# Patient Record
Sex: Female | Born: 1935
Health system: Southern US, Community
[De-identification: ages and names within clinical notes are randomized; demographics above are authoritative.]

## PROBLEM LIST (undated history)

## (undated) DIAGNOSIS — M79606 Pain in leg, unspecified: Secondary | ICD-10-CM

## (undated) DIAGNOSIS — E785 Hyperlipidemia, unspecified: Secondary | ICD-10-CM

## (undated) DIAGNOSIS — M1990 Unspecified osteoarthritis, unspecified site: Secondary | ICD-10-CM

## (undated) DIAGNOSIS — I4891 Unspecified atrial fibrillation: Secondary | ICD-10-CM

## (undated) DIAGNOSIS — I10 Essential (primary) hypertension: Secondary | ICD-10-CM

## (undated) DIAGNOSIS — I82409 Acute embolism and thrombosis of unspecified deep veins of unspecified lower extremity: Secondary | ICD-10-CM

## (undated) DIAGNOSIS — I739 Peripheral vascular disease, unspecified: Secondary | ICD-10-CM

## (undated) DIAGNOSIS — I639 Cerebral infarction, unspecified: Secondary | ICD-10-CM

## (undated) HISTORY — DX: Unspecified atrial fibrillation: I48.91

## (undated) HISTORY — DX: Acute embolism and thrombosis of unspecified deep veins of unspecified lower extremity: I82.409

## (undated) HISTORY — PX: EYE SURGERY: SHX253

## (undated) HISTORY — PX: SPINE SURGERY: SHX786

## (undated) HISTORY — DX: Pain in leg, unspecified: M79.606

## (undated) HISTORY — DX: Cerebral infarction, unspecified: I63.9

## (undated) HISTORY — DX: Hyperlipidemia, unspecified: E78.5

## (undated) HISTORY — DX: Essential (primary) hypertension: I10

## (undated) HISTORY — PX: LUMBAR DISC SURGERY: SHX700

## (undated) HISTORY — DX: Peripheral vascular disease, unspecified: I73.9

---

## 1994-04-15 HISTORY — PX: BACK SURGERY: SHX140

## 1997-04-15 DIAGNOSIS — I639 Cerebral infarction, unspecified: Secondary | ICD-10-CM

## 1997-04-15 HISTORY — DX: Cerebral infarction, unspecified: I63.9

## 1997-11-03 ENCOUNTER — Emergency Department (HOSPITAL_COMMUNITY): Admission: EM | Admit: 1997-11-03 | Discharge: 1997-11-03 | Payer: Self-pay | Admitting: Emergency Medicine

## 1998-04-15 HISTORY — PX: CATARACT EXTRACTION: SUR2

## 1998-08-29 ENCOUNTER — Other Ambulatory Visit: Admission: RE | Admit: 1998-08-29 | Discharge: 1998-08-29 | Payer: Self-pay | Admitting: Family Medicine

## 1999-08-20 ENCOUNTER — Other Ambulatory Visit: Admission: RE | Admit: 1999-08-20 | Discharge: 1999-08-20 | Payer: Self-pay | Admitting: Family Medicine

## 2000-09-17 ENCOUNTER — Other Ambulatory Visit: Admission: RE | Admit: 2000-09-17 | Discharge: 2000-09-17 | Payer: Self-pay | Admitting: Family Medicine

## 2001-10-22 ENCOUNTER — Other Ambulatory Visit: Admission: RE | Admit: 2001-10-22 | Discharge: 2001-10-22 | Payer: Self-pay | Admitting: Family Medicine

## 2001-10-22 ENCOUNTER — Other Ambulatory Visit: Admission: RE | Admit: 2001-10-22 | Discharge: 2001-10-22 | Payer: Self-pay | Admitting: Anesthesiology

## 2001-10-25 ENCOUNTER — Emergency Department (HOSPITAL_COMMUNITY): Admission: EM | Admit: 2001-10-25 | Discharge: 2001-10-25 | Payer: Self-pay

## 2001-10-25 ENCOUNTER — Encounter: Payer: Self-pay | Admitting: Emergency Medicine

## 2003-02-14 ENCOUNTER — Inpatient Hospital Stay (HOSPITAL_COMMUNITY): Admission: EM | Admit: 2003-02-14 | Discharge: 2003-02-16 | Payer: Self-pay | Admitting: Emergency Medicine

## 2003-02-15 ENCOUNTER — Encounter (INDEPENDENT_AMBULATORY_CARE_PROVIDER_SITE_OTHER): Payer: Self-pay | Admitting: Cardiology

## 2003-11-01 ENCOUNTER — Encounter: Payer: Self-pay | Admitting: Cardiovascular Disease

## 2003-11-01 ENCOUNTER — Inpatient Hospital Stay (HOSPITAL_COMMUNITY): Admission: EM | Admit: 2003-11-01 | Discharge: 2003-11-07 | Payer: Self-pay | Admitting: Emergency Medicine

## 2004-02-22 ENCOUNTER — Ambulatory Visit: Payer: Self-pay | Admitting: *Deleted

## 2004-04-19 ENCOUNTER — Ambulatory Visit (HOSPITAL_COMMUNITY): Admission: RE | Admit: 2004-04-19 | Discharge: 2004-04-19 | Payer: Self-pay | Admitting: Gastroenterology

## 2004-04-19 ENCOUNTER — Encounter (INDEPENDENT_AMBULATORY_CARE_PROVIDER_SITE_OTHER): Payer: Self-pay | Admitting: *Deleted

## 2004-04-25 ENCOUNTER — Ambulatory Visit: Payer: Self-pay | Admitting: *Deleted

## 2004-04-26 ENCOUNTER — Ambulatory Visit: Payer: Self-pay | Admitting: Internal Medicine

## 2004-04-26 ENCOUNTER — Ambulatory Visit: Payer: Self-pay | Admitting: *Deleted

## 2004-05-16 ENCOUNTER — Ambulatory Visit: Payer: Self-pay | Admitting: Cardiology

## 2004-06-13 ENCOUNTER — Ambulatory Visit: Payer: Self-pay | Admitting: Cardiology

## 2004-07-11 ENCOUNTER — Ambulatory Visit: Payer: Self-pay | Admitting: Cardiology

## 2004-08-13 ENCOUNTER — Ambulatory Visit: Payer: Self-pay | Admitting: Internal Medicine

## 2004-08-20 ENCOUNTER — Ambulatory Visit: Payer: Self-pay | Admitting: Cardiology

## 2004-09-03 ENCOUNTER — Ambulatory Visit: Payer: Self-pay | Admitting: Cardiovascular Disease

## 2004-09-13 ENCOUNTER — Ambulatory Visit: Payer: Self-pay | Admitting: Cardiology

## 2004-09-27 ENCOUNTER — Ambulatory Visit: Payer: Self-pay | Admitting: Internal Medicine

## 2004-10-25 ENCOUNTER — Ambulatory Visit: Payer: Self-pay | Admitting: Internal Medicine

## 2004-11-26 ENCOUNTER — Ambulatory Visit: Payer: Self-pay | Admitting: Internal Medicine

## 2004-12-10 ENCOUNTER — Ambulatory Visit: Payer: Self-pay | Admitting: Cardiology

## 2004-12-31 ENCOUNTER — Ambulatory Visit: Payer: Self-pay | Admitting: Cardiology

## 2005-01-21 ENCOUNTER — Ambulatory Visit: Payer: Self-pay | Admitting: Internal Medicine

## 2005-02-04 ENCOUNTER — Ambulatory Visit: Payer: Self-pay | Admitting: Internal Medicine

## 2005-03-04 ENCOUNTER — Ambulatory Visit: Payer: Self-pay | Admitting: Cardiology

## 2005-03-18 ENCOUNTER — Ambulatory Visit: Payer: Self-pay | Admitting: Cardiology

## 2005-04-16 ENCOUNTER — Ambulatory Visit: Payer: Self-pay | Admitting: Cardiology

## 2005-05-13 ENCOUNTER — Ambulatory Visit: Payer: Self-pay | Admitting: Cardiology

## 2005-06-03 ENCOUNTER — Ambulatory Visit: Payer: Self-pay | Admitting: Cardiology

## 2005-07-05 ENCOUNTER — Ambulatory Visit: Payer: Self-pay | Admitting: Cardiology

## 2005-08-02 ENCOUNTER — Ambulatory Visit: Payer: Self-pay | Admitting: Internal Medicine

## 2005-08-14 ENCOUNTER — Ambulatory Visit: Payer: Self-pay | Admitting: Cardiology

## 2005-08-19 ENCOUNTER — Ambulatory Visit: Payer: Self-pay | Admitting: *Deleted

## 2005-08-27 ENCOUNTER — Ambulatory Visit: Payer: Self-pay | Admitting: *Deleted

## 2005-08-27 ENCOUNTER — Ambulatory Visit: Payer: Self-pay

## 2005-09-17 ENCOUNTER — Ambulatory Visit: Payer: Self-pay | Admitting: Cardiology

## 2005-10-10 ENCOUNTER — Ambulatory Visit: Payer: Self-pay | Admitting: Internal Medicine

## 2005-11-07 ENCOUNTER — Ambulatory Visit: Payer: Self-pay | Admitting: Internal Medicine

## 2005-12-09 ENCOUNTER — Ambulatory Visit: Payer: Self-pay | Admitting: Cardiology

## 2005-12-30 ENCOUNTER — Ambulatory Visit: Payer: Self-pay | Admitting: Cardiovascular Disease

## 2006-01-10 ENCOUNTER — Ambulatory Visit: Payer: Self-pay | Admitting: Cardiology

## 2006-02-07 ENCOUNTER — Ambulatory Visit: Payer: Self-pay | Admitting: Internal Medicine

## 2006-03-07 ENCOUNTER — Ambulatory Visit: Payer: Self-pay | Admitting: Cardiology

## 2006-03-12 ENCOUNTER — Ambulatory Visit: Payer: Self-pay | Admitting: *Deleted

## 2006-04-04 ENCOUNTER — Ambulatory Visit: Payer: Self-pay | Admitting: Cardiovascular Disease

## 2006-05-02 ENCOUNTER — Ambulatory Visit: Payer: Self-pay | Admitting: Internal Medicine

## 2006-05-30 ENCOUNTER — Ambulatory Visit: Payer: Self-pay | Admitting: Internal Medicine

## 2006-06-13 ENCOUNTER — Ambulatory Visit: Payer: Self-pay | Admitting: *Deleted

## 2006-06-13 LAB — CONVERTED CEMR LAB
ALT: 38 units/L (ref 0–40)
AST: 27 units/L (ref 0–37)
Albumin: 4.2 g/dL (ref 3.5–5.2)
Alkaline Phosphatase: 93 units/L (ref 39–117)
Bilirubin, Direct: 0.1 mg/dL (ref 0.0–0.3)
Cholesterol: 140 mg/dL (ref 0–200)
HDL: 44.8 mg/dL (ref >39.0)
LDL Cholesterol: 82 mg/dL (ref 0–99)
Total Bilirubin: 0.8 mg/dL (ref 0.3–1.2)
Total CHOL/HDL Ratio: 3.1
Total Protein: 7.7 g/dL (ref 6.0–8.3)
Triglycerides: 65 mg/dL (ref 0–149)
VLDL: 13 mg/dL (ref 0–40)

## 2006-06-20 ENCOUNTER — Ambulatory Visit: Payer: Self-pay | Admitting: Cardiology

## 2006-07-18 ENCOUNTER — Ambulatory Visit: Payer: Self-pay | Admitting: Cardiology

## 2006-08-15 ENCOUNTER — Ambulatory Visit: Payer: Self-pay | Admitting: Internal Medicine

## 2006-09-12 ENCOUNTER — Ambulatory Visit: Payer: Self-pay | Admitting: Internal Medicine

## 2006-09-26 ENCOUNTER — Ambulatory Visit: Payer: Self-pay | Admitting: Cardiology

## 2006-10-20 ENCOUNTER — Ambulatory Visit: Payer: Self-pay | Admitting: Cardiology

## 2006-10-30 ENCOUNTER — Ambulatory Visit: Payer: Self-pay | Admitting: Cardiology

## 2006-11-13 ENCOUNTER — Ambulatory Visit: Payer: Self-pay | Admitting: Cardiology

## 2006-11-27 ENCOUNTER — Ambulatory Visit: Payer: Self-pay | Admitting: Internal Medicine

## 2006-12-25 ENCOUNTER — Ambulatory Visit: Payer: Self-pay | Admitting: Internal Medicine

## 2007-01-22 ENCOUNTER — Ambulatory Visit: Payer: Self-pay | Admitting: Cardiology

## 2007-02-09 ENCOUNTER — Ambulatory Visit: Payer: Self-pay | Admitting: Cardiology

## 2007-03-09 ENCOUNTER — Ambulatory Visit: Payer: Self-pay | Admitting: Cardiology

## 2007-04-03 ENCOUNTER — Ambulatory Visit: Payer: Self-pay | Admitting: Internal Medicine

## 2007-04-03 ENCOUNTER — Ambulatory Visit: Payer: Self-pay

## 2007-04-03 LAB — CONVERTED CEMR LAB
ALT: 43 units/L — ABNORMAL HIGH (ref 0–35)
AST: 31 units/L (ref 0–37)
Albumin: 4 g/dL (ref 3.5–5.2)
Alkaline Phosphatase: 81 units/L (ref 39–117)
BUN: 12 mg/dL (ref 6–23)
Basophils Absolute: 0 10*3/uL (ref 0.0–0.1)
Basophils Relative: 0.1 % (ref 0.0–1.0)
Bilirubin, Direct: 0.1 mg/dL (ref 0.0–0.3)
CO2: 30 meq/L (ref 19–32)
Calcium: 9.2 mg/dL (ref 8.4–10.5)
Chloride: 103 meq/L (ref 96–112)
Cholesterol: 127 mg/dL (ref 0–200)
Creatinine, Ser: 0.7 mg/dL (ref 0.4–1.2)
Eosinophils Absolute: 0.1 10*3/uL (ref 0.0–0.6)
Eosinophils Relative: 1.2 % (ref 0.0–5.0)
GFR calc Af Amer: 106 mL/min
GFR calc non Af Amer: 87 mL/min
Glucose, Bld: 144 mg/dL — ABNORMAL HIGH (ref 70–99)
HCT: 38.2 % (ref 36.0–46.0)
HDL: 36.6 mg/dL — ABNORMAL LOW (ref >39.0)
Hemoglobin: 12.7 g/dL (ref 12.0–15.0)
LDL Cholesterol: 79 mg/dL (ref 0–99)
Lymphocytes Relative: 35 % (ref 12.0–46.0)
MCHC: 33.2 g/dL (ref 30.0–36.0)
MCV: 100.2 fL — ABNORMAL HIGH (ref 78.0–100.0)
Monocytes Absolute: 0.8 10*3/uL — ABNORMAL HIGH (ref 0.2–0.7)
Monocytes Relative: 12.4 % — ABNORMAL HIGH (ref 3.0–11.0)
Neutro Abs: 3.2 10*3/uL (ref 1.4–7.7)
Neutrophils Relative %: 51.3 % (ref 43.0–77.0)
Platelets: 190 10*3/uL (ref 150–400)
Potassium: 3.8 meq/L (ref 3.5–5.1)
RBC: 3.81 M/uL — ABNORMAL LOW (ref 3.87–5.11)
RDW: 13 % (ref 11.5–14.6)
Sodium: 140 meq/L (ref 135–145)
Total Bilirubin: 0.6 mg/dL (ref 0.3–1.2)
Total CHOL/HDL Ratio: 3.5
Total Protein: 7 g/dL (ref 6.0–8.3)
Triglycerides: 55 mg/dL (ref 0–149)
VLDL: 11 mg/dL (ref 0–40)
WBC: 6.3 10*3/uL (ref 4.5–10.5)

## 2007-05-01 ENCOUNTER — Ambulatory Visit: Payer: Self-pay | Admitting: Cardiology

## 2007-05-29 ENCOUNTER — Ambulatory Visit: Payer: Self-pay | Admitting: Cardiology

## 2007-06-16 ENCOUNTER — Ambulatory Visit: Payer: Self-pay | Admitting: Internal Medicine

## 2007-06-25 ENCOUNTER — Ambulatory Visit: Payer: Self-pay | Admitting: Cardiology

## 2007-06-25 LAB — CONVERTED CEMR LAB
INR: 4.3 — ABNORMAL HIGH (ref 0.8–1.0)
Prothrombin Time: 26.7 s — ABNORMAL HIGH (ref 10.9–13.3)

## 2007-06-26 ENCOUNTER — Ambulatory Visit (HOSPITAL_COMMUNITY): Admission: RE | Admit: 2007-06-26 | Discharge: 2007-06-26 | Payer: Self-pay | Admitting: Ophthalmology

## 2007-06-29 ENCOUNTER — Ambulatory Visit: Payer: Self-pay | Admitting: Internal Medicine

## 2007-07-03 ENCOUNTER — Ambulatory Visit: Payer: Self-pay | Admitting: Cardiology

## 2007-07-06 ENCOUNTER — Ambulatory Visit: Payer: Self-pay | Admitting: Cardiovascular Disease

## 2007-07-09 ENCOUNTER — Ambulatory Visit (HOSPITAL_COMMUNITY): Admission: RE | Admit: 2007-07-09 | Discharge: 2007-07-09 | Payer: Self-pay | Admitting: Ophthalmology

## 2007-07-20 ENCOUNTER — Ambulatory Visit: Payer: Self-pay | Admitting: Internal Medicine

## 2007-08-03 ENCOUNTER — Ambulatory Visit: Payer: Self-pay | Admitting: Internal Medicine

## 2007-08-17 ENCOUNTER — Ambulatory Visit: Payer: Self-pay | Admitting: Cardiovascular Disease

## 2007-08-27 ENCOUNTER — Ambulatory Visit: Payer: Self-pay | Admitting: Cardiology

## 2007-09-03 ENCOUNTER — Ambulatory Visit: Payer: Self-pay | Admitting: Internal Medicine

## 2007-09-10 ENCOUNTER — Ambulatory Visit (HOSPITAL_COMMUNITY): Admission: RE | Admit: 2007-09-10 | Discharge: 2007-09-10 | Payer: Self-pay | Admitting: Ophthalmology

## 2007-09-16 ENCOUNTER — Ambulatory Visit: Payer: Self-pay | Admitting: Cardiovascular Disease

## 2007-09-30 ENCOUNTER — Ambulatory Visit: Payer: Self-pay | Admitting: Cardiology

## 2007-10-14 ENCOUNTER — Ambulatory Visit: Payer: Self-pay | Admitting: Cardiology

## 2007-10-26 ENCOUNTER — Ambulatory Visit: Payer: Self-pay | Admitting: Internal Medicine

## 2007-10-28 ENCOUNTER — Inpatient Hospital Stay (HOSPITAL_COMMUNITY): Admission: EM | Admit: 2007-10-28 | Discharge: 2007-10-29 | Payer: Self-pay | Admitting: Emergency Medicine

## 2007-10-28 ENCOUNTER — Encounter (INDEPENDENT_AMBULATORY_CARE_PROVIDER_SITE_OTHER): Payer: Self-pay | Admitting: Internal Medicine

## 2007-10-28 ENCOUNTER — Ambulatory Visit: Payer: Self-pay | Admitting: Cardiovascular Disease

## 2007-11-16 ENCOUNTER — Ambulatory Visit: Payer: Self-pay | Admitting: Internal Medicine

## 2007-12-14 ENCOUNTER — Ambulatory Visit: Payer: Self-pay | Admitting: Internal Medicine

## 2008-01-11 ENCOUNTER — Ambulatory Visit: Payer: Self-pay | Admitting: Internal Medicine

## 2008-02-01 ENCOUNTER — Ambulatory Visit: Payer: Self-pay | Admitting: Cardiovascular Disease

## 2008-02-29 ENCOUNTER — Ambulatory Visit: Payer: Self-pay | Admitting: Cardiology

## 2008-03-23 ENCOUNTER — Ambulatory Visit: Payer: Self-pay | Admitting: Cardiology

## 2008-03-28 ENCOUNTER — Ambulatory Visit: Payer: Self-pay | Admitting: Cardiology

## 2008-03-28 ENCOUNTER — Ambulatory Visit: Payer: Self-pay | Admitting: Internal Medicine

## 2008-03-28 LAB — CONVERTED CEMR LAB
ALT: 35 units/L (ref 0–35)
AST: 28 units/L (ref 0–37)
Albumin: 3.8 g/dL (ref 3.5–5.2)
Alkaline Phosphatase: 58 units/L (ref 39–117)
BUN: 11 mg/dL (ref 6–23)
Basophils Absolute: 0 10*3/uL (ref 0.0–0.1)
Basophils Relative: 0.2 % (ref 0.0–3.0)
Bilirubin, Direct: 0.1 mg/dL (ref 0.0–0.3)
CO2: 28 meq/L (ref 19–32)
Calcium: 9.2 mg/dL (ref 8.4–10.5)
Chloride: 106 meq/L (ref 96–112)
Cholesterol: 111 mg/dL (ref 0–200)
Creatinine, Ser: 0.5 mg/dL (ref 0.4–1.2)
Eosinophils Absolute: 0 10*3/uL (ref 0.0–0.7)
Eosinophils Relative: 0.5 % (ref 0.0–5.0)
GFR calc Af Amer: 156 mL/min
GFR calc non Af Amer: 129 mL/min
Glucose, Bld: 180 mg/dL — ABNORMAL HIGH (ref 70–99)
HCT: 34.7 % — ABNORMAL LOW (ref 36.0–46.0)
HDL: 34.6 mg/dL — ABNORMAL LOW (ref >39.0)
Hemoglobin: 11.5 g/dL — ABNORMAL LOW (ref 12.0–15.0)
LDL Cholesterol: 64 mg/dL (ref 0–99)
Lymphocytes Relative: 31.2 % (ref 12.0–46.0)
MCHC: 33.1 g/dL (ref 30.0–36.0)
MCV: 101.4 fL — ABNORMAL HIGH (ref 78.0–100.0)
Monocytes Absolute: 0.7 10*3/uL (ref 0.1–1.0)
Monocytes Relative: 13.8 % — ABNORMAL HIGH (ref 3.0–12.0)
Neutro Abs: 3 10*3/uL (ref 1.4–7.7)
Neutrophils Relative %: 54.3 % (ref 43.0–77.0)
Platelets: 194 10*3/uL (ref 150–400)
Potassium: 3.9 meq/L (ref 3.5–5.1)
RBC: 3.42 M/uL — ABNORMAL LOW (ref 3.87–5.11)
RDW: 13.5 % (ref 11.5–14.6)
Sodium: 141 meq/L (ref 135–145)
Total Bilirubin: 0.6 mg/dL (ref 0.3–1.2)
Total CHOL/HDL Ratio: 3.2
Total Protein: 6.7 g/dL (ref 6.0–8.3)
Triglycerides: 63 mg/dL (ref 0–149)
VLDL: 13 mg/dL (ref 0–40)
WBC: 5.4 10*3/uL (ref 4.5–10.5)

## 2008-03-30 ENCOUNTER — Ambulatory Visit: Payer: Self-pay | Admitting: Cardiology

## 2008-03-30 LAB — CONVERTED CEMR LAB
Folate: >20 ng/mL
Vitamin B-12: 291 pg/mL (ref 211–911)

## 2008-04-25 ENCOUNTER — Ambulatory Visit: Payer: Self-pay | Admitting: Internal Medicine

## 2008-05-17 ENCOUNTER — Ambulatory Visit: Payer: Self-pay | Admitting: Cardiology

## 2008-06-14 ENCOUNTER — Ambulatory Visit: Payer: Self-pay | Admitting: Internal Medicine

## 2008-07-12 ENCOUNTER — Ambulatory Visit: Payer: Self-pay | Admitting: Cardiology

## 2008-08-09 ENCOUNTER — Ambulatory Visit: Payer: Self-pay | Admitting: Cardiology

## 2008-08-15 ENCOUNTER — Ambulatory Visit (HOSPITAL_COMMUNITY): Admission: RE | Admit: 2008-08-15 | Discharge: 2008-08-15 | Payer: Self-pay | Admitting: Ophthalmology

## 2008-08-30 DIAGNOSIS — I4891 Unspecified atrial fibrillation: Secondary | ICD-10-CM | POA: Insufficient documentation

## 2008-08-30 DIAGNOSIS — E119 Type 2 diabetes mellitus without complications: Secondary | ICD-10-CM

## 2008-08-30 DIAGNOSIS — R55 Syncope and collapse: Secondary | ICD-10-CM

## 2008-08-30 DIAGNOSIS — E785 Hyperlipidemia, unspecified: Secondary | ICD-10-CM

## 2008-08-30 DIAGNOSIS — E1169 Type 2 diabetes mellitus with other specified complication: Secondary | ICD-10-CM | POA: Insufficient documentation

## 2008-09-06 ENCOUNTER — Ambulatory Visit: Payer: Self-pay | Admitting: Cardiology

## 2008-09-13 ENCOUNTER — Encounter: Payer: Self-pay | Admitting: *Deleted

## 2008-09-27 ENCOUNTER — Ambulatory Visit: Payer: Self-pay | Admitting: Internal Medicine

## 2008-09-27 ENCOUNTER — Encounter (INDEPENDENT_AMBULATORY_CARE_PROVIDER_SITE_OTHER): Payer: Self-pay | Admitting: Cardiology

## 2008-09-27 LAB — CONVERTED CEMR LAB
POC INR: 1.9
Protime: 17

## 2008-10-19 ENCOUNTER — Encounter: Payer: Self-pay | Admitting: *Deleted

## 2008-10-25 ENCOUNTER — Encounter (INDEPENDENT_AMBULATORY_CARE_PROVIDER_SITE_OTHER): Payer: Self-pay | Admitting: Cardiology

## 2008-10-25 ENCOUNTER — Ambulatory Visit: Payer: Self-pay | Admitting: Cardiovascular Disease

## 2008-10-25 LAB — CONVERTED CEMR LAB
POC INR: 2.7
Prothrombin Time: 19.9 s

## 2008-10-26 ENCOUNTER — Telehealth: Payer: Self-pay | Admitting: Cardiology

## 2008-11-16 ENCOUNTER — Telehealth: Payer: Self-pay | Admitting: Cardiology

## 2008-11-22 ENCOUNTER — Ambulatory Visit: Payer: Self-pay | Admitting: Internal Medicine

## 2008-11-22 LAB — CONVERTED CEMR LAB
POC INR: 1.8
Prothrombin Time: 16.5 s

## 2008-12-13 ENCOUNTER — Ambulatory Visit: Payer: Self-pay | Admitting: Cardiovascular Disease

## 2008-12-13 LAB — CONVERTED CEMR LAB: POC INR: 4.8

## 2008-12-23 ENCOUNTER — Ambulatory Visit: Payer: Self-pay | Admitting: Internal Medicine

## 2008-12-23 LAB — CONVERTED CEMR LAB: POC INR: 3.8

## 2009-01-06 ENCOUNTER — Ambulatory Visit: Payer: Self-pay | Admitting: Internal Medicine

## 2009-01-06 LAB — CONVERTED CEMR LAB: POC INR: 1.7

## 2009-01-09 ENCOUNTER — Encounter (INDEPENDENT_AMBULATORY_CARE_PROVIDER_SITE_OTHER): Payer: Self-pay | Admitting: *Deleted

## 2009-01-20 ENCOUNTER — Ambulatory Visit: Payer: Self-pay | Admitting: Internal Medicine

## 2009-01-20 LAB — CONVERTED CEMR LAB: POC INR: 4

## 2009-01-31 ENCOUNTER — Ambulatory Visit: Payer: Self-pay | Admitting: Internal Medicine

## 2009-01-31 LAB — CONVERTED CEMR LAB: POC INR: 2

## 2009-02-21 ENCOUNTER — Ambulatory Visit: Payer: Self-pay | Admitting: Cardiology

## 2009-02-21 LAB — CONVERTED CEMR LAB: POC INR: 2.8

## 2009-03-21 ENCOUNTER — Ambulatory Visit: Payer: Self-pay | Admitting: Internal Medicine

## 2009-03-21 LAB — CONVERTED CEMR LAB: POC INR: 2.3

## 2009-03-28 ENCOUNTER — Ambulatory Visit: Payer: Self-pay | Admitting: Cardiology

## 2009-03-28 DIAGNOSIS — I10 Essential (primary) hypertension: Secondary | ICD-10-CM | POA: Insufficient documentation

## 2009-03-29 ENCOUNTER — Ambulatory Visit: Payer: Self-pay | Admitting: Cardiology

## 2009-03-29 DIAGNOSIS — I10 Essential (primary) hypertension: Secondary | ICD-10-CM | POA: Insufficient documentation

## 2009-03-29 DIAGNOSIS — E7800 Pure hypercholesterolemia, unspecified: Secondary | ICD-10-CM | POA: Insufficient documentation

## 2009-04-06 ENCOUNTER — Encounter (INDEPENDENT_AMBULATORY_CARE_PROVIDER_SITE_OTHER): Payer: Self-pay | Admitting: *Deleted

## 2009-04-12 LAB — CONVERTED CEMR LAB
ALT: 49 units/L — ABNORMAL HIGH (ref 0–35)
AST: 32 units/L (ref 0–37)
Albumin: 4 g/dL (ref 3.5–5.2)
Alkaline Phosphatase: 77 units/L (ref 39–117)
BUN: 11 mg/dL (ref 6–23)
Basophils Absolute: 0 10*3/uL (ref 0.0–0.1)
Basophils Relative: 0.3 % (ref 0.0–3.0)
Bilirubin, Direct: 0 mg/dL (ref 0.0–0.3)
CO2: 30 meq/L (ref 19–32)
Calcium: 9.3 mg/dL (ref 8.4–10.5)
Chloride: 106 meq/L (ref 96–112)
Cholesterol: 124 mg/dL (ref 0–200)
Creatinine, Ser: 0.6 mg/dL (ref 0.4–1.2)
Eosinophils Absolute: 0.1 10*3/uL (ref 0.0–0.7)
Eosinophils Relative: 1.2 % (ref 0.0–5.0)
GFR calc non Af Amer: 125.62 mL/min (ref >60)
Glucose, Bld: 163 mg/dL — ABNORMAL HIGH (ref 70–99)
HCT: 36.8 % (ref 36.0–46.0)
HDL: 37.4 mg/dL — ABNORMAL LOW (ref >39.00)
Hemoglobin: 12.5 g/dL (ref 12.0–15.0)
LDL Cholesterol: 76 mg/dL (ref 0–99)
Lymphocytes Relative: 41 % (ref 12.0–46.0)
Lymphs Abs: 2.5 10*3/uL (ref 0.7–4.0)
MCHC: 33.9 g/dL (ref 30.0–36.0)
MCV: 102.7 fL — ABNORMAL HIGH (ref 78.0–100.0)
Monocytes Absolute: 0.7 10*3/uL (ref 0.1–1.0)
Monocytes Relative: 12.4 % — ABNORMAL HIGH (ref 3.0–12.0)
Neutro Abs: 2.7 10*3/uL (ref 1.4–7.7)
Neutrophils Relative %: 45.1 % (ref 43.0–77.0)
Platelets: 165 10*3/uL (ref 150.0–400.0)
Potassium: 3.9 meq/L (ref 3.5–5.1)
RBC: 3.58 M/uL — ABNORMAL LOW (ref 3.87–5.11)
RDW: 13.9 % (ref 11.5–14.6)
Sodium: 144 meq/L (ref 135–145)
Total Bilirubin: 0.7 mg/dL (ref 0.3–1.2)
Total CHOL/HDL Ratio: 3
Total Protein: 7.3 g/dL (ref 6.0–8.3)
Triglycerides: 52 mg/dL (ref 0.0–149.0)
VLDL: 10.4 mg/dL (ref 0.0–40.0)
WBC: 6 10*3/uL (ref 4.5–10.5)

## 2009-04-18 ENCOUNTER — Ambulatory Visit: Payer: Self-pay | Admitting: Cardiovascular Disease

## 2009-04-18 LAB — CONVERTED CEMR LAB: POC INR: 2.6

## 2009-05-16 ENCOUNTER — Ambulatory Visit: Payer: Self-pay | Admitting: Internal Medicine

## 2009-06-13 ENCOUNTER — Ambulatory Visit: Payer: Self-pay | Admitting: Internal Medicine

## 2009-06-13 LAB — CONVERTED CEMR LAB: POC INR: 2.7

## 2009-07-05 ENCOUNTER — Ambulatory Visit: Payer: Self-pay | Admitting: Cardiology

## 2009-07-05 LAB — CONVERTED CEMR LAB
ALT: 39 units/L — ABNORMAL HIGH (ref 0–35)
AST: 29 units/L (ref 0–37)
Bilirubin, Direct: 0 mg/dL (ref 0.0–0.3)
Total Bilirubin: 0.4 mg/dL (ref 0.3–1.2)

## 2009-07-11 ENCOUNTER — Ambulatory Visit: Payer: Self-pay | Admitting: Cardiology

## 2009-07-11 LAB — CONVERTED CEMR LAB: POC INR: 3.5

## 2009-07-28 ENCOUNTER — Ambulatory Visit: Payer: Self-pay | Admitting: Internal Medicine

## 2009-07-28 LAB — CONVERTED CEMR LAB: POC INR: 2.3

## 2009-08-18 ENCOUNTER — Ambulatory Visit: Payer: Self-pay | Admitting: Cardiology

## 2009-09-15 ENCOUNTER — Ambulatory Visit: Payer: Self-pay | Admitting: Cardiovascular Disease

## 2009-09-15 LAB — CONVERTED CEMR LAB: POC INR: 3.4

## 2009-09-19 ENCOUNTER — Ambulatory Visit (HOSPITAL_COMMUNITY): Admission: RE | Admit: 2009-09-19 | Discharge: 2009-09-19 | Payer: Self-pay | Admitting: Ophthalmology

## 2009-10-09 ENCOUNTER — Ambulatory Visit: Payer: Self-pay | Admitting: Cardiology

## 2009-10-09 LAB — CONVERTED CEMR LAB: POC INR: 4.2

## 2009-10-23 ENCOUNTER — Ambulatory Visit: Payer: Self-pay | Admitting: Cardiology

## 2009-10-23 LAB — CONVERTED CEMR LAB: POC INR: 2

## 2009-11-13 ENCOUNTER — Ambulatory Visit: Payer: Self-pay | Admitting: Internal Medicine

## 2009-12-04 ENCOUNTER — Ambulatory Visit: Payer: Self-pay | Admitting: Internal Medicine

## 2009-12-26 ENCOUNTER — Encounter: Admission: RE | Admit: 2009-12-26 | Discharge: 2009-12-26 | Payer: Self-pay | Admitting: Neurology

## 2010-01-04 ENCOUNTER — Ambulatory Visit: Payer: Self-pay | Admitting: Cardiology

## 2010-01-24 ENCOUNTER — Ambulatory Visit: Payer: Self-pay | Admitting: Cardiovascular Disease

## 2010-03-01 ENCOUNTER — Ambulatory Visit: Payer: Self-pay

## 2010-03-29 ENCOUNTER — Ambulatory Visit: Payer: Self-pay | Admitting: Internal Medicine

## 2010-03-29 LAB — CONVERTED CEMR LAB: POC INR: 2

## 2010-04-03 ENCOUNTER — Ambulatory Visit: Payer: Self-pay | Admitting: Cardiology

## 2010-04-03 ENCOUNTER — Encounter: Payer: Self-pay | Admitting: Cardiology

## 2010-04-26 ENCOUNTER — Ambulatory Visit: Admission: RE | Admit: 2010-04-26 | Discharge: 2010-04-26 | Payer: Self-pay | Source: Home / Self Care

## 2010-04-26 LAB — CONVERTED CEMR LAB: POC INR: 1.9

## 2010-05-10 ENCOUNTER — Ambulatory Visit: Admission: RE | Admit: 2010-05-10 | Discharge: 2010-05-10 | Payer: Self-pay | Source: Home / Self Care

## 2010-05-10 LAB — CONVERTED CEMR LAB: POC INR: 1.9

## 2010-05-15 NOTE — Medication Information (Signed)
Summary: rov/ewj  Anticoagulant Therapy  Managed by: Weston Brass, PharmD Referring MD: Olga Millers MD PCP: Dr Parke Simmers Supervising MD: Gala Romney MD, Reuel Boom Indication 1: Atrial Fibrillation (ICD-427.31) Lab Used: [facility] Site: [facility] INR POC 1.9 INR RANGE 2 - 3  Dietary changes: no    Health status changes: no    Bleeding/hemorrhagic complications: no    Recent/future hospitalizations: no    Any changes in medication regimen? no    Recent/future dental: no  Any missed doses?: no       Is patient compliant with meds? yes       Allergies: No Known Drug Allergies  Anticoagulation Management History:      The patient is taking warfarin and comes in today for a routine follow up visit.  Positive risk factors for bleeding include an age of 75 years or older and presence of serious comorbidities.  The bleeding index is 'intermediate risk'.  Positive CHADS2 values include History of HTN and History of Diabetes.  Negative CHADS2 values include Age > 75 years old.  The start date was 11/14/2003.  Her last INR was 4.3 RATIO.  Anticoagulation responsible provider: Bensimhon MD, Reuel Boom.  INR POC: 1.9.  Cuvette Lot#: [id number].  Exp: 01/2011.    Anticoagulation Management Assessment/Plan:      The patient's current anticoagulation dose is Warfarin sodium 5 mg tabs: take as directed.  The target INR is 2 - 3.  The next INR is due 12/04/2009.  Anticoagulation instructions were given to patient.  Results were reviewed/authorized by Weston Brass, PharmD.  She was notified by Weston Brass PharmD.         Prior Anticoagulation Instructions: INR 2.0  Take 1.5 tablets today, then resume same dosage 1/2 tablet daily except 1 tablet on Mondays, Wednesdays,a nd Fridays.  Recheck in 3 weeks.    Current Anticoagulation Instructions: INR 1.9  Change dose to 1 tablet every day except 1/2 tablet on Sunday, Tuesday and Thursday.

## 2010-05-15 NOTE — Medication Information (Signed)
Summary: rov/tm  Anticoagulant Therapy  Managed by: Cloyde Reams, RN, BSN Referring MD: Olga Millers MD PCP: Dr Para March MD: Shirlee Latch MD, Madison Direnzo Indication 1: Atrial Fibrillation (ICD-427.31) Lab Used: [facility] Site: [facility] INR POC 2.0 INR RANGE 2 - 3  Dietary changes: no    Health status changes: no    Bleeding/hemorrhagic complications: no    Recent/future hospitalizations: no    Any changes in medication regimen? no    Recent/future dental: no  Any missed doses?: no       Is patient compliant with meds? yes       Allergies: No Known Drug Allergies  Anticoagulation Management History:      The patient is taking warfarin and comes in today for a routine follow up visit.  Positive risk factors for bleeding include an age of 75 years or older and presence of serious comorbidities.  The bleeding index is 'intermediate risk'.  Positive CHADS2 values include History of HTN and History of Diabetes.  Negative CHADS2 values include Age > 75 years old.  The start date was 11/14/2003.  Her last INR was 4.3 RATIO.  Anticoagulation responsible provider: Shirlee Latch MD, Lisaann Atha.  INR POC: 2.0.  Cuvette Lot#: [id number].  Exp: 12/2010.    Anticoagulation Management Assessment/Plan:      The patient's current anticoagulation dose is Warfarin sodium 5 mg tabs: take as directed.  The target INR is 2 - 3.  The next INR is due 11/13/2009.  Anticoagulation instructions were given to patient.  Results were reviewed/authorized by Cloyde Reams, RN, BSN.  She was notified by Cloyde Reams RN.         Prior Anticoagulation Instructions: INR 4.2 Skip today and Tuesdays dose  then change dose to 2.5mg  everyday except 5mg s on Mondays, Wednesdays and Fridays. Recheck in 2 weeks.   Current Anticoagulation Instructions: INR 2.0  Take 1.5 tablets today, then resume same dosage 1/2 tablet daily except 1 tablet on Mondays, Wednesdays,a nd Fridays.  Recheck in 3 weeks.

## 2010-05-15 NOTE — Medication Information (Signed)
Summary: rov/ewj  Anticoagulant Therapy  Managed by: Cloyde Reams, RN, BSN Referring MD: Olga Millers MD Supervising MD: Gala Romney MD, Reuel Boom Indication 1: Atrial Fibrillation (ICD-427.31) Lab Used: [facility] Site: [facility] INR POC 1.9 INR RANGE 2 - 3  Dietary changes: no    Health status changes: no    Bleeding/hemorrhagic complications: no    Recent/future hospitalizations: no    Any changes in medication regimen? no    Recent/future dental: no  Any missed doses?: no       Is patient compliant with meds? yes       Allergies (verified): No Known Drug Allergies  Anticoagulation Management History:      The patient is taking warfarin and comes in today for a routine follow up visit.  Positive risk factors for bleeding include an age of 22 years or older and presence of serious comorbidities.  The bleeding index is 'intermediate risk'.  Positive CHADS2 values include History of HTN and History of Diabetes.  Negative CHADS2 values include Age > 58 years old.  The start date was 11/14/2003.  Her last INR was 4.3 RATIO.  Anticoagulation responsible provider: Dalynn Jhaveri MD, Reuel Boom.  INR POC: 1.9.  Cuvette Lot#: [id number].  Exp: 07/2010.    Anticoagulation Management Assessment/Plan:      The patient's current anticoagulation dose is Warfarin sodium 5 mg tabs: take as directed.  The target INR is 2 - 3.  The next INR is due 06/13/2009.  Anticoagulation instructions were given to patient.  Results were reviewed/authorized by Cloyde Reams, RN, BSN.  She was notified by Cloyde Reams RN.         Prior Anticoagulation Instructions: INR 2.6  Continue on same dosage 1 tablet daily except 1/2 tablet on Mondays, Wednesdays and Fridays.  Recheck in 4 weeks.    Current Anticoagulation Instructions: INR 1.9  Take 1.5 tablets today then resume same dosage 1 tablet daily except 1/2 tablet on Mondays, Wednesdays, and Fridays.   Recheck in 4 weeks.

## 2010-05-15 NOTE — Medication Information (Signed)
Summary: rov/tm  Anticoagulant Therapy  Managed by: Bethena Midget, RN, BSN Referring MD: Olga Millers MD PCP: Dr Para March MD: Tenny Craw MD, Gunnar Fusi Indication 1: Atrial Fibrillation (ICD-427.31) Lab Used: [facility] Site: [facility] INR POC 2.3 INR RANGE 2 - 3           Allergies: No Known Drug Allergies  Anticoagulation Management History:      The patient is taking warfarin and comes in today for a routine follow up visit.  Positive risk factors for bleeding include an age of 75 years or older and presence of serious comorbidities.  The bleeding index is 'intermediate risk'.  Positive CHADS2 values include History of HTN and History of Diabetes.  Negative CHADS2 values include Age > 75 years old.  The start date was 11/14/2003.  Her last INR was 4.3 RATIO.  Anticoagulation responsible provider: Tenny Craw MD, Gunnar Fusi.  INR POC: 2.3.  Cuvette Lot#: [id number].  Exp: 08/2010.    Anticoagulation Management Assessment/Plan:      The patient's current anticoagulation dose is Warfarin sodium 5 mg tabs: take as directed.  The target INR is 2 - 3.  The next INR is due 08/18/2009.  Anticoagulation instructions were given to patient.  Results were reviewed/authorized by Bethena Midget, RN, BSN.  She was notified by Bethena Midget, RN, BSN.         Prior Anticoagulation Instructions: INR  3.5 Skip today's dose. Then resume 1 pill everyday except 1/2 pill on Mondays, Wednesdays and Fridays. Recheck in 2-3 weeks.   Current Anticoagulation Instructions: INR 2.3 Continue 5mg s everyday except 2.5mg s on Mondays, Wednesdays and Fridays. Recheck in 3 weeks.  Appended Document: [facility]    Anticoagulant Therapy  Managed by: Bethena Midget, RN, BSN Referring MD: Olga Millers MD PCP: Dr Para March MD: Tenny Craw MD, Gunnar Fusi Indication 1: Atrial Fibrillation (ICD-427.31) Lab Used: [facility] Site: [facility] INR RANGE 2 - 3  Dietary changes: no    Health status changes: no     Bleeding/hemorrhagic complications: no    Recent/future hospitalizations: no    Any changes in medication regimen? no    Recent/future dental: no  Any missed doses?: no       Is patient compliant with meds? yes       Allergies: No Known Drug Allergies  Anticoagulation Management History:      Positive risk factors for bleeding include an age of 75 years or older and presence of serious comorbidities.  The bleeding index is 'intermediate risk'.  Positive CHADS2 values include History of HTN and History of Diabetes.  Negative CHADS2 values include Age > 75 years old.  The start date was 11/14/2003.  Her last INR was 4.3 RATIO.  Anticoagulation responsible provider: Tenny Craw MD, Gunnar Fusi.  Exp: 08/2010.    Anticoagulation Management Assessment/Plan:      The patient's current anticoagulation dose is Warfarin sodium 5 mg tabs: take as directed.  The target INR is 2 - 3.  The next INR is due 08/18/2009.  Anticoagulation instructions were given to patient.  Results were reviewed/authorized by Bethena Midget, RN, BSN.         Prior Anticoagulation Instructions: INR 2.3 Continue 5mg s everyday except 2.5mg s on Mondays, Wednesdays and Fridays. Recheck in 3 weeks.

## 2010-05-15 NOTE — Medication Information (Signed)
Summary: rov/ewj  Anticoagulant Therapy  Managed by: Cloyde Reams, RN, BSN Referring MD: Olga Millers MD PCP: Dr Para March MD: Gala Romney MD, Reuel Boom Indication 1: Atrial Fibrillation (ICD-427.31) Lab Used: [facility] Site: [facility] INR POC 2.7 INR RANGE 2 - 3  Dietary changes: no    Health status changes: no    Bleeding/hemorrhagic complications: no    Recent/future hospitalizations: no    Any changes in medication regimen? yes       Details: Updated med list.  Recent/future dental: no  Any missed doses?: no       Is patient compliant with meds? yes       Current Medications (verified): 1)  Warfarin Sodium 5 Mg Tabs (Warfarin Sodium) .... Take As Directed 2)  Lipitor 40 Mg Tabs (Atorvastatin Calcium) .... Take 1 Tablet By Mouth Once A Day 3)  Metformin Hcl 1000 Mg Tabs (Metformin Hcl) .... Take 1 Tablet By Mouth Two Times A Day 4)  Amlodipine Besylate 10 Mg Tabs (Amlodipine Besylate) .Marland Kitchen.. 1 Tablet By Mouth Once A Day 5)  Glipizide 10 Mg Tabs (Glipizide) .... Two  Tablet By Mouth Twice A Day 6)  Lisinopril 10 Mg Tabs (Lisinopril) .Marland Kitchen.. 1 Tablet By Mouth Once A Day 7)  Fish Oil 1000 Mg Caps (Omega-3 Fatty Acids) .Marland Kitchen.. 1 Tablet By Mouth Once Day 8)  Multivitamins  Caps (Multiple Vitamin) .Marland Kitchen.. 1 Daily 9)  Aspir-Low 81 Mg Tbec (Aspirin) .Marland Kitchen.. 1 Daily 10)  Coreg 6.25 Mg Tabs (Carvedilol) .... Take 1 Tablet By Mouth Two Times A Day  Allergies (verified): No Known Drug Allergies  Anticoagulation Management History:      The patient is taking warfarin and comes in today for a routine follow up visit.  Positive risk factors for bleeding include an age of 75 years or older and presence of serious comorbidities.  The bleeding index is 'intermediate risk'.  Positive CHADS2 values include History of HTN and History of Diabetes.  Negative CHADS2 values include Age > 75 years old.  The start date was 11/14/2003.  Her last INR was 4.3 RATIO.  Anticoagulation responsible  provider: Bensimhon MD, Reuel Boom.  INR POC: 2.7.  Cuvette Lot#: [id number].  Exp: 08/2010.    Anticoagulation Management Assessment/Plan:      The patient's current anticoagulation dose is Warfarin sodium 5 mg tabs: take as directed.  The target INR is 2 - 3.  The next INR is due 07/11/2009.  Anticoagulation instructions were given to patient.  Results were reviewed/authorized by Cloyde Reams, RN, BSN.  She was notified by Cloyde Reams RN.         Prior Anticoagulation Instructions: INR 1.9  Take 1.5 tablets today then resume same dosage 1 tablet daily except 1/2 tablet on Mondays, Wednesdays, and Fridays.   Recheck in 4 weeks.    Current Anticoagulation Instructions: INR 2.7  Continue on same dosage 1 tablet daily except 1/2 tablet on Mondays, Wednesdays, and Fridays.  Recheck in 4 weeks.

## 2010-05-15 NOTE — Medication Information (Signed)
Summary: rov/sp  Anticoagulant Therapy  Managed by: Bethena Midget, RN, BSN Referring MD: Olga Millers MD PCP: Dr Para March MD: Shirlee Latch MD, Miasia Crabtree Indication 1: Atrial Fibrillation (ICD-427.31) Lab Used: [facility] Site: [facility] INR POC 4.2 INR RANGE 2 - 3  Dietary changes: no    Health status changes: yes       Details: Report numbness in Left hand fingers and numbness around left side of mouth, pt statees this comes and goes for past few mths.   Bleeding/hemorrhagic complications: no    Recent/future hospitalizations: no    Any changes in medication regimen? yes       Details: Yesterday actually took 2 doses.   Recent/future dental: no  Any missed doses?: no       Is patient compliant with meds? yes      Comments: Laser Sx on Rt eye 2 weeks ago.   Allergies: No Known Drug Allergies  Anticoagulation Management History:      The patient is taking warfarin and comes in today for a routine follow up visit.  Positive risk factors for bleeding include an age of 75 years or older and presence of serious comorbidities.  The bleeding index is 'intermediate risk'.  Positive CHADS2 values include History of HTN and History of Diabetes.  Negative CHADS2 values include Age > 75 years old.  The start date was 11/14/2003.  Her last INR was 4.3 RATIO.  Anticoagulation responsible provider: Shirlee Latch MD, Kaydie Petsch.  INR POC: 4.2.  Cuvette Lot#: [id number].  Exp: 11/2010.    Anticoagulation Management Assessment/Plan:      The patient's current anticoagulation dose is Warfarin sodium 5 mg tabs: take as directed.  The target INR is 2 - 3.  The next INR is due 10/23/2009.  Anticoagulation instructions were given to patient.  Results were reviewed/authorized by Bethena Midget, RN, BSN.  She was notified by Bethena Midget, RN, BSN.         Prior Anticoagulation Instructions: INR 3.4  Skip today then resume same dose of 1 tablet every day except 1/2 tablet on Monday, Wednesday and Friday    Current Anticoagulation Instructions: INR 4.2 Skip today and Tuesdays dose  then change dose to 2.5mg  everyday except 5mg s on Mondays, Wednesdays and Fridays. Recheck in 2 weeks.

## 2010-05-15 NOTE — Medication Information (Signed)
Summary: [facility]  Anticoagulant Therapy  Managed by: Bethena Midget, RN, BSN Referring MD: Olga Millers MD PCP: Dr Para March MD: Jens Som MD, Arlys John Indication 1: Atrial Fibrillation (ICD-427.31) Lab Used: [facility] Site: [facility] INR POC 1.9 INR RANGE 2 - 3  Dietary changes: no    Health status changes: no    Bleeding/hemorrhagic complications: no    Recent/future hospitalizations: no    Any changes in medication regimen? no    Recent/future dental: no  Any missed doses?: no       Is patient compliant with meds? yes       Allergies: No Known Drug Allergies  Anticoagulation Management History:      The patient is taking warfarin and comes in today for a routine follow up visit.  Positive risk factors for bleeding include an age of 75 years or older and presence of serious comorbidities.  The bleeding index is 'intermediate risk'.  Positive CHADS2 values include History of HTN and History of Diabetes.  Negative CHADS2 values include Age > 63 years old.  The start date was 11/14/2003.  Her last INR was 4.3 RATIO.  Anticoagulation responsible provider: Jens Som MD, Arlys John.  INR POC: 1.9.  Exp: 02/2011.    Anticoagulation Management Assessment/Plan:      The patient's current anticoagulation dose is Warfarin sodium 5 mg tabs: take as directed.  The target INR is 2 - 3.  The next INR is due 03/29/2010.  Anticoagulation instructions were given to patient.  Results were reviewed/authorized by Bethena Midget, RN, BSN.  She was notified by Bethena Midget, RN, BSN.         Prior Anticoagulation Instructions: INR 1.8  Take 1 and 1/2 tablets today, then resume normal schedule of Coumadin 1 tablet on Monday, Wednesday, Friday, and Saturday, and 1/2 tablet on Sunday, Tuesday, and Thursay.    Return to clinic in 4 weeks after your vacation.    Current Anticoagulation Instructions: INR 1.9 Today take 1 pill then resume 1 pill everyday except 1/2 pill on Tuesdays,   Thursdays and Sundays. Recheck in 4 weeks.

## 2010-05-15 NOTE — Medication Information (Signed)
Summary: rov/mw  Anticoagulant Therapy  Managed by: Weston Brass, PharmD Referring MD: Olga Millers MD PCP: Dr Para March MD: Eden Emms MD, Theron Arista Indication 1: Atrial Fibrillation (ICD-427.31) Lab Used: [facility] Site: [facility] INR POC 2.3 INR RANGE 2 - 3  Dietary changes: no    Health status changes: no    Bleeding/hemorrhagic complications: no    Recent/future hospitalizations: no    Any changes in medication regimen? yes       Details: Pt has started topiramate 15mg  sprinkle capsules BID     Comments: Would prefer to see patient in 3 weeks, but she will be on vacation so will have to make appointment for 4 weeks.    Allergies: No Known Drug Allergies  Anticoagulation Management History:      The patient is taking warfarin and comes in today for a routine follow up visit.  Positive risk factors for bleeding include an age of 75 years or older and presence of serious comorbidities.  The bleeding index is 'intermediate risk'.  Positive CHADS2 values include History of HTN and History of Diabetes.  Negative CHADS2 values include Age > 15 years old.  The start date was 11/14/2003.  Her last INR was 4.3 RATIO.  Anticoagulation responsible provider: Eden Emms MD, Theron Arista.  INR POC: 2.3.  Cuvette Lot#: [id number].  Exp: 02/2011.    Anticoagulation Management Assessment/Plan:      The patient's current anticoagulation dose is Warfarin sodium 5 mg tabs: take as directed.  The target INR is 2 - 3.  The next INR is due 02/23/2010.  Anticoagulation instructions were given to patient.  Results were reviewed/authorized by Weston Brass, PharmD.  She was notified by Haynes Hoehn, PharmD Candidate.         Prior Anticoagulation Instructions: INR 3 Increase the amount of green leafy vegetables, you're eating. Continue taking a half tablet on sunday, tuesday, and thursday. And 1 tablet all other days. Recheck in 5 weeks.  Current Anticoagulation Instructions: INR 1.8  Take 1 and 1/2  tablets today, then resume normal schedule of Coumadin 1 tablet on Monday, Wednesday, Friday, and Saturday, and 1/2 tablet on Sunday, Tuesday, and Thursay.    Return to clinic in 4 weeks after your vacation.

## 2010-05-15 NOTE — Medication Information (Signed)
Summary: rov/eac  Anticoagulant Therapy  Managed by: Weston Brass, PharmD Referring MD: Olga Millers MD PCP: Dr Para March MD: Eden Emms MD, Theron Arista Indication 1: Atrial Fibrillation (ICD-427.31) Lab Used: [facility] Site: [facility] INR POC 3.4 INR RANGE 2 - 3  Dietary changes: yes       Details: ate less greens; more salads than other greens this time  Health status changes: yes       Details: having cataract surgery on Tuesday  Bleeding/hemorrhagic complications: no    Recent/future hospitalizations: no    Any changes in medication regimen? no    Recent/future dental: no  Any missed doses?: no       Is patient compliant with meds? yes       Allergies: No Known Drug Allergies  Anticoagulation Management History:      The patient is taking warfarin and comes in today for a routine follow up visit.  Positive risk factors for bleeding include an age of 75 years or older and presence of serious comorbidities.  The bleeding index is 'intermediate risk'.  Positive CHADS2 values include History of HTN and History of Diabetes.  Negative CHADS2 values include Age > 78 years old.  The start date was 11/14/2003.  Her last INR was 4.3 RATIO.  Anticoagulation responsible provider: Eden Emms MD, Theron Arista.  INR POC: 3.4.  Cuvette Lot#: [id number].  Exp: 11/2010.    Anticoagulation Management Assessment/Plan:      The patient's current anticoagulation dose is Warfarin sodium 5 mg tabs: take as directed.  The target INR is 2 - 3.  The next INR is due 10/06/2009.  Anticoagulation instructions were given to patient.  Results were reviewed/authorized by Weston Brass, PharmD.  She was notified by Weston Brass PharmD.         Prior Anticoagulation Instructions: INR 2.7  Continue taking 1/2 tablet on Monday, Wednesday, and Friday and 1 tablet all other days.  Return to clinic in 4 weeks.   Current Anticoagulation Instructions: INR 3.4  Skip today then resume same dose of 1 tablet every day except  1/2 tablet on Monday, Wednesday and Friday

## 2010-05-15 NOTE — Medication Information (Signed)
Summary: rov/sp  Anticoagulant Therapy  Managed by: Weston Brass, PharmD Referring MD: Olga Millers MD PCP: Dr Parke Simmers Supervising MD: Gala Romney MD, Reuel Boom Indication 1: Atrial Fibrillation (ICD-427.31) Lab Used: [facility] Site: [facility] INR POC 2.8 INR RANGE 2 - 3  Dietary changes: no    Health status changes: no    Bleeding/hemorrhagic complications: no    Recent/future hospitalizations: no    Any changes in medication regimen? no    Recent/future dental: no  Any missed doses?: no       Is patient compliant with meds? yes       Allergies: No Known Drug Allergies  Anticoagulation Management History:      The patient is taking warfarin and comes in today for a routine follow up visit.  Positive risk factors for bleeding include an age of 75 years or older and presence of serious comorbidities.  The bleeding index is 'intermediate risk'.  Positive CHADS2 values include History of HTN and History of Diabetes.  Negative CHADS2 values include Age > 75 years old.  The start date was 11/14/2003.  Her last INR was 4.3 RATIO.  Anticoagulation responsible provider: Bensimhon MD, Reuel Boom.  INR POC: 2.8.  Cuvette Lot#: [id number].  Exp: 01/2011.    Anticoagulation Management Assessment/Plan:      The patient's current anticoagulation dose is Warfarin sodium 5 mg tabs: take as directed.  The target INR is 2 - 3.  The next INR is due 01/01/2010.  Anticoagulation instructions were given to patient.  Results were reviewed/authorized by Weston Brass, PharmD.  She was notified by Weston Brass PharmD.         Prior Anticoagulation Instructions: INR 1.9  Change dose to 1 tablet every day except 1/2 tablet on Sunday, Tuesday and Thursday.   Current Anticoagulation Instructions: INR 2.8  Continue same dose of 1 tablet every day except 1/2 tablet on Sunday, Tuesday and Thursday.   Recheck INR in 4 weeks.

## 2010-05-15 NOTE — Medication Information (Signed)
Summary: rov/tm  Anticoagulant Therapy  Managed by: Eda Keys, PharmD Referring MD: Olga Millers MD PCP: Dr Para March MD: Jens Som MD, Arlys John Indication 1: Atrial Fibrillation (ICD-427.31) Lab Used: [facility] Site: [facility] INR POC 2.7 INR RANGE 2 - 3  Dietary changes: no    Health status changes: no    Bleeding/hemorrhagic complications: no    Recent/future hospitalizations: no    Any changes in medication regimen? no    Recent/future dental: no  Any missed doses?: no       Is patient compliant with meds? yes      Comments: Pt to have cataract surgery, she will notify clinic when this is scheduled.  Allergies: No Known Drug Allergies  Anticoagulation Management History:      The patient is taking warfarin and comes in today for a routine follow up visit.  Positive risk factors for bleeding include an age of 75 years or older and presence of serious comorbidities.  The bleeding index is 'intermediate risk'.  Positive CHADS2 values include History of HTN and History of Diabetes.  Negative CHADS2 values include Age > 15 years old.  The start date was 11/14/2003.  Her last INR was 4.3 RATIO.  Anticoagulation responsible provider: Jens Som MD, Arlys John.  INR POC: 2.7.  Cuvette Lot#: [id number].  Exp: 09/2010.    Anticoagulation Management Assessment/Plan:      The patient's current anticoagulation dose is Warfarin sodium 5 mg tabs: take as directed.  The target INR is 2 - 3.  The next INR is due 09/15/2009.  Anticoagulation instructions were given to patient.  Results were reviewed/authorized by Eda Keys, PharmD.  She was notified by Eda Keys.         Prior Anticoagulation Instructions: INR 2.3 Continue 5mg s everyday except 2.5mg s on Mondays, Wednesdays and Fridays. Recheck in 3 weeks.  Current Anticoagulation Instructions: INR 2.7  Continue taking 1/2 tablet on Monday, Wednesday, and Friday and 1 tablet all other days.  Return to clinic in 4  weeks.

## 2010-05-15 NOTE — Medication Information (Signed)
Summary: [name]  Anticoagulant Therapy  Managed by: Cloyde Reams, RN, BSN Referring MD: Olga Millers MD Supervising MD: Eden Emms MD, Theron Arista Indication 1: Atrial Fibrillation (ICD-427.31) Lab Used: [facility]  Site: [facility] INR POC 2.6 INR RANGE 2 - 3  Dietary changes: no    Health status changes: no    Bleeding/hemorrhagic complications: no    Recent/future hospitalizations: no    Any changes in medication regimen? no    Recent/future dental: no  Any missed doses?: no       Is patient compliant with meds? yes       Current Medications (verified): 1)  Warfarin Sodium 5 Mg Tabs (Warfarin Sodium) .... Take As Directed 2)  Lipitor 40 Mg Tabs (Atorvastatin Calcium) .... Take 1 Tablet By Mouth Once A Day 3)  Metformin Hcl 500 Mg Tabs (Metformin Hcl) .... Two Tablet By Mouth Twice A Day 4)  Amlodipine Besylate 10 Mg Tabs (Amlodipine Besylate) .Marland Kitchen.. 1 Tablet By Mouth Once A Day 5)  Glipizide 10 Mg Tabs (Glipizide) .... Two  Tablet By Mouth Twice A Day 6)  Lisinopril 10 Mg Tabs (Lisinopril) .Marland Kitchen.. 1 Tablet By Mouth Once A Day 7)  Fish Oil 1000 Mg Caps (Omega-3 Fatty Acids) .Marland Kitchen.. 1 Tablet By Mouth Once Day 8)  Multivitamins  Caps (Multiple Vitamin) .Marland Kitchen.. 1 Daily 9)  Aspir-Low 81 Mg Tbec (Aspirin) .Marland Kitchen.. 1 Daily 10)  Metoprolol Succinate 50 Mg Xr24h-Tab (Metoprolol Succinate) .... Take 1 Tablet By Mouth Once A Day  Allergies (verified): No Known Drug Allergies  Anticoagulation Management History:      The patient is taking warfarin and comes in today for a routine follow up visit.  Positive risk factors for bleeding include an age of 75 years or older and presence of serious comorbidities.  The bleeding index is 'intermediate risk'.  Positive CHADS2 values include History of HTN and History of Diabetes.  Negative CHADS2 values include Age > 66 years old.  The start date was 11/14/2003.  Her last INR was 4.3 RATIO.  Anticoagulation responsible provider: Eden Emms MD, Theron Arista.  INR POC: 2.6.   Cuvette Lot#: [id number].  Exp: 05/2010.    Anticoagulation Management Assessment/Plan:      The patient's current anticoagulation dose is Warfarin sodium 5 mg tabs: take as directed.  The target INR is 2 - 3.  The next INR is due 05/16/2009.  Anticoagulation instructions were given to patient.  Results were reviewed/authorized by Cloyde Reams, RN, BSN.  She was notified by Cloyde Reams RN.         Prior Anticoagulation Instructions: INR 2.3  Continue on same dosage 1 tablet daily except 1/2 tablet on Mondays, Wednesdays, and Fridays.  Recheck in 4 weeks.    Current Anticoagulation Instructions: INR 2.6  Continue on same dosage 1 tablet daily except 1/2 tablet on Mondays, Wednesdays and Fridays.  Recheck in 4 weeks.

## 2010-05-15 NOTE — Medication Information (Signed)
Summary: rov/ewj  Anticoagulant Therapy  Managed by: Bethena Midget, RN, BSN Referring MD: Olga Millers MD PCP: Dr Para March MD: Shirlee Latch MD, Tucker Minter Indication 1: Atrial Fibrillation (ICD-427.31) Lab Used: [facility] Site: [facility] INR POC 3.5 INR RANGE 2 - 3  Dietary changes: no    Health status changes: no    Bleeding/hemorrhagic complications: no    Recent/future hospitalizations: no    Any changes in medication regimen? no    Recent/future dental: no  Any missed doses?: no       Is patient compliant with meds? yes       Current Medications (verified): 1)  Warfarin Sodium 5 Mg Tabs (Warfarin Sodium) .... Take As Directed 2)  Lipitor 40 Mg Tabs (Atorvastatin Calcium) .... Take 1 Tablet By Mouth Once A Day 3)  Metformin Hcl 1000 Mg Tabs (Metformin Hcl) .... Take 1 Tablet By Mouth Two Times A Day 4)  Amlodipine Besylate 10 Mg Tabs (Amlodipine Besylate) .Marland Kitchen.. 1 Tablet By Mouth Once A Day 5)  Glipizide 10 Mg Tabs (Glipizide) .... Two  Tablet By Mouth Twice A Day 6)  Lisinopril 10 Mg Tabs (Lisinopril) .Marland Kitchen.. 1 Tablet By Mouth Once A Day 7)  Fish Oil 1000 Mg Caps (Omega-3 Fatty Acids) .Marland Kitchen.. 1 Tablet By Mouth Once Day 8)  Multivitamins  Caps (Multiple Vitamin) .Marland Kitchen.. 1 Daily 9)  Aspir-Low 81 Mg Tbec (Aspirin) .Marland Kitchen.. 1 Daily 10)  Coreg 6.25 Mg Tabs (Carvedilol) .... Take 1 Tablet By Mouth Two Times A Day  Allergies (verified): No Known Drug Allergies  Anticoagulation Management History:      The patient is taking warfarin and comes in today for a routine follow up visit.  Positive risk factors for bleeding include an age of 75 years or older and presence of serious comorbidities.  The bleeding index is 'intermediate risk'.  Positive CHADS2 values include History of HTN and History of Diabetes.  Negative CHADS2 values include Age > 70 years old.  The start date was 11/14/2003.  Her last INR was 4.3 RATIO.  Anticoagulation responsible provider: Shirlee Latch MD, Davarious Tumbleson.  INR POC:  3.5.  Cuvette Lot#: [id number].  Exp: 08/2010.    Anticoagulation Management Assessment/Plan:      The patient's current anticoagulation dose is Warfarin sodium 5 mg tabs: take as directed.  The target INR is 2 - 3.  The next INR is due 07/28/2009.  Anticoagulation instructions were given to patient.  Results were reviewed/authorized by Bethena Midget, RN, BSN.  She was notified by Bethena Midget, RN, BSN.         Prior Anticoagulation Instructions: INR 2.7  Continue on same dosage 1 tablet daily except 1/2 tablet on Mondays, Wednesdays, and Fridays.  Recheck in 4 weeks.    Current Anticoagulation Instructions: INR  3.5 Skip today's dose. Then resume 1 pill everyday except 1/2 pill on Mondays, Wednesdays and Fridays. Recheck in 2-3 weeks.

## 2010-05-15 NOTE — Medication Information (Signed)
Summary: rov/sp  Anticoagulant Therapy  Managed by: Lyna Poser, PharmD Referring MD: Olga Millers MD PCP: Dr Para March MD: Shirlee Latch MD,Sheala Dosh Indication 1: Atrial Fibrillation (ICD-427.31) Lab Used: [facility] Site: [facility] INR POC 3 INR RANGE 2 - 3  Dietary changes: yes       Details: been at the beach and ate less greens  Health status changes: no    Bleeding/hemorrhagic complications: no    Recent/future hospitalizations: no    Any changes in medication regimen? yes       Details: started medicine for numbness-can't remember what  Recent/future dental: no  Any missed doses?: no       Is patient compliant with meds? yes      Comments: patient going out of country. Will not be back until october 26th. Puts at 5 weeks before a recheck. Counseled patients on signs of bleeding and to make sure she does not miss the next INR check.  Allergies: No Known Drug Allergies  Anticoagulation Management History:      The patient is taking warfarin and comes in today for a routine follow up visit.  Positive risk factors for bleeding include an age of 75 years or older and presence of serious comorbidities.  The bleeding index is 'intermediate risk'.  Positive CHADS2 values include History of HTN and History of Diabetes.  Negative CHADS2 values include Age > 75 years old.  The start date was 11/14/2003.  Her last INR was 4.3 RATIO.  Anticoagulation responsible provider: Shirlee Latch MD,Shanoah Asbill.  INR POC: 3.  Cuvette Lot#: [id number].  Exp: 01/2011.    Anticoagulation Management Assessment/Plan:      The patient's current anticoagulation dose is Warfarin sodium 5 mg tabs: take as directed.  The target INR is 2 - 3.  The next INR is due 02/07/2010.  Anticoagulation instructions were given to patient.  Results were reviewed/authorized by Lyna Poser, PharmD.         Prior Anticoagulation Instructions: INR 2.8  Continue same dose of 1 tablet every day except 1/2 tablet on Sunday,  Tuesday and Thursday.   Recheck INR in 4 weeks.   Current Anticoagulation Instructions: INR 3 Increase the amount of green leafy vegetables, you're eating. Continue taking a half tablet on sunday, tuesday, and thursday. And 1 tablet all other days. Recheck in 5 weeks.

## 2010-05-17 NOTE — Medication Information (Signed)
Summary: rov/sp  Anticoagulant Therapy  Managed by: Weston Brass, PharmD Referring MD: Olga Millers MD PCP: Dr Para March MD: Nahser Indication 1: Atrial Fibrillation (ICD-427.31) Lab Used: [facility] Site: [facility] INR POC 1.9 INR RANGE 2 - 3  Dietary changes: no    Health status changes: no    Bleeding/hemorrhagic complications: no    Recent/future hospitalizations: no    Any changes in medication regimen? no    Recent/future dental: no  Any missed doses?: no       Is patient compliant with meds? yes       Allergies: No Known Drug Allergies  Anticoagulation Management History:      The patient is taking warfarin and comes in today for a routine follow up visit.  Positive risk factors for bleeding include an age of 75 years or older and presence of serious comorbidities.  The bleeding index is 'intermediate risk'.  Positive CHADS2 values include History of HTN, Age > 25 years old, and History of Diabetes.  The start date was 11/14/2003.  Her last INR was 4.3 RATIO.  Anticoagulation responsible provider: Nahser.  INR POC: 1.9.  Cuvette Lot#: [id number].  Exp: 05/2011.    Anticoagulation Management Assessment/Plan:      The patient's current anticoagulation dose is Warfarin sodium 5 mg tabs: take as directed.  The target INR is 2 - 3.  The next INR is due 05/10/2010.  Anticoagulation instructions were given to patient.  Results were reviewed/authorized by Weston Brass, PharmD.  She was notified by Linward Headland, PharmD candidate.         Prior Anticoagulation Instructions: INR 2.0  Continue same dose of 1 tablet every day except 1/2 tablet on Sunday, Tuesday and Thursday.  Recheck INR in 4 weeks.   Current Anticoagulation Instructions: INR 1.9 (goal INR: 2-3)  Take 1 tablet everyday except 1/2 Sundays and Tuesdays.  Recheck in 2 weeks.

## 2010-05-17 NOTE — Medication Information (Signed)
Summary: rov/tm  Anticoagulant Therapy  Managed by: Weston Brass, PharmD Referring MD: Olga Millers MD PCP: Dr Para March MD: Nahser Indication 1: Atrial Fibrillation (ICD-427.31) Lab Used: [facility] Site: [facility] INR POC 2.0 INR RANGE 2 - 3  Dietary changes: no    Health status changes: no    Bleeding/hemorrhagic complications: no    Recent/future hospitalizations: no    Any changes in medication regimen? no    Recent/future dental: no  Any missed doses?: no       Is patient compliant with meds? yes       Allergies: No Known Drug Allergies  Anticoagulation Management History:      The patient is taking warfarin and comes in today for a routine follow up visit.  Positive risk factors for bleeding include an age of 33 years or older and presence of serious comorbidities.  The bleeding index is 'intermediate risk'.  Positive CHADS2 values include History of HTN, Age > 7 years old, and History of Diabetes.  The start date was 11/14/2003.  Her last INR was 4.3 RATIO.  Anticoagulation responsible provider: Nahser.  INR POC: 2.0.  Cuvette Lot#: [id number].  Exp: 05/2011.    Anticoagulation Management Assessment/Plan:      The patient's current anticoagulation dose is Warfarin sodium 5 mg tabs: take as directed.  The target INR is 2 - 3.  The next INR is due 04/26/2010.  Anticoagulation instructions were given to patient.  Results were reviewed/authorized by Weston Brass, PharmD.  She was notified by Weston Brass PharmD.         Prior Anticoagulation Instructions: INR 1.9 Today take 1 pill then resume 1 pill everyday except 1/2 pill on Tuesdays,  Thursdays and Sundays. Recheck in 4 weeks.   Current Anticoagulation Instructions: INR 2.0  Continue same dose of 1 tablet every day except 1/2 tablet on Sunday, Tuesday and Thursday.  Recheck INR in 4 weeks.

## 2010-05-17 NOTE — Assessment & Plan Note (Signed)
Summary: F1Y/DM   Visit Type:  Follow-up Primary Janai Brannigan:  Dr Parke Simmers   History of Present Illness: The patient is a pleasant  female who has a history of paroxysmal atrial fibrillation and prior CVA.  She had a Myoview performed on Aug 27, 2005, that showed an ejection fraction of 80% and normal perfusion.  Last echocardiogram in July of 2009 showed normal LV function and mild left atrial enlargement. I last saw her in December of 2010. Since then she denies any dyspnea on exertion, orthopnea, PND, pedal edema, palpitations, syncope or chest pain.   Current Medications (verified): 1)  Warfarin Sodium 5 Mg Tabs (Warfarin Sodium) .... Take As Directed 2)  Lipitor 40 Mg Tabs (Atorvastatin Calcium) .... Take 1 Tablet By Mouth Once A Day 3)  Metformin Hcl 1000 Mg Tabs (Metformin Hcl) .... Take 1 Tablet By Mouth Two Times A Day 4)  Amlodipine Besylate 10 Mg Tabs (Amlodipine Besylate) .Marland Kitchen.. 1 Tablet By Mouth Once A Day 5)  Glipizide 10 Mg Tabs (Glipizide) .... Two  Tablet By Mouth Twice A Day 6)  Lisinopril 10 Mg Tabs (Lisinopril) .Marland Kitchen.. 1 Tablet By Mouth Once A Day 7)  Fish Oil 1000 Mg Caps (Omega-3 Fatty Acids) .Marland Kitchen.. 1 Tablet By Mouth Once Day 8)  Multivitamins  Caps (Multiple Vitamin) .Marland Kitchen.. 1 Daily 9)  Aspir-Low 81 Mg Tbec (Aspirin) .Marland Kitchen.. 1 Daily 10)  Coreg 6.25 Mg Tabs (Carvedilol) .... Take 1 Tablet By Mouth Two Times A Day 11)  Topamax Sprinkle 15 Mg Cpsp (Topiramate) .... Take As Needed  Allergies (verified): No Known Drug Allergies  Past History:  Past Medical History: Reviewed history from 03/28/2009 and no changes required. COUMADIN THERAPY (ICD-V58.61) ATRIAL FIBRILLATION (ICD-427.31) HYPERLIPIDEMIA-MIXED (ICD-272.4) SYNCOPE AND COLLAPSE (ICD-780.2) DIABETES MELLITUS (ICD-250.00) Prior CVA.    Past Surgical History: Reviewed history from 08/30/2008 and no changes required. Back surgery. C section. Bilateral laser surgery in her eyes due to the diabetes.  Social  History: Reviewed history from 08/30/2008 and no changes required. Married  Tobacco Use - No.  Alcohol Use - no  Review of Systems       no fevers or chills, productive cough, hemoptysis, dysphasia, odynophagia, melena, hematochezia, dysuria, hematuria, rash, seizure activity, orthopnea, PND, pedal edema, claudication. Remaining systems are negative.   Vital Signs:  Patient profile:   77 year old female Height:      60 inches Weight:      96 pounds BMI:     18.82 Pulse rate:   100 / minute Pulse rhythm:   regular BP sitting:   128 / 70  (right arm)  Physical Exam  General:  Well-developed well-nourished in no acute distress.  Skin is warm and dry.  HEENT is normal.  Neck is supple. No thyromegaly.  Chest is clear to auscultation with normal expansion.  Cardiovascular exam is regular rate and rhythm.  Abdominal exam nontender or distended. No masses palpated. Extremities show no edema. neuro grossly intact    EKG  Procedure date:  04/03/2010  Findings:      Sinus rhythm. No ST changes.  Impression & Recommendations:  Problem # 1:  ESSENTIAL HYPERTENSION, BENIGN (ICD-401.1)  Blood controlled on present medications. Will continue. Potassium and renal function monitored by primary care. Her updated medication list for this problem includes:    Amlodipine Besylate 10 Mg Tabs (Amlodipine besylate) .Marland Kitchen... 1 tablet by mouth once a day    Lisinopril 10 Mg Tabs (Lisinopril) .Marland Kitchen... 1 tablet by mouth once a  day    Aspir-low 81 Mg Tbec (Aspirin) .Marland Kitchen... 1 daily    Coreg 6.25 Mg Tabs (Carvedilol) .Marland Kitchen... Take 1 tablet by mouth two times a day  Her updated medication list for this problem includes:    Amlodipine Besylate 10 Mg Tabs (Amlodipine besylate) .Marland Kitchen... 1 tablet by mouth once a day    Lisinopril 10 Mg Tabs (Lisinopril) .Marland Kitchen... 1 tablet by mouth once a day    Aspir-low 81 Mg Tbec (Aspirin) .Marland Kitchen... 1 daily    Coreg 6.25 Mg Tabs (Carvedilol) .Marland Kitchen... Take 1 tablet by mouth two times a  day  Problem # 2:  ATRIAL FIBRILLATION (ICD-427.31) Pt remains in sinus rhythm. Continue beta blocker and Coumadin. She will check with her insurance company to see if they will cover pradaxa; if so, will change. Her updated medication list for this problem includes:    Warfarin Sodium 5 Mg Tabs (Warfarin sodium) .Marland Kitchen... Take as directed    Aspir-low 81 Mg Tbec (Aspirin) .Marland Kitchen... 1 daily    Coreg 6.25 Mg Tabs (Carvedilol) .Marland Kitchen... Take 1 tablet by mouth two times a day  Orders: EKG w/ Interpretation (93000)  Her updated medication list for this problem includes:    Warfarin Sodium 5 Mg Tabs (Warfarin sodium) .Marland Kitchen... Take as directed    Aspir-low 81 Mg Tbec (Aspirin) .Marland Kitchen... 1 daily    Coreg 6.25 Mg Tabs (Carvedilol) .Marland Kitchen... Take 1 tablet by mouth two times a day  Problem # 3:  PURE HYPERCHOLESTEROLEMIA (ICD-272.0) Continue statin. Lipids and liver monitored by primary care. Her updated medication list for this problem includes:    Lipitor 40 Mg Tabs (Atorvastatin calcium) .Marland Kitchen... Take 1 tablet by mouth once a day  Problem # 4:  COUMADIN THERAPY (ICD-V58.61) Goal INR 2-3. Hemoglobin monitored by primary care.  Problem # 5:  DIABETES MELLITUS (ICD-250.00)  Her updated medication list for this problem includes:    Metformin Hcl 1000 Mg Tabs (Metformin hcl) .Marland Kitchen... Take 1 tablet by mouth two times a day    Glipizide 10 Mg Tabs (Glipizide) .Marland Kitchen..Marland Kitchen Two  tablet by mouth twice a day    Lisinopril 10 Mg Tabs (Lisinopril) .Marland Kitchen... 1 tablet by mouth once a day    Aspir-low 81 Mg Tbec (Aspirin) .Marland Kitchen... 1 daily  Patient Instructions: 1)  Your physician recommends that you schedule a follow-up appointment in: YEAR WITH DR CRENSHAW 2)  Your physician recommends that you continue on your current medications as directed. Please refer to the Current Medication list given to you today. 3)   PRADAXA A NEW MED TO REPLACE COUMADIN

## 2010-05-17 NOTE — Medication Information (Signed)
Summary: rov  Anticoagulant Therapy  Managed by: Weston Brass, PharmD Referring MD: Olga Millers MD PCP: Dr Parke Simmers Supervising MD: Myrtis Ser MD, Tinnie Gens Indication 1: Atrial Fibrillation (ICD-427.31) Lab Used: [facility] Site: [facility] INR POC 1.9 INR RANGE 2 - 3  Dietary changes: no    Health status changes: no    Bleeding/hemorrhagic complications: no    Recent/future hospitalizations: no    Any changes in medication regimen? no    Recent/future dental: no  Any missed doses?: no       Is patient compliant with meds? yes       Allergies: No Known Drug Allergies  Anticoagulation Management History:      The patient is taking warfarin and comes in today for a routine follow up visit.  Positive risk factors for bleeding include an age of 75 years or older and presence of serious comorbidities.  The bleeding index is 'intermediate risk'.  Positive CHADS2 values include History of HTN, Age > 28 years old, and History of Diabetes.  The start date was 11/14/2003.  Her last INR was 4.3 RATIO.  Anticoagulation responsible provider: Myrtis Ser MD, Tinnie Gens.  INR POC: 1.9.  Cuvette Lot#: [id number].  Exp: 05/2011.    Anticoagulation Management Assessment/Plan:      The patient's current anticoagulation dose is Warfarin sodium 5 mg tabs: take as directed.  The target INR is 2 - 3.  The next INR is due 06/06/2010.  Anticoagulation instructions were given to patient.  Results were reviewed/authorized by Weston Brass, PharmD.  She was notified by Linward Headland, PharmD candidate.         Prior Anticoagulation Instructions: INR 1.9 (goal INR: 2-3)  Take 1 tablet everyday except 1/2 Sundays and Tuesdays.  Recheck in 2 weeks.    Current Anticoagulation Instructions: INR 1.9 (INR goal: 2-3)  Take 1 and 1/2 tablets today.  Take 1 tablet everyday except 1/2 tablet on Tuesdays.  Recheck on 02/22.

## 2010-05-25 DIAGNOSIS — I4891 Unspecified atrial fibrillation: Secondary | ICD-10-CM

## 2010-06-06 ENCOUNTER — Encounter (INDEPENDENT_AMBULATORY_CARE_PROVIDER_SITE_OTHER): Payer: Medicare PPO

## 2010-06-06 ENCOUNTER — Encounter: Payer: Self-pay | Admitting: Cardiology

## 2010-06-06 DIAGNOSIS — Z7901 Long term (current) use of anticoagulants: Secondary | ICD-10-CM

## 2010-06-06 DIAGNOSIS — I4891 Unspecified atrial fibrillation: Secondary | ICD-10-CM

## 2010-06-12 NOTE — Medication Information (Signed)
Summary: rov/sp  Anticoagulant Therapy  Managed by: Weston Brass, PharmD Referring MD: Olga Millers MD PCP: Dr Para March MD: Shirlee Latch MD, Freida Busman Indication 1: Atrial Fibrillation (ICD-427.31) Lab Used: [facility] Site: [facility] INR POC 4.3 INR RANGE 2 - 3  Dietary changes: no    Health status changes: no    Bleeding/hemorrhagic complications: no    Recent/future hospitalizations: no    Any changes in medication regimen? no    Recent/future dental: no  Any missed doses?: no       Is patient compliant with meds? yes       Allergies: No Known Drug Allergies  Anticoagulation Management History:      The patient is taking warfarin and comes in today for a routine follow up visit.  Positive risk factors for bleeding include an age of 75 years or older and presence of serious comorbidities.  The bleeding index is 'intermediate risk'.  Positive CHADS2 values include History of HTN, Age > 37 years old, and History of Diabetes.  The start date was 11/14/2003.  Her last INR was 4.3 RATIO.  Anticoagulation responsible provider: Shirlee Latch MD, [place].  INR POC: 4.3.  Cuvette Lot#: [id number].  Exp: 04/2011.    Anticoagulation Management Assessment/Plan:      The patient's current anticoagulation dose is Warfarin sodium 5 mg tabs: take as directed.  The target INR is 2 - 3.  The next INR is due 06/20/2010.  Anticoagulation instructions were given to patient.  Results were reviewed/authorized by Weston Brass, PharmD.  She was notified by Margot Chimes PharmD Candidate.         Prior Anticoagulation Instructions: INR 1.9 (INR goal: 2-3)  Take 1 and 1/2 tablets today.  Take 1 tablet everyday except 1/2 tablet on Tuesdays.  Recheck on 02/22.   Current Anticoagulation Instructions: INR 4.3  Skip tomorrows dose of Coumadin and then start your new schedule of 1 tablet daily except for Wednesdays and Fridays when you only take 1/2 tablet. Recheck INR in 2 weeks.

## 2010-06-20 ENCOUNTER — Encounter (INDEPENDENT_AMBULATORY_CARE_PROVIDER_SITE_OTHER): Payer: Medicare PPO

## 2010-06-20 ENCOUNTER — Encounter: Payer: Self-pay | Admitting: Internal Medicine

## 2010-06-20 DIAGNOSIS — Z7901 Long term (current) use of anticoagulants: Secondary | ICD-10-CM

## 2010-06-20 DIAGNOSIS — I4891 Unspecified atrial fibrillation: Secondary | ICD-10-CM

## 2010-06-26 NOTE — Medication Information (Signed)
Summary: rov/pc  Anticoagulant Therapy  Managed by: Bayard Hugger, PharmD Referring MD: Olga Millers MD PCP: Dr Para March MD: Graciela Husbands MD, Viviann Spare Indication 1: Atrial Fibrillation (ICD-427.31) Lab Used: [facility] Site: [facility] INR RANGE 2 - 3  Dietary changes: no    Health status changes: no    Bleeding/hemorrhagic complications: no    Recent/future hospitalizations: no    Any changes in medication regimen? no    Recent/future dental: no  Any missed doses?: no       Is patient compliant with meds? yes       Allergies: No Known Drug Allergies  Anticoagulation Management History:      Positive risk factors for bleeding include an age of 75 years or older and presence of serious comorbidities.  The bleeding index is 'intermediate risk'.  Positive CHADS2 values include History of HTN, Age > 15 years old, and History of Diabetes.  The start date was 11/14/2003.  Her last INR was 4.3 RATIO.  Anticoagulation responsible provider: Graciela Husbands MD, Viviann Spare.  Cuvette Lot#: [id number].  Exp: 04/2011.    Anticoagulation Management Assessment/Plan:      The patient's current anticoagulation dose is Warfarin sodium 5 mg tabs: take as directed.  The target INR is 2 - 3.  The next INR is due 07/18/2010.  Anticoagulation instructions were given to patient.  Results were reviewed/authorized by Bayard Hugger, PharmD.         Prior Anticoagulation Instructions: INR 4.3  Skip tomorrows dose of Coumadin and then start your new schedule of 1 tablet daily except for Wednesdays and Fridays when you only take 1/2 tablet. Recheck INR in 2 weeks.    Current Anticoagulation Instructions: INR 3.0 The patient is to continue with the same dose of coumadin.  This dosage includes: 1 tablet (5mg ) every day except on Wed and Fri take 1/2 tablet (2.5mg ) recheck INR in 4 weeks

## 2010-07-05 ENCOUNTER — Other Ambulatory Visit: Payer: Self-pay | Admitting: Family Medicine

## 2010-07-05 DIAGNOSIS — M79604 Pain in right leg: Secondary | ICD-10-CM

## 2010-07-06 ENCOUNTER — Ambulatory Visit
Admission: RE | Admit: 2010-07-06 | Discharge: 2010-07-06 | Disposition: A | Discharge status: Home / Self Care | Payer: Medicare PPO | Source: Ambulatory Visit | Attending: Family Medicine | Admitting: Family Medicine

## 2010-07-06 DIAGNOSIS — M79605 Pain in left leg: Secondary | ICD-10-CM

## 2010-07-12 ENCOUNTER — Encounter (INDEPENDENT_AMBULATORY_CARE_PROVIDER_SITE_OTHER): Payer: Medicare PPO | Admitting: Vascular Surgery

## 2010-07-12 DIAGNOSIS — M79609 Pain in unspecified limb: Secondary | ICD-10-CM

## 2010-07-13 NOTE — Assessment & Plan Note (Signed)
OFFICE VISIT  Pam, Hale DOB:  [birth year]                                       07/12/2010 BJYNW#:[id number]  CHIEF COMPLAINT:  Left leg pain.  HISTORY OF PRESENT ILLNESS:  The patient is a 77 year old female who developed sudden onset of left leg pain which started in her buttocks and has progressed to worsening left lower extremity pain over the last 3 weeks.  She has no pain in her left foot.  She has no numbness in her feet.  She denies any back pain.  Chronic medical problems include diabetes and hypertension.  These are all currently controlled and followed by Dr. Parke Simmers.  The patient also has a history of paroxysmal atrial fibrillation and is on Coumadin for this.  She has her INR checked fairly regularly.  PAST MEDICAL HISTORY:  Remarkable for prior lumbar disk surgery and C- section.  She also had a stroke in 1999 and 2004 and 2009.  SOCIAL HISTORY:  She is widowed has 1 child.  She is retired.  She does not smoke or consume alcohol.  FAMILY HISTORY:  Remarkable for her mother who had hypertension.  Father had diabetes, hypertension and a stroke.  Brother had diabetes.  REVIEW OF SYSTEMS:  A full 12 point review of systems was performed with the patient today.  Other than symptoms related to her left lower extremity and her stroke symptoms, all other systems were negative.  I reviewed her bilateral pulse volume recordings from [facility] dated 07/06/2010.  This showed triphasic Doppler waveforms essentially throughout the right lower extremity.  The left lower extremity showed triphasic femoral waveforms and then monophasic below this.  Toe pressure on the left side was 32.  ABI was noncompressible on the left. ABI was 1.19 on the right with a digit pressure of 68 on the right.  PHYSICAL EXAMINATION:  Today blood pressure is 125/73 in the left arm, heart rate is 81 and regular, respirations 0.2.  HEENT:  Unremarkable. Neck:   Has 2+ carotid pulses without bruit.  Chest:  Clear to auscultation.  Cardiac:  Exam is regular rate rhythm without murmur. Abdomen:  Soft, nontender, nondistended.  No masses.  Extremities:  She has 2+ femoral 2+ popliteal and 2+ dorsalis pedis pulses in the right leg.  She has 2+ femoral and absent popliteal and pedal pulses in the left leg.  The left foot is pale compared to the right.  There are no open ulcerations.  Musculoskeletal:  Exam shows no obvious major joint deformities.  She does have a scar over the left lateral aspect of her knee from a laceration as a child.  She also walks with a limp on ambulation due to pain in her left lower extremity.  Neurologic:  Exam shows symmetric upper extremity and lower extremity motor strength which is 5/5.  She has no evidence of sensory loss.  Skin:  Other than the scar mentioned above, there are no open ulcers or rashes.  The patient has new onset of left leg pain which could be embolic due to her history of paroxysmal A-Fib although she is on Coumadin therapy. She also could have peripheral arterial disease causing some of her symptoms in  light of her abnormal pulse exam.  I believe the best option for her would be an aortogram bilateral lower extremity runoff and we scheduled  this for her on 07/20/2010.  Since she has had previous strokes which were most likely secondary to her A-Fib I  believe she needs bridging with heparin therapy as an inpatient.  We will stop her Coumadin on 07/17/2010.  She will be admitted to the hospital following day for IV heparin therapy for her arteriogram on 07/19/2009.  The risks, benefits, possible complications and procedure details were explained to the patient today.  She understands and agrees to proceed. She is on metformin and will need to stop this 48 hours after her arteriogram.    [name]. Fields, MD Electronically Signed  CEF/MEDQ  D:  07/12/2010  T:  07/13/2010  Job:  [id number]  cc:    [name], M.D.

## 2010-07-18 ENCOUNTER — Encounter: Payer: Medicare PPO | Admitting: *Deleted

## 2010-07-18 ENCOUNTER — Inpatient Hospital Stay (HOSPITAL_COMMUNITY)
Admission: RE | Admit: 2010-07-18 | Discharge: 2010-08-01 | DRG: 253 | Disposition: A | Discharge status: Skilled Nursing Facility | Payer: Medicare PPO | Source: Ambulatory Visit | Attending: Vascular Surgery | Admitting: Vascular Surgery

## 2010-07-18 DIAGNOSIS — Z8673 Personal history of transient ischemic attack (TIA), and cerebral infarction without residual deficits: Secondary | ICD-10-CM

## 2010-07-18 DIAGNOSIS — Z794 Long term (current) use of insulin: Secondary | ICD-10-CM

## 2010-07-18 DIAGNOSIS — F29 Unspecified psychosis not due to a substance or known physiological condition: Secondary | ICD-10-CM | POA: Diagnosis not present

## 2010-07-18 DIAGNOSIS — E119 Type 2 diabetes mellitus without complications: Secondary | ICD-10-CM | POA: Diagnosis present

## 2010-07-18 DIAGNOSIS — Z7982 Long term (current) use of aspirin: Secondary | ICD-10-CM

## 2010-07-18 DIAGNOSIS — I739 Peripheral vascular disease, unspecified: Principal | ICD-10-CM | POA: Diagnosis present

## 2010-07-18 DIAGNOSIS — I10 Essential (primary) hypertension: Secondary | ICD-10-CM | POA: Diagnosis present

## 2010-07-18 DIAGNOSIS — D62 Acute posthemorrhagic anemia: Secondary | ICD-10-CM | POA: Diagnosis not present

## 2010-07-18 DIAGNOSIS — I4891 Unspecified atrial fibrillation: Secondary | ICD-10-CM | POA: Diagnosis present

## 2010-07-18 DIAGNOSIS — Z7901 Long term (current) use of anticoagulants: Secondary | ICD-10-CM

## 2010-07-18 DIAGNOSIS — M79609 Pain in unspecified limb: Secondary | ICD-10-CM | POA: Diagnosis present

## 2010-07-18 LAB — CBC
HCT: 28.3 % — ABNORMAL LOW (ref 36.0–46.0)
Hemoglobin: 10 g/dL — ABNORMAL LOW (ref 12.0–15.0)
MCH: 32.9 pg (ref 26.0–34.0)
MCHC: 35.3 g/dL (ref 30.0–36.0)
RDW: 15.6 % — ABNORMAL HIGH (ref 11.5–15.5)

## 2010-07-18 LAB — BASIC METABOLIC PANEL
CO2: 19 mEq/L (ref 19–32)
Calcium: 9 mg/dL (ref 8.4–10.5)
Creatinine, Ser: 0.75 mg/dL (ref 0.4–1.2)
GFR calc non Af Amer: >60 mL/min (ref >60)
Glucose, Bld: 81 mg/dL (ref 70–99)

## 2010-07-19 LAB — GLUCOSE, CAPILLARY
Glucose-Capillary: 175 mg/dL — ABNORMAL HIGH (ref 70–99)
Glucose-Capillary: 133 mg/dL — ABNORMAL HIGH (ref 70–99)
Glucose-Capillary: 160 mg/dL — ABNORMAL HIGH (ref 70–99)
Glucose-Capillary: 75 mg/dL (ref 70–99)

## 2010-07-20 DIAGNOSIS — I70219 Atherosclerosis of native arteries of extremities with intermittent claudication, unspecified extremity: Secondary | ICD-10-CM

## 2010-07-20 LAB — CBC
MCHC: 33.5 g/dL (ref 30.0–36.0)
Platelets: 191 10*3/uL (ref 150–400)
RDW: 16.3 % — ABNORMAL HIGH (ref 11.5–15.5)

## 2010-07-20 LAB — BASIC METABOLIC PANEL
BUN: 16 mg/dL (ref 6–23)
Calcium: 9.1 mg/dL (ref 8.4–10.5)
Chloride: 111 mEq/L (ref 96–112)
Creatinine, Ser: 0.83 mg/dL (ref 0.4–1.2)
GFR calc non Af Amer: >60 mL/min (ref >60)

## 2010-07-20 LAB — PROTIME-INR
INR: 1.14 (ref 0.00–1.49)
Prothrombin Time: 14.8 seconds (ref 11.6–15.2)

## 2010-07-20 LAB — GLUCOSE, CAPILLARY: Glucose-Capillary: 180 mg/dL — ABNORMAL HIGH (ref 70–99)

## 2010-07-21 LAB — CBC
HCT: 27.8 % — ABNORMAL LOW (ref 36.0–46.0)
MCV: 94.9 fL (ref 78.0–100.0)
RDW: 15.9 % — ABNORMAL HIGH (ref 11.5–15.5)
WBC: 6.1 10*3/uL (ref 4.0–10.5)

## 2010-07-21 LAB — BASIC METABOLIC PANEL
CO2: 25 mEq/L (ref 19–32)
Calcium: 8.8 mg/dL (ref 8.4–10.5)
Glucose, Bld: 144 mg/dL — ABNORMAL HIGH (ref 70–99)
Sodium: 140 mEq/L (ref 135–145)

## 2010-07-21 LAB — GLUCOSE, CAPILLARY: Glucose-Capillary: 201 mg/dL — ABNORMAL HIGH (ref 70–99)

## 2010-07-22 LAB — GLUCOSE, CAPILLARY
Glucose-Capillary: 152 mg/dL — ABNORMAL HIGH (ref 70–99)
Glucose-Capillary: 332 mg/dL — ABNORMAL HIGH (ref 70–99)
Glucose-Capillary: 72 mg/dL (ref 70–99)
Glucose-Capillary: 330 mg/dL — ABNORMAL HIGH (ref 70–99)

## 2010-07-22 LAB — CBC
HCT: 28.1 % — ABNORMAL LOW (ref 36.0–46.0)
Hemoglobin: 9.6 g/dL — ABNORMAL LOW (ref 12.0–15.0)
MCH: 32.7 pg (ref 26.0–34.0)
MCV: 95.6 fL (ref 78.0–100.0)
RBC: 2.94 MIL/uL — ABNORMAL LOW (ref 3.87–5.11)

## 2010-07-22 LAB — BASIC METABOLIC PANEL
BUN: 8 mg/dL (ref 6–23)
CO2: 25 mEq/L (ref 19–32)
Chloride: 107 mEq/L (ref 96–112)
Glucose, Bld: 166 mg/dL — ABNORMAL HIGH (ref 70–99)
Potassium: 3.6 mEq/L (ref 3.5–5.1)

## 2010-07-22 LAB — HEPARIN LEVEL (UNFRACTIONATED)

## 2010-07-23 ENCOUNTER — Other Ambulatory Visit: Payer: Self-pay | Admitting: Cardiology

## 2010-07-23 ENCOUNTER — Inpatient Hospital Stay (HOSPITAL_COMMUNITY): Payer: Medicare PPO

## 2010-07-23 DIAGNOSIS — I70219 Atherosclerosis of native arteries of extremities with intermittent claudication, unspecified extremity: Secondary | ICD-10-CM

## 2010-07-23 HISTORY — PX: FEMORAL BYPASS: SHX50

## 2010-07-23 LAB — BASIC METABOLIC PANEL
BUN: 10 mg/dL (ref 6–23)
Calcium: 9.2 mg/dL (ref 8.4–10.5)
GFR calc non Af Amer: >60 mL/min (ref >60)
Glucose, Bld: 225 mg/dL — ABNORMAL HIGH (ref 70–99)
Sodium: 137 mEq/L (ref 135–145)

## 2010-07-23 LAB — CBC
HCT: 32.1 % — ABNORMAL LOW (ref 36.0–46.0)
Platelets: 204 10*3/uL (ref 150–400)
RDW: 15.8 % — ABNORMAL HIGH (ref 11.5–15.5)
WBC: 5.9 10*3/uL (ref 4.0–10.5)

## 2010-07-23 LAB — GLUCOSE, CAPILLARY

## 2010-07-23 LAB — MRSA PCR SCREENING: MRSA by PCR: NEGATIVE

## 2010-07-24 DIAGNOSIS — Z48812 Encounter for surgical aftercare following surgery on the circulatory system: Secondary | ICD-10-CM

## 2010-07-24 DIAGNOSIS — I739 Peripheral vascular disease, unspecified: Secondary | ICD-10-CM

## 2010-07-24 LAB — HEPARIN LEVEL (UNFRACTIONATED)
Heparin Unfractionated: <0.1 IU/mL — ABNORMAL LOW (ref 0.30–0.70)
Heparin Unfractionated: 0.32 IU/mL (ref 0.30–0.70)

## 2010-07-24 LAB — GLUCOSE, CAPILLARY: Glucose-Capillary: 387 mg/dL — ABNORMAL HIGH (ref 70–99)

## 2010-07-24 LAB — CBC
HCT: 18.4 % — ABNORMAL LOW (ref 36.0–46.0)
MCV: 95.8 fL (ref 78.0–100.0)
Platelets: 137 10*3/uL — ABNORMAL LOW (ref 150–400)
RBC: 1.92 MIL/uL — ABNORMAL LOW (ref 3.87–5.11)
WBC: 5.9 10*3/uL (ref 4.0–10.5)

## 2010-07-24 LAB — BASIC METABOLIC PANEL
Chloride: 107 mEq/L (ref 96–112)
GFR calc Af Amer: >60 mL/min (ref >60)
Potassium: 3.9 mEq/L (ref 3.5–5.1)
Sodium: 136 mEq/L (ref 135–145)

## 2010-07-24 LAB — POCT I-STAT 4, (NA,K, GLUC, HGB,HCT): Sodium: 143 mEq/L (ref 135–145)

## 2010-07-25 LAB — CROSSMATCH
ABO/RH(D): O POS
Antibody Screen: NEGATIVE
Unit division: 0

## 2010-07-25 LAB — CBC
HCT: 26 % — ABNORMAL LOW (ref 36.0–46.0)
MCHC: 33.8 g/dL (ref 30.0–36.0)
MCV: 87.2 fL (ref 78.0–100.0)
RDW: 20.7 % — ABNORMAL HIGH (ref 11.5–15.5)
WBC: 6.5 10*3/uL (ref 4.0–10.5)

## 2010-07-25 LAB — GLUCOSE, CAPILLARY: Glucose-Capillary: 247 mg/dL — ABNORMAL HIGH (ref 70–99)

## 2010-07-25 LAB — PROTIME-INR: INR: 1.74 — ABNORMAL HIGH (ref 0.00–1.49)

## 2010-07-26 LAB — CBC
Hemoglobin: 7.6 g/dL — ABNORMAL LOW (ref 12.0–15.0)
MCH: 30.2 pg (ref 26.0–34.0)
MCHC: 34.1 g/dL (ref 30.0–36.0)

## 2010-07-26 LAB — HEPARIN LEVEL (UNFRACTIONATED): Heparin Unfractionated: 0.26 IU/mL — ABNORMAL LOW (ref 0.30–0.70)

## 2010-07-26 LAB — GLUCOSE, CAPILLARY: Glucose-Capillary: 287 mg/dL — ABNORMAL HIGH (ref 70–99)

## 2010-07-27 LAB — CROSSMATCH
ABO/RH(D): O POS
Antibody Screen: NEGATIVE
Unit division: 0
Unit division: 0

## 2010-07-27 LAB — CBC
Hemoglobin: 10.9 g/dL — ABNORMAL LOW (ref 12.0–15.0)
MCH: 29 pg (ref 26.0–34.0)
MCV: 86.2 fL (ref 78.0–100.0)
RBC: 3.76 MIL/uL — ABNORMAL LOW (ref 3.87–5.11)

## 2010-07-27 LAB — BASIC METABOLIC PANEL
CO2: 25 mEq/L (ref 19–32)
Calcium: 8.6 mg/dL (ref 8.4–10.5)
Chloride: 106 mEq/L (ref 96–112)
GFR calc Af Amer: >60 mL/min (ref >60)
Sodium: 138 mEq/L (ref 135–145)

## 2010-07-27 LAB — GLUCOSE, CAPILLARY
Glucose-Capillary: 248 mg/dL — ABNORMAL HIGH (ref 70–99)
Glucose-Capillary: 236 mg/dL — ABNORMAL HIGH (ref 70–99)

## 2010-07-27 LAB — PROTIME-INR: INR: 1.97 — ABNORMAL HIGH (ref 0.00–1.49)

## 2010-07-28 DIAGNOSIS — M79609 Pain in unspecified limb: Secondary | ICD-10-CM

## 2010-07-28 LAB — GLUCOSE, CAPILLARY
Glucose-Capillary: 127 mg/dL — ABNORMAL HIGH (ref 70–99)
Glucose-Capillary: 163 mg/dL — ABNORMAL HIGH (ref 70–99)
Glucose-Capillary: 108 mg/dL — ABNORMAL HIGH (ref 70–99)
Glucose-Capillary: 134 mg/dL — ABNORMAL HIGH (ref 70–99)

## 2010-07-28 LAB — CBC
HCT: 26.7 % — ABNORMAL LOW (ref 36.0–46.0)
Hemoglobin: 9 g/dL — ABNORMAL LOW (ref 12.0–15.0)
MCH: 29 pg (ref 26.0–34.0)
MCHC: 33.7 g/dL (ref 30.0–36.0)
MCV: 86.1 fL (ref 78.0–100.0)
RBC: 3.1 MIL/uL — ABNORMAL LOW (ref 3.87–5.11)

## 2010-07-29 LAB — GLUCOSE, CAPILLARY
Glucose-Capillary: 204 mg/dL — ABNORMAL HIGH (ref 70–99)
Glucose-Capillary: 240 mg/dL — ABNORMAL HIGH (ref 70–99)
Glucose-Capillary: 141 mg/dL — ABNORMAL HIGH (ref 70–99)

## 2010-07-30 LAB — PROTIME-INR: INR: 3.93 — ABNORMAL HIGH (ref 0.00–1.49)

## 2010-07-30 LAB — GLUCOSE, CAPILLARY: Glucose-Capillary: 182 mg/dL — ABNORMAL HIGH (ref 70–99)

## 2010-07-30 NOTE — Op Note (Signed)
NAMEJOELEE, SNOKE NO.:  [id number]  MEDICAL RECORD NO.:  [id number]           PATIENT TYPE:  LOCATION:                                 FACILITY:  PHYSICIAN:  Fransisco Hertz, MD       DATE OF BIRTH:  [birth year]  DATE OF PROCEDURE: DATE OF DISCHARGE:                              OPERATIVE REPORT   PROCEDURES:  Right common femoral artery cannulation under ultrasound guidance, aortogram, second-order arterial selection, and left lower extremity angiogram.  PREOPERATIVE DIAGNOSIS:  Left leg claudication.  POSTOPERATIVE DIAGNOSIS:  Left leg claudication.  SURGEON:  Fransisco Hertz, MD  ESTIMATED BLOOD LOSS:  Minimal.  ANESTHESIA:  Conscious sedation.  CONTRAST:  55 mL.  SPECIMENS:  None.  FINDINGS: 1. A patent but small aorta. 2. Patent celiac and superior mesenteric artery. 3. Patent bilateral common iliac artery, internal iliac artery, and     external iliac artery. 4. Patent left common femoral artery, superficial femoral artery, and     profunda artery. 5. Multiple stenoses in the left SFA.  The SFA tapers distally down to     less than 50% of its maximal diameter. 6. Popliteal artery is patent except at the level of the knee where     the appearance suggestive of possible embolism, likely chronic. 7. There is evidence of collateralization around the embolism with     reconstitution of below-the-knee popliteal artery. 8. There is a patent trifurcation. 9. The left peroneal and anterior tibial arteries are occluded. 10.There is diseased takeoff of the posterior tibial artery. 11.The posterior tibial artery is the only runoff in the left leg.  INDICATIONS:  This is a 77 year old patient who presents with history of atrial fibrillation.  Her story was concerning for possible chronic embolism versus chronic peripheral arterial disease in the left leg.  It was felt she needed an angiogram to determine whether or not she had any peripheral arterial  disease or an embolism.  Subsequently, she was admitted and hydrated preoperatively for her angiogram.  She is aware of the risks of this procedure including bleeding, infection, access site complications, possible renal failure from the contrast dye, possible embolism, and possible need for additional procedures.  DESCRIPTION OF OPERATION:  After full informed written consent was obtained from the patient, she was brought back to the angio suite and placed supine upon the angio table.  She was connected to the monitoring equipment prior to being given conscious sedation, the amounts of which were listed in her chart.  She was then prepped and draped in a standard fashion for a bilateral leg runoff and aortogram.  I turned my attention to her right groin.  Under ultrasound guidance, I identified a common femoral artery that was patent.  This was cannulated with a micropuncture needle and then I passed a microwire up into the iliac artery.  The needle was exchanged for a micro-sheath which was advanced into the common femoral artery.  The needle and dilator were removed and a Bentson wire was passed up into the aorta.  The micro-sheath was then exchanged for a 5-French sheath.  At this point,  I loaded an Omni-flush catheter over the wire up to the level of L1.  The wire was removed. The catheter was connected to the power injector circuit after performing declotting deairing maneuver.  Power injector aortic findings are as listed above.  At this point, I pulled down the catheter to the bifurcation and using a Bentson wire and this catheter I selected out the left common iliac artery.  Using this combination, I made my way down into the distal external iliac artery.  The catheter was then reconnected to the power injector circuit after performing a declotting and deairing maneuver.  A left leg runoff was then completed, the findings of which were listed above.  Based on the findings, I felt  that there was a possible embolism at the level of the knee. However, this was likely chronic.  I had some concerns with use pharmacomechanical thrombectomy in this case.  I felt that there was a risk of embolizing into the only patent runoff vessel, the posterior tibial artery, I contacted Dr. Darrick Penna who was this patient's primary surgeon and we reviewed the images.  Based on these images, we agreed that she needed a surgical embolectomy and possibly a femoral to popliteal bypass.  I had some concerns with the poor quality of the posterior tibial artery takeoff.  Even with a femoral to popliteal bypass, it might be of limited benefit in this patient.  At this point, I reconstituted the crook of the catheter by placing the Bentson wire in place and then removed both from the patient's sheath.  The sheath was aspirated and then flushed with heparinized saline.  The plan is to pull the sheath in the holding area, 4 hours of bed rest, and then will resume anticoagulation in 12 hours and then the plan is to go ahead with the embolectomy and possible bypass on Monday.     Fransisco Hertz, MD     BLC/MEDQ  D:  07/20/2010  T:  07/21/2010  Job:  [id number]  Electronically Signed by Leonides Sake MD on 07/30/2010 09:11:29 AM

## 2010-07-31 LAB — CBC
HCT: 28.1 % — ABNORMAL LOW (ref 36.0–46.0)
Hemoglobin: 9.3 g/dL — ABNORMAL LOW (ref 12.0–15.0)
MCH: 29.2 pg (ref 26.0–34.0)
MCHC: 33.1 g/dL (ref 30.0–36.0)
MCV: 88.1 fL (ref 78.0–100.0)
Platelets: 279 K/uL (ref 150–400)
RBC: 3.19 MIL/uL — ABNORMAL LOW (ref 3.87–5.11)
RDW: 17.1 % — ABNORMAL HIGH (ref 11.5–15.5)
WBC: 7.4 K/uL (ref 4.0–10.5)

## 2010-07-31 LAB — GLUCOSE, CAPILLARY: Glucose-Capillary: 246 mg/dL — ABNORMAL HIGH (ref 70–99)

## 2010-07-31 LAB — PROTIME-INR
INR: 4.04 — ABNORMAL HIGH (ref 0.00–1.49)
Prothrombin Time: 39.2 s — ABNORMAL HIGH (ref 11.6–15.2)

## 2010-08-01 LAB — GLUCOSE, CAPILLARY

## 2010-08-06 NOTE — Discharge Summary (Signed)
[name], [name]              ACCOUNT NO.:  [id number]  MEDICAL RECORD NO.:  [id number]           PATIENT TYPE:  I  LOCATION:  2039                         FACILITY:  [facility]  PHYSICIAN:  [name]. Fields, MD  DATE OF BIRTH:  Jul 07, 1932  DATE OF ADMISSION:  07/18/2010 DATE OF DISCHARGE:                        DISCHARGE SUMMARY - REFERRING   ADMIT DIAGNOSIS:  Left lower extremity claudication.  PAST MEDICAL HISTORY AND DISCHARGE DIAGNOSES: 1. Left lower extremity claudication status post aortogram and status     post left above-the-knee to below-the-knee popliteal bypass with     greater saphenous vein. 2. Atrial fibrillation on chronic Coumadin. 3. Diabetes mellitus. 4. Hypertension. 5. History of lumbar surgery. 6. History of C-section. 7. History of stroke in 1999, 2004, 2009.  ALLERGIES:  No known drug allergies.  BRIEF HISTORY:  The patient is a 77 year old African American female who developed sudden onset of left lower extremity pain which started in the buttock and had progressed to worsening left lower extremity pain over the last 3 weeks.  She had no pain in her foot.  The patient also denied numbness.  Secondary to her history of chronic atrial fibrillation and Coumadin therapy, she was thought to possibly have  embolic versus chronic peripheral arterial disease causing some of the symptoms.  Secondary to these findings, Dr. Darrick Penna felt that the patient would benefit from an aortogram with bilateral lower extremity runoff which was scheduled for July 20, 2010.  The patient would require bridging with heparin therapy secondary to need to stop her Coumadin. She was then instructed to stop her Coumadin on April 3 and was admitted to the hospital for IV heparin therapy prior to arteriogram.  HOSPITAL COURSE:  The patient was admitted on July 19, 2010 for heparin bridge and IV hydration prior to arteriogram which was performed on July 20, 2010.  This revealed  multiple stenoses in the left SFA, popliteal artery with possible embolus at the knee level which was likely chronic, and evidence of collateralization around the embolism with reconstitution of the below-knee popliteal artery.  There was also a patent trifurcation.  Secondary to these findings, the patient was admitted for continued heparin therapy in anticipation of lower extremity revascularization on the following Monday.  Vein mapping was completed and the patient was monitored throughout the weekend.  The patient was taken to the OR on July 23, 2010 for a left above-knee to below-knee popliteal bypass with reverse greater saphenous vein.  The patient tolerated the procedure well and was hemodynamically stable immediately postoperatively.  She was transferred from the OR to Post Anesthesia Care Unit in stable condition.  The patient was extubated without complication and woke up from anesthesia neurologically intact.  The patient's postoperative course progressed as expected with the exception of acute blood loss anemia.  She has been transfused on 2 separate occasions for this and her hemoglobin is currently stable.  The patient has also had difficulty ambulating secondary to pain initially and then secondary to deconditioning.  She will require continuation of physical therapy and occupational therapy which were started early in the postoperative course.  She has  remained afebrile with stable vital signs throughout the hospital course.  Her Foley was successfully discontinued without any difficulty.  The patient's diabetes mellitus has been monitored closely throughout the hospital course.  She had some elevated CBGs in the evenings and therefore Lantus was added at bedtime.  The patient was also noted to have some slight confusion in the evening. This was thought to be secondary to pain medication which was subsequently stopped and the confusion resolved.  She has been continued  on heparin and Coumadin throughout the postoperative course until such time as her INR was found to be therapeutic and the heparin was subsequently discontinued.  The Coumadin will be lifelong secondary to her history of stroke, AFib, and positive chronic embolus in the left lower extremity.  The patient did complain of some left lower extremity swelling and posterior calf pain on July 27, 2010.  A left lower extremity venous duplex was checked and this was negative for DVT.  On August 01, 2010, the patient was without complaint.  She is working well with physical therapy and occupational therapy, although she does still require significant assistance.  She is afebrile with stable vital signs.  Her INR is 2.7.  PHYSICAL EXAMINATION:  CARDIAC:  Regular rate and rhythm. LUNGS:  Clear to auscultation. ABDOMEN:  Soft with active bowel sounds. EXTREMITIES:  The incisions are clean, dry, and intact.  There is some induration of the distal incision but no evidence of erythema or cellulitis.  There is edema present in the left lower extremity and the calf continues to be tender to palpation in the posterior aspect.  There was no evidence of compartment syndrome.  This has not changed in the past few days.  The left foot is warm and well perfused with a left posterior tibial Doppler signal.  The patient is doing well and is stable at this time for transfer to skilled nursing facility for continuation of physical therapy and Coumadin therapy.  DISCHARGE INSTRUCTIONS:  The patient received written discharge instructions regarding diet, activity, and wound care.  She will follow up with Dr. Darrick Penna in approximately 2 weeks after discharge.  The office will contact the patient with the date and time of this appointment. She will also need to follow up with Dr. Jens Som, her cardiologist for atrial fibrillation and PT and INR.  Her INR should be rechecked on August 03, 2010.  Her Coumadin dosing  should be adjusted to maintain an INR between 2 and 2.5.  DISCHARGE MEDICATIONS: 1. Acetaminophen 325 mg 1-2 q.4-6 h. p.r.n. pain or fever greater than     103 degrees Fahrenheit. 2. Sliding scale insulin 1-15 units subcu t.i.d. with meals. 3. Lantus 10 units subcu at bedtime. 4. Tramadol 50 mg 1-2 q.4-6 h. p.r.n. pain. 5. Coumadin 2.5 mg 1 p.o. daily, the dose to be adjusted based on INR     2 to 2.5. 6. Aspirin 81 mg daily. 7. Carvedilol 6.25 mg b.i.d. 8. Fish oil 1000 mg daily. 9. Glipizide 10 mg b.i.d. before meals. 10.Lipitor 40 mg daily. 11.Metformin 500 mg 2 tablets b.i.d. after meals. 12.Multivitamin 1 daily. 13.Topamax 15 mg b.i.d.     Pecola Leisure, PA   ______________________________ [name] Fields, MD    AY/MEDQ  D:  08/01/2010  T:  08/01/2010  Job:  [id number]  Electronically Signed by Pecola Leisure PA on 08/03/2010 01:19:07 PM Electronically Signed by Fabienne Bruns MD on 08/06/2010 11:10:01 AM

## 2010-08-06 NOTE — Op Note (Signed)
[name], [name]              ACCOUNT NO.:  [id number]  MEDICAL RECORD NO.:  [id number]           PATIENT TYPE:  I  LOCATION:  3308                         FACILITY:  [facility]  PHYSICIAN:  [name]. Jena Tegeler, MD  DATE OF BIRTH:  1933/02/28  DATE OF PROCEDURE:  07/23/2010 DATE OF DISCHARGE:                              OPERATIVE REPORT   PROCEDURE:  Left above-knee to below-knee popliteal bypass using reversed left greater saphenous vein.  PREOPERATIVE DIAGNOSIS:  Wrist pain, left foot.  POSTOPERATIVE DIAGNOSIS:  Wrist pain, left foot.  ANESTHESIA:  General.  ASSISTANT:  Pecola Leisure, PA-C  OPERATIVE FINDINGS: 1. A 2.5 to 3 mm left greater saphenous vein. 2. 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  OPERATIVE DETAILS:  After obtaining informed consent, the patient was taken to the operating room.  The patient was placed in supine position on the operating room table.  After induction of general anesthesia endotracheal intubation, a Foley catheter was placed.  Next the patient's entire left lower extremity was prepped and draped in usual sterile fashion.  Longitudinal incision was made on the medial aspect of the left leg over the greater saphenous vein.  This was carried down through the subcutaneous tissues.  Several venous tributary branches off the greater saphenous were ligated and divided between silk ties or clips.  The vein was reflected posteriorly and the incision was deepened down in the below-knee popliteal space.  Preoperative arteriogram had suggested she may have embolus in the popliteal artery versus atherosclerotic occlusive disease.  Therefore, the popliteal artery was dissected free circumferentially.  It was soft in character but was not pulsatile.  The anterior tibial artery was dissected free circumferentially and the vessel loop was placed around this.   The tibioperoneal trunk was dissected free circumferentially and vessel loop placed around this.  At this point a transverse arteriotomy was made after administration of 7000 units of heparin.  The arteriotomy was made just above the takeoff of the tibial vessels.  A #3 and #4 Fogarty catheters were then passed through the popliteal artery and made multiple attempts try to pass this up into the native popliteal system. However, the plaque was quite firm and the catheter would not pass free. At this point attempts were abandoned to try to do an embolectomy.  The arteriogram was reviewed and it seemed like the above-knee popliteal artery may be adequate inflow for the patient.  Therefore, at this point through an additional skip incision, the greater saphenous vein was harvested above the knee as well.  Several small side branches were ligated and divided between silk ties or clips.  The vein was of reasonable quality below the knee approximately 2.5 mm, but a better quality above the knee approximately 3 mm in diameter.  The vein was reflected posteriorly and the incision deepened and the above-knee popliteal space entered.  The above-knee popliteal artery was dissected free circumferentially, it was soft in quality and had excellent pulse within it.  Vessel loop was placed around this.  The  greater saphenous vein was then harvested from the mid calf up to the mid thigh.  This was gently distended with heparinized saline, it was ligated on the proximal and distal ends on the portion remaining in the patient.  The vein was then placed in a reversed configuration.  The patient had been given 7000 units of heparin prior to the arteriotomy in the below-knee popliteal artery.  She was given an additional 5000 units of heparin prior to clamping the above-knee popliteal artery.  This was controlled proximally with a small bulldog clamp and distally with a Glover clamp. A longitudinal opening was  made in the popliteal artery.  The inflow was assessed and this was very good.  This was then reoccluded and the vein placed in a reverse configuration, spatulated and sewn end of vein to side of artery using a running 6-0 Prolene suture.  Just prior to completion, anastomosis was fore bled and back bled and thoroughly flushed.  Anastomosis was secured.  Clamps released.  There was good pulsatile flow into the vein graft immediately.  This was then tunneled deep between the heads of gastrocnemius muscles to the below-knee popliteal incision.  The preexisting transverse arteriotomy was repaired with 4 interrupted 7-0 Prolene sutures.  Over the posterior wall, the artery was intact.  The vein was then spatulated and the popliteal artery opened longitudinally for approximately 2 cm.  Vein was pulled to length and the leg straightened and then cut to length and spatulated and sewn end of vein to side of artery using a running 6-0 Prolene suture.  Just prior to completion anastomosis, fore bled and back bled and thoroughly flushed.  Anastomosis was secured, clamps released as pulsatile flow in the below-knee popliteal artery immediately.  There was a good posterior tibial Doppler signal which augmented approximately 90% was unclamping of the graft.  There was a faint dorsalis pedis Doppler signal.  The patient has a known preoperative anterior tibial artery occlusion.  Next hemostasis was obtained.  Intraoperative arteriogram was obtained with inflow occlusion by introducing a 21-gauge butterfly needle on the proximal aspect of the vein graft.  This showed a patent distal anastomosis of the below-knee popliteal artery with a patent posterior tibial artery with an occluded peroneal and anterior tibial artery.  The needle was removed and the hole repaired with a single 7-0 Prolene suture.  After hemostasis was obtained, each of the incisions were closed with running 3-0 Vicryl suture and the  skin closed with a 4-0 Vicryl subcuticular stitch.  The patient had a palpable posterior tibial pulse at the end of the case.  The patient tolerated the procedure well and there were no complications.  Instrument, sponge and needle counts were correct at the end of the case.  The patient was taken to the recovery room in stable condition.     [name]. Damian Hofstra, MD     CEF/MEDQ  D:  07/23/2010  T:  07/24/2010  Job:  [id number]  Electronically Signed by Fabienne Bruns MD on 08/06/2010 11:09:53 AM

## 2010-08-09 ENCOUNTER — Ambulatory Visit (INDEPENDENT_AMBULATORY_CARE_PROVIDER_SITE_OTHER): Payer: Medicare PPO | Admitting: Vascular Surgery

## 2010-08-09 ENCOUNTER — Encounter (INDEPENDENT_AMBULATORY_CARE_PROVIDER_SITE_OTHER): Payer: Medicare PPO

## 2010-08-09 DIAGNOSIS — I739 Peripheral vascular disease, unspecified: Secondary | ICD-10-CM

## 2010-08-09 DIAGNOSIS — Z48812 Encounter for surgical aftercare following surgery on the circulatory system: Secondary | ICD-10-CM

## 2010-08-09 DIAGNOSIS — I70219 Atherosclerosis of native arteries of extremities with intermittent claudication, unspecified extremity: Secondary | ICD-10-CM

## 2010-08-10 NOTE — Assessment & Plan Note (Signed)
OFFICE VISIT  [name], [name] DOB:  30-Nov-1932                                       08/09/2010 [id number]#:[id number]  The patient returns for followup today.  She previously underwent a left above-knee to below-knee popliteal bypass using saphenous vein on July 23, 2010.  She returned today for her postop visit.  Of note, she is on Coumadin for paroxysmal atrial fibrillation.  On exam today, she has significant edema in the left lower extremity. She has actually had some skin slough due to stretching of the skin along the incision lines above and below the knee.  She has a full feeling in both these incisions, also suggesting some hematoma.  The left foot is slightly cool, but a duplex ultrasound today showed that the bypass is patent.  On physical exam, blood pressure is 171/72 in the right arm, heart rate is 89 and regular, temperature is 97.9.  Left lower extremity edema as mentioned above.  There is a small amount of blood oozing from the above and below-knee incisions.  Left foot is slightly pale and cool.  There is some skin slough on the medial incisions.  I believe the patient has a significant hematoma in the left lower extremity.  This is causing her significant pain and also impeding wound healing.  I believe the best option at this point would be to stop her Coumadin today.  We will schedule her for an I and D of the hematoma in her left leg on Monday, April 30th.  She will probably need to be in the hospital for a few days for pain control and to make sure that she does not reaccumulate hematoma.  We will need to stop her Coumadin for several days.    [name]. Fields, MD Electronically Signed  CEF/MEDQ  D:  08/09/2010  T:  08/10/2010  Job:  [id number]

## 2010-08-13 ENCOUNTER — Telehealth: Payer: Self-pay | Admitting: Internal Medicine

## 2010-08-13 ENCOUNTER — Inpatient Hospital Stay (HOSPITAL_COMMUNITY): Payer: Medicare PPO

## 2010-08-13 ENCOUNTER — Inpatient Hospital Stay (HOSPITAL_COMMUNITY)
Admission: RE | Admit: 2010-08-13 | Discharge: 2010-08-16 | DRG: 921 | Disposition: A | Discharge status: Skilled Nursing Facility | Payer: Medicare PPO | Source: Ambulatory Visit | Attending: Vascular Surgery | Admitting: Vascular Surgery

## 2010-08-13 DIAGNOSIS — I70209 Unspecified atherosclerosis of native arteries of extremities, unspecified extremity: Secondary | ICD-10-CM | POA: Diagnosis present

## 2010-08-13 DIAGNOSIS — T8140XA Infection following a procedure, unspecified, initial encounter: Secondary | ICD-10-CM

## 2010-08-13 DIAGNOSIS — IMO0002 Reserved for concepts with insufficient information to code with codable children: Principal | ICD-10-CM | POA: Diagnosis present

## 2010-08-13 DIAGNOSIS — E785 Hyperlipidemia, unspecified: Secondary | ICD-10-CM | POA: Diagnosis present

## 2010-08-13 DIAGNOSIS — Z7982 Long term (current) use of aspirin: Secondary | ICD-10-CM

## 2010-08-13 DIAGNOSIS — Z7901 Long term (current) use of anticoagulants: Secondary | ICD-10-CM

## 2010-08-13 DIAGNOSIS — Z8673 Personal history of transient ischemic attack (TIA), and cerebral infarction without residual deficits: Secondary | ICD-10-CM

## 2010-08-13 DIAGNOSIS — E119 Type 2 diabetes mellitus without complications: Secondary | ICD-10-CM | POA: Diagnosis present

## 2010-08-13 DIAGNOSIS — B999 Unspecified infectious disease: Secondary | ICD-10-CM

## 2010-08-13 DIAGNOSIS — Y92009 Unspecified place in unspecified non-institutional (private) residence as the place of occurrence of the external cause: Secondary | ICD-10-CM

## 2010-08-13 DIAGNOSIS — I4891 Unspecified atrial fibrillation: Secondary | ICD-10-CM | POA: Diagnosis present

## 2010-08-13 DIAGNOSIS — Z794 Long term (current) use of insulin: Secondary | ICD-10-CM

## 2010-08-13 DIAGNOSIS — I10 Essential (primary) hypertension: Secondary | ICD-10-CM | POA: Diagnosis present

## 2010-08-13 DIAGNOSIS — Y832 Surgical operation with anastomosis, bypass or graft as the cause of abnormal reaction of the patient, or of later complication, without mention of misadventure at the time of the procedure: Secondary | ICD-10-CM | POA: Diagnosis present

## 2010-08-13 LAB — GLUCOSE, CAPILLARY
Glucose-Capillary: 112 mg/dL — ABNORMAL HIGH (ref 70–99)
Glucose-Capillary: 96 mg/dL (ref 70–99)
Glucose-Capillary: 192 mg/dL — ABNORMAL HIGH (ref 70–99)

## 2010-08-13 LAB — APTT: aPTT: 32 seconds (ref 24–37)

## 2010-08-13 LAB — SURGICAL PCR SCREEN: Staphylococcus aureus: NEGATIVE

## 2010-08-13 LAB — BASIC METABOLIC PANEL
BUN: 9 mg/dL (ref 6–23)
Calcium: 9.3 mg/dL (ref 8.4–10.5)
GFR calc non Af Amer: >60 mL/min (ref >60)
Glucose, Bld: 100 mg/dL — ABNORMAL HIGH (ref 70–99)
Potassium: 4.1 mEq/L (ref 3.5–5.1)
Sodium: 139 mEq/L (ref 135–145)

## 2010-08-13 LAB — PROTIME-INR
INR: 1.13 (ref 0.00–1.49)
Prothrombin Time: 14.7 seconds (ref 11.6–15.2)

## 2010-08-13 LAB — CBC
HCT: 33.3 % — ABNORMAL LOW (ref 36.0–46.0)
Hemoglobin: 10.8 g/dL — ABNORMAL LOW (ref 12.0–15.0)
MCHC: 32.4 g/dL (ref 30.0–36.0)
RDW: 18.8 % — ABNORMAL HIGH (ref 11.5–15.5)
WBC: 6.2 10*3/uL (ref 4.0–10.5)

## 2010-08-13 NOTE — Telephone Encounter (Signed)
All Cardiac faxed to [facility] @ [phone] 08/13/10/KM

## 2010-08-14 DIAGNOSIS — Z48812 Encounter for surgical aftercare following surgery on the circulatory system: Secondary | ICD-10-CM

## 2010-08-14 DIAGNOSIS — I70219 Atherosclerosis of native arteries of extremities with intermittent claudication, unspecified extremity: Secondary | ICD-10-CM

## 2010-08-14 LAB — GLUCOSE, CAPILLARY
Glucose-Capillary: 175 mg/dL — ABNORMAL HIGH (ref 70–99)
Glucose-Capillary: 234 mg/dL — ABNORMAL HIGH (ref 70–99)
Glucose-Capillary: 184 mg/dL — ABNORMAL HIGH (ref 70–99)
Glucose-Capillary: 133 mg/dL — ABNORMAL HIGH (ref 70–99)

## 2010-08-14 LAB — CBC
Platelets: 278 10*3/uL (ref 150–400)
RBC: 3.41 MIL/uL — ABNORMAL LOW (ref 3.87–5.11)
RDW: 19 % — ABNORMAL HIGH (ref 11.5–15.5)
WBC: 5.5 10*3/uL (ref 4.0–10.5)

## 2010-08-14 LAB — BASIC METABOLIC PANEL
Chloride: 104 mEq/L (ref 96–112)
GFR calc Af Amer: >60 mL/min (ref >60)
GFR calc non Af Amer: >60 mL/min (ref >60)
Potassium: 3.8 mEq/L (ref 3.5–5.1)
Sodium: 140 mEq/L (ref 135–145)

## 2010-08-15 LAB — GLUCOSE, CAPILLARY
Glucose-Capillary: 137 mg/dL — ABNORMAL HIGH (ref 70–99)
Glucose-Capillary: 105 mg/dL — ABNORMAL HIGH (ref 70–99)

## 2010-08-16 LAB — CBC
Hemoglobin: 10.5 g/dL — ABNORMAL LOW (ref 12.0–15.0)
MCH: 30.3 pg (ref 26.0–34.0)
MCHC: 32.4 g/dL (ref 30.0–36.0)
RDW: 18.5 % — ABNORMAL HIGH (ref 11.5–15.5)

## 2010-08-16 LAB — GLUCOSE, CAPILLARY: Glucose-Capillary: 79 mg/dL (ref 70–99)

## 2010-08-17 NOTE — Procedures (Unsigned)
VASCULAR LAB EXAM  INDICATION:  Follow up left bypass graft placement.  HISTORY: Diabetes:  No. Cardiac:  No. Hypertension:  Yes.  EXAM:  Left above-knee popliteal to below-knee popliteal bypass graft.  IMPRESSION: 1. Patent above-knee to below-knee left popliteal bypass graft. 2. Limited visualization at the proximal anastomosis due to post-     surgical edema and incisions. 3. Distal thigh hematoma noted, measuring 3.28 X 3.90 cm.  ___________________________________________ [name]. Fields, MD  EM/MEDQ  D:  08/09/2010  T:  08/09/2010  Job:  [id number]

## 2010-08-20 NOTE — Discharge Summary (Signed)
NAMEJENNEY, BRESTER              ACCOUNT NO.:  [id number]  MEDICAL RECORD NO.:  [id number]           PATIENT TYPE:  I  LOCATION:  2008                         FACILITY:  [facility]  PHYSICIAN:  [name]. Ayad Nieman, MD  DATE OF BIRTH:  Jan 04, 1933  DATE OF ADMISSION:  08/13/2010 DATE OF DISCHARGE:                        DISCHARGE SUMMARY - REFERRING   ADMISSION DIAGNOSIS:  Hematoma, left lower extremity.  PAST MEDICAL HISTORY AND DISCHARGE DIAGNOSES: 1. Hematoma, left lower extremity at the site of previous above-knee     to below-knee popliteal bypass, status post evacuation. 2. Atrial fibrillation, on chronic Coumadin. 3. Hyperlipidemia. 4. Syncope and collapse. 5. Diabetes mellitus. 6. Prior cerebrovascular accident. 7. Peripheral arterial disease, status post left above-knee to below-     knee popliteal bypass graft.  BRIEF HISTORY:  The patient is a 77 year old female who was approximately a week and a half status post left above-knee to below-the- knee popliteal bypass.  She was followed up in the office by Dr. Darrick Penna at which time she was complaining of tightness and pain in the left lower extremity surrounding the incisions.  She was having some tissue breakdown surrounding the incisions as well.  She had accumulated a hematoma postoperatively while on Coumadin therapy and Dr. Darrick Penna felt the patient should have evacuation of this.  HOSPITAL COURSE:  The patient was admitted and taken to the OR on August 13, 2010, for evacuation of hematoma in the left lower extremity.  The patient tolerated the procedure well and was hemodynamically stable immediately postprocedure.  The patient was taken from the OR to the post anesthesia care unit in stable condition.  She was extubated without complication and woke up from anesthesia neurologically intact.  The patient's postoperative course has progressed as expected.  She has worked with Physical Therapy and is improving, but will  need to continue intensive therapy after discharge.  She has remained afebrile with stable vital signs throughout the postoperative course.  Her hemoglobin and hematocrit have remained stable as well.  ABIs were completed on Aug 14, 2010, and are greater than 1 bilaterally.  The patient's Coumadin has been held throughout the postoperative course.  This will need to be restarted when she returns to skilled nursing facility where she was previously placed for rehab.  On Aug 16, 2010, the patient is without complaint.  She feels well.  She is voiding without difficulty and tolerating regular diet.  She is afebrile with stable vital signs.  PHYSICAL EXAMINATION:  CARDIAC:  Regular rate and rhythm. LUNGS:  Clear to auscultation. EXTREMITIES:  The bilateral lower extremities are warm and well perfused.  There is minimal edema in the left lower extremity.  The incisions are clean, dry, and intact, and healing well.  The patient is doing well and is felt stable for discharge back to skilled nursing facility for continuation of physical therapy rehab. Again, her Coumadin will need to be restarted once she returns to the facility.  LABORATORY DATA:  CBC on Aug 16, 2010, white count 6, hemoglobin 10.5, hematocrit 32.4, platelets 237.  BMP on Aug 14, 2010, sodium 140, potassium 38, BUN 12, creatinine  0.79.  DISCHARGE INSTRUCTIONS:  The patient received specific written discharge instructions regarding diet, activity, and wound care.  She will continue physical therapy at her skilled nursing facility per their instructions.  Coumadin will be restarted when she returns to skilled nurse facility, and her INR will continued to be monitored as it was prior to admission.  The patient will need to follow up with Dr. Darrick Penna in 1-2 weeks for evaluation of wound and staple removal.  The office will contact the patient with date and time of that appointment.  DISCHARGE MEDICATIONS: 1. Aspirin 81 mg  daily. 2. Acetaminophen 325 mg one to two q.4-6 h. p.r.n. pain. 3. Carvedilol 6.25 mg b.i.d. 4. Fish oil 1000 mg daily. 5. Glipizide 10 mg b.i.d. 6. Sliding scale NovoLog insulin t.i.d. with meals. 7. Lantus 10 units subcu nightly. 8. Lipitor 40 mg daily. 9. Metformin 500 mg 2 tablets p.o. b.i.d. after meals. 10.Multivitamin daily. 11.Nu-Iron 150 mg b.i.d. 12.Topamax 15 mg b.i.d. 13.Tramadol 50 mg one to two q.4-6 h. p.r.n. pain. 14.Coumadin 2.5 mg daily.     Pecola Leisure, PA   ______________________________ [name] Konnie Noffsinger, MD    AY/MEDQ  D:  08/16/2010  T:  08/16/2010  Job:  [id number]  Electronically Signed by Pecola Leisure PA on 08/16/2010 11:59:25 AM Electronically Signed by Fabienne Bruns MD on 08/20/2010 07:59:11 AM

## 2010-08-20 NOTE — Op Note (Signed)
  NAMEKAMEELAH, [name]              ACCOUNT NO.:  [id number]  MEDICAL RECORD NO.:  [id number]           PATIENT TYPE:  I  LOCATION:  2008                         FACILITY:  [facility]  PHYSICIAN:  [name]. Ahlam Piscitelli, MD  DATE OF BIRTH:  02/21/1933  DATE OF PROCEDURE:  08/13/2010 DATE OF DISCHARGE:                              OPERATIVE REPORT   PROCEDURE:  Evacuation of hematoma, left leg.  PREOPERATIVE DIAGNOSIS:  Hematoma, left leg.  POSTOPERATIVE DIAGNOSIS:  Hematoma, left leg.  INDICATIONS:  The patient is a 77 year old female who is approximately a week and a half after an above-knee to below-knee popliteal bypass.  She accumulated a hematoma postoperatively while on Coumadin therapy.  OPERATIVE FINDINGS:  Gelatinous hematoma, above-knee and below-knee popliteal incision, each approximately 5 x 5 cm in diameter.  OPERATIVE DETAILS:  After obtaining informed consent, the patient was taken to the operating.  The patient was placed in supine position on operating room table.  After induction of general anesthesia and endotracheal intubation, the patient's entire left lower extremity was prepped and draped in usual sterile fashion.  Next, a longitudinal incision was made through a preexisting wound on the medial aspect of the left leg.  On opening of the incision, there was a large amount of gelatinous old blood, which was under pressure.  This was all completely evacuated bluntly and using irrigation.  Collection was proximally 5 x 5 cm in diameter.  After all the blood had been removed, there was no obvious bleeding.  There was some muscle oozing and this was controlled with cautery.  The proximal aspect of the bypass graft was located, this had a pulse within it and seemed widely patent.  Attention was then turned to below-knee incision.  Longitudinal opening was made through the preexisting incision and a similar hematoma of similar size was also encountered.  This was also  again fully evacuated. Distal anastomosis of the bypass was located and this was also found to be patent with a palpable pulse within the artery.  This was also thoroughly irrigated with normal saline solution. Finally, there was a small incision above the above-knee incision approximately 3 cm in length from the previous saphenectomy, this was also reopened.  There was a small hematoma within this.  This was also completely evacuated and thoroughly irrigated with normal saline solution.  The above-knee and below-knee incisions were then closed with a running 3-0 Vicryl suture in the deep layer.  The skin of all 3 incisions were then closed with staples.  The patient tolerated the procedure well and there were no complications. Sponge and needle count was correct at the end of the case.  The patient was taken to recovery room in stable condition.     [name]. Samuella Rasool, MD     CEF/MEDQ  D:  08/14/2010  T:  08/15/2010  Job:  [id number]  Electronically Signed by Fabienne Bruns MD on 08/20/2010 07:59:07 AM

## 2010-08-23 ENCOUNTER — Ambulatory Visit: Payer: Medicare HMO | Admitting: Vascular Surgery

## 2010-08-28 NOTE — Op Note (Signed)
NAMERAMEEN, GOHLKE              ACCOUNT NO.:  [id number]   MEDICAL RECORD NO.:  [id number]          PATIENT TYPE:  AMB   LOCATION:  SDS                          FACILITY:  [facility]   PHYSICIAN:  Salley Scarlet., M.D.DATE OF BIRTH:  [birth year]   DATE OF PROCEDURE:  09/11/2007  DATE OF DISCHARGE:  09/10/2007                               OPERATIVE REPORT   PREOPERATIVE DIAGNOSIS:  Immature cataract, right eye.   POSTOPERATIVE DIAGNOSIS:  Immature cataract, right eye.   OPERATIONS:  Phacoemulsification cataract, right eye.   ANESTHESIA:  General.   JUSTIFICATION FOR PROCEDURE:  This is a 77 year old lady, who complains  of blurring of vision with difficulty to see, to read and to drive.  She  underwent a cataract extraction of the left eye several months ago with  good visual result.  She has dense cataract of the left eye, which is  causing her visual problems.  She was evaluated and found to have a  visual acuity best corrected to 20/60 on the right, and 20/40 on the  left.  Cataract extraction of the right eye was recommended with  intraocular lens implantation.  She is admitted at this time for that  purpose.  Because she is on Coumadin therapy and because of the  internist thought it would be best not to stop the Coumadin, the  procedure is being done under general anesthesia, so as to lessen the  risk of retrobulbar hemorrhage.   PROCEDURE:  Under the influence of general inhalation anesthesia, the  patient was prepped and draped in the usual manner.  The lid speculum  was inserted under the upper and lower lid of the right eye and a 4-0  silk traction suture was passed through the belly of the superior rectus  muscle retraction.  A fornix-based conjunctival flap was turned and  hemostasis was achieved using cautery.  An incision made in the sclera  at the limbus.  This incision was dissected down to clear cornea using a  crescent blade.  A sideport incision was made at  1:30 o'clock position.  OcuCoat was injected into the eye through the sideport incision.  The  anterior chamber was then entered through the corneoscleral tunnel  incision at 11:30 o'clock position.  An anterior capsulotomy  done using  a bent #25 gauge needle.  The nucleus was hydrodissected using  Xylocaine.  The KPE handpiece was passed into the eye, and the nucleus  was emulsified without difficulty.  The residual cortical material was  aspirated.  The posterior capsule was polished using an olive tip  polisher.  The wound was widened slightly to accommodate foldable  silicone lens.  The lens was seated into the eye behind the iris without  difficulty.  The anterior chamber was reformed and pupil was constricted  using Miochol.  The lips of the wound were hydrated and tested to make  sure that there was no leak.  After ascertaining that there was no leak,  the conjunctiva was closed over wound using thermal cautery.  1 mL of  Celestone and 0.5 mL of gentamicin were injected  subconjunctivally.  Maxitrol ophthalmic ointment and Pilopine ointment were applied along  with a patch and Fox shield.  The patient tolerated the procedure well  and was  discharged to the postanesthesia recovery room in satisfactory  condition.  She is instructed to rest today, to take Darvocet every 4  hours as needed for pain, and to see me in office tomorrow for further  evaluation.   DISCHARGE DIAGNOSIS:  Immature cataract, right eye.      Salley Scarlet., M.D.  Electronically Signed     TB/MEDQ  D:  09/11/2007  T:  09/12/2007  Job:  [id number]

## 2010-08-28 NOTE — Assessment & Plan Note (Signed)
[facility]                            CARDIOLOGY OFFICE NOTE   LAYANNA, [name]                     MRN:          [id number]  DATE:03/23/2008                            DOB:          Jun 13, 1932    The patient is a pleasant 77 year old female who has a history of  paroxysmal atrial fibrillation and prior CVA.  She had a Myoview  performed on Aug 27, 2005, that showed an ejection fraction of 80% and  normal perfusion.  Since she was last seen in December 2008, she is  doing reasonably well.  She apparently was admitted to [facility] in July secondary to left-sided numbness and paresthesias.  This was felt possibly secondary to a diabetic neuropathy.  Note, she  had an MRA/MRI of the brain on October 28, 2007, that showed no acute or  subacute infarct.  There were old lacunar infarct seen in the basal  ganglia, thalamus, and in the hemispheric deep white matter.  The MRA of  the neck was negative.  She denies any dyspnea, chest pain,  palpitations, or syncope.  There is no pedal edema.   MEDICATIONS:  At present include;  1. Coumadin as directed and followed in the Coumadin Clinic.  2. Norvasc 10 mg p.o. daily.  3. Multivitamin.  4. Metformin HCL.  5. Glipizide 10 mg p.o. b.i.d.  6. Lipitor 20 mg p.o. daily.  7. Aspirin 81 mg p.o. daily.  8. Fish oil 1200 mg daily.  9. Toprol 50 mg p.o. daily.  10.Lisinopril 10 mg p.o. daily.   PHYSICAL EXAMINATION:  VITAL SIGNS:  A blood pressure of 122/70 and her  pulse is 80.  She weighs 105 pounds.  HEENT:  Normal.  NECK:  Supple.  CHEST:  Clear.  CARDIOVASCULAR:  Regular rate and rhythm.  ABDOMEN:  No tenderness.  EXTREMITIES:  No edema.   Her electrocardiogram shows a sinus rhythm at a rate of 80.  The axis is  normal.  There are no significant ST changes.   DIAGNOSES:  1. Paroxysmal atrial fibrillation - the patient remains in sinus      rhythm today.  She will continue on her Toprol as  well as Coumadin      with a goal INR of 2-3.  2. Coumadin therapy - she will continue to be monitored in our      Coumadin Clinic.  We will check a CBC today to follow her      hemoglobin and hematocrit.  3. Prior cerebrovascular accident.  4. Hypertension - her blood pressure is controlled on her present      medications.  We will check a BMET to follow her potassium and      renal function.  5. Diabetes mellitus.  6. Hyperlipidemia - we will check lipids and liver and adjust as      indicated.  She will continue on her present dose of statin.   We will have her return in 1 year or sooner if necessary.     Madolyn Frieze Jens Som, MD, [facility]  Electronically Signed  BSC/MedQ  DD: 03/23/2008  DT: 03/24/2008  Job #: [id number]   cc:   Lorelle Formosa, M.D.

## 2010-08-28 NOTE — Discharge Summary (Signed)
NAMEABRAHAM, MARGULIES              ACCOUNT NO.:  [id number]   MEDICAL RECORD NO.:  [id number]          PATIENT TYPE:  OBV   LOCATION:  3735                         FACILITY:  [facility]   PHYSICIAN:  Elliot Cousin, M.D.    DATE OF BIRTH:  Oct 08, 1932   DATE OF ADMISSION:  10/28/2007  DATE OF DISCHARGE:  10/29/2007                               DISCHARGE SUMMARY   DISCHARGE DIAGNOSES:  1. Left-sided numbness/paresthesias.  Possibly secondary to diabetic      neuropathy.  2. Nonobstructive right carotid artery stenosis.  3. Paroxysmal atrial fibrillation.  4. Type 2 diabetes mellitus.  5. Hyperlipidemia.  6. Macrocytosis.   DISCHARGE MEDICATIONS:  1. Neurontin 100 mg b.i.d. p.r.n.  2. Lisinopril 10 mg daily.  3. Coumadin 2.5 mg every Monday, Wednesday and Friday and 5 mg on the      other days.  4. Norvasc 10 mg daily.  5. Metformin 500 mg 2 tablets b.i.d.  6. Glipizide 10 mg b.i.d.  7. Aspirin 81 mg daily.  8. Fish oil as directed.  9. Toprol XL 50 mg daily.  10.Lipitor 40 mg daily.  11.Multivitamin once daily.   DISCHARGE DISPOSITION:  The patient is being discharged home in improved  and stable condition.  She was advised to follow up with Dr. Ronne Binning in  1 week and with the Coumadin Clinic as scheduled.   CONSULTATIONS:  None.   PROCEDURES PERFORMED:  1. X-ray of the cervical spine on October 29, 2007.  The results revealed      minimal spondylosis at C5-C6.  No demonstrated acute osseous      findings.  2. MRA and MRI of the brain and neck on October 28, 2007.  The results      revealed no acute or subacute infarct.  Old lacunar infarctions in      the basal ganglia and thalami, as well as the hemispheric deep      white matter.  Medium to small vessel intracranial atherosclerotic      change with no major vessel occlusion identified.  MRA of the neck      vessels negative.  3. Carotid Doppler study.  The results revealed right 40-60% right ICA      stenosis and no stenosis  on the left.  4. A 2-D echocardiogram on October 28, 2007.  The results revealed no      obvious source of embolus.  Ejection fraction estimated to be 60%.   HISTORY OF PRESENT ILLNESS:  The patient is a 77 year old woman with a  past medical history significant for two previous strokes, paroxysmal  atrial fibrillation, and type 2 diabetes mellitus.  She presented to the  emergency department on October 28, 2007, with a chief complaint of left  upper and lower lip numbness and associated numbness of the left hand.  When the patient was evaluated in the emergency department, she was  noted to be mildly hypertensive, otherwise hemodynamically stable.  Her  lab data were unremarkable.  Her EKG revealed normal sinus rhythm.  A CT  scan of her head was ordered by the emergency  department physician and  it revealed no acute intracranial abnormality.  The patient was admitted  for further evaluation and management.   For additional details, please see the dictated history and physical.   HOSPITAL COURSE:  1. LEFT-SIDED NUMBNESS, PARESTHESIAS.  The patient was restarted on      all of her chronic cardiac medications, as well as glipizide and      metformin for treatment of diabetes mellitus.  At the time of the      initial hospital assessment, her INR was therapeutic at 2.8.      Coumadin therapy was continued.  For further evaluation of her      symptoms, an MRI of the brain, MRA of the head and neck, cardiac      enzymes, fasting lipid profile, 2-D echocardiogram, and carotid      Dopplers were ordered.  The MRI of the brain revealed no acute      intracranial findings.  The MRA of the brain and neck revealed no      significant large vessel stenosis.  The carotid Doppler study      revealed  40-60% ICA stenosis on the right and no evidence of      stenosis on the left.  The 2-D echocardiogram revealed preserved LV      function and no evidence of embolism.  Her cardiac enzymes were       completely normal.  Her homocysteine level was within normal limits      at 8.0.  Her TSH was within normal limits at 1.959.  Her lipid      profile revealed a total cholesterol of 116, triglycerides of 44,      HDL of 34, and LDL of 73.  In addition, vitamin B12 and RBC folate      were ordered for assessment.  Her RBC folate was within normal      limits at 577, and her vitamin B12 was within normal limits at 328.   Over the course of the hospitalization, the patients's numbness  completely resolved.  It is unclear what the etiology of her symptoms  were; however, it is possible that the patient may have an underlying  neuropathy secondary to diabetes.  To rule out significant cervical disk  disease, an x-ray of the cervical spine was ordered and it revealed only  minimal degenerative joint changes.  An outpatient MRI of the cervical  spine is an option; however, the decision to order an MRI of the  cervical spine will be deferred to the patient's primary care physician,  Dr. Ronne Binning.  The physical therapist and the occupational therapist  evaluated the patient and recommended no further therapy.  The patient  was alert and oriented throughout the hospitalization.  There were no  other cranial nerve or peripheral deficits.  1. PAROXYSMAL ATRIAL FIBRILLATION.  The patient's heart rate was well      controlled during the hospitalization.  She was maintained on      anticoagulation with Coumadin.  2. TYPE 2 DIABETES MELLITUS.  The patients capillary blood glucose was      fairly well-controlled during the hospitalization.  Her hemoglobin      A1c was found to be 7.5.  The patient was advised to continue      glipizide and metformin.  A decision to send the patient home on      Lantus was contemplated; however, the patient was advised to follow  up with Dr. Ronne Binning for further evaluation and management.  3. MACROCYTOSIS.  The patient's MCV ranged from 99.7-100.2 during the       hospitalization.  As indicated above, vitamin B12 and folate levels      were within normal limits.  Her TSH was within normal limits as      well.      Elliot Cousin, M.D.  Electronically Signed     DF/MEDQ  D:  10/29/2007  T:  10/29/2007  Job:  [id number]   cc:   Lorelle Formosa, M.D.  Madolyn Frieze Jens Som, MD, [facility]

## 2010-08-28 NOTE — H&P (Signed)
[name], [name]              ACCOUNT NO.:  [id number]   MEDICAL RECORD NO.:  [id number]          PATIENT TYPE:  INP   LOCATION:  1823                         FACILITY:  [facility]   PHYSICIAN:  Mobolaji B. Bakare, M.D.DATE OF BIRTH:  1932/06/25   DATE OF ADMISSION:  10/28/2007  DATE OF DISCHARGE:                              HISTORY & PHYSICAL   PRIMARY CARE PHYSICIAN:  Lorelle Formosa, M.D.   CHIEF COMPLAINT:  Numbness in left side of lip and left hand.   HISTORY OF PRESENTING COMPLAINT:  [name] is a 77 year old African  American female with past medical history of stroke, atrial  fibrillation.  She was laying down in bed about 11 p.m. last night when  she suddenly developed numbness on the left side of the upper and lower  lips, associated with numbness on palmar surface first to third fingers.  She decided to come to the hospital.  There was no associated weakness  facial droop, dysarthria, numbness on her lips have improved, but there  is still some residual numbness on left hand.  The patient has had a  head CT scan which showed no acute abnormality.  She has history of  atrial fibrillation and she had a PT/INR checked 2 days ago, INR was  2.7.  Currently, the patient is in normal sinus rhythm per EKG.   REVIEW OF SYSTEMS:  She denies chest pain, shortness of breath.  No  headaches.  No changes in vision.  No diarrhea, constipation, abdominal  pain, nausea, vomiting.  No diaphoresis.  No dysuria, urgency or  increased frequency of micturition.   PAST MEDICAL HISTORY:  1. Atrial fibrillation.  2. History of syncope.  3. History of stroke in 1999 and 2004 with residual dysarthria.  4. Type 2 diabetes mellitus.  5. Hyperlipidemia.  6. History of anemia.   PAST SURGICAL HISTORY:  Cesarean section.  Cataract extraction in March  and May 2009.   CURRENT MEDICATIONS:  1. Lisinopril 10 mg daily.  2. Coumadin. 2.5 mg on Monday, Wednesday and Friday, 5 mg on other   days.  3. Norvasc 10 mg daily.  4. Metformin 500 mg b.i.d.  5. Glipizide 10 mg b.i.d.  6. Aspirin 81 mg daily.  7. Fish oil.  8. Toprol XL 50 mg daily.  9. Lipitor 40 mg daily.  10.Multivitamin once daily.   ALLERGIES:  NO KNOWN DRUG ALLERGIES.   FAMILY HISTORY:  Noncontributory.   SOCIAL HISTORY:  The patient does not smoke cigarettes or drink alcohol.  She lives at home and is independent of activities of daily living.   PHYSICAL EXAMINATION:  VITAL SIGNS:  Temperature 97.4, blood pressure  160/80, pulse of 93, respiratory rate 20, O2 sats of 93%.  GENERAL:  The patient is awake, alert, oriented to time, place and  person.  HEENT:  Normocephalic, atraumatic.  Pupils equal, round and reactive to  light.  Extraocular muscle movement intact.  No elevated JVD.  There is  no facial asymmetry.  Mucous membranes moist.  No oral thrush.  LUNGS:  Clear clinically to auscultation.  CVS:  S1-S2,  regular.  No murmur or gallop.  ABDOMEN:  Not distended, soft, nontender.  Bowel sounds present.  EXTREMITIES:  No pedal edema or calf tenderness.  Dorsalis pedis pulses  palpable bilaterally.  CNS:  Muscle power 5/5 in all limbs.  Cranial nerves intact.  No facial  asymmetry.  Extraocular muscle movement intact.  Uvula is central.  Tongue has no deviation.  Finger-to-nose intact.  Plantar reflexes  downgoing.  She has no discernible sensory loss.   INITIAL LABORATORY DATA:  Sodium 141, potassium 4.5, chloride 103, CO2  28, glucose 186, BUN 8, creatinine 0.59.  Total bilirubin 0.8, alk-  phosphatase 69, AST 41, ALT 38, total protein 6.6, albumin 4.1, calcium  9.4.  Urinalysis unremarkable except for glucose 100.  White cells 6.5,  hemoglobin 12.4, hematocrit 37.1, platelets 197, monocytes 13%.  Cardiac  markers at the point of care within normal.  EKG shows normal sinus  rhythm.  No acute ST changes.   Head CT scan shows stable appearance of remote bilateral basilar ganglia  lacunar  infarct.  No acute intracranial abnormalities.   ASSESSMENT AND PLAN:  [name] is a 77 year old African American  female with history of atrial fibrillation, but she is currently in  normal sinus rhythm, presenting with numbness on the left side of her  lips and plantar surface of left first to third fingers.  She has no  focal weakness or dysarthria.  She will be admitted for probable TIA.  Stroke workup will be initiated.  The patient will be admitted for 24-  hour observation.  1. Paroxysmal atrial fibrillation.  The patient is currently normal      sinus rhythm.  Will check stat PT/INR.  Continue with Coumadin and      baby aspirin.  2. Diabetes mellitus.  This appears uncontrolled.  Will resume home      medications.  Check hemoglobin A1c and adjust medications as      necessary.  3. Hypertension.  This is currently controlled.  Will continue with      home medications.      Mobolaji B. Corky Downs, M.D.  Electronically Signed     MBB/MEDQ  D:  10/28/2007  T:  10/28/2007  Job:  [id number]   cc:   Lorelle Formosa, M.D.  Madolyn Frieze Jens Som, MD, [facility]

## 2010-08-28 NOTE — Assessment & Plan Note (Signed)
[facility]                            CARDIOLOGY OFFICE NOTE   [name], PATIENT                       MRN:          [id number]  DATE:04/03/2007                            DOB:          07-Jul-1932    Ms. Martine is a pleasant female previously followed by Dr. Glennon Hamilton  for atrial fibrillation and a history of CVA.  She had a previous  Myoview in Aug 27, 2005 that showed normal perfusion and an ejection  fraction of 80%.  She also had a previous echocardiogram performed in  July, 2005.  Her LV function was normal at that time.  Since she was  last seen, she denies any dyspnea, chest pain, palpitations, or syncope,  and there is no pedal edema.  She does feel pain in her legs at times,  but this is predominantly at night.   MEDICATIONS:  1. Lisinopril 10 mg p.o. daily.  2. Coumadin as directed.  3. Norvasc 10 mg p.o. daily.  4. Multivitamin.  5. Metformin 500 mg p.o. b.i.d.  6. Glipizide 10 mg p.o. b.i.d.  7. Aspirin 81 mg p.o. daily.  8. Fish oil.  9. Toprol 50 mg p.o. daily.  10.Lipitor 40 mg p.o. daily.   PHYSICAL EXAMINATION:  Blood pressure 119/68, pulse 80.  She weighs 105  pounds.  HEENT:  Unremarkable.  NECK:  Supple.  There are no bruits.  CHEST:  Clear.  CARDIAC:  Regular rate and rhythm.  ABDOMEN:  No tenderness.  EXTREMITIES:  No edema.   Her electrocardiogram shows a sinus rhythm at a rate of 74.  There are  occasional PACs.  The axis is normal.  There are no ST changes noted.   DIAGNOSES:  1. Paroxysmal atrial fibrillation:  The patient is in sinus rhythm      today.  She will continue on her Toprol as well as her Coumadin      with a goal INR of 2-3.  I will check a CBC today and follow her      hemoglobin and hematocrit.  2. Coumadin therapy:  This is being monitored in our Coumadin clinic      with a goal INR of 2-3.  3. Prior cerebrovascular accident.  4. Hypertension:  Blood pressure is adequately controlled on  her      present medications.  I will check a BMET and follow potassium and      renal function.  5. Diabetes mellitus:  Per her primary care physician.  6. Hyperlipidemia:  Will check lipids and liver and adjust, as      indicated.   We will see her back in one year.     Madolyn Frieze Jens Som, MD, [facility]  Electronically Signed    BSC/MedQ  DD: 04/03/2007  DT: 04/03/2007  Job #: [id number]   cc:   Lorelle Formosa, M.D.

## 2010-08-28 NOTE — Op Note (Signed)
NAMEJOCABED, CHEESE              ACCOUNT NO.:  [id number]   MEDICAL RECORD NO.:  [id number]          PATIENT TYPE:  AMB   LOCATION:  SDS                          FACILITY:  [facility]   PHYSICIAN:  Salley Scarlet., M.D.DATE OF BIRTH:  25-Nov-1932   DATE OF PROCEDURE:  DATE OF DISCHARGE:  07/09/2007                               OPERATIVE REPORT   PREOPERATIVE DIAGNOSES:  Immature cataract left eye.   POSTOPERATIVE DIAGNOSES:  Immature cataract left eye.   OPERATIONS:  Phacoemulsification cataract left eye.   ANESTHESIA:  General.   JUSTIFICATION FOR PROCEDURE:  This is a 77 year old lady who complains  of blurring of vision with difficulty seeing to read and drive.  She was  evaluated and found to have a visual acuity of 20/40 on the right, 20/60  on the left.  There was a dense 2+ nuclear sclerotic cataract with  posterior subcapsular opacities of the left eye.  There was a lesser  cataract of the right eye.  Cataract extraction with intraocular lens  implantation was recommended and she is admitted at this time that  purpose.  She is on Coumadin.  Her physician felt that it would be not  wise to discontinue the Coumadin.  Therefore for precautionary measures  the case is being done under general anesthesia rather than give her  retrobulbar block.   DESCRIPTION OF PROCEDURE:  Under the influence of general inhalation  anesthesia the patient was prepped and draped in the usual manner.  The  lid speculum was inserted under the upper and lower lid of the left eye  and a 4-0 silk traction suture was passed through the belly of the  superior rectus muscle retraction.  A fornix based conjunctival flap was  turned.  Hemostasis was achieved by using cautery.  An incision was made  in the sclera at the limbus.  This incision was dissected down into  clear cornea using crescent blade.  A sideport incision was made at the  1 to 3 o'clock position.  Ocucoat was injected into the eye  through the  sideport incision.  The anterior chamber was entered through the  corneoscleral tunnel incision at 11 to 3 o'clock position using a 2.75  mm keratome.  An anterior capsulotomy was then done using a bent 25  gauge needle.  The nucleus was hydrodissected using Xylocaine.  The KPE  handpiece was passed into the eye and the nucleus was emulsified without  difficulty.  The residual cortical material was aspirated.  The  posterior capsule was polished using olive tip polisher.  The wound was  widened slightly to accommodate a foldable silicone lens.  The lens was  seated into the eye behind the iris without difficulty.  The anterior  chamber was reformed and pupil constricted using Miochol.  The lips of  the wound were hydrated and tested to make sure there was no leak.  After ascertaining there was no leak, the conjunctiva was closed over  the wound using thermal cautery.  1 mL of Celestone and 0.5 mL of  gentamicin were injected subconjunctivally.  Maxitrol ophthalmic  ointment  and Polocaine ointment were applied along with a patch and Fox  shield.  The patient tolerated the procedure well, was discharged to  post anesthesia recovery room in satisfactory  condition.  She is instructed to rest today, to take Vicodin every 4  hours as needed for pain and see me in the office tomorrow for further  evaluation.   DISCHARGE DIAGNOSIS:  Immature cataract left eye.      Salley Scarlet., M.D.  Electronically Signed     TB/MEDQ  D:  07/14/2007  T:  07/14/2007  Job:  [id number]

## 2010-08-30 ENCOUNTER — Ambulatory Visit (INDEPENDENT_AMBULATORY_CARE_PROVIDER_SITE_OTHER): Payer: Medicare PPO | Admitting: Vascular Surgery

## 2010-08-30 DIAGNOSIS — IMO0002 Reserved for concepts with insufficient information to code with codable children: Secondary | ICD-10-CM

## 2010-08-30 DIAGNOSIS — I70219 Atherosclerosis of native arteries of extremities with intermittent claudication, unspecified extremity: Secondary | ICD-10-CM

## 2010-08-31 NOTE — Consult Note (Signed)
[name], [name]:  [id number]   MEDICAL RECORD NO.:  [id number]                   PATIENT TYPE:  INP   LOCATION:  1845                                 FACILITY:  [facility]   PHYSICIAN:  Elliot Cousin, M.D.                 DATE OF BIRTH:  March 17, 1933   DATE OF CONSULTATION:  11/01/2003  DATE OF DISCHARGE:                                   CONSULTATION   New to cardiology, no prior history of atrial fibrillation, no prior history  of coronary artery disease although she does have a strong family history of  coronary artery disease.   PRESENTING CIRCUMSTANCE:  I felt like I was having another stroke.   HISTORY OF PRESENT ILLNESS:  [name] is a 77 year old female.  She has a prior history of cerebrovascular accident x2.  Her last was in  October 2004.  At that time, she was evaluated and found to have a right  subcortical basal ganglia infarction.  Carotid ultrasound at that time was  negative for internal carotid artery stenosis.  The patient had had a right  posterior headache for the last 1-1/2 weeks which has radiated down her neck  into the right shoulder.  This morning at 12:30 approximately, she woke up  with increased pain from her head.  She got up and checked her serum blood  glucose which was 123 mg/dL.  Thinking that it would be low in the morning,  she went to the kitchen to get a snack.  Once there making toast, she felt  her legs going weak then collapsed.  She was unsure as to whether she lost  consciousness but she was confused slightly.  Her husband heard the fall.  She was able to answer him from the kitchen.  He rushed in, carried her to  the bedroom.  The patient's speech was clear and motor function was intact.  She told her husband that she feared that she had a repeat of her CVA.  He  took her to [facility] and she was found to be in atrial  fibrillation with rapid ventricular rate.  She is not having chest  pain with  this nor is she aware of palpitations.  She was started on IV Cardizem, IV  digoxin, IV Lopressor with heart rates in the 120s-130s at the time of this  consult.   ALLERGIES:  No known drug allergies.   MEDICATIONS:  1. Glipizide 10 mg b.i.d.  2. Glucophage 500 mg b.i.d.  3. Lipitor 10 mg at bedtime.  4. Lisinopril 20 mg daily.  5. Norvasc 10 mg daily.  6. Aggrenox 25 mg b.i.d.   PAST MEDICAL HISTORY:  No prior cardiac catheterization, no prior stress  studies.  1. CVA x2, the last one is October 2004 (dysarthria and dysphagia).  2. Type 2 diabetes.  She had gestational diabetes 30 years ago and  recurrence of diabetes 20 years ago.  3. Hypertension.  4. Dyslipidemia.  5. A strong family history of coronary artery disease.   PAST SURGICAL HISTORY:  1. Status post back surgery.  2. Status post C-section.  3. Laser surgery bilaterally for retinal hemorrhages.   SOCIAL HISTORY:  The patient lives in [place] with her husband of 43  years.  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  She  does not partake of alcoholic beverages.  For exercise, she does ride a  stationary bicycle.  Alcoholic beverage is very seldom.  No recreational  drugs.   FAMILY HISTORY:  Mother died at age 17 of a massive myocardial infarction.  Father died at age 42 of a stroke.  She has one brother who had either a  stroke or heart attack which caused his death.  One sister had a heart  attack with sudden death in her 59s.  One sister had two cerebral aneurysms,  the second one caused deceased and she was in her 17s.  One brother died of  cirrhosis but he did not drink alcohol.   REVIEW OF SYSTEMS:  The patient complains of chills especially after eating.  She eats with a jacket.  Weight change:  She has lost 108 to 99 in the last  year.  HEENT:  No epistaxis, voice changes, vertigo, or photophobia.  Integument:  No lesions or rashes.  CARDIOPULMONARY:  At this time,  no chest  pain, no shortness of breath, no dyspnea on exertion, no orthopnea, no  edema.  She did have syncope last night as described above.  UROGENITAL:  Occasional nocturia.  NEUROPSYCHIATRIC:  No indications here.  MUSCULOSKELETAL:  Pain in both knees when cycling and sometimes when  walking.  GI:  No history of gastroesophageal reflux disease or GI bleed.  She has had dysphagia since her stroke in October 2004.  ENDOCRINE:  She has  cold intolerance especially postprandial.  Symptomatology is unknown at the  present time.  She does have a prior history of diabetes and stroke.  No  history of pulmonary embolism or deep venous thrombosis.  No history of  seizure, myocardial infarction.   PHYSICAL EXAMINATION:  VITAL SIGNS:  Temperature 98.3, blood pressure at the  time of the examination 120/83, heart rate is 110, respirations 20.  GENERAL:  Alert and oriented x3.  No acute distress.  No chest pain.  Complaint of headache.  Pain in the right neck and shoulder accompanying her  headache for 1-1/2 weeks.  HEENT:  Eyes:  Pupils equal, round, and reactive to light.  Sclerae are  clear.  Mucous membranes pink and moist without lesion or erythema.  The  patient did wear dentures in the past.  NECK:  Supple.  No carotid bruits auscultated.  No jugular venous  distention.  No cervical lymphadenopathy.  HEART:  Irregular rate and rhythm without murmur.  There is no murmur on the  left decubital position.  LUNGS:  Clear to auscultation and percussion bilaterally.  INTEGUMENT:  No lesions or rashes.  ABDOMEN:  She has a well-healed cicatrix in the lower abdomen, soft,  nondistended.  Bowel sounds are present.  No hepatosplenomegaly.  Abdominal  aorta is nonpulsatile.  UROGENITAL/RECTAL:  Exams deferred.  EXTREMITIES:  Show no evidence of clubbing, cyanosis, edema, rashes,  lesions.  MUSCULOSKELETAL:  No joint deformity or effusions. NEUROLOGIC:  No focal deficits.  Grip strength is intact  bilaterally.  Speech is clear.  Radial  pulse is 4/4 bilaterally.  Palpable dorsalis pedis  pulses bilaterally.   Chest x-ray shows no active cardiopulmonary disease.  CT of the head since  the patient had fallen and hit her head on a table:  No acute intracranial  process.  She had a carotid ultrasound October 2004 showing no ICA stenosis.  Electrocardiogram:  Rate is 175 on admission, atrial fibrillation, rapid  ventricular rate, axis is +60, QRS interval 67, QTC is 433.   Complete blood count:  White cells 8.7, hemoglobin 13.4, hematocrit 39.7,  platelets 234.  Serum electrolytes:  Sodium 140, potassium 3.7, chloride  106, carbonate 23, BUN is 15, creatinine 0.7, glucose is 242.  D-dimer less  than 0.22.  Cardiac enzymes:  First set drawn at 3 o'clock in the morning:  CK of 74, CK-MB 1.5, troponin 0.02.   ASSESSMENT:  1. Admitted with atrial fibrillation with rapid ventricular rate, new onset.  2. Question of syncope.  3. Hypertension.  4. History of cerebrovascular accident x2 last in October 2004.  5. Strong family history of cerebrovascular accident/coronary artery     disease.  6. Dysphagia.  7. Cold intolerance.  8. Diabetes for the last 20 years.   PLAN:  It has been formulated by Dr. Corinda Gubler after interviewing the patient,  looking at the patient's history, and examining the patient.  1. Start IV heparin.  2. Discontinue Lovenox.  3. Agree with [facility] plans for cycling of cardiac enzymes, 2-D     echocardiogram, TSH, lipid profile.  4. Continue IV Cardizem and start p.o. Lopressor with Lopressor IV if needed     for heart rate greater than 120.  5. Transesophageal echocardiography/DCCV in the morning of June 20.  6. Possible Cardiolite at some point, probably as an outpatient.     Maple Mirza, P.A.                    Elliot Cousin, M.D.    GM/MEDQ  D:  11/01/2003  T:  11/01/2003  Job:  [id number]

## 2010-08-31 NOTE — Letter (Signed)
March 12, 2006    [name], M.D.  [phone] [address]  [place], [place] [address]   RE:  [name]  MRN:  [id number]  /  DOB:  June 28, 1932   Dear [name]:   It was a pleasure to see your nice patient, [name], for followup  on March 12, 2006.  As you know, she is a very pleasant 77 year old  black female with hypertension, diabetes, hyperlipidemia, prior CVA, and  paroxysmal atrial fibrillation.   The patient states that she has continued to get along quite well.  She  has had no symptoms of palpitations.  She has had no chest pain or  shortness of breath, and no presyncope.   Her recent LDL was 104.   MEDICATIONS:  1. Lipitor 20.  2. Glipizide 10 b.i.d.  3. Metformin 500 b.i.d.  4. Metoprolol 25 b.i.d.  5. Norvasc 10.  6. Coumadin.  7. Lisinopril 5.  8. Aspirin 81.   PHYSICAL EXAMINATION:  VITAL SIGNS:  Blood pressure 117/70, pulse 73,  normal sinus rhythm .  GENERAL APPEARANCE:  Normal.  NECK:  JVP is not elevated.  Carotid pulses palpable and equal without  bruits.  LUNGS:  Clear.  CARDIAC:  There is no murmur, gallop or rub.  No masses.  ABDOMEN:  Liver, spleen, and kidneys not palpable.  EXTREMITIES:  Normal.   IMPRESSION/DIAGNOSES:  As above.  The patient appears to be doing quite  well and has no cardiac symptoms.  I suggest that she change the  metoprolol 25 b.i.d. to Toprol-XL 50, and also suggested increasing the  Lipitor to 40 in hopes of decreasing her LDL below 70.   I suggested repeating the lipids in 3 months, and we will see her back  in a year, or at any other time you so desire.   ADDENDUM:  The EKG is normal, except for a few PAC's.   Thanks again for the opportunity to share in this nice patient's care.    Sincerely,      E. Graceann Congress, MD, [facility]  Electronically Signed    EJL/MedQ  DD: 03/12/2006  DT: 03/13/2006  Job #: [id number]

## 2010-08-31 NOTE — H&P (Signed)
[name], [name]                          ACCOUNT NO.:  [id number]   MEDICAL RECORD NO.:  [id number]                   PATIENT TYPE:  EMS   LOCATION:  MAJO                                 FACILITY:  [facility]   PHYSICIAN:  Gustavus Messing. Orlin Hilding, M.D.          DATE OF BIRTH:  1932/04/22   DATE OF ADMISSION:  02/14/2003  DATE OF DISCHARGE:                                HISTORY & PHYSICAL   CHIEF COMPLAINT:  Left-sided numbness.   HISTORY OF PRESENT ILLNESS:  [name] is a 77 year old right-handed black  woman with a history of hypertension and diabetes, previous stroke in 1999  with language and cognitive problems.  Yesterday evening, around 6:00 or  7:00, she developed left face and arm numbness.  She was unaware of any  weakness.  Legs were not involved.   REVIEW OF SYSTEMS:  Review of systems negative for chest pain, shortness of  breath or headache.  No vision problems, dizziness, nausea or vomiting.   PAST MEDICAL HISTORY:  Past medical history is significant for:  1. Diabetes.  2. Hypertension.  3. Stroke, presumably left brain, in 1999.  4. Status post lumbar disk surgery.  5. Previous C-section.  6. She may have a history of hyperlipidemia; she is on Lipitor; that might     have been just a preventative.   MEDICATIONS:  Plavix, aspirin, Glucophage, Glucotrol, Lipitor, Norvasc,  Prinivil.   ALLERGIES:  No known drug allergies.   SOCIAL HISTORY:  She is married.  No cigarette or drug use.  Rare alcohol.   FAMILY HISTORY:  Family history is positive for coronary artery disease and  stroke.   PHYSICAL EXAMINATION:  VITAL SIGNS:  Temperature is 97.8, pulse 98, blood  pressure 158/70, respirations 26.  GENERAL:  She is very thin.  HEENT:  Head is normocephalic, atraumatic.  NECK:  Neck is without bruits.  HEART:  Regular rhythm.  I think she may have a quiet systolic murmur.  LUNGS:  Lungs are clear to auscultation.  ABDOMEN:  Abdomen positive.  Positive bowel  sounds.  EXTREMITIES:  Extremities without edema.  NEUROLOGIC:  Mental status:  She is awake, alert, a little bit hesitant, but  normal naming, repetition and comprehension in general.  Cranial nerves II:  Pupils are equal and reactive.  Visual fields are full.  Extraocular  movements are intact.  Facial sensation is diminished in left V2 and 3  distributions.  Facial motor activity, however, is normal, hearing is  intact, palate is symmetric and tongue is midline.  On motor exam, she has  normal bulk, tone and strength throughout, with the exception of some mild  left weakness in biceps, triceps and deltoids, about 4+/5.  She has mildly  decreased rapid fine movement on the left as compared to the right but no  drift.  Lower extremities are normal.  Reflexes are 3+ on the right, 2+ on  the left with  downgoing toes.  Coordination:  Finger-to-nose and heel-to-  shin are intact.  Sensory exam is decreased to multiple modalities in the  left upper extremity, normal in the lower extremities.   LABORATORY AND ACCESSORY CLINICAL DATA:  White blood cell count 6.9,  hemoglobin 13.7, hematocrit 40.8, platelets 262,000.  PT 12.8, INR of 0.9,  PTT 34.  CMET is pending.   EKG is pending.   Chest x-ray pending.   IMPRESSION:  Suspect right brain subcortical infarct with left face and arm  numbness and mild weakness in a patient with diabetes and hypertension.   PLAN:  Admit.  We will continue aspirin and Plavix for now but will probably  need some adjustment because of this breakthrough stroke or TIA.  We will  need to check an MRI scan of the brain, MR angiogram, 2-D echo, carotid  ultrasound, transcranial Doppler.  Therapies as needed.                                                Catherine A. Orlin Hilding, M.D.    CAW/MEDQ  D:  02/14/2003  T:  02/14/2003  Job:  [id number]

## 2010-08-31 NOTE — H&P (Signed)
[name], PEYSER                          ACCOUNT NO.:  [id number]   MEDICAL RECORD NO.:  [id number]                   PATIENT TYPE:  EMS   LOCATION:  MAJO                                 FACILITY:  [facility]   PHYSICIAN:  Jonna L. Robb Matar, M.D.            DATE OF BIRTH:  1932/07/28   DATE OF ADMISSION:  11/01/2003  DATE OF DISCHARGE:                                HISTORY & PHYSICAL   CHIEF COMPLAINT:  I hit my head.   HISTORY:  This 77 year old African-American female has had a two-week  history of funny right occipital headache or sensation going down the back  of her head which she had not had before.  Today she woke up around 12:15  a.m. again with this funny headache, thought maybe it was due to her sugars.  She checked her sugar which was actually normal at 1:32 when she decided to  eat a sandwich.  Her husband heard a plunk and came into the kitchen to find  that she had fallen and hit her head, even still had a piece of bread in her  mouth.  The patient does not remember losing consciousness.  She did notice  that the left side of her tongue, which always has been slightly thick since  the precious CVA, seemed thicker than usual, and she was having tingling in  her left hand and face.  She thinks that has slightly improved she has  gotten to the emergency room.   PAST MEDICAL HISTORY:  1. CVA.  She has had two previous ones; one in 1999, the other in 2004, that     have mostly affected her speech, her ability to count, and she is     slightly paraphasic.  2. Type 2 diabetes.  She had gestational diabetes 30 years ago, went away,     reoccurred about 20 years ago, and she has needed to be on medications     since.  3. Hypertension.  4. Hypercholesterolemia.   ALLERGIES:  None.   PAST SURGICAL HISTORY:  1. Back surgery.  2. C section.  3. Bilateral laser surgery in her eyes due to the diabetes.   FAMILY HISTORY:  CVA, MI, and MI in a sister as well, and on sister  has had  an aneurysm.   MEDICATIONS:  1. Glipizide 10 b.i.d.  2. Glucophage 500 b.i.d.  3. Lipitor 10 daily.  4. Lisinopril 20 daily.  5. Norvasc 10 daily.  6. Aggrenox 25 b.i.d.  7. She had been on Plavix until she had the second stroke, and then it was     discontinued and switched to Aggrenox.   REVIEW OF SYSTEMS:  The patient has been told she is getting early  cataracts.  She has not been aware of any previous episodes of palpitations.  She has been told of a little bit of arthritis.  Other Review of Systems  were negative including  kidney problems, chest pain, heart disease, blood  clots.  She has had some weight loss from 108 pounds down to 99.   PHYSICAL EXAMINATION:  GENERAL: Well-developed, thin, African-American  female.  VITAL SIGNS:  Presently her blood pressure is 125/74, pulse 132,  respirations 24, 99% saturation.  HEENT:  Conjunctivae and lids are normal.  Pupils equal, round, and reactive  to light.  EOMs full.  She has normal hearing, nose, and pharynx.  NECK:  Supple without masses, thyromegaly, or carotid bruits.  CHEST:  Respiratory effort is normal.  Lungs clear to auscultation and  percussion.  No wheezing, rales, rhonchi, or dullness.  HEART:  Very tachycardia, irregularly irregular.  Normal S1 and S2 without  murmurs or rubs.  There were no carotid bruits, no cyanosis, clubbing, or  edema.  BREASTS:  No masses, tenderness, or discharge.  ABDOMEN:  Nontender, normal bowel sounds.  No hepatosplenomegaly or hernias.  EXTERNAL GENITALIA:  Normal.  EXTREMITIES:  She has 5/5 strength in all four extremities, full range of  motion.  SKIN:  No skin rashes, lesions, or nodules.  NEUROLOGIC:  Cranial nerves II-XII intact.  EOMs are full.  DTRs 2+ in the  knees.  She has decreased sensation on the left upper lip and tingling in  left hand.  Alert and oriented x 3.  Normal memory, judgment, and affect.   LABORATORY AND X-RAY DATA:  EKG shows a rapid atrial  fibrillation.  Repeat  went from rate of 175 down to 119.  No other laboratory work is available at  this time.   IMPRESSION:  1. Atrial fibrillation with rapid ventricular response.  Responds well to     Cardizem.  She will put on a drip, worked up for courses of atrial     fibrillation including ischemic heart disease, hypothyroidism, and     pulmonary embolus.  2. Hemi-sensory defect.  Rule out new on top of old cerebrovascular     accidents.  Evaluate her for dysphagia if this does not clear up.  She     will get a CT scan now and may end up needing an MRI as well.  3. Type 2 diabetes.  I will continue her medications, put her on sliding     scale insulin, hold her metformin since she will be getting dye studies.  4. Hypertension.  She has been maintaining her blood pressure even with the     tachycardia.  I am going to decrease her Norvasc.  5. Hypercholesterolemia.                                                Jonna L. Robb Matar, M.D.    Dorna Bloom  D:  11/01/2003  T:  11/01/2003  Job:  [id number]

## 2010-08-31 NOTE — Discharge Summary (Signed)
[name], [name]                          ACCOUNT NO.:  [id number]   MEDICAL RECORD NO.:  [id number]                   PATIENT TYPE:  INP   LOCATION:  4735                                 FACILITY:  [facility]   PHYSICIAN:  Pramod P. Pearlean Brownie, MD                 DATE OF BIRTH:  02/01/1933   DATE OF ADMISSION:  02/13/2003  DATE OF DISCHARGE:  02/16/2003                                 DISCHARGE SUMMARY   ADMISSION DIAGNOSIS:  Left-sided numbness.   DISCHARGE DIAGNOSES:  1. 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  2. Diabetes.  3. Hypertension.  4. Hyperlipidemia.  5. Old lacunar infarction.   HISTORY:  The patient is a 77 year old pleasant, right-handed black woman  who was admitted for evaluation for sudden onset left face and arm numbness.   PROCEDURES DONE:  1. CT scan of the head.  2. MRI scan of the brain with MRA of the brain.  3. Carotid ultrasound.  4. Transcranial Doppler.  5. Echocardiogram.   HOSPITAL COURSE:  The patient was admitted for evaluation for sudden onset  of left-sided face and arm numbness.  On evaluation in the emergency room,  she was found to have minimum left upper extremity weakness with diminished  rapid finger tapping movements on the left compared to the right.  There was  no left extremity weakness.  Noncontrasted CAT scan of the head revealed  evidence of old lacunar infarcts but no acute findings.  She was admitted  for stroke risk workup.  She was kept on telemetry monitoring which did not  reveal any cardiac arrhythmias.  An MRI scan of the brain was obtained the  next day which revealed a small right basal ganglia subcortical infarction.  An MRI revealed small posterior limb internal capsule infarction also  extending into the corona radiata.  An MRA revealed mild atherosclerotic  disease involving bilateral distal middle cerebral artery branches as well a  distal vertebral artery and proximal  portions of basilar artery.  Carotid  ultrasound revealed no significant stenosis of either carotid arteries in  the neck.  Transcranial Doppler studies revealed normal mean flow  velocities.  Cardiac echo did not reveal any obvious cardioembolic source.  Stroke laboratories revealed elevated hemoglobin A1C of 7.4%.  Cholesterol  was elevated at 208.  LDL was elevated at 128.  Homocysteine was normal.  The patient showed some improvement of left-sided numbness and left-sided  weakness.   DISCHARGE INSTRUCTIONS:  Diabetes education nurse was consulted, and the  patient was advised to have outpatient diabetes teaching and education done.  She was advised to follow up with her primary physician in one week and with  Dr. Pearlean Brownie in two weeks.  She was on aspirin and Plavix prior to admission.  This was discontinued, and she was changed to Aggrenox 1 capsule twice a day  for secondary stroke prevention.  DISCHARGE MEDICATIONS:  1. Aggrenox 1 capsule twice a day.  2. She was advised to continue her home dosage of Glucotrol, Glucophage,     Lipitor, Norvasc and Prinivil.   FOLLOWUP:  1. Primary physician in one week.  2. Dr. Pearlean Brownie in two weeks.                                                Pramod P. Pearlean Brownie, MD    PPS/MEDQ  D:  03/15/2003  T:  03/15/2003  Job:  [id number]

## 2010-08-31 NOTE — Assessment & Plan Note (Signed)
OFFICE VISIT  [name], [name] DOB:  [birth year]                                       08/30/2010 [id number]#:[id number]  The patient returns for followup today after evacuation of a hematoma from an above-knee to below-knee popliteal bypass.  The patient states that her leg has improved significantly since the procedure.  She is walking some and gradually increasing her exercise tolerance.  On physical exam today, blood pressure is 174/79 in the left arm.  Heart rate is 90 and regular.  Temperature is 98.4.  Lower extremity:  All incisions are now well-healed.  She does still have some trace edema in the left lower extremity.  All staples were removed today.  At this point, I believe the patient is starting to recover from her recent popliteal bypass.  She has reasonable perfusion to her right foot.  She will need to follow up with a 28-month duplex ultrasound of her bypass graft to make sure that she has not had any re-narrowing.  We will see her again at that time.    [name]. Fields, MD Electronically Signed  CEF/MEDQ  D:  08/30/2010  T:  08/31/2010  Job:  [id number]  cc:   [name], M.D.

## 2010-08-31 NOTE — Discharge Summary (Signed)
[name], [name] NO.:  [id number]   MEDICAL RECORD NO.:  [id number]                   PATIENT TYPE:  INP   LOCATION:  2031                                 FACILITY:  [facility]   PHYSICIAN:  Elliot Cousin, M.D.                 DATE OF BIRTH:  1932/08/11   DATE OF ADMISSION:  11/01/2003  DATE OF DISCHARGE:  11/07/2003                                 DISCHARGE SUMMARY   DISCHARGE DIAGNOSES:  1. Newly diagnosed atrial fibrillation with a rapid ventricular response     (heart rate 175 on admission).  2. Syncope secondary to #1.  3. History of acute stroke in 1999 and 2004 with residual dysarthria.  4. Type 2 diabetes mellitus.  5. Hyperlipidemia.  6. Normocytic anemia.   SECONDARY DIAGNOSES:  1. History of back surgery.  2. History of cesarean section.  3. Status post bilateral laser surgery both eyes due to diabetic     retinopathy.   DISCHARGE MEDICATIONS:  1. Lipitor 40 mg half tablet daily.  2. Norvasc 10 mg half tablet daily.  3. Lisinopril 5 mg daily.  4. Lopressor 50 half tablet b.i.d.  5. Coumadin 5 mg pills, take half pill on odd number days and take one whole     pill on even number days.  6. Glucotrol 10 mg b.i.d.  7. Glucophage 500 mg b.i.d.  8. Centrum Silver vitamin one tablet daily.   DISCHARGE DISPOSITION:  The patient was discharged to home on November 07, 2003  in improved and stable condition.  She has a followup appointment with  primary care physician Dr. Parke Simmers on August 1 at 3 p.m.  She has an  appointment with [facility] on Wednesday July 27 at 11:15 a.m.  She  has a stress test scheduled for Thursday July 28 at 9:45 a.m.  She has an  office visit with cardiologist Dr. Graceann Congress on August 12 at 10:45 a.m.   PROCEDURE PERFORMED:  1. CT scan of the head without contrast, November 01, 2003.  The results     revealed mild atrophy and chronic small vessel disease.  Old bilateral     lacunar infarcts.  No acute  intracranial abnormality.  2. 2-D echocardiogram on November 01, 2003.  The results revealed that the left     ventricular systolic function was normal.  Left ventricular ejection     fraction estimated at 55-65%.  Aortic valve was mildly calcified.  The     left atrium was mildly dilated.   HISTORY OF PRESENT ILLNESS:  The patient is a 77 year old African-American  woman with past medical history significant for two strokes in the past with  the latest being in 2004, type 2 diabetes mellitus and hypertension who  presented to the emergency department with chief complaint of a two-week  history of a funny right occipital headache or sensation going down the back  of her head which she had not had before.  On the day of admission, the  patient woke up around 12:15 a.m. and again had this funny feeling in her  head and a headache.  The patient thought that it may have been secondary to  low blood sugars.  However, the patient checked her blood sugar and it was  within normal limits.  The patient decided to eat a sandwich at around 1:30  a.m.  Her husband heard a noise in the kitchen and came running in and found  that the patient had fallen on the floor.  She hit her head.  She still had  a piece of bread in her mouth.  The patient was alert and oriented in the  emergency department when she arrived.  She initially denied having any  symptoms of heart palpitations, however, later in the hospital course she  did remember that she had some intermittent sensations of a rapid heartbeat  and palpitations a few days ago.  When the patient arrived to the emergency  department, her heart rate was initially 175 beats/minute. At the time of  Dr. Donato Heinz assessment, the patient's heart rate was 119.  The patient  was found to be in atrial fibrillation with a rapid ventricular response.   HOSPITAL COURSE:  1. Atrial fibrillation with rapid ventricular response.  Syncope secondary     to the rapid  atrial fibrillation.  The initial management started in the     emergency department when the patient was given a bolus of IV Cardizem     and then placed on a Cardizem drip.  The patient initially responded,     however, her heart rate rebounded back into the 140s and 150s.  The     patient was therefore given a dose of Lopressor 5 mg IV x1 and a bolus of     digoxin 0.25 mg IV x1.  She was started on intravenous heparin, dosing     per pharmacy.  She was treated with nasal cannula oxygen two liters per     minute, titrated to keep her oxygen saturations greater than 92%.  She     was also treated with as-needed nitroglycerin sublingual.  However, the     patient had no complaints of chest pain.  The patient's neurological exam     was intact on admission.  She was alert and oriented and had no apparent     deficits neurologically.  Given that the patient had a syncopal episode     at home and also given that the patient has a history of two strokes, a     CT scan of the head was ordered for further evaluation.  The CT scan of     the head revealed no acute abnormalities intracranially.  However, it did     reveal old bilateral strokes.  For further evaluation of the newly     diagnosed atrial fibrillation, a 2-D echocardiogram was ordered, thyroid     function tests were ordered, and cardiac enzymes were collected every     eight hours x3.  In addition, the patient's fasting lipid panel,     magnesium, and hemoglobin A1c were assessed.  The cardiac enzymes were     completely negative x3 sets.  The patient's lipid panel revealed a total     cholesterol of 157, triglycerides 88, HDL cholesterol 50 and LDL 89.  The     total T4 was 9.0  within normal limits and the TSH was 3.025 also within     normal limits.  The patient's hemoglobin A1c was 6.9 indicating     relatively good control in the outpatient setting.  The magnesium level     was 2.0.  [place] [facility] was consulted for further  evaluation and management.  Dr.  Graceann Congress provided the initial consultation.  He recommended starting  Lopressor at 25 mg p.o. b.i.d.  He also recommended intravenous Lopressor at  5 mg IV every 30 minutes until the patient's heart rate was less than 110.  The Cardizem drip was titrated off.  The Lopressor would have been titrated  up to 50 mg b.i.d., however, the patient's heart rate normalized at  Lopressor 25 mg b.i.d.  The dose of Norvasc was decreased to 5 mg daily and  the lisinopril was temporarily discontinued to avoid hypotension.  The  tentative plan at the time of the cardiologist initial evaluation was that  the patient would be cardioverted the following morning.  The patient would  also be evaluated with TEE.  However, the following morning the patient  converted to normal sinus rhythm.  The TEE and the planned  radioablation/cardioversion was cancelled.  The patient's Aggrenox was  discontinued.  She was subsequently started on Coumadin for long term  treatment of paroxysmal atrial fibrillation.  The 2-D echocardiogram  revealed that the patient had a normal left ventricular systolic function.  The ejection fraction was estimated at 55-65%.  There was no evidence of a  cardiac source of embolism.  There was no wall motion abnormalities noted.  The aortic valve was mildly calcified and the left atrium was mildly  dilated.   Followup EKGs revealed normal sinus rhythm with occasional PACs.  The  patient was maintained on Lopressor 25 mg b.i.d. which kept the patient's  heart rate within normal limits.  Lisinopril at 5 mg p.o. daily was started  for nephro protection. This is a significant reduction in the previous dose  of 20 mg of lisinopril per day that the patient had been treated with  outpatient.  The patient's  Norvasc also had to be decreased from 10 mg  daily to 5 mg daily after the Lopressor was started.  The patient was  previously treated with 10 mg of Lipitor  each day for hyperlipidemia.  However, the dose was increased to 20 mg q.h.s. for a further reduction in  the LDL cholesterol.  The patient was transitioned over to Lovenox during  hospital course.  Coumadin was dosed per pharmacy.  When the patient's INR  became therapeutic at 2.2 she was discharged to home on Coumadin 5 mg daily  alternating with 2.5 mg daily.  [facility] cardiology team arranged for the  patient to be followed by the [facility].  She will also be scheduled  for an outpatient stress test on July 28 at 9:45 a.m.  The patient was in no  acute distress and stable at the time of hospital discharge.   1. Type 2 diabetes mellitus.  The patient was managed outpatient with     Glucotrol and Glucophage.  The Glucophage was discontinued during the     hospital course secondary to hospital policy.  The patient was maintained     on Glucotrol 10 mg b.i.d.  A sliding  scale insulin regimen and Lantus    were started to control the patient's capillary blood glucose.  During     the hospital course,  the patient's capillary blood glucose ranged between     140 and 170.  The sliding scale insulin regimen and Lantus were     discontinued at hospital discharge.  The patient was advised to resume     Glucophage at 500 mg b.i.d. and Glucotrol 10 mg b.i.d.  The patient's     hemoglobin A1c during the hospital course was 6.9.   1. Normocytic anemia.  The patient's hemoglobin ranged between 11.3 and 13.4     during hospital course.  Her MCV ranged between 98.9 and 99.6.  The     patient had no overt signs of bleeding during the hospital course.  She     was treated with multivitamin with iron.  The patient may need an     outpatient colonoscopy.  This decision will be deferred to her primary     care physician Dr. Parke Simmers.   CONSULTATIONS:  1. Dr. Graceann Congress.  2.                                                Elliot Cousin, M.D.    DF/MEDQ  D:  11/08/2003  T:  11/08/2003   Job:  [id number]   cc:   [name], M.D.  [phone] [address], [address]  [place]  [place] [address]  Fax: [phone]   E. Graceann Congress, M.D.

## 2010-08-31 NOTE — Op Note (Signed)
NAMEMEELAH, [name]              ACCOUNT NO.:  [id number]   MEDICAL RECORD NO.:  [id number]          PATIENT TYPE:  AMB   LOCATION:  ENDO                         FACILITY:  [facility]   PHYSICIAN:  Bernette Redbird, M.D.   DATE OF BIRTH:  1933-01-05   DATE OF PROCEDURE:  04/19/2004  DATE OF DISCHARGE:                                 OPERATIVE REPORT   PROCEDURE:  Colonoscopy with biopsy.   ENDOSCOPIST:  Bernette Redbird, M.D.   INDICATION:  This is a 77 year old female with intermittent diarrhea felt  most likely to be IBS, also in need of colon cancer screening.   FINDINGS:  Normal exam to the cecum.   PROCEDURE:  The nature, purpose and risks of the procedure had been  discussed with the patient who provided consent. The patient is maintained  on Coumadin for atrial fibrillation and the medication was continued prior  to this exam, with an INR of 2.3 on the morning of the exam. Sedation was  fentanyl 50 mcg and Versed 3 mg IV without  desaturation or clinical  instability. The Olympus adjustable tension pediatric video colonoscope was  advanced quite easily around the colon, using some external abdominal  compression to control looping. The cecum was identified by clear  visualization of the appendiceal orifice and pullback was then performed.  The quality of prep was excellent and it was felt that all areas were well  seen.   This was a normal examination. Specifically, I saw no source of diarrhea  such as colitis. There was no evidence of cancer, polyps or vascular  malformations or diverticular disease.  I did not retroflex in the rectum  due to a small rectal ampulla.   Several random mucosal biopsies were obtained to help rule out cholangitis,  colitis or microscopic colitis; there was no significant bleeding from any  of  the biopsy sites.  The patient tolerated the procedure well and there no  apparent complications.   IMPRESSION:  Diarrhea, without source evident on  current examination,  (564.5).   PLAN:  Await pathology results.       RB/MEDQ  D:  04/19/2004  T:  04/19/2004  Job:  [id number]   cc:   Lorelle Formosa, M.D.  [phone] [address]  [place]  [place] [address]  Fax: [phone]   E. Graceann Congress, M.D.

## 2010-09-06 ENCOUNTER — Ambulatory Visit (INDEPENDENT_AMBULATORY_CARE_PROVIDER_SITE_OTHER): Payer: Medicare PPO | Admitting: *Deleted

## 2010-09-06 DIAGNOSIS — I4891 Unspecified atrial fibrillation: Secondary | ICD-10-CM

## 2010-09-13 ENCOUNTER — Ambulatory Visit (INDEPENDENT_AMBULATORY_CARE_PROVIDER_SITE_OTHER): Payer: Medicare PPO | Admitting: *Deleted

## 2010-09-13 ENCOUNTER — Encounter: Payer: Medicare PPO | Admitting: *Deleted

## 2010-09-13 DIAGNOSIS — I4891 Unspecified atrial fibrillation: Secondary | ICD-10-CM

## 2010-09-13 LAB — POCT INR: INR: 2.7

## 2010-09-20 ENCOUNTER — Ambulatory Visit (INDEPENDENT_AMBULATORY_CARE_PROVIDER_SITE_OTHER): Payer: Medicare PPO | Admitting: *Deleted

## 2010-09-20 DIAGNOSIS — I4891 Unspecified atrial fibrillation: Secondary | ICD-10-CM

## 2010-09-20 LAB — POCT INR: INR: 2.4

## 2010-10-18 ENCOUNTER — Ambulatory Visit (INDEPENDENT_AMBULATORY_CARE_PROVIDER_SITE_OTHER): Payer: Medicare PPO | Admitting: *Deleted

## 2010-10-18 DIAGNOSIS — I4891 Unspecified atrial fibrillation: Secondary | ICD-10-CM

## 2010-11-15 ENCOUNTER — Ambulatory Visit (INDEPENDENT_AMBULATORY_CARE_PROVIDER_SITE_OTHER): Payer: Medicare PPO | Admitting: *Deleted

## 2010-11-15 DIAGNOSIS — I4891 Unspecified atrial fibrillation: Secondary | ICD-10-CM

## 2010-11-26 ENCOUNTER — Other Ambulatory Visit: Payer: Self-pay | Admitting: Cardiology

## 2010-12-06 ENCOUNTER — Encounter (INDEPENDENT_AMBULATORY_CARE_PROVIDER_SITE_OTHER): Payer: Medicare PPO

## 2010-12-06 DIAGNOSIS — I7092 Chronic total occlusion of artery of the extremities: Secondary | ICD-10-CM

## 2010-12-06 DIAGNOSIS — Z48812 Encounter for surgical aftercare following surgery on the circulatory system: Secondary | ICD-10-CM

## 2010-12-13 ENCOUNTER — Ambulatory Visit (INDEPENDENT_AMBULATORY_CARE_PROVIDER_SITE_OTHER): Payer: Medicare PPO | Admitting: *Deleted

## 2010-12-13 DIAGNOSIS — I4891 Unspecified atrial fibrillation: Secondary | ICD-10-CM

## 2010-12-13 LAB — POCT INR: INR: 1.6

## 2010-12-27 ENCOUNTER — Ambulatory Visit (INDEPENDENT_AMBULATORY_CARE_PROVIDER_SITE_OTHER): Payer: Medicare PPO | Admitting: *Deleted

## 2010-12-27 DIAGNOSIS — I4891 Unspecified atrial fibrillation: Secondary | ICD-10-CM

## 2011-01-04 ENCOUNTER — Encounter: Payer: Self-pay | Admitting: Vascular Surgery

## 2011-01-04 NOTE — Procedures (Unsigned)
BYPASS GRAFT EVALUATION  INDICATION:  Left lower extremity bypass graft.  HISTORY: Diabetes:  Yes. Cardiac:  No. Hypertension:  Yes. Smoking:  No. Previous Surgery:  Left above knee to below knee popliteal bypass graft on 07/23/2010.  SINGLE LEVEL ARTERIAL EXAM                              RIGHT              LEFT Brachial: Anterior tibial: Posterior tibial: Peroneal: Ankle/brachial index:  PREVIOUS ABI:  Date: RIGHT:  LEFT:  LOWER EXTREMITY BYPASS GRAFT DUPLEX EXAM:  DUPLEX:  Monophasic Doppler waveforms noted throughout the right lower extremity bypass graft with no increased velocities noted.  IMPRESSION: 1. Patent left popliteal artery bypass graft with no elevated     velocities visualized. 2. Decreased visualization of the bypass graft noted due to anatomic     limitations. 3. Ankle brachial indices are noted on separate report.        ___________________________________________ [name]. Fields, MD  CH/MEDQ  D:  12/11/2010  T:  12/11/2010  Job:  [id number]

## 2011-01-07 ENCOUNTER — Ambulatory Visit (INDEPENDENT_AMBULATORY_CARE_PROVIDER_SITE_OTHER): Payer: Medicare PPO | Admitting: *Deleted

## 2011-01-07 DIAGNOSIS — I4891 Unspecified atrial fibrillation: Secondary | ICD-10-CM

## 2011-01-07 LAB — URINALYSIS, ROUTINE W REFLEX MICROSCOPIC
Bilirubin Urine: NEGATIVE
Ketones, ur: NEGATIVE
Nitrite: NEGATIVE
Urobilinogen, UA: 0.2

## 2011-01-07 LAB — CBC
HCT: 40.4
HCT: 39.6
MCV: 98.6
MCV: 98.8
Platelets: 195
Platelets: 203
RDW: 13.9
RDW: 14.2

## 2011-01-07 LAB — BASIC METABOLIC PANEL
BUN: 12
BUN: 8
Chloride: 101
Chloride: 98
Glucose, Bld: 153 — ABNORMAL HIGH
Glucose, Bld: 197 — ABNORMAL HIGH
Potassium: 3.6
Potassium: 4.2

## 2011-01-08 ENCOUNTER — Other Ambulatory Visit: Payer: Self-pay | Admitting: Cardiology

## 2011-01-09 LAB — BASIC METABOLIC PANEL
BUN: 11
Chloride: 103
Glucose, Bld: 117 — ABNORMAL HIGH
Potassium: 3.8

## 2011-01-09 LAB — CBC
HCT: 39.9
MCV: 99
Platelets: 221
RDW: 14.1

## 2011-01-11 LAB — COMPREHENSIVE METABOLIC PANEL
ALT: 38 — ABNORMAL HIGH
ALT: 38 — ABNORMAL HIGH
AST: 32
BUN: 8
Calcium: 9.2
Calcium: 9.4
Creatinine, Ser: 0.55
Creatinine, Ser: 0.59
GFR calc Af Amer: >60
Glucose, Bld: 186 — ABNORMAL HIGH
Sodium: 141
Sodium: 142
Total Protein: 6.6
Total Protein: 6.6

## 2011-01-11 LAB — APTT: aPTT: 46 — ABNORMAL HIGH

## 2011-01-11 LAB — DIFFERENTIAL
Eosinophils Absolute: 0
Eosinophils Relative: 0
Lymphocytes Relative: 27
Lymphocytes Relative: 33
Lymphs Abs: 1.7
Lymphs Abs: 2.1
Monocytes Relative: 11
Monocytes Relative: 13 — ABNORMAL HIGH
Neutro Abs: 3.8
Neutrophils Relative %: 55
Neutrophils Relative %: 59

## 2011-01-11 LAB — POCT CARDIAC MARKERS
CKMB, poc: <1 — ABNORMAL LOW
Myoglobin, poc: 36.7
Operator id: 277751
Troponin i, poc: <0.05

## 2011-01-11 LAB — LIPID PANEL
Triglycerides: 44
VLDL: 9

## 2011-01-11 LAB — CBC
Hemoglobin: 12.4
MCHC: 33.5
MCV: 99.7
Platelets: 196
RBC: 3.56 — ABNORMAL LOW
RDW: 14.3
WBC: 6.4

## 2011-01-11 LAB — HOMOCYSTEINE: Homocysteine: 8

## 2011-01-11 LAB — HEMOGLOBIN A1C
Hgb A1c MFr Bld: 7.5 — ABNORMAL HIGH
Mean Plasma Glucose: 190

## 2011-01-11 LAB — FOLATE: Folate: 18.6

## 2011-01-11 LAB — URINALYSIS, ROUTINE W REFLEX MICROSCOPIC
Bilirubin Urine: NEGATIVE
Ketones, ur: NEGATIVE
Nitrite: NEGATIVE
pH: 8

## 2011-01-11 LAB — PROTIME-INR
INR: 2 — ABNORMAL HIGH
INR: 2.8 — ABNORMAL HIGH
Prothrombin Time: 23.6 — ABNORMAL HIGH

## 2011-01-11 LAB — TROPONIN I: Troponin I: <0.01

## 2011-01-11 LAB — CK TOTAL AND CKMB (NOT AT ARMC)
Relative Index: INVALID
Total CK: 88

## 2011-01-11 LAB — VITAMIN B12: Vitamin B-12: 328 (ref 211–911)

## 2011-02-04 ENCOUNTER — Ambulatory Visit (INDEPENDENT_AMBULATORY_CARE_PROVIDER_SITE_OTHER): Payer: Medicare PPO | Admitting: *Deleted

## 2011-02-04 DIAGNOSIS — I4891 Unspecified atrial fibrillation: Secondary | ICD-10-CM

## 2011-02-04 LAB — POCT INR: INR: 2

## 2011-02-14 ENCOUNTER — Other Ambulatory Visit: Payer: Self-pay | Admitting: Cardiology

## 2011-02-26 ENCOUNTER — Encounter: Payer: Self-pay | Admitting: Vascular Surgery

## 2011-03-04 ENCOUNTER — Ambulatory Visit (INDEPENDENT_AMBULATORY_CARE_PROVIDER_SITE_OTHER): Payer: Medicare PPO | Admitting: *Deleted

## 2011-03-04 DIAGNOSIS — I4891 Unspecified atrial fibrillation: Secondary | ICD-10-CM

## 2011-03-04 LAB — POCT INR: INR: 2

## 2011-03-20 ENCOUNTER — Encounter: Payer: Self-pay | Admitting: Vascular Surgery

## 2011-03-21 ENCOUNTER — Encounter: Payer: Self-pay | Admitting: Vascular Surgery

## 2011-03-21 ENCOUNTER — Ambulatory Visit (INDEPENDENT_AMBULATORY_CARE_PROVIDER_SITE_OTHER): Payer: Medicare PPO | Admitting: Vascular Surgery

## 2011-03-21 ENCOUNTER — Other Ambulatory Visit (INDEPENDENT_AMBULATORY_CARE_PROVIDER_SITE_OTHER): Payer: Medicare PPO | Admitting: *Deleted

## 2011-03-21 ENCOUNTER — Ambulatory Visit (INDEPENDENT_AMBULATORY_CARE_PROVIDER_SITE_OTHER): Payer: Medicare PPO | Admitting: *Deleted

## 2011-03-21 VITALS — BP 139/81 | HR 90 | Resp 16 | Ht 59.0 in | Wt 95.0 lb

## 2011-03-21 DIAGNOSIS — I70219 Atherosclerosis of native arteries of extremities with intermittent claudication, unspecified extremity: Secondary | ICD-10-CM

## 2011-03-21 DIAGNOSIS — I7092 Chronic total occlusion of artery of the extremities: Secondary | ICD-10-CM

## 2011-03-21 DIAGNOSIS — Z48812 Encounter for surgical aftercare following surgery on the circulatory system: Secondary | ICD-10-CM

## 2011-03-21 NOTE — Progress Notes (Signed)
[facility] [place] HISTORY AND PHYSICAL   History of Present Illness:  Patient is a 77 y.o. year old female who presents for follow-up evaluation for PAD.  She is on Aspirin for antiplatelet therapy. Her atherosclerotic risk factors remain atrial fibrillation, diabetes, elevated cholesterol, hypertension.  These are all currently stable and followed by the primary care physician.  The patient currently has some stiffness of the left ankle and calf that have been present since her left above-knee to below-knee popliteal bypass. She does not really describe claudication symptoms. She has some symptoms which sound like early rest pain in her left foot but these did not really bother her. She is not requiring pain medicine for this.  Past Medical History  Diagnosis Date  . Diabetes mellitus   . Hypertension   . Hyperlipidemia   . Stroke   . Leg pain   . Peripheral arterial disease   . Atrial fibrillation     Past Surgical History  Procedure Date  . Cesarean section   . Lumbar disc surgery   . Femoral bypass   . Cataract extraction 2000  . Back surgery 1996    Review of Systems:  Neurologic: sensation in the feet is intact Cardiac:denies shortness of breath or chest pain Pulmonary: denies cough or wheeze  Social History History  Substance Use Topics  . Smoking status: Never Smoker   . Smokeless tobacco: Not on file  . Alcohol Use: No    Allergies  No Known Allergies   Current Outpatient Prescriptions  Medication Sig Dispense Refill  . amLODipine (NORVASC) 10 MG tablet TAKE 1 TABLET EVERY DAY  30 tablet  10  . aspirin 81 MG tablet Take 81 mg by mouth daily.        Marland Kitchen atorvastatin (LIPITOR) 40 MG tablet Take 40 mg by mouth daily.        . carvedilol (COREG) 6.25 MG tablet Take 6.25 mg by mouth 2 (two) times daily with a meal.        . fish oil-omega-3 fatty acids 1000 MG capsule Take 1 g by mouth daily.        Marland Kitchen glipiZIDE (GLUCOTROL) 10 MG tablet Take  10 mg by mouth 2 (two) times daily before a meal.        . iron polysaccharides (NIFEREX) 150 MG capsule Take 150 mg by mouth daily.        Marland Kitchen lisinopril (PRINIVIL,ZESTRIL) 10 MG tablet TAKE 1 TABLET DAILY  30 tablet  7  . metFORMIN (GLUCOPHAGE) 500 MG tablet Take 500 mg by mouth 2 (two) times daily with a meal.        . Multiple Vitamins-Minerals (CENTRUM SILVER PO) Take 1 tablet by mouth 1 dose over 24 hours.        . topiramate (TOPAMAX) 15 MG capsule Take 15 mg by mouth 2 (two) times daily.        Marland Kitchen warfarin (COUMADIN) 5 MG tablet TAKE AS DIRECTED  30 tablet  3    Physical Examination  Filed Vitals:   03/21/11 1504  BP: 139/81  Pulse: 90  Resp: 16  Height: 4\' 11"  (1.499 m)  Weight: 95 lb (43.092 kg)  SpO2: 90%    Body mass index is 19.19 kg/(m^2).  General:  Alert and oriented, no acute distress HEENT: Normal Neck: No bruit or JVD Pulmonary: Clear to auscultation bilaterally Cardiac: Regular Rate and Rhythm without murmur Neurologic: Upper and lower extremity motor 5/5 and symmetric Extremities:  2+ femoral pulses bilaterally with absent popliteal and pedal pulses left foot, trace edema left ankle and foot Skin: no ulcer or rash  DATA: She had bilateral toe pressures performed today since she has previously had calcified vessels. I reviewed and interpreted her study. Duplex ultrasound shows that most likely her bypass is occluded. Her toe brachial index is x-ray slightly improved from previously and is 0.36   ASSESSMENT: Occluded left above-knee to below-knee popliteal bypass. The patient has single vessel runoff via the posterior tibial artery previously. Revascularization would most likely require a prosthetic bypass to the posterior tibial artery with limited durability. Believe the best measure for the patient this point be close followup and if she develops worsening or critical ischemia we would consider a redo posterior tibial bypass. Currently she is more bothered by  stiffness in her leg which seems to be postoperative. We will schedule her her for evaluation by physical therapist. She will followup with me in 3 months time.   Fabienne Bruns, MD [facility] [place] Office: [phone] Pager: [phone]

## 2011-04-01 ENCOUNTER — Ambulatory Visit (INDEPENDENT_AMBULATORY_CARE_PROVIDER_SITE_OTHER): Payer: Medicare PPO | Admitting: *Deleted

## 2011-04-01 DIAGNOSIS — I4891 Unspecified atrial fibrillation: Secondary | ICD-10-CM

## 2011-04-01 LAB — POCT INR: INR: 2.3

## 2011-04-22 ENCOUNTER — Ambulatory Visit: Payer: Medicare PPO | Attending: Vascular Surgery | Admitting: Physical Therapy

## 2011-04-22 DIAGNOSIS — IMO0001 Reserved for inherently not codable concepts without codable children: Secondary | ICD-10-CM | POA: Insufficient documentation

## 2011-04-22 DIAGNOSIS — M25669 Stiffness of unspecified knee, not elsewhere classified: Secondary | ICD-10-CM | POA: Insufficient documentation

## 2011-04-29 ENCOUNTER — Ambulatory Visit (INDEPENDENT_AMBULATORY_CARE_PROVIDER_SITE_OTHER): Payer: Medicare PPO | Admitting: *Deleted

## 2011-04-29 DIAGNOSIS — I4891 Unspecified atrial fibrillation: Secondary | ICD-10-CM

## 2011-04-29 LAB — POCT INR: INR: 2.4

## 2011-05-01 ENCOUNTER — Ambulatory Visit: Payer: Medicare PPO | Admitting: Physical Therapy

## 2011-05-08 ENCOUNTER — Ambulatory Visit: Payer: Medicare PPO | Admitting: Physical Therapy

## 2011-05-09 NOTE — Procedures (Unsigned)
BYPASS GRAFT EVALUATION  INDICATION:  Left bypass graft.  HISTORY: Diabetes:  Yes. Cardiac:  No. Hypertension:  Yes. Smoking:  Previous. Previous Surgery:  Left above-knee to below-knee popliteal bypass graft on 07/23/2010.  SINGLE LEVEL ARTERIAL EXAM                              RIGHT              LEFT Brachial: Anterior tibial: Posterior tibial: Peroneal: Ankle/brachial index:  PREVIOUS ABI:  Date:  RIGHT:  LEFT:  LOWER EXTREMITY BYPASS GRAFT DUPLEX EXAM:  DUPLEX:  Left lower extremity bypass graft was not adequately visualized with collateral circulation noted in its region.  IMPRESSION: 1. The left above-knee to below-knee popliteal bypass graft was not     adequately visualized, which may suggest possible occlusion.  The     previous duplex of the bypass graft on 12/06/2010 demonstrated     decreased visualization of the bypass graft at that time. 2. Bilateral ankle brachial indices are noted on a separate report.  ___________________________________________ [name]. Fields, MD  CH/MEDQ  D:  03/26/2011  T:  03/26/2011  Job:  [id number]

## 2011-06-10 ENCOUNTER — Ambulatory Visit (INDEPENDENT_AMBULATORY_CARE_PROVIDER_SITE_OTHER): Payer: Medicare PPO | Admitting: Pharmacist

## 2011-06-10 DIAGNOSIS — I4891 Unspecified atrial fibrillation: Secondary | ICD-10-CM

## 2011-06-10 LAB — POCT INR: INR: 1.7

## 2011-06-17 ENCOUNTER — Other Ambulatory Visit: Payer: Self-pay | Admitting: Cardiology

## 2011-06-24 ENCOUNTER — Other Ambulatory Visit: Payer: Self-pay | Admitting: Cardiology

## 2011-07-03 ENCOUNTER — Encounter: Payer: Self-pay | Admitting: Vascular Surgery

## 2011-07-04 ENCOUNTER — Other Ambulatory Visit: Payer: Medicare PPO

## 2011-07-04 ENCOUNTER — Ambulatory Visit: Payer: Medicare PPO | Admitting: Vascular Surgery

## 2011-07-04 ENCOUNTER — Encounter: Payer: Self-pay | Admitting: Vascular Surgery

## 2011-07-04 ENCOUNTER — Ambulatory Visit (INDEPENDENT_AMBULATORY_CARE_PROVIDER_SITE_OTHER): Payer: Medicare PPO | Admitting: *Deleted

## 2011-07-04 ENCOUNTER — Ambulatory Visit (INDEPENDENT_AMBULATORY_CARE_PROVIDER_SITE_OTHER): Payer: Medicare PPO | Admitting: Vascular Surgery

## 2011-07-04 VITALS — BP 142/47 | HR 89 | Temp 97.8°F | Ht 59.0 in | Wt 95.0 lb

## 2011-07-04 DIAGNOSIS — I4891 Unspecified atrial fibrillation: Secondary | ICD-10-CM

## 2011-07-04 DIAGNOSIS — I7092 Chronic total occlusion of artery of the extremities: Secondary | ICD-10-CM | POA: Insufficient documentation

## 2011-07-04 DIAGNOSIS — T82858A Stenosis of vascular prosthetic devices, implants and grafts, initial encounter: Secondary | ICD-10-CM

## 2011-07-04 NOTE — Progress Notes (Signed)
[facility] [place]  HISTORY AND PHYSICAL   History of Present Illness: Patient is a 78 y.o. year old female who presents for follow-up evaluation for PAD. She is on Aspirin for antiplatelet therapy. Her atherosclerotic risk factors remain atrial fibrillation, diabetes, elevated cholesterol, hypertension. These are all currently stable and followed by the primary care physician. The patient currently has some stiffness of the left ankle and calf that have been present since her left above-knee to below-knee popliteal bypass. She does not really describe claudication symptoms. She has some symptoms which sound like early rest pain in her left foot but these did not really bother her. She is not requiring pain medicine for this.   Past Medical History   Diagnosis  Date   .  Diabetes mellitus    .  Hypertension    .  Hyperlipidemia    .  Stroke    .  Leg pain    .  Peripheral arterial disease    .  Atrial fibrillation     Past Surgical History   Procedure  Date   .  Cesarean section    .  Lumbar disc surgery    .  Femoral bypass    .  Cataract extraction  2000   .  Back surgery  1996    Review of Systems:  Neurologic: sensation in the feet is intact  Cardiac:denies shortness of breath or chest pain  Pulmonary: denies cough or wheeze  Social History  History   Substance Use Topics   .  Smoking status:  Never Smoker   .  Smokeless tobacco:  Not on file   .  Alcohol Use:  No    Allergies  No Known Allergies  Current Outpatient Prescriptions   Medication  Sig  Dispense  Refill   .  amLODipine (NORVASC) 10 MG tablet  TAKE 1 TABLET EVERY DAY  30 tablet  10   .  aspirin 81 MG tablet  Take 81 mg by mouth daily.     Marland Kitchen  atorvastatin (LIPITOR) 40 MG tablet  Take 40 mg by mouth daily.     .  carvedilol (COREG) 6.25 MG tablet  Take 6.25 mg by mouth 2 (two) times daily with a meal.     .  fish oil-omega-3 fatty acids 1000 MG capsule  Take 1 g by mouth daily.     Marland Kitchen   glipiZIDE (GLUCOTROL) 10 MG tablet  Take 10 mg by mouth 2 (two) times daily before a meal.     .  iron polysaccharides (NIFEREX) 150 MG capsule  Take 150 mg by mouth daily.     Marland Kitchen  lisinopril (PRINIVIL,ZESTRIL) 10 MG tablet  TAKE 1 TABLET DAILY  30 tablet  7   .  metFORMIN (GLUCOPHAGE) 500 MG tablet  Take 500 mg by mouth 2 (two) times daily with a meal.     .  Multiple Vitamins-Minerals (CENTRUM SILVER PO)  Take 1 tablet by mouth 1 dose over 24 hours.     .  topiramate (TOPAMAX) 15 MG capsule  Take 15 mg by mouth 2 (two) times daily.     Marland Kitchen  warfarin (COUMADIN) 5 MG tablet  TAKE AS DIRECTED  30 tablet  3    Physical Examination  Filed Vitals:   07/04/11 1427  BP: 142/47  Pulse: 89  Temp: 97.8 F (36.6 C)  TempSrc: Oral  Height: 4\' 11"  (1.499 m)  Weight: 95 lb (43.092 kg)  General: Alert and oriented, no acute distress  HEENT: Normal  Neck: No bruit or JVD  Pulmonary: Clear to auscultation bilaterally  Cardiac: Regular Rate and Rhythm without murmur  Neurologic: Upper and lower extremity motor 5/5 and symmetric  Extremities: 2+ femoral pulses bilaterally with absent popliteal and pedal pulses left foot, trace edema left ankle and foot  Skin: no ulcer or rash   DATA: She had bilateral toe pressures performed today since she has previously had calcified vessels. I reviewed and interpreted her study. Duplex ultrasound shows that most likely her bypass is occluded. Her toe brachial index is x-ray slightly improved from previously and is 0.36   ASSESSMENT: Occluded left above-knee to below-knee popliteal bypass. The patient has single vessel runoff via the posterior tibial artery previously. Revascularization would most likely require a prosthetic bypass to the posterior tibial artery with limited durability. Believe the best measure for the patient this point be close followup and if she develops worsening or critical ischemia we would consider a redo posterior tibial bypass. Currently she is  more bothered by stiffness in her leg which seems to be postoperative. She does have mild early rest pain. She will followup with me in 3 months time.  Fabienne Bruns, MD  [facility] [place]  Office: [phone]  Pager: [phone]

## 2011-07-23 ENCOUNTER — Other Ambulatory Visit: Payer: Self-pay | Admitting: Cardiology

## 2011-08-01 ENCOUNTER — Ambulatory Visit (INDEPENDENT_AMBULATORY_CARE_PROVIDER_SITE_OTHER): Payer: Medicare PPO | Admitting: Pharmacist

## 2011-08-01 DIAGNOSIS — I4891 Unspecified atrial fibrillation: Secondary | ICD-10-CM

## 2011-08-29 ENCOUNTER — Ambulatory Visit (INDEPENDENT_AMBULATORY_CARE_PROVIDER_SITE_OTHER): Payer: Medicare PPO | Admitting: *Deleted

## 2011-08-29 DIAGNOSIS — I4891 Unspecified atrial fibrillation: Secondary | ICD-10-CM

## 2011-09-02 ENCOUNTER — Other Ambulatory Visit: Payer: Self-pay | Admitting: Cardiology

## 2011-09-15 ENCOUNTER — Other Ambulatory Visit: Payer: Self-pay | Admitting: Cardiology

## 2011-10-02 ENCOUNTER — Encounter: Payer: Self-pay | Admitting: Vascular Surgery

## 2011-10-02 ENCOUNTER — Other Ambulatory Visit: Payer: Self-pay | Admitting: *Deleted

## 2011-10-02 DIAGNOSIS — I70219 Atherosclerosis of native arteries of extremities with intermittent claudication, unspecified extremity: Secondary | ICD-10-CM

## 2011-10-03 ENCOUNTER — Encounter: Payer: Self-pay | Admitting: Neurosurgery

## 2011-10-03 ENCOUNTER — Encounter (INDEPENDENT_AMBULATORY_CARE_PROVIDER_SITE_OTHER): Payer: Medicare PPO | Admitting: *Deleted

## 2011-10-03 ENCOUNTER — Ambulatory Visit (INDEPENDENT_AMBULATORY_CARE_PROVIDER_SITE_OTHER): Payer: Medicare PPO | Admitting: Neurosurgery

## 2011-10-03 VITALS — BP 148/72 | HR 80 | Resp 14 | Ht 59.5 in | Wt 97.2 lb

## 2011-10-03 DIAGNOSIS — Z48812 Encounter for surgical aftercare following surgery on the circulatory system: Secondary | ICD-10-CM

## 2011-10-03 DIAGNOSIS — I739 Peripheral vascular disease, unspecified: Secondary | ICD-10-CM

## 2011-10-03 DIAGNOSIS — I70219 Atherosclerosis of native arteries of extremities with intermittent claudication, unspecified extremity: Secondary | ICD-10-CM

## 2011-10-03 NOTE — Progress Notes (Addendum)
[facility] [place] PAD/PVD Office Note  CC: Annual lower extremity ABIs Referring Physician: Fields  History of Present Illness: 78 year old female patient of Dr. Darrick Penna status post a left above-the-knee to below the knee popliteal bypass graft in April 2012.  The patient reports no pain with claudication although her ABIs dropped. The patient does have complaints of an ingrown toenail on her left foot otherwise her foot is well perfused and there is no discoloration. The patient denies any rest pain true claudication or any open wounds.  Past Medical History  Diagnosis Date  . Diabetes mellitus   . Hypertension   . Hyperlipidemia   . Stroke   . Leg pain   . Peripheral arterial disease   . Atrial fibrillation     ROS: [x]  Positive   [ ]  Denies    General: [ ]  Weight loss, [ ]  Fever, [ ]  chills Neurologic: [x ] Dizziness, [ ]  Blackouts, [ ]  Seizure [ ]  Stroke, [ ]  "Mini stroke", [ ]  Slurred speech, [ ]  Temporary blindness; [ ]  weakness in arms or legs, [ ]  Hoarseness Cardiac: [ ]  Chest pain/pressure, [ ]  Shortness of breath at rest [ ]  Shortness of breath with exertion, [ ]  Atrial fibrillation or irregular heartbeat Vascular: [x ] Pain in legs with walking, [ ]  Pain in legs at rest, [ ]  Pain in legs at night,  [ ]  Non-healing ulcer, [ ]  Blood clot in vein/DVT,   Pulmonary: [ ]  Home oxygen, [ ]  Productive cough, [ ]  Coughing up blood, [ ]  Asthma,  [ ]  Wheezing Musculoskeletal:  [ ]  Arthritis, [ ]  Low back pain, [ ]  Joint pain Hematologic: [ ]  Easy Bruising, [ ]  Anemia; [ ]  Hepatitis Gastrointestinal: [ ]  Blood in stool, [ ]  Gastroesophageal Reflux/heartburn, [ ]  Trouble swallowing Urinary: [ ]  chronic Kidney disease, [ ]  on HD - [ ]  MWF or [ ]  TTHS, [ ]  Burning with urination, [ ]  Difficulty urinating Skin: [ ]  Rashes, [ ]  Wounds Psychological: [ ]  Anxiety, [ ]  Depression   Social History History  Substance Use Topics  . Smoking status: Never Smoker   .  Smokeless tobacco: Not on file  . Alcohol Use: No    Family History Family History  Problem Relation Age of Onset  . Hypertension Mother   . Diabetes Father   . Hypertension Father   . Stroke Father   . Stroke Sister   . Diabetes Brother   . Diabetes Brother   . Heart disease Brother     No Known Allergies  Current Outpatient Prescriptions  Medication Sig Dispense Refill  . amLODipine (NORVASC) 10 MG tablet TAKE 1 TABLET EVERY DAY  30 tablet  10  . aspirin 81 MG tablet Take 81 mg by mouth daily.        Marland Kitchen atorvastatin (LIPITOR) 40 MG tablet TAKE 1 TABLET BY MOUTH ONCE A DAY  30 tablet  7  . carvedilol (COREG) 6.25 MG tablet Take 6.25 mg by mouth 2 (two) times daily with a meal.        . fish oil-omega-3 fatty acids 1000 MG capsule Take 1 g by mouth daily.        Marland Kitchen glipiZIDE (GLUCOTROL) 10 MG tablet Take 10 mg by mouth 2 (two) times daily before a meal.        . lisinopril (PRINIVIL,ZESTRIL) 10 MG tablet TAKE 1 TABLET DAILY  30 tablet  0  . metFORMIN (GLUCOPHAGE) 500 MG  tablet Take 500 mg by mouth 2 (two) times daily with a meal.        . Multiple Vitamins-Minerals (CENTRUM SILVER PO) Take 1 tablet by mouth 1 dose over 24 hours.        . saxagliptin HCl (ONGLYZA) 5 MG TABS tablet Take 5 mg by mouth daily.      Marland Kitchen warfarin (COUMADIN) 5 MG tablet TAKE AS DIRECTED  30 tablet  3  . DISCONTD: iron polysaccharides (NIFEREX) 150 MG capsule Take 150 mg by mouth daily.        Marland Kitchen DISCONTD: topiramate (TOPAMAX) 15 MG capsule Take 15 mg by mouth 2 (two) times daily.          Physical Examination  Filed Vitals:   10/03/11 1446  BP: 148/72  Pulse: 80  Resp: 14    Body mass index is 19.30 kg/(m^2).  General:  WDWN in NAD Gait: Normal HEENT: WNL Eyes: Pupils equal Pulmonary: normal non-labored breathing , without Rales, rhonchi,  wheezing Cardiac: RRR, without  Murmurs, rubs or gallops; No carotid bruits Abdomen: soft, NT, no masses Skin: no rashes, ulcers noted Vascular  Exam/Pulses: Pedal pulses are heard with Doppler, radial pulses are 3+ bilaterally  Extremities without ischemic changes, no Gangrene , no cellulitis; no open wounds;  Musculoskeletal: no muscle wasting or atrophy  Neurologic: A&O X 3; Appropriate Affect ; SENSATION: normal; MOTOR FUNCTION:  moving all extremities equally. Speech is fluent/normal  Non-Invasive Vascular Imaging: ABIs today are 0.68 and biphasic to triphasic on the right 0.21 and monophasic on the left, this was discussed with Dr. Darrick Penna who feels like the patient can followup in one year since she is asymptomatic  ASSESSMENT/PLAN: Asymptomatic patient is 14 months status post left above the knee to below the knee popliteal bypass graft. Patient was encouraged to see her primary care regarding her ingrown toenail. The patient will followup here in one year for repeat ABIs and bypass graft evaluation. Her questions were encouraged and answered, she is in agreement with this plan.  Lauree Chandler ANP  Clinic M.D.: Fields

## 2011-10-10 ENCOUNTER — Ambulatory Visit (INDEPENDENT_AMBULATORY_CARE_PROVIDER_SITE_OTHER): Payer: Medicare PPO | Admitting: *Deleted

## 2011-10-10 DIAGNOSIS — I4891 Unspecified atrial fibrillation: Secondary | ICD-10-CM

## 2011-10-10 LAB — POCT INR: INR: 2.6

## 2011-11-01 ENCOUNTER — Other Ambulatory Visit: Payer: Self-pay | Admitting: Cardiology

## 2011-11-04 ENCOUNTER — Other Ambulatory Visit: Payer: Self-pay | Admitting: Cardiology

## 2011-11-09 ENCOUNTER — Other Ambulatory Visit: Payer: Self-pay | Admitting: Cardiology

## 2011-11-21 ENCOUNTER — Ambulatory Visit (INDEPENDENT_AMBULATORY_CARE_PROVIDER_SITE_OTHER): Payer: Medicare PPO | Admitting: Pharmacist

## 2011-11-21 DIAGNOSIS — I4891 Unspecified atrial fibrillation: Secondary | ICD-10-CM

## 2011-11-21 LAB — POCT INR: INR: 3.1

## 2011-12-03 ENCOUNTER — Other Ambulatory Visit: Payer: Self-pay | Admitting: Cardiology

## 2012-01-02 ENCOUNTER — Ambulatory Visit (INDEPENDENT_AMBULATORY_CARE_PROVIDER_SITE_OTHER): Payer: Medicare PPO | Admitting: *Deleted

## 2012-01-02 DIAGNOSIS — I4891 Unspecified atrial fibrillation: Secondary | ICD-10-CM

## 2012-01-02 LAB — POCT INR: INR: 2.9

## 2012-02-13 ENCOUNTER — Ambulatory Visit (INDEPENDENT_AMBULATORY_CARE_PROVIDER_SITE_OTHER): Payer: Medicare PPO | Admitting: Pharmacist

## 2012-02-13 DIAGNOSIS — I4891 Unspecified atrial fibrillation: Secondary | ICD-10-CM

## 2012-03-26 ENCOUNTER — Ambulatory Visit (INDEPENDENT_AMBULATORY_CARE_PROVIDER_SITE_OTHER): Payer: Medicare PPO | Admitting: *Deleted

## 2012-03-26 DIAGNOSIS — I4891 Unspecified atrial fibrillation: Secondary | ICD-10-CM

## 2012-05-07 ENCOUNTER — Ambulatory Visit (INDEPENDENT_AMBULATORY_CARE_PROVIDER_SITE_OTHER): Payer: Medicare PPO | Admitting: *Deleted

## 2012-05-07 DIAGNOSIS — I4891 Unspecified atrial fibrillation: Secondary | ICD-10-CM

## 2012-06-18 ENCOUNTER — Ambulatory Visit (INDEPENDENT_AMBULATORY_CARE_PROVIDER_SITE_OTHER): Payer: Medicare PPO

## 2012-06-18 DIAGNOSIS — I4891 Unspecified atrial fibrillation: Secondary | ICD-10-CM

## 2012-07-16 ENCOUNTER — Ambulatory Visit (INDEPENDENT_AMBULATORY_CARE_PROVIDER_SITE_OTHER): Payer: Medicare PPO

## 2012-07-16 DIAGNOSIS — I4891 Unspecified atrial fibrillation: Secondary | ICD-10-CM

## 2012-08-13 ENCOUNTER — Ambulatory Visit (INDEPENDENT_AMBULATORY_CARE_PROVIDER_SITE_OTHER): Payer: Medicare PPO

## 2012-08-13 DIAGNOSIS — I4891 Unspecified atrial fibrillation: Secondary | ICD-10-CM

## 2012-08-24 ENCOUNTER — Ambulatory Visit (INDEPENDENT_AMBULATORY_CARE_PROVIDER_SITE_OTHER): Payer: Medicare PPO | Admitting: Pharmacist

## 2012-08-24 DIAGNOSIS — I4891 Unspecified atrial fibrillation: Secondary | ICD-10-CM

## 2012-09-14 ENCOUNTER — Ambulatory Visit (INDEPENDENT_AMBULATORY_CARE_PROVIDER_SITE_OTHER): Payer: Medicare PPO | Admitting: Pharmacist

## 2012-09-14 DIAGNOSIS — I4891 Unspecified atrial fibrillation: Secondary | ICD-10-CM

## 2012-10-05 ENCOUNTER — Ambulatory Visit: Payer: Medicare PPO | Admitting: Neurosurgery

## 2012-10-12 ENCOUNTER — Ambulatory Visit (INDEPENDENT_AMBULATORY_CARE_PROVIDER_SITE_OTHER): Payer: Medicare PPO | Admitting: *Deleted

## 2012-10-12 DIAGNOSIS — I4891 Unspecified atrial fibrillation: Secondary | ICD-10-CM

## 2012-10-19 ENCOUNTER — Other Ambulatory Visit: Payer: Self-pay | Admitting: *Deleted

## 2012-10-19 DIAGNOSIS — Z48812 Encounter for surgical aftercare following surgery on the circulatory system: Secondary | ICD-10-CM

## 2012-10-19 DIAGNOSIS — I739 Peripheral vascular disease, unspecified: Secondary | ICD-10-CM

## 2012-11-02 ENCOUNTER — Ambulatory Visit (INDEPENDENT_AMBULATORY_CARE_PROVIDER_SITE_OTHER): Payer: Medicare PPO | Admitting: *Deleted

## 2012-11-02 DIAGNOSIS — I4891 Unspecified atrial fibrillation: Secondary | ICD-10-CM

## 2012-11-02 LAB — POCT INR: INR: 2.2

## 2012-11-05 ENCOUNTER — Ambulatory Visit: Payer: Medicare PPO | Admitting: Vascular Surgery

## 2012-11-30 ENCOUNTER — Ambulatory Visit (INDEPENDENT_AMBULATORY_CARE_PROVIDER_SITE_OTHER): Payer: Medicare PPO | Admitting: Pharmacist

## 2012-11-30 DIAGNOSIS — I4891 Unspecified atrial fibrillation: Secondary | ICD-10-CM

## 2012-11-30 LAB — POCT INR: INR: 3

## 2012-12-02 ENCOUNTER — Encounter: Payer: Self-pay | Admitting: Vascular Surgery

## 2012-12-03 ENCOUNTER — Ambulatory Visit (INDEPENDENT_AMBULATORY_CARE_PROVIDER_SITE_OTHER): Payer: Medicare PPO | Admitting: Vascular Surgery

## 2012-12-03 ENCOUNTER — Encounter (INDEPENDENT_AMBULATORY_CARE_PROVIDER_SITE_OTHER): Payer: Medicare PPO | Admitting: *Deleted

## 2012-12-03 ENCOUNTER — Encounter: Payer: Self-pay | Admitting: Vascular Surgery

## 2012-12-03 VITALS — BP 159/77 | HR 77 | Ht 59.5 in | Wt 96.7 lb

## 2012-12-03 DIAGNOSIS — Z48812 Encounter for surgical aftercare following surgery on the circulatory system: Secondary | ICD-10-CM

## 2012-12-03 DIAGNOSIS — I739 Peripheral vascular disease, unspecified: Secondary | ICD-10-CM

## 2012-12-03 NOTE — Progress Notes (Signed)
[facility] [place]   HISTORY AND PHYSICAL    History of Present Illness: Patient is a 79 y.o. female who presents for follow-up evaluation for PAD. She is on Aspirin for antiplatelet therapy. Her atherosclerotic risk factors remain atrial fibrillation, diabetes, elevated cholesterol, hypertension. These are all currently stable and followed by the primary care physician. The patient currently has some stiffness of the left ankle and calf that have been present since her left above-knee to below-knee popliteal bypass. She does not really describe claudication symptoms. She has some symptoms which sound like early rest pain in her left foot but these did not really bother her. She is not requiring pain medicine for this. Her bypass is occluded.    Past Medical History   Diagnosis   Date    .   Diabetes mellitus      .   Hypertension      .   Hyperlipidemia      .   Stroke      .   Leg pain      .   Peripheral arterial disease      .   Atrial fibrillation         Past Surgical History   Procedure   Date    .   Cesarean section      .   Lumbar disc surgery      .   Femoral bypass      .   Cataract extraction   2000    .   Back surgery   1996     Review of Systems:   Neurologic: sensation in the feet is intact   Cardiac:denies shortness of breath or chest pain  Pulmonary: denies cough or wheeze  Social History   History   Substance Use Topics   .   Smoking status:   Never Smoker    .   Smokeless tobacco:   Not on file    .   Alcohol Use:   No     Allergies  No Known Allergies   Current Outpatient Prescriptions    Medication  Sig   Dispense   Refill    .   amLODipine (NORVASC) 10 MG tablet   TAKE 1 TABLET EVERY DAY   30 tablet   10    .   aspirin 81 MG tablet   Take 81 mg by mouth daily.        Marland Kitchen   atorvastatin (LIPITOR) 40 MG tablet   Take 40 mg by mouth daily.        .   carvedilol (COREG) 6.25 MG tablet   Take 6.25 mg by mouth 2 (two) times daily with a  meal.        .   fish oil-omega-3 fatty acids 1000 MG capsule   Take 1 g by mouth daily.        Marland Kitchen   glipiZIDE (GLUCOTROL) 10 MG tablet   Take 10 mg by mouth 2 (two) times daily before a meal.        .   iron polysaccharides (NIFEREX) 150 MG capsule   Take 150 mg by mouth daily.        Marland Kitchen   lisinopril (PRINIVIL,ZESTRIL) 10 MG tablet   TAKE 1 TABLET DAILY   30 tablet   7    .   metFORMIN (GLUCOPHAGE) 500 MG tablet   Take 500 mg by mouth 2 (two) times daily  with a meal.        .   Multiple Vitamins-Minerals (CENTRUM SILVER PO)   Take 1 tablet by mouth 1 dose over 24 hours.        .   topiramate (TOPAMAX) 15 MG capsule   Take 15 mg by mouth 2 (two) times daily.        Marland Kitchen   warfarin (COUMADIN) 5 MG tablet   TAKE AS DIRECTED   30 tablet   3     Physical Examination   Filed Vitals:     07/04/11 1427   BP:  142/47   Pulse:  89   Temp:  97.8 F (36.6 C)   TempSrc:  Oral   Height:  4\' 11"  (1.499 m)   Weight:  95 lb (43.092 kg)   General: Alert and oriented, no acute distress   HEENT: Normal   Neck: No bruit or JVD   Pulmonary: Clear to auscultation bilaterally   Cardiac: Regular Rate and Rhythm without murmur   Neurologic: Upper and lower extremity motor 5/5 and symmetric   Extremities: 2+ femoral pulses bilaterally with absent popliteal and pedal pulses left foot, trace edema left ankle and foot, 2+ right DP pulse Skin: no ulcer or rash   DATA: She had bilateral toe pressures performed today since she has previously had calcified vessels. I reviewed and interpreted her study. Toe  pressure right was 59 right was 108   ASSESSMENT: Occluded left above-knee to below-knee popliteal bypass. The patient has single vessel runoff via the posterior tibial artery previously. Revascularization would most likely require a prosthetic bypass to the posterior tibial artery with limited durability. Believe the best measure for the patient this point be close followup and if she develops worsening or critical  ischemia we would consider a redo posterior tibial bypass. Currently she is more bothered by stiffness in her leg which seems to be postoperative. She does have mild early rest pain. She will followup with me in 6 months time.  She is planning to have some work done on her left foot for ingrown toenail. I told her to followup with me if the wound has not completely healed within 2 weeks after her procedure   Fabienne Bruns, MD   [facility] [place]   Office: [phone]   Pager: [phone]

## 2012-12-04 NOTE — Addendum Note (Signed)
Addended by: Adria Dill L on: 12/04/2012 09:43 AM   Modules accepted: Orders

## 2012-12-28 ENCOUNTER — Ambulatory Visit (INDEPENDENT_AMBULATORY_CARE_PROVIDER_SITE_OTHER): Payer: Medicare PPO

## 2012-12-28 DIAGNOSIS — I4891 Unspecified atrial fibrillation: Secondary | ICD-10-CM

## 2013-01-04 ENCOUNTER — Ambulatory Visit (INDEPENDENT_AMBULATORY_CARE_PROVIDER_SITE_OTHER): Payer: Medicare PPO | Admitting: *Deleted

## 2013-01-04 DIAGNOSIS — I4891 Unspecified atrial fibrillation: Secondary | ICD-10-CM

## 2013-01-04 LAB — POCT INR: INR: 2.7

## 2013-02-01 ENCOUNTER — Ambulatory Visit (INDEPENDENT_AMBULATORY_CARE_PROVIDER_SITE_OTHER): Payer: Medicare PPO | Admitting: *Deleted

## 2013-02-01 DIAGNOSIS — I4891 Unspecified atrial fibrillation: Secondary | ICD-10-CM

## 2013-02-01 LAB — POCT INR: INR: 2

## 2013-03-01 ENCOUNTER — Ambulatory Visit (INDEPENDENT_AMBULATORY_CARE_PROVIDER_SITE_OTHER): Payer: Medicare PPO | Admitting: Pharmacist

## 2013-03-01 DIAGNOSIS — I4891 Unspecified atrial fibrillation: Secondary | ICD-10-CM

## 2013-03-09 ENCOUNTER — Encounter: Payer: Self-pay | Admitting: Cardiology

## 2013-03-09 ENCOUNTER — Ambulatory Visit (INDEPENDENT_AMBULATORY_CARE_PROVIDER_SITE_OTHER): Payer: Commercial Managed Care - HMO | Admitting: Cardiology

## 2013-03-09 VITALS — BP 142/76 | HR 78 | Ht 59.5 in | Wt 100.0 lb

## 2013-03-09 DIAGNOSIS — I4891 Unspecified atrial fibrillation: Secondary | ICD-10-CM | POA: Diagnosis not present

## 2013-03-09 DIAGNOSIS — Z23 Encounter for immunization: Secondary | ICD-10-CM

## 2013-03-09 DIAGNOSIS — I10 Essential (primary) hypertension: Secondary | ICD-10-CM

## 2013-03-09 DIAGNOSIS — I739 Peripheral vascular disease, unspecified: Secondary | ICD-10-CM

## 2013-03-09 DIAGNOSIS — Z0000 Encounter for general adult medical examination without abnormal findings: Secondary | ICD-10-CM

## 2013-03-09 DIAGNOSIS — E785 Hyperlipidemia, unspecified: Secondary | ICD-10-CM | POA: Diagnosis not present

## 2013-03-09 NOTE — Assessment & Plan Note (Signed)
Discontinue aspirin given need for Coumadin. Continue statin. Followed by vascular surgery.

## 2013-03-09 NOTE — Progress Notes (Signed)
HPI: The patient is a pleasant female who has a history of paroxysmal atrial fibrillation and prior CVA. She had a Myoview performed on Aug 27, 2005, that showed an ejection fraction of 80% and normal perfusion. Last echocardiogram in July of 2009 showed normal LV function and mild left atrial enlargement. Patient followed by vascular surgery for peripheral vascular disease. I last saw her in December of 2011. Since then she denies any dyspnea on exertion, orthopnea, PND, pedal edema, palpitations, syncope or chest pain.    Current Outpatient Prescriptions  Medication Sig Dispense Refill  . amLODipine (NORVASC) 10 MG tablet TAKE 1 TABLET EVERY DAY  30 tablet  10  . aspirin 81 MG tablet Take 81 mg by mouth daily.        Marland Kitchen atorvastatin (LIPITOR) 40 MG tablet TAKE 1 TABLET BY MOUTH ONCE A DAY  30 tablet  7  . carvedilol (COREG) 6.25 MG tablet Take 6.25 mg by mouth 2 (two) times daily with a meal.        . fish oil-omega-3 fatty acids 1000 MG capsule Take 1 g by mouth daily.        Marland Kitchen glipiZIDE (GLUCOTROL) 10 MG tablet Take 10 mg by mouth 2 (two) times daily before a meal.        . Lancet Devices (TRUEDRAW LANCING DEVICE) MISC       . lisinopril (PRINIVIL,ZESTRIL) 10 MG tablet TAKE 1 TABLET DAILY  30 tablet  0  . metFORMIN (GLUCOPHAGE) 500 MG tablet Take 500 mg by mouth 2 (two) times daily with a meal.        . Multiple Vitamins-Minerals (CENTRUM SILVER PO) Take 1 tablet by mouth 1 dose over 24 hours.        . saxagliptin HCl (ONGLYZA) 5 MG TABS tablet Take 5 mg by mouth daily.      . TRUETEST TEST test strip       . warfarin (COUMADIN) 5 MG tablet TAKE AS DIRECTED  30 tablet  3  . [DISCONTINUED] iron polysaccharides (NIFEREX) 150 MG capsule Take 150 mg by mouth daily.        . [DISCONTINUED] topiramate (TOPAMAX) 15 MG capsule Take 15 mg by mouth 2 (two) times daily.         No current facility-administered medications for this visit.     Past Medical History  Diagnosis Date  .  Diabetes mellitus   . Hypertension   . Hyperlipidemia   . Stroke   . Leg pain   . Peripheral arterial disease   . Atrial fibrillation     Past Surgical History  Procedure Laterality Date  . Cesarean section    . Lumbar disc surgery    . Femoral bypass    . Cataract extraction  2000  . Back surgery  1996    History   Social History  . Marital Status: Widowed    Spouse Name: N/A    Number of Children: N/A  . Years of Education: N/A   Occupational History  . Not on file.   Social History Main Topics  . Smoking status: Never Smoker   . Smokeless tobacco: Not on file  . Alcohol Use: No  . Drug Use: No  . Sexual Activity: Not on file   Other Topics Concern  . Not on file   Social History Narrative  . No narrative on file    ROS: no fevers or chills, productive cough, hemoptysis, dysphasia, odynophagia, melena,  hematochezia, dysuria, hematuria, rash, seizure activity, orthopnea, PND, pedal edema, claudication. Remaining systems are negative.  Physical Exam: Well-developed well-nourished in no acute distress.  Skin is warm and dry.  HEENT is normal.  Neck is supple.  Chest is clear to auscultation with normal expansion.  Cardiovascular exam is regular rate and rhythm.  Abdominal exam nontender or distended. No masses palpated. Extremities show no edema. neuro grossly intact  ECG sinus rhythm at a rate of 78. No ST changes. Occasional PAC.

## 2013-03-09 NOTE — Assessment & Plan Note (Signed)
Patient remains in sinus rhythm. Continue Coumadin but discontinue aspirin. Continue beta blocker.

## 2013-03-09 NOTE — Patient Instructions (Signed)
Your physician wants you to follow-up in: ONE YEAR WITH DR CRENSHAW You will receive a reminder letter in the mail two months in advance. If you don't receive a letter, please call our office to schedule the follow-up appointment.   STOP ASPIRIN 

## 2013-03-09 NOTE — Assessment & Plan Note (Signed)
Continue statin. Lipids and liver monitored by primary care. 

## 2013-03-09 NOTE — Assessment & Plan Note (Signed)
Blood pressure controlled. Continue present medications. Potassium and renal function monitored by primary care. 

## 2013-03-09 NOTE — Addendum Note (Signed)
Addended by: Guillermina [place] A on: 03/09/2013 09:49 AM   Modules accepted: Orders

## 2013-03-29 ENCOUNTER — Ambulatory Visit (INDEPENDENT_AMBULATORY_CARE_PROVIDER_SITE_OTHER): Payer: Medicare PPO | Admitting: *Deleted

## 2013-03-29 DIAGNOSIS — I4891 Unspecified atrial fibrillation: Secondary | ICD-10-CM

## 2013-04-26 ENCOUNTER — Ambulatory Visit (INDEPENDENT_AMBULATORY_CARE_PROVIDER_SITE_OTHER): Payer: Medicare HMO | Admitting: *Deleted

## 2013-04-26 DIAGNOSIS — I4891 Unspecified atrial fibrillation: Secondary | ICD-10-CM

## 2013-04-26 LAB — POCT INR: INR: 2.2

## 2013-05-24 ENCOUNTER — Ambulatory Visit (INDEPENDENT_AMBULATORY_CARE_PROVIDER_SITE_OTHER): Payer: Medicare HMO | Admitting: Pharmacist

## 2013-05-24 DIAGNOSIS — I4891 Unspecified atrial fibrillation: Secondary | ICD-10-CM

## 2013-05-24 LAB — POCT INR: INR: 2.8

## 2013-06-09 ENCOUNTER — Encounter: Payer: Self-pay | Admitting: Vascular Surgery

## 2013-06-10 ENCOUNTER — Ambulatory Visit (HOSPITAL_COMMUNITY)
Admission: RE | Admit: 2013-06-10 | Discharge: 2013-06-10 | Disposition: A | Discharge status: Home / Self Care | Payer: Medicare HMO | Source: Ambulatory Visit | Attending: Vascular Surgery | Admitting: Vascular Surgery

## 2013-06-10 ENCOUNTER — Encounter: Payer: Self-pay | Admitting: Vascular Surgery

## 2013-06-10 ENCOUNTER — Ambulatory Visit (INDEPENDENT_AMBULATORY_CARE_PROVIDER_SITE_OTHER)
Admission: RE | Admit: 2013-06-10 | Discharge: 2013-06-10 | Disposition: A | Discharge status: Home / Self Care | Payer: Medicare HMO | Source: Ambulatory Visit | Attending: Vascular Surgery | Admitting: Vascular Surgery

## 2013-06-10 ENCOUNTER — Ambulatory Visit (INDEPENDENT_AMBULATORY_CARE_PROVIDER_SITE_OTHER): Payer: Commercial Managed Care - HMO | Admitting: Vascular Surgery

## 2013-06-10 VITALS — BP 146/68 | HR 90 | Ht 59.5 in | Wt 96.0 lb

## 2013-06-10 DIAGNOSIS — I739 Peripheral vascular disease, unspecified: Secondary | ICD-10-CM

## 2013-06-10 DIAGNOSIS — Z48812 Encounter for surgical aftercare following surgery on the circulatory system: Secondary | ICD-10-CM

## 2013-06-10 NOTE — Progress Notes (Signed)
[facility] [place]   HISTORY AND PHYSICAL    History of Present Illness: Patient is a 80 y.o. female who presents for follow-up evaluation for PAD. She is on Aspirin for antiplatelet therapy. Her atherosclerotic risk factors remain atrial fibrillation, diabetes, elevated cholesterol, hypertension. These are all currently stable and followed by the primary care physician. She exercises frequently on a daily basis. She walks around the house and has no symptoms of claudication. She does not really describe claudication symptoms. She has some symptoms which sound like early rest pain in her left foot but these did not really bother her. This occurs about 2 nights per week. He resolves spontaneously. She is not requiring pain medicine for this. Her bypass has been occluded since 2012. She remains on Coumadin for atrial fibrillation.    Past Medical History    Diagnosis    Date     .    Diabetes mellitus        .    Hypertension        .    Hyperlipidemia        .    Stroke        .    Leg pain        .    Peripheral arterial disease        .    Atrial fibrillation           Past Surgical History    Procedure    Date     .    Cesarean section        .    Lumbar disc surgery        .    Femoral bypass        .    Cataract extraction    2000     .    Back surgery    1996      Review of Systems:   Neurologic: sensation in the feet is intact   Cardiac:denies shortness of breath or chest pain  Pulmonary: denies cough or wheeze  Social History   History    Substance Use Topics    .    Smoking status:    Never Smoker     .    Smokeless tobacco:    Not on file     .    Alcohol Use:    No      Allergies  No Known Allergies   Physical Examination   Filed Vitals:   06/10/13 1616  BP: 146/68  Pulse: 90  Height: 4' 11.5" (1.511 m)  Weight: 96 lb (43.545 kg)  SpO2: 98%    General: Alert and oriented, no acute distress   HEENT: Normal   Neck: No bruit or JVD    Pulmonary: Clear to auscultation bilaterally   Cardiac: Regular Rate and Rhythm without murmur   Neurologic: Upper and lower extremity motor 5/5 and symmetric   Extremities: 2+ femoral pulses bilaterally with absent popliteal and pedal pulses left foot, trace edema left ankle and foot, 2+ right DP pulse Skin: no ulcer or rash   DATA: She had bilateral toe pressures performed today since she has previously had calcified vessels. I reviewed and interpreted her study. Toe  pressure right was 108 left was 48  ASSESSMENT: Occluded left above-knee to below-knee popliteal bypass. The patient has single vessel runoff via the posterior tibial artery previously. Revascularization would most likely require a prosthetic bypass to  the posterior tibial artery with limited durability. Believe the best measure for the patient this point be close followup and if she develops worsening or critical ischemia we would consider a redo posterior tibial bypass. She does have mild early rest pain. She will followup with me in 6 months time.    Fabienne Bruns, MD   [facility] [place]   Office: [phone]   Pager: [phone]

## 2013-06-11 NOTE — Addendum Note (Signed)
Addended by: Adria Dill L on: 06/11/2013 03:20 PM   Modules accepted: Orders

## 2013-07-05 ENCOUNTER — Ambulatory Visit (INDEPENDENT_AMBULATORY_CARE_PROVIDER_SITE_OTHER): Payer: Commercial Managed Care - HMO | Admitting: Pharmacist

## 2013-07-05 DIAGNOSIS — I4891 Unspecified atrial fibrillation: Secondary | ICD-10-CM

## 2013-07-05 LAB — POCT INR: INR: 2.5

## 2013-08-16 ENCOUNTER — Ambulatory Visit (INDEPENDENT_AMBULATORY_CARE_PROVIDER_SITE_OTHER): Payer: Commercial Managed Care - HMO | Admitting: *Deleted

## 2013-08-16 DIAGNOSIS — I4891 Unspecified atrial fibrillation: Secondary | ICD-10-CM

## 2013-08-16 LAB — POCT INR: INR: 3.1

## 2013-09-20 ENCOUNTER — Ambulatory Visit (INDEPENDENT_AMBULATORY_CARE_PROVIDER_SITE_OTHER): Payer: Commercial Managed Care - HMO

## 2013-09-20 DIAGNOSIS — I4891 Unspecified atrial fibrillation: Secondary | ICD-10-CM

## 2013-09-20 LAB — POCT INR: INR: 3

## 2013-11-01 ENCOUNTER — Ambulatory Visit (INDEPENDENT_AMBULATORY_CARE_PROVIDER_SITE_OTHER): Payer: Commercial Managed Care - HMO | Admitting: Pharmacist

## 2013-11-01 DIAGNOSIS — I4891 Unspecified atrial fibrillation: Secondary | ICD-10-CM

## 2013-11-01 LAB — POCT INR: INR: 3.3

## 2013-11-22 ENCOUNTER — Ambulatory Visit (INDEPENDENT_AMBULATORY_CARE_PROVIDER_SITE_OTHER): Payer: Commercial Managed Care - HMO | Admitting: *Deleted

## 2013-11-22 DIAGNOSIS — I4891 Unspecified atrial fibrillation: Secondary | ICD-10-CM

## 2013-11-22 LAB — POCT INR: INR: 2.5

## 2013-12-02 ENCOUNTER — Ambulatory Visit: Payer: Medicare HMO | Admitting: Family

## 2013-12-02 ENCOUNTER — Encounter (HOSPITAL_COMMUNITY): Payer: Medicare HMO

## 2013-12-08 ENCOUNTER — Encounter: Payer: Self-pay | Admitting: Family

## 2013-12-09 ENCOUNTER — Ambulatory Visit (HOSPITAL_COMMUNITY)
Admission: RE | Admit: 2013-12-09 | Discharge: 2013-12-09 | Disposition: A | Discharge status: Home / Self Care | Payer: Medicare HMO | Source: Ambulatory Visit | Attending: Family | Admitting: Family

## 2013-12-09 ENCOUNTER — Other Ambulatory Visit: Payer: Self-pay | Admitting: *Deleted

## 2013-12-09 ENCOUNTER — Encounter: Payer: Self-pay | Admitting: Family

## 2013-12-09 ENCOUNTER — Ambulatory Visit (INDEPENDENT_AMBULATORY_CARE_PROVIDER_SITE_OTHER): Payer: Commercial Managed Care - HMO | Admitting: Family

## 2013-12-09 VITALS — BP 149/79 | HR 78 | Resp 16 | Ht 59.5 in | Wt 96.5 lb

## 2013-12-09 DIAGNOSIS — I4891 Unspecified atrial fibrillation: Secondary | ICD-10-CM | POA: Insufficient documentation

## 2013-12-09 DIAGNOSIS — Z8673 Personal history of transient ischemic attack (TIA), and cerebral infarction without residual deficits: Secondary | ICD-10-CM | POA: Insufficient documentation

## 2013-12-09 DIAGNOSIS — I10 Essential (primary) hypertension: Secondary | ICD-10-CM | POA: Diagnosis not present

## 2013-12-09 DIAGNOSIS — I70309 Unspecified atherosclerosis of unspecified type of bypass graft(s) of the extremities, unspecified extremity: Secondary | ICD-10-CM | POA: Insufficient documentation

## 2013-12-09 DIAGNOSIS — E7800 Pure hypercholesterolemia, unspecified: Secondary | ICD-10-CM | POA: Diagnosis not present

## 2013-12-09 DIAGNOSIS — Z48812 Encounter for surgical aftercare following surgery on the circulatory system: Secondary | ICD-10-CM

## 2013-12-09 DIAGNOSIS — Z9889 Other specified postprocedural states: Secondary | ICD-10-CM | POA: Insufficient documentation

## 2013-12-09 DIAGNOSIS — I739 Peripheral vascular disease, unspecified: Secondary | ICD-10-CM | POA: Diagnosis present

## 2013-12-09 DIAGNOSIS — E119 Type 2 diabetes mellitus without complications: Secondary | ICD-10-CM | POA: Insufficient documentation

## 2013-12-09 NOTE — Patient Instructions (Signed)

## 2013-12-09 NOTE — Progress Notes (Signed)
[facility] [place] HISTORY AND PHYSICAL -PAD  History of Present Illness [name] is a 80 y.o. female patient of Dr. Darrick Penna who is s/p left above knee to below knee popliteal artery bypass graft on 07/23/10, and her bypass has been occluded since 2012. She presents for follow-up.  Her atherosclerotic risk factors remain atrial fibrillation, diabetes, elevated cholesterol, hypertension. These are all currently stable and followed by the primary care physician.   She denies claudication symptoms with walking, denies non healing wounds. She had a stroke in 1999 as manifested by expressive and possibly receptive aphasia, denies hemiparesis, denies monocular loss of vision, denies any stroke or TIA activity since then. She is right hand dominant.  She walks for an hour continuously in her house daily. She also does push ups and other exercises daily.  The patient denies New Medical or Surgical History.  Pt Diabetic: Yes, states her last A1C was 7.8, uncontrolled Pt smoker: non-smoker  Pt meds include: Statin :Yes Betablocker: Yes ASA: No Other anticoagulants/antiplatelets: coumadin for atrial fib  Past Medical History  Diagnosis Date  . Diabetes mellitus   . Hypertension   . Hyperlipidemia   . Stroke   . Leg pain   . Peripheral arterial disease   . Atrial fibrillation     Social History History  Substance Use Topics  . Smoking status: Never Smoker   . Smokeless tobacco: Never Used  . Alcohol Use: No    Family History Family History  Problem Relation Age of Onset  . Hypertension Mother   . Heart attack Mother   . Diabetes Father   . Hypertension Father   . Stroke Father   . Stroke Sister   . Diabetes Sister   . Heart disease Sister     Before age 60  . Diabetes Brother   . Diabetes Brother   . Heart disease Brother     Before age 53    Past Surgical History  Procedure Laterality Date  . Cesarean section    . Lumbar disc surgery     . Femoral bypass    . Cataract extraction  2000  . Back surgery  1996  . Spine surgery    . Eye surgery      No Known Allergies  Current Outpatient Prescriptions  Medication Sig Dispense Refill  . amLODipine (NORVASC) 10 MG tablet TAKE 1 TABLET EVERY DAY  30 tablet  10  . atorvastatin (LIPITOR) 40 MG tablet TAKE 1 TABLET BY MOUTH ONCE A DAY  30 tablet  7  . carvedilol (COREG) 6.25 MG tablet Take 6.25 mg by mouth 2 (two) times daily with a meal.        . fish oil-omega-3 fatty acids 1000 MG capsule Take 1 g by mouth daily.        Marland Kitchen glipiZIDE (GLUCOTROL) 10 MG tablet Take 10 mg by mouth 2 (two) times daily before a meal.        . Lancet Devices (TRUEDRAW LANCING DEVICE) MISC       . linagliptin (TRADJENTA) 5 MG TABS tablet Take 5 mg by mouth daily.      Marland Kitchen lisinopril (PRINIVIL,ZESTRIL) 10 MG tablet TAKE 1 TABLET DAILY  30 tablet  0  . metFORMIN (GLUCOPHAGE) 500 MG tablet Take 500 mg by mouth 2 (two) times daily with a meal.        . Multiple Vitamins-Minerals (CENTRUM SILVER PO) Take 1 tablet by mouth 1 dose over 24 hours.        Marland Kitchen  TRUETEST TEST test strip       . warfarin (COUMADIN) 5 MG tablet TAKE AS DIRECTED  30 tablet  3  . [DISCONTINUED] iron polysaccharides (NIFEREX) 150 MG capsule Take 150 mg by mouth daily.        . [DISCONTINUED] topiramate (TOPAMAX) 15 MG capsule Take 15 mg by mouth 2 (two) times daily.         No current facility-administered medications for this visit.    ROS: See HPI for pertinent positives and negatives.   Physical Examination  Filed Vitals:   12/09/13 1122  BP: 149/79  Pulse: 78  Resp: 16  Height: 4' 11.5" (1.511 m)  Weight: 96 lb 8 oz (43.772 kg)  SpO2: 100%   Body mass index is 19.17 kg/(m^2).  General: A&O x 3, WDWN, thin. Gait: normal Eyes: PERRLA. Pulmonary: CTAB, without wheezes , rales or rhonchi. Cardiac: irregular Rhythm.        Carotid Bruits Right Left   Negative Negative  Aorta is slightly palpable. Radial pulses: are  2+ palpable and =                           VASCULAR EXAM: Extremities without ischemic changes  without Gangrene; without open wounds.                                                                                                          LE Pulses Right Left       FEMORAL  3+ palpable  2+ palpable        POPLITEAL  not palpable   not palpable       POSTERIOR TIBIAL  biphasic by Doppler    monophasic by Doppler        DORSALIS PEDIS      ANTERIOR TIBIAL 2+ palpable  Monophasic by Doppler     Abdomen: soft, NT, no masses. Skin: no rashes, no ulcers noted. Musculoskeletal: no muscle wasting or atrophy.  Neurologic: A&O X 3; Appropriate Affect ; SENSATION: normal; MOTOR FUNCTION:  moving all extremities equally, motor strength 4/5 throughout. Speech is fluent/normal. CN 2-12 intact.    Non-Invasive Vascular Imaging: DATE: 12/09/2013 ABI: RIGHT TBI: 0.86, Waveforms: bi and triphasic;  LEFT TBI: 0.37, Waveforms: monophasic   ASSESSMENT: [name] is a 80 y.o. female who presents s/p left above knee to below knee popliteal artery bypass graft on 07/23/10, and her bypass has been occluded since 2012.  Dr. Darrick Penna' assessment from his 06/10/13 progress note:  Occluded left above-knee to below-knee popliteal bypass. The patient has single vessel runoff via the posterior tibial artery previously. Revascularization would most likely require a prosthetic bypass to the posterior tibial artery with limited durability. Believe the best measure for the patient this point be close followup and if she develops worsening or critical ischemia we would consider a redo posterior tibial bypass.  Toe pressures are obtained today since her leg arteries are calcified.  Right TBI is 0.86 which is normal, left TBI is 0.37 which  is abnormal. She has no tissue loss. She walks and exercises over an hour daily.  PLAN:  Work closely with her PCP to get her DM under optimal control.  I discussed in  depth with the patient the nature of atherosclerosis, and emphasized the importance of maximal medical management including strict control of blood pressure, blood glucose, and lipid levels, obtaining regular exercise, and continued cessation of smoking.  The patient is aware that without maximal medical management the underlying atherosclerotic disease process will progress, limiting the benefit of any interventions.  Based on the patient's vascular studies and examination, pt will return to clinic in 6 months with ABI's.  The patient was given information about PAD including signs, symptoms, treatment, what symptoms should prompt the patient to seek immediate medical care, and risk reduction measures to take.  Charisse March, RN, MSN, FNP-C [facility] Phone: [phone]  Clinic MD: [facility]  12/09/2013 11:42 AM

## 2013-12-21 ENCOUNTER — Ambulatory Visit (INDEPENDENT_AMBULATORY_CARE_PROVIDER_SITE_OTHER): Payer: Commercial Managed Care - HMO | Admitting: *Deleted

## 2013-12-21 DIAGNOSIS — I4891 Unspecified atrial fibrillation: Secondary | ICD-10-CM

## 2013-12-21 LAB — POCT INR: INR: 3

## 2014-01-18 ENCOUNTER — Ambulatory Visit (INDEPENDENT_AMBULATORY_CARE_PROVIDER_SITE_OTHER): Payer: Commercial Managed Care - HMO | Admitting: *Deleted

## 2014-01-18 DIAGNOSIS — Z23 Encounter for immunization: Secondary | ICD-10-CM

## 2014-01-18 DIAGNOSIS — I4891 Unspecified atrial fibrillation: Secondary | ICD-10-CM

## 2014-01-18 LAB — POCT INR: INR: 2.1

## 2014-02-28 NOTE — Progress Notes (Signed)
HPI: FU paroxysmal atrial fibrillation and prior CVA. She had a Myoview performed on Aug 27, 2005, that showed an ejection fraction of 80% and normal perfusion. Last echocardiogram in July of 2009 showed normal LV function and mild left atrial enlargement. Patient followed by vascular surgery for peripheral vascular disease. Since I last saw her, the patient denies any dyspnea on exertion, orthopnea, PND, pedal edema, palpitations, syncope or chest pain.   Current Outpatient Prescriptions  Medication Sig Dispense Refill  . amLODipine (NORVASC) 10 MG tablet TAKE 1 TABLET EVERY DAY 30 tablet 10  . atorvastatin (LIPITOR) 40 MG tablet TAKE 1 TABLET BY MOUTH ONCE A DAY 30 tablet 7  . carvedilol (COREG) 6.25 MG tablet Take 6.25 mg by mouth 2 (two) times daily with a meal.      . fish oil-omega-3 fatty acids 1000 MG capsule Take 1 g by mouth daily.      Marland Kitchen. glipiZIDE (GLUCOTROL) 10 MG tablet Take 10 mg by mouth 2 (two) times daily before a meal.      . Lancet Devices (TRUEDRAW LANCING DEVICE) MISC     . linagliptin (TRADJENTA) 5 MG TABS tablet Take 5 mg by mouth daily.    Marland Kitchen. lisinopril (PRINIVIL,ZESTRIL) 10 MG tablet TAKE 1 TABLET DAILY 30 tablet 0  . metFORMIN (GLUCOPHAGE) 500 MG tablet Take 500 mg by mouth 2 (two) times daily with a meal.      . Multiple Vitamins-Minerals (CENTRUM SILVER PO) Take 1 tablet by mouth 1 dose over 24 hours.      . TRUETEST TEST test strip     . warfarin (COUMADIN) 5 MG tablet TAKE AS DIRECTED 30 tablet 3  . [DISCONTINUED] iron polysaccharides (NIFEREX) 150 MG capsule Take 150 mg by mouth daily.      . [DISCONTINUED] topiramate (TOPAMAX) 15 MG capsule Take 15 mg by mouth 2 (two) times daily.       No current facility-administered medications for this visit.     Past Medical History  Diagnosis Date  . Diabetes mellitus   . Hypertension   . Hyperlipidemia   . Stroke   . Leg pain   . Peripheral arterial disease   . Atrial fibrillation     Past Surgical  History  Procedure Laterality Date  . Cesarean section    . Lumbar disc surgery    . Femoral bypass    . Cataract extraction  2000  . Back surgery  1996  . Spine surgery    . Eye surgery      History   Social History  . Marital Status: Widowed    Spouse Name: N/A    Number of Children: N/A  . Years of Education: N/A   Occupational History  . Not on file.   Social History Main Topics  . Smoking status: Never Smoker   . Smokeless tobacco: Never Used  . Alcohol Use: No  . Drug Use: No  . Sexual Activity: Not on file   Other Topics Concern  . Not on file   Social History Narrative    ROS: no fevers or chills, productive cough, hemoptysis, dysphasia, odynophagia, melena, hematochezia, dysuria, hematuria, rash, seizure activity, orthopnea, PND, pedal edema, claudication. Remaining systems are negative.  Physical Exam: Well-developed well-nourished in no acute distress.  Skin is warm and dry.  HEENT is normal.  Neck is supple.  Chest is clear to auscultation with normal expansion.  Cardiovascular exam is regular rate and rhythm.  Abdominal  exam nontender or distended. No masses palpated. Extremities show no edema. neuro grossly intact  ECG Sinus rhythm with no ST changes.

## 2014-03-04 ENCOUNTER — Ambulatory Visit (INDEPENDENT_AMBULATORY_CARE_PROVIDER_SITE_OTHER): Payer: Commercial Managed Care - HMO | Admitting: Cardiology

## 2014-03-04 ENCOUNTER — Encounter: Payer: Self-pay | Admitting: Cardiology

## 2014-03-04 ENCOUNTER — Ambulatory Visit (INDEPENDENT_AMBULATORY_CARE_PROVIDER_SITE_OTHER): Payer: Commercial Managed Care - HMO | Admitting: Pharmacist Clinician (PhC)/ Clinical Pharmacy Specialist

## 2014-03-04 ENCOUNTER — Telehealth: Payer: Self-pay | Admitting: Cardiology

## 2014-03-04 VITALS — BP 140/70 | HR 89 | Ht 59.0 in | Wt 97.0 lb

## 2014-03-04 DIAGNOSIS — I4891 Unspecified atrial fibrillation: Secondary | ICD-10-CM

## 2014-03-04 DIAGNOSIS — I739 Peripheral vascular disease, unspecified: Secondary | ICD-10-CM

## 2014-03-04 LAB — POCT INR: INR: 2

## 2014-03-04 NOTE — Assessment & Plan Note (Signed)
Continue statin. 

## 2014-03-04 NOTE — Telephone Encounter (Signed)
Pt says she was suppose to let you know if her insurance would pay for her Xarelto. It will pay for the Xarelto,If she wait unttil January. Soo please send it in January.

## 2014-03-04 NOTE — Telephone Encounter (Signed)
Spoke with pt, she will stay on the warfarin until her f/u CVRR appt. And then they will help her make the switch. Patient voiced understanding

## 2014-03-04 NOTE — Assessment & Plan Note (Signed)
Followed by vascular surgery. 

## 2014-03-04 NOTE — Assessment & Plan Note (Signed)
Blood pressure controlled. Continue present medications. 

## 2014-03-04 NOTE — Patient Instructions (Signed)
Your physician wants you to follow-up in: ONE YEAR WITH DR Shelda Pal will receive a reminder letter in the mail two months in advance. If you don't receive a letter, please call our office to schedule the follow-up appointment.   XARELTO OR PRADAXA OR ELIQUIS TO REPLACE WARFARIN

## 2014-03-04 NOTE — Assessment & Plan Note (Signed)
Patient remains in sinus rhythm. Continue beta blocker and Coumadin. I have provided the names of NOACs and she will check with her insurance company to see if they're covered. If so we will transition.

## 2014-03-29 ENCOUNTER — Telehealth: Payer: Self-pay | Admitting: Cardiology

## 2014-03-29 NOTE — Telephone Encounter (Signed)
INR needs to be less than 3.0, so no need to adjust Coumadin dosing prior to follow-up INR check appt on 04/18/14.  If INR less than 3.0 pt is ok to start Xarelto at that time.  Attempted to call and notify pt of above, LMOM TCB.

## 2014-03-29 NOTE — Telephone Encounter (Signed)
Spoke with pt, aware she needs to keep the CVRR appt she has on 04-18-14. Will forward to the anticoagulation clinic for them to direct the patient on any changes in warfarin prior to starting xarelto. Pt agreed with this plan.

## 2014-03-29 NOTE — Telephone Encounter (Signed)
[name] is calling because Dr. Jens Som wants to start her on Xarelto and stop Coumadin . Do she need to go to her coumadin appt and are they any special  Directions for starting from Coumadin to the Xarelto .Marland Kitchen Please call    Thanks

## 2014-03-30 ENCOUNTER — Telehealth: Payer: Self-pay | Admitting: *Deleted

## 2014-04-05 NOTE — Telephone Encounter (Signed)
Patient aware of change to xarelto.

## 2014-04-18 ENCOUNTER — Ambulatory Visit (INDEPENDENT_AMBULATORY_CARE_PROVIDER_SITE_OTHER): Payer: Commercial Managed Care - HMO | Admitting: Pharmacist

## 2014-04-18 DIAGNOSIS — I4891 Unspecified atrial fibrillation: Secondary | ICD-10-CM

## 2014-04-18 LAB — POCT INR: INR: 2.7

## 2014-04-19 DIAGNOSIS — H40013 Open angle with borderline findings, low risk, bilateral: Secondary | ICD-10-CM | POA: Diagnosis not present

## 2014-04-19 DIAGNOSIS — H35363 Drusen (degenerative) of macula, bilateral: Secondary | ICD-10-CM | POA: Diagnosis not present

## 2014-04-19 DIAGNOSIS — E119 Type 2 diabetes mellitus without complications: Secondary | ICD-10-CM | POA: Diagnosis not present

## 2014-04-19 DIAGNOSIS — H524 Presbyopia: Secondary | ICD-10-CM | POA: Diagnosis not present

## 2014-05-05 DIAGNOSIS — L603 Nail dystrophy: Secondary | ICD-10-CM | POA: Diagnosis not present

## 2014-05-05 DIAGNOSIS — I739 Peripheral vascular disease, unspecified: Secondary | ICD-10-CM | POA: Diagnosis not present

## 2014-05-05 DIAGNOSIS — E1151 Type 2 diabetes mellitus with diabetic peripheral angiopathy without gangrene: Secondary | ICD-10-CM | POA: Diagnosis not present

## 2014-05-30 ENCOUNTER — Ambulatory Visit (INDEPENDENT_AMBULATORY_CARE_PROVIDER_SITE_OTHER): Payer: Commercial Managed Care - HMO

## 2014-05-30 DIAGNOSIS — R6889 Other general symptoms and signs: Secondary | ICD-10-CM | POA: Diagnosis not present

## 2014-05-30 DIAGNOSIS — I4891 Unspecified atrial fibrillation: Secondary | ICD-10-CM | POA: Diagnosis not present

## 2014-05-30 LAB — POCT INR: INR: 2.9

## 2014-06-08 ENCOUNTER — Encounter: Payer: Self-pay | Admitting: Family

## 2014-06-09 ENCOUNTER — Encounter (HOSPITAL_COMMUNITY): Payer: Commercial Managed Care - HMO

## 2014-06-09 ENCOUNTER — Ambulatory Visit: Payer: Commercial Managed Care - HMO | Admitting: Family

## 2014-06-09 ENCOUNTER — Encounter: Payer: Self-pay | Admitting: Family

## 2014-06-10 ENCOUNTER — Encounter: Payer: Self-pay | Admitting: Family

## 2014-06-10 ENCOUNTER — Ambulatory Visit (INDEPENDENT_AMBULATORY_CARE_PROVIDER_SITE_OTHER): Payer: Commercial Managed Care - HMO | Admitting: Family

## 2014-06-10 ENCOUNTER — Ambulatory Visit (HOSPITAL_COMMUNITY)
Admission: RE | Admit: 2014-06-10 | Discharge: 2014-06-10 | Disposition: A | Discharge status: Home / Self Care | Payer: Commercial Managed Care - HMO | Source: Ambulatory Visit | Attending: Family | Admitting: Family

## 2014-06-10 VITALS — BP 141/80 | HR 101 | Resp 16 | Ht 60.05 in | Wt 99.0 lb

## 2014-06-10 DIAGNOSIS — E1159 Type 2 diabetes mellitus with other circulatory complications: Secondary | ICD-10-CM

## 2014-06-10 DIAGNOSIS — M25676 Stiffness of unspecified foot, not elsewhere classified: Secondary | ICD-10-CM | POA: Insufficient documentation

## 2014-06-10 DIAGNOSIS — Z9889 Other specified postprocedural states: Secondary | ICD-10-CM | POA: Insufficient documentation

## 2014-06-10 DIAGNOSIS — I739 Peripheral vascular disease, unspecified: Secondary | ICD-10-CM | POA: Diagnosis not present

## 2014-06-10 DIAGNOSIS — E1151 Type 2 diabetes mellitus with diabetic peripheral angiopathy without gangrene: Secondary | ICD-10-CM

## 2014-06-10 NOTE — Patient Instructions (Signed)

## 2014-06-10 NOTE — Progress Notes (Signed)
[facility] [place] HISTORY AND PHYSICAL -PAD  History of Present Illness [name] is a 81 y.o. female patient of Dr. Darrick Penna who is s/p left above knee to below knee popliteal artery bypass graft on 07/23/10, and her bypass has been occluded since 2012. She presents for follow-up. Her atherosclerotic risk factors remain atrial fibrillation, diabetes, elevated cholesterol, hypertension. These are all currently stable and followed by the primary care physician.   She denies claudication symptoms with walking, denies non healing wounds. She had a stroke in 1999 as manifested by expressive and possibly receptive aphasia, denies hemiparesis, denies monocular loss of vision, denies any stroke or TIA activity since then; noted history of atrial fib. She is right hand dominant.  She walks for an hour continuously in her house daily. She also does push ups, stationary bike, and other exercises daily.  The patient denies New Medical or Surgical History.  Pt Diabetic: Yes, last A1C was 8.0 (November 2015, review of records), uncontrolled Pt smoker: non-smoker  Pt meds include: Statin :Yes Betablocker: Yes ASA: No Other anticoagulants/antiplatelets: coumadin for atrial fib     Past Medical History  Diagnosis Date  . Diabetes mellitus   . Hypertension   . Hyperlipidemia   . Leg pain   . Peripheral arterial disease   . Atrial fibrillation   . Stroke 1999    Social History History  Substance Use Topics  . Smoking status: Never Smoker   . Smokeless tobacco: Never Used  . Alcohol Use: No    Family History Family History  Problem Relation Age of Onset  . Hypertension Mother   . Heart attack Mother   . Diabetes Father   . Hypertension Father   . Stroke Father   . Stroke Sister   . Diabetes Sister   . Heart disease Sister     Before age 65-Aneurysm  . Dementia Sister   . Alzheimer's disease Sister   . Diabetes Brother   . Diabetes Brother   .  Heart disease Brother     Before age 85  . Heart attack Brother   . Heart attack Sister   . Diabetes Sister   . Alzheimer's disease Sister     Past Surgical History  Procedure Laterality Date  . Cesarean section    . Lumbar disc surgery    . Femoral bypass Left July 23, 2010  . Cataract extraction  2000  . Back surgery  1996  . Spine surgery    . Eye surgery      No Known Allergies  Current Outpatient Prescriptions  Medication Sig Dispense Refill  . amLODipine (NORVASC) 10 MG tablet TAKE 1 TABLET EVERY DAY 30 tablet 10  . atorvastatin (LIPITOR) 40 MG tablet TAKE 1 TABLET BY MOUTH ONCE A DAY 30 tablet 7  . carvedilol (COREG) 6.25 MG tablet Take 6.25 mg by mouth 2 (two) times daily with a meal.      . fish oil-omega-3 fatty acids 1000 MG capsule Take 1 g by mouth daily.      Marland Kitchen glipiZIDE (GLUCOTROL) 10 MG tablet Take 10 mg by mouth 2 (two) times daily before a meal.      . Lancet Devices (TRUEDRAW LANCING DEVICE) MISC     . linagliptin (TRADJENTA) 5 MG TABS tablet Take 5 mg by mouth daily.    Marland Kitchen lisinopril (PRINIVIL,ZESTRIL) 10 MG tablet TAKE 1 TABLET DAILY 30 tablet 0  . metFORMIN (GLUCOPHAGE) 500 MG tablet Take 500 mg  by mouth 2 (two) times daily with a meal.      . Multiple Vitamins-Minerals (CENTRUM SILVER PO) Take 1 tablet by mouth 1 dose over 24 hours.      . TRUETEST TEST test strip     . warfarin (COUMADIN) 5 MG tablet Take 2.5-5 mg by mouth. As directed    . [DISCONTINUED] iron polysaccharides (NIFEREX) 150 MG capsule Take 150 mg by mouth daily.      . [DISCONTINUED] topiramate (TOPAMAX) 15 MG capsule Take 15 mg by mouth 2 (two) times daily.       No current facility-administered medications for this visit.    ROS: See HPI for pertinent positives and negatives.   Physical Examination  Filed Vitals:   06/10/14 1557  BP: 141/80  Pulse: 101  Resp: 16  Height: 5' 0.05" (1.525 m)  Weight: 99 lb (44.906 kg)  SpO2: 99%   Body mass index is 19.31  kg/(m^2).  General: A&O x 3, WDWN, thin. Gait: normal Eyes: PERRLA. Pulmonary: CTAB, without wheezes , rales or rhonchi. Cardiac: irregular Rhythm.     Carotid Bruits Right Left   Negative Negative  Aorta is slightly palpable. Radial pulses: are 2+ palpable and =   VASCULAR EXAM: Extremities without ischemic changes  without Gangrene; without open wounds.      LE Pulses Right Left   FEMORAL 2+ palpable 2+ palpable    POPLITEAL not palpable  not palpable   POSTERIOR TIBIAL biphasic by Doppler   monophasic by Doppler    DORSALIS PEDIS  ANTERIOR TIBIAL 2+ palpable  Monophasic by Doppler     Abdomen: soft, NT, no palpable masses. Skin: no rashes, no ulcers. Musculoskeletal: no muscle wasting or atrophy. Neurologic: A&O X 3; Appropriate Affect, MOTOR FUNCTION: moving all extremities equally, motor strength 4/5 throughout. Speech is fluent/normal. CN 2-12 intact.           Non-Invasive Vascular Imaging: DATE: 06/10/2014 ABI: RIGHT TBI: 0.92  (12/09/13, 0.86), Waveforms: bi and triphasic; LEFT TBI: 0.43 (0.37), Waveforms: monophasic   ASSESSMENT: [name] is a 81 y.o. female who presents s/p left above knee to below knee popliteal artery bypass graft on 07/23/10, and her bypass has been occluded since 2012.  Dr. Darrick Penna' assessment from his 06/10/13 progress note:  Occluded left above-knee to below-knee popliteal bypass. The patient has single vessel runoff via the posterior tibial artery previously. Revascularization would most likely require a prosthetic bypass to the posterior tibial artery with limited durability. Believe the best measure for the patient this point be close followup and if she develops worsening or critical ischemia we  would consider a redo posterior tibial bypass.  Toe pressures are obtained today since her leg arteries are calcified.   Slight improvement in both TBI's in 6 months, likely secondary to the extensive exercising that she does. Her primary atherosclerotic risk factor at this point seems to be her uncontrolled DM, see Plan.  Face to face time with patient was 25 minutes. Over 50% of this time was spent on counseling and coordination of care.    PLAN:  I encouraged pt to work closely with her medical provider that helps her manage her DM to optimize her DM control.  I discussed in depth with the patient the nature of atherosclerosis, and emphasized the importance of maximal medical management including strict control of blood pressure, blood glucose, and lipid levels, obtaining regular exercise, and continued cessation of smoking.  The patient is aware that without maximal  medical management the underlying atherosclerotic disease process will progress, limiting the benefit of any interventions.  Based on the patient's vascular studies and examination, pt will return to clinic in 6 months with ABI's.   The patient was given information about PAD including signs, symptoms, treatment, what symptoms should prompt the patient to seek immediate medical care, and risk reduction measures to take.  Charisse March, RN, MSN, FNP-C [facility] Phone: [phone]  Clinic MD: Imogene Burn  06/10/2014 4:02 PM

## 2014-07-01 DIAGNOSIS — I10 Essential (primary) hypertension: Secondary | ICD-10-CM | POA: Diagnosis not present

## 2014-07-01 DIAGNOSIS — I739 Peripheral vascular disease, unspecified: Secondary | ICD-10-CM | POA: Diagnosis not present

## 2014-07-01 DIAGNOSIS — E0800 Diabetes mellitus due to underlying condition with hyperosmolarity without nonketotic hyperglycemic-hyperosmolar coma (NKHHC): Secondary | ICD-10-CM | POA: Diagnosis not present

## 2014-07-01 DIAGNOSIS — E782 Mixed hyperlipidemia: Secondary | ICD-10-CM | POA: Diagnosis not present

## 2014-07-01 DIAGNOSIS — R6889 Other general symptoms and signs: Secondary | ICD-10-CM | POA: Diagnosis not present

## 2014-07-11 ENCOUNTER — Ambulatory Visit (INDEPENDENT_AMBULATORY_CARE_PROVIDER_SITE_OTHER): Payer: Commercial Managed Care - HMO

## 2014-07-11 DIAGNOSIS — I4891 Unspecified atrial fibrillation: Secondary | ICD-10-CM | POA: Diagnosis not present

## 2014-07-11 DIAGNOSIS — R6889 Other general symptoms and signs: Secondary | ICD-10-CM | POA: Diagnosis not present

## 2014-07-11 LAB — POCT INR: INR: 2.7

## 2014-07-19 DIAGNOSIS — R6889 Other general symptoms and signs: Secondary | ICD-10-CM | POA: Diagnosis not present

## 2014-07-19 DIAGNOSIS — H40013 Open angle with borderline findings, low risk, bilateral: Secondary | ICD-10-CM | POA: Diagnosis not present

## 2014-07-28 DIAGNOSIS — R6889 Other general symptoms and signs: Secondary | ICD-10-CM | POA: Diagnosis not present

## 2014-08-18 DIAGNOSIS — R6889 Other general symptoms and signs: Secondary | ICD-10-CM | POA: Diagnosis not present

## 2014-08-18 DIAGNOSIS — Z0000 Encounter for general adult medical examination without abnormal findings: Secondary | ICD-10-CM | POA: Diagnosis not present

## 2014-08-22 ENCOUNTER — Ambulatory Visit (INDEPENDENT_AMBULATORY_CARE_PROVIDER_SITE_OTHER): Payer: Commercial Managed Care - HMO | Admitting: Pharmacist

## 2014-08-22 DIAGNOSIS — I4891 Unspecified atrial fibrillation: Secondary | ICD-10-CM | POA: Diagnosis not present

## 2014-08-22 LAB — POCT INR: INR: 3.7

## 2014-08-23 DIAGNOSIS — H40013 Open angle with borderline findings, low risk, bilateral: Secondary | ICD-10-CM | POA: Diagnosis not present

## 2014-09-19 ENCOUNTER — Ambulatory Visit (INDEPENDENT_AMBULATORY_CARE_PROVIDER_SITE_OTHER): Payer: Commercial Managed Care - HMO

## 2014-09-19 DIAGNOSIS — I4891 Unspecified atrial fibrillation: Secondary | ICD-10-CM | POA: Diagnosis not present

## 2014-09-19 LAB — POCT INR: INR: 3.1

## 2014-10-10 ENCOUNTER — Ambulatory Visit (INDEPENDENT_AMBULATORY_CARE_PROVIDER_SITE_OTHER): Payer: Commercial Managed Care - HMO

## 2014-10-10 DIAGNOSIS — I4891 Unspecified atrial fibrillation: Secondary | ICD-10-CM

## 2014-10-10 LAB — POCT INR: INR: 1.9

## 2014-10-13 ENCOUNTER — Telehealth: Payer: Self-pay | Admitting: *Deleted

## 2014-10-13 DIAGNOSIS — E1151 Type 2 diabetes mellitus with diabetic peripheral angiopathy without gangrene: Secondary | ICD-10-CM | POA: Diagnosis not present

## 2014-10-13 DIAGNOSIS — I739 Peripheral vascular disease, unspecified: Secondary | ICD-10-CM | POA: Diagnosis not present

## 2014-10-13 DIAGNOSIS — L603 Nail dystrophy: Secondary | ICD-10-CM | POA: Diagnosis not present

## 2014-10-13 NOTE — Telephone Encounter (Signed)
Spoke with pt, handicap placard paperwork mailed to patients confirmed home address.

## 2014-10-19 ENCOUNTER — Telehealth: Payer: Self-pay | Admitting: Cardiology

## 2014-10-19 NOTE — Telephone Encounter (Signed)
Freddi Starr, RN at 10/13/2014 12:29 PM     Status: Signed       Expand All Collapse All   Spoke with pt, handicap placard paperwork mailed to patients confirmed home address.       Patient notified of above info. She will call back if she has not received paperwork by Friday 7/8 or Monday 7/11

## 2014-10-19 NOTE — Telephone Encounter (Signed)
Pt called in stating that she brought in paperwork to receive a Handicapped sticker and she requested that once the paperwork had been filled out, that she wanted it mailed back to her. She wants to know if the paperwork has been mailed off yet. Please call her back  Thanks

## 2014-10-31 ENCOUNTER — Ambulatory Visit (INDEPENDENT_AMBULATORY_CARE_PROVIDER_SITE_OTHER): Payer: Commercial Managed Care - HMO | Admitting: *Deleted

## 2014-10-31 DIAGNOSIS — I4891 Unspecified atrial fibrillation: Secondary | ICD-10-CM | POA: Diagnosis not present

## 2014-10-31 LAB — POCT INR: INR: 2.1

## 2014-11-01 DIAGNOSIS — I739 Peripheral vascular disease, unspecified: Secondary | ICD-10-CM | POA: Diagnosis not present

## 2014-11-01 DIAGNOSIS — E782 Mixed hyperlipidemia: Secondary | ICD-10-CM | POA: Diagnosis not present

## 2014-11-01 DIAGNOSIS — E0800 Diabetes mellitus due to underlying condition with hyperosmolarity without nonketotic hyperglycemic-hyperosmolar coma (NKHHC): Secondary | ICD-10-CM | POA: Diagnosis not present

## 2014-11-01 DIAGNOSIS — I6789 Other cerebrovascular disease: Secondary | ICD-10-CM | POA: Diagnosis not present

## 2014-12-14 ENCOUNTER — Encounter: Payer: Self-pay | Admitting: Family

## 2014-12-15 ENCOUNTER — Ambulatory Visit (HOSPITAL_COMMUNITY)
Admission: RE | Admit: 2014-12-15 | Discharge: 2014-12-15 | Disposition: A | Discharge status: Home / Self Care | Payer: Commercial Managed Care - HMO | Source: Ambulatory Visit | Attending: Family | Admitting: Family

## 2014-12-15 ENCOUNTER — Encounter: Payer: Self-pay | Admitting: Family

## 2014-12-15 ENCOUNTER — Ambulatory Visit (INDEPENDENT_AMBULATORY_CARE_PROVIDER_SITE_OTHER): Payer: Commercial Managed Care - HMO | Admitting: Family

## 2014-12-15 VITALS — BP 132/74 | HR 86 | Temp 98.1°F | Resp 16 | Ht 60.0 in | Wt 94.8 lb

## 2014-12-15 DIAGNOSIS — E785 Hyperlipidemia, unspecified: Secondary | ICD-10-CM | POA: Diagnosis not present

## 2014-12-15 DIAGNOSIS — E1159 Type 2 diabetes mellitus with other circulatory complications: Secondary | ICD-10-CM

## 2014-12-15 DIAGNOSIS — Z48812 Encounter for surgical aftercare following surgery on the circulatory system: Secondary | ICD-10-CM

## 2014-12-15 DIAGNOSIS — I10 Essential (primary) hypertension: Secondary | ICD-10-CM | POA: Insufficient documentation

## 2014-12-15 DIAGNOSIS — I739 Peripheral vascular disease, unspecified: Secondary | ICD-10-CM

## 2014-12-15 DIAGNOSIS — Z4889 Encounter for other specified surgical aftercare: Secondary | ICD-10-CM

## 2014-12-15 DIAGNOSIS — Z9889 Other specified postprocedural states: Secondary | ICD-10-CM | POA: Diagnosis not present

## 2014-12-15 DIAGNOSIS — E1151 Type 2 diabetes mellitus with diabetic peripheral angiopathy without gangrene: Secondary | ICD-10-CM

## 2014-12-15 DIAGNOSIS — R252 Cramp and spasm: Secondary | ICD-10-CM | POA: Insufficient documentation

## 2014-12-15 NOTE — Patient Instructions (Signed)

## 2014-12-15 NOTE — Progress Notes (Signed)
[facility] [place] HISTORY AND PHYSICAL -PAD  History of Present Illness [name] is a 81 y.o. female patient of Dr. Darrick Penna who is s/p left above knee to below knee popliteal artery bypass graft on 07/23/10, and her bypass has been occluded since 2012. She presents for follow-up. Her atherosclerotic risk factors remain atrial fibrillation, diabetes, elevated cholesterol, hypertension. These are all currently stable and followed by the primary care physician.   She denies claudication symptoms with walking, denies non healing wounds. She had a stroke in 1999 as manifested by expressive and possibly receptive aphasia, denies hemiparesis, denies monocular loss of vision, denies any stroke or TIA activity since then; noted history of atrial fib. She is right hand dominant.  She walks for an hour continuously in her house daily. She also does push ups, stationary bike, and other exercises daily. She states her appetite is good. The patient denies New Medical or Surgical History.  Pt Diabetic: Yes, last A1C was 8.0 (November 2015, review of records), uncontrolled, pt states A1C remains 8.0 Pt smoker: non-smoker  Pt meds include: Statin :Yes Betablocker: Yes ASA: No Other anticoagulants/antiplatelets: coumadin for atrial fib     Past Medical History  Diagnosis Date  . Diabetes mellitus   . Hypertension   . Hyperlipidemia   . Leg pain   . Peripheral arterial disease   . Atrial fibrillation   . Stroke 1999    Social History Social History  Substance Use Topics  . Smoking status: Never Smoker   . Smokeless tobacco: Never Used  . Alcohol Use: No    Family History Family History  Problem Relation Age of Onset  . Hypertension Mother   . Heart attack Mother   . Diabetes Father   . Hypertension Father   . Stroke Father   . Stroke Sister   . Diabetes Sister   . Heart disease Sister     Before age 1-Aneurysm  . Dementia Sister   . Alzheimer's  disease Sister   . Diabetes Brother   . Diabetes Brother   . Heart disease Brother     Before age 79  . Heart attack Brother   . Heart attack Sister   . Diabetes Sister   . Alzheimer's disease Sister     Past Surgical History  Procedure Laterality Date  . Cesarean section    . Lumbar disc surgery    . Femoral bypass Left July 23, 2010  . Cataract extraction  2000  . Back surgery  1996  . Spine surgery    . Eye surgery      No Known Allergies  Current Outpatient Prescriptions  Medication Sig Dispense Refill  . amLODipine (NORVASC) 10 MG tablet TAKE 1 TABLET EVERY DAY 30 tablet 10  . atorvastatin (LIPITOR) 40 MG tablet TAKE 1 TABLET BY MOUTH ONCE A DAY 30 tablet 7  . carvedilol (COREG) 6.25 MG tablet Take 6.25 mg by mouth 2 (two) times daily with a meal.      . fish oil-omega-3 fatty acids 1000 MG capsule Take 1 g by mouth daily.      Marland Kitchen glipiZIDE (GLUCOTROL) 10 MG tablet Take 10 mg by mouth 2 (two) times daily before a meal.      . Lancet Devices (TRUEDRAW LANCING DEVICE) MISC     . linagliptin (TRADJENTA) 5 MG TABS tablet Take 5 mg by mouth daily.    Marland Kitchen lisinopril (PRINIVIL,ZESTRIL) 10 MG tablet TAKE 1 TABLET DAILY 30 tablet  0  . metFORMIN (GLUCOPHAGE) 500 MG tablet Take 500 mg by mouth 2 (two) times daily with a meal.      . Multiple Vitamins-Minerals (CENTRUM SILVER PO) Take 1 tablet by mouth 1 dose over 24 hours.      . TRUETEST TEST test strip     . warfarin (COUMADIN) 5 MG tablet Take 2.5-5 mg by mouth. As directed    . [DISCONTINUED] iron polysaccharides (NIFEREX) 150 MG capsule Take 150 mg by mouth daily.      . [DISCONTINUED] topiramate (TOPAMAX) 15 MG capsule Take 15 mg by mouth 2 (two) times daily.       No current facility-administered medications for this visit.    ROS: See HPI for pertinent positives and negatives.   Physical Examination  Filed Vitals:   12/15/14 1337  BP: 132/74  Pulse: 86  Temp: 98.1 F (36.7 C)  TempSrc: Oral  Resp: 16  Height:  5' (1.524 m)  Weight: 94 lb 12.8 oz (43.001 kg)  SpO2: 100%   Body mass index is 18.51 kg/(m^2).   General: A&O x 3, WDWN, thin. Gait: normal Eyes: PERRLA. Pulmonary: CTAB, without wheezes , rales or rhonchi. Cardiac: irregular Rhythm.     Carotid Bruits Right Left   Negative Negative  Aorta is slightly palpable. Radial pulses: are 2+ palpable and =   VASCULAR EXAM: Extremities without ischemic changes  without Gangrene; without open wounds.      LE Pulses Right Left   FEMORAL 2+ palpable 2+ palpable    POPLITEAL not palpable  not palpable   POSTERIOR TIBIAL biphasic by Doppler   monophasic by Doppler    DORSALIS PEDIS  ANTERIOR TIBIAL 2+ palpable  Monophasic by Doppler     Abdomen: soft, NT, no palpable masses. Skin: no rashes, no ulcers. Musculoskeletal: no muscle wasting or atrophy. Neurologic: A&O X 3; Appropriate Affect, MOTOR FUNCTION: moving all extremities equally, motor strength 4/5 throughout. Speech is fluent/normal. CN 2-12 intact.                 Non-Invasive Vascular Imaging: DATE: 12/15/2014 ABI (Date: 12/15/2014)  R: [place] (1.45, 06/10/14), DP: triphasic, PT: biphasic, TBI: 0.74 (0.92)  L: N/A ([place]), DP: monophasic, PT: monophasic, TBI: 0.45 (0.43)   ASSESSMENT: [name] is a 80 y.o. female who is s/p left above knee to below knee popliteal artery bypass graft on 07/23/10, and her bypass has been occluded since 2012. She walks and exercises a great deal daily, has no claudication symptoms, has no non healing wounds.  Toe pressures are obtained since her leg arteries are calcified. Today's TBI's and waveforms remain stable.  Dr. Darrick Penna' assessment from his 06/10/13 progress note:  Occluded left  above-knee to below-knee popliteal bypass. The patient has single vessel runoff via the posterior tibial artery previously. Revascularization would most likely require a prosthetic bypass to the posterior tibial artery with limited durability. Believe the best measure for the patient at this point would be close followup and if she develops worsening or critical limb ischemia we would consider a redo posterior tibial bypass.     PLAN:  Based on the patient's vascular studies and examination, pt will return to clinic in 1 year with ABI's. She knows to return sooner for problems.   I discussed in depth with the patient the nature of atherosclerosis, and emphasized the importance of maximal medical management including strict control of blood pressure, blood glucose, and lipid levels, obtaining regular exercise, and continued cessation  of smoking.  The patient is aware that without maximal medical management the underlying atherosclerotic disease process will progress, limiting the benefit of any interventions.  The patient was given information about PAD including signs, symptoms, treatment, what symptoms should prompt the patient to seek immediate medical care, and risk reduction measures to take.  Charisse March, RN, MSN, FNP-C [facility] Phone: [phone]  Clinic MD: Darrick Penna  12/15/2014 1:08 PM

## 2014-12-20 NOTE — Addendum Note (Signed)
Addended by: Adria Dill L on: 12/20/2014 04:56 PM   Modules accepted: Orders

## 2014-12-23 DIAGNOSIS — Z1231 Encounter for screening mammogram for malignant neoplasm of breast: Secondary | ICD-10-CM | POA: Diagnosis not present

## 2014-12-29 DIAGNOSIS — E1151 Type 2 diabetes mellitus with diabetic peripheral angiopathy without gangrene: Secondary | ICD-10-CM | POA: Diagnosis not present

## 2014-12-29 DIAGNOSIS — I739 Peripheral vascular disease, unspecified: Secondary | ICD-10-CM | POA: Diagnosis not present

## 2014-12-29 DIAGNOSIS — L603 Nail dystrophy: Secondary | ICD-10-CM | POA: Diagnosis not present

## 2014-12-30 ENCOUNTER — Ambulatory Visit (INDEPENDENT_AMBULATORY_CARE_PROVIDER_SITE_OTHER): Payer: Commercial Managed Care - HMO | Admitting: *Deleted

## 2014-12-30 DIAGNOSIS — I4891 Unspecified atrial fibrillation: Secondary | ICD-10-CM

## 2014-12-30 LAB — POCT INR: INR: 2.1

## 2015-01-03 DIAGNOSIS — E0800 Diabetes mellitus due to underlying condition with hyperosmolarity without nonketotic hyperglycemic-hyperosmolar coma (NKHHC): Secondary | ICD-10-CM | POA: Diagnosis not present

## 2015-01-03 DIAGNOSIS — I739 Peripheral vascular disease, unspecified: Secondary | ICD-10-CM | POA: Diagnosis not present

## 2015-01-03 DIAGNOSIS — I6789 Other cerebrovascular disease: Secondary | ICD-10-CM | POA: Diagnosis not present

## 2015-01-03 DIAGNOSIS — I10 Essential (primary) hypertension: Secondary | ICD-10-CM | POA: Diagnosis not present

## 2015-01-27 ENCOUNTER — Ambulatory Visit (INDEPENDENT_AMBULATORY_CARE_PROVIDER_SITE_OTHER): Payer: Commercial Managed Care - HMO | Admitting: *Deleted

## 2015-01-27 DIAGNOSIS — I4891 Unspecified atrial fibrillation: Secondary | ICD-10-CM

## 2015-01-27 LAB — POCT INR: INR: 2.5

## 2015-03-02 DIAGNOSIS — H40013 Open angle with borderline findings, low risk, bilateral: Secondary | ICD-10-CM | POA: Diagnosis not present

## 2015-03-07 ENCOUNTER — Ambulatory Visit (INDEPENDENT_AMBULATORY_CARE_PROVIDER_SITE_OTHER): Payer: Commercial Managed Care - HMO | Admitting: *Deleted

## 2015-03-07 DIAGNOSIS — I4891 Unspecified atrial fibrillation: Secondary | ICD-10-CM

## 2015-03-07 LAB — POCT INR: INR: 2.5

## 2015-03-07 NOTE — Progress Notes (Signed)
HPI: FU paroxysmal atrial fibrillation and prior CVA. She had a Myoview performed on Aug 27, 2005, that showed an ejection fraction of 80% and normal perfusion. Last echocardiogram in July of 2009 showed normal LV function and mild left atrial enlargement. Patient followed by vascular surgery for peripheral vascular disease. Since I last saw her, the patient denies any dyspnea on exertion, orthopnea, PND, pedal edema, palpitations, syncope or chest pain.   Current Outpatient Prescriptions  Medication Sig Dispense Refill  . amLODipine (NORVASC) 10 MG tablet TAKE 1 TABLET EVERY DAY 30 tablet 10  . atorvastatin (LIPITOR) 40 MG tablet TAKE 1 TABLET BY MOUTH ONCE A DAY 30 tablet 7  . carvedilol (COREG) 6.25 MG tablet Take 6.25 mg by mouth 2 (two) times daily with a meal.      . fish oil-omega-3 fatty acids 1000 MG capsule Take 1 g by mouth daily.      Marland Kitchen glipiZIDE (GLUCOTROL) 10 MG tablet Take 10 mg by mouth 2 (two) times daily before a meal.      . Lancet Devices (TRUEDRAW LANCING DEVICE) MISC     . latanoprost (XALATAN) 0.005 % ophthalmic solution Place 1 drop into both eyes at bedtime.    Marland Kitchen linagliptin (TRADJENTA) 5 MG TABS tablet Take 5 mg by mouth daily.    Marland Kitchen lisinopril (PRINIVIL,ZESTRIL) 10 MG tablet TAKE 1 TABLET DAILY 30 tablet 0  . metFORMIN (GLUCOPHAGE) 500 MG tablet Take 500 mg by mouth 2 (two) times daily with a meal.      . Multiple Vitamins-Minerals (CENTRUM SILVER PO) Take 1 tablet by mouth 1 dose over 24 hours.      . TRUETEST TEST test strip     . warfarin (COUMADIN) 5 MG tablet Take 2.5-5 mg by mouth. As directed    . [DISCONTINUED] iron polysaccharides (NIFEREX) 150 MG capsule Take 150 mg by mouth daily.      . [DISCONTINUED] topiramate (TOPAMAX) 15 MG capsule Take 15 mg by mouth 2 (two) times daily.       No current facility-administered medications for this visit.     Past Medical History  Diagnosis Date  . Diabetes mellitus   . Hypertension   . Hyperlipidemia     . Leg pain   . Peripheral arterial disease (HCC)   . Atrial fibrillation (HCC)   . Stroke (HCC) 1999  . DVT (deep venous thrombosis) [facility])     Past Surgical History  Procedure Laterality Date  . Cesarean section    . Lumbar disc surgery    . Femoral bypass Left July 23, 2010  . Cataract extraction  2000  . Back surgery  1996  . Spine surgery    . Eye surgery      Social History   Social History  . Marital Status: Widowed    Spouse Name: N/A  . Number of Children: N/A  . Years of Education: N/A   Occupational History  . Not on file.   Social History Main Topics  . Smoking status: Never Smoker   . Smokeless tobacco: Never Used  . Alcohol Use: No  . Drug Use: No  . Sexual Activity: Not on file   Other Topics Concern  . Not on file   Social History Narrative    ROS: no fevers or chills, productive cough, hemoptysis, dysphasia, odynophagia, melena, hematochezia, dysuria, hematuria, rash, seizure activity, orthopnea, PND, pedal edema, claudication. Remaining systems are negative.  Physical Exam: Well-developed well-nourished in no  acute distress.  Skin is warm and dry.  HEENT is normal.  Neck is supple.  Chest is clear to auscultation with normal expansion.  Cardiovascular exam is regular rate and rhythm.  Abdominal exam nontender or distended. No masses palpated. Extremities show no edema. neuro grossly intact  ECG Sinus rhythm with occasional PAC. No ST changes.

## 2015-03-16 ENCOUNTER — Ambulatory Visit (INDEPENDENT_AMBULATORY_CARE_PROVIDER_SITE_OTHER): Payer: Commercial Managed Care - HMO | Admitting: Cardiology

## 2015-03-16 ENCOUNTER — Encounter: Payer: Self-pay | Admitting: Cardiology

## 2015-03-16 VITALS — BP 112/60 | HR 81 | Ht 60.0 in | Wt 96.3 lb

## 2015-03-16 DIAGNOSIS — Z5181 Encounter for therapeutic drug level monitoring: Secondary | ICD-10-CM | POA: Diagnosis not present

## 2015-03-16 DIAGNOSIS — I10 Essential (primary) hypertension: Secondary | ICD-10-CM | POA: Diagnosis not present

## 2015-03-16 DIAGNOSIS — I739 Peripheral vascular disease, unspecified: Secondary | ICD-10-CM | POA: Diagnosis not present

## 2015-03-16 DIAGNOSIS — E785 Hyperlipidemia, unspecified: Secondary | ICD-10-CM

## 2015-03-16 DIAGNOSIS — I480 Paroxysmal atrial fibrillation: Secondary | ICD-10-CM

## 2015-03-16 NOTE — Assessment & Plan Note (Addendum)
Patient remains in sinus rhythm. Continue beta blocker and Coumadin. Check hemoglobin. I have provided the names of DOACs. She will contact her insurance company to see if these are affordable for her. If so we will change from Coumadin.

## 2015-03-16 NOTE — Patient Instructions (Signed)
Your physician recommends that you return for lab work at your earliest convenience - FASTING.  Dr Jens Som recommends that you schedule a follow-up appointment in 1 year. You will receive a reminder letter in the mail two months in advance. If you don't receive a letter, please call our office to schedule the follow-up appointment.  If you need a refill on your cardiac medications before your next appointment, please call your pharmacy.   Other blood thinners -   Xarelto Pradaxa Eliquis

## 2015-03-16 NOTE — Assessment & Plan Note (Signed)
Blood pressure controlled. Continue present medications. Check potassium and renal function. 

## 2015-03-16 NOTE — Assessment & Plan Note (Signed)
Continue statin. No aspirin given need for Coumadin.Followed by vascular surgery.

## 2015-03-16 NOTE — Assessment & Plan Note (Signed)
Continue statin. Check lipids and liver. 

## 2015-03-22 DIAGNOSIS — Z5181 Encounter for therapeutic drug level monitoring: Secondary | ICD-10-CM | POA: Diagnosis not present

## 2015-03-22 DIAGNOSIS — E785 Hyperlipidemia, unspecified: Secondary | ICD-10-CM | POA: Diagnosis not present

## 2015-03-22 LAB — CBC
HCT: 38.1 % (ref 36.0–46.0)
Hemoglobin: 12.2 g/dL (ref 12.0–15.0)
MCH: 32 pg (ref 26.0–34.0)
MCHC: 32 g/dL (ref 30.0–36.0)
MCV: 100 fL (ref 78.0–100.0)
MPV: 10.9 fL (ref 8.6–12.4)
PLATELETS: 227 10*3/uL (ref 150–400)
RBC: 3.81 MIL/uL — AB (ref 3.87–5.11)
RDW: 13.9 % (ref 11.5–15.5)
WBC: 4.8 10*3/uL (ref 4.0–10.5)

## 2015-03-23 ENCOUNTER — Encounter: Payer: Self-pay | Admitting: *Deleted

## 2015-03-23 LAB — BASIC METABOLIC PANEL
BUN: 18 mg/dL (ref 7–25)
CALCIUM: 9.5 mg/dL (ref 8.6–10.4)
CO2: 30 mmol/L (ref 20–31)
CREATININE: 0.75 mg/dL (ref 0.60–0.88)
Chloride: 102 mmol/L (ref 98–110)
Glucose, Bld: 140 mg/dL — ABNORMAL HIGH (ref 65–99)
Potassium: 4.5 mmol/L (ref 3.5–5.3)
Sodium: 142 mmol/L (ref 135–146)

## 2015-03-23 LAB — HEPATIC FUNCTION PANEL
ALBUMIN: 4.1 g/dL (ref 3.6–5.1)
ALK PHOS: 61 U/L (ref 33–130)
ALT: 28 U/L (ref 6–29)
AST: 22 U/L (ref 10–35)
BILIRUBIN INDIRECT: 0.3 mg/dL (ref 0.2–1.2)
BILIRUBIN TOTAL: 0.4 mg/dL (ref 0.2–1.2)
Bilirubin, Direct: 0.1 mg/dL (ref <=0.2)
Total Protein: 7.1 g/dL (ref 6.1–8.1)

## 2015-03-23 LAB — LIPID PANEL
CHOL/HDL RATIO: 2.4 ratio (ref <=5.0)
Cholesterol: 104 mg/dL — ABNORMAL LOW (ref 125–200)
HDL: 43 mg/dL — AB (ref >=46)
LDL CALC: 51 mg/dL (ref <130)
Triglycerides: 52 mg/dL (ref <150)
VLDL: 10 mg/dL (ref <30)

## 2015-04-18 ENCOUNTER — Ambulatory Visit (INDEPENDENT_AMBULATORY_CARE_PROVIDER_SITE_OTHER): Payer: Commercial Managed Care - HMO | Admitting: *Deleted

## 2015-04-18 DIAGNOSIS — I4891 Unspecified atrial fibrillation: Secondary | ICD-10-CM | POA: Diagnosis not present

## 2015-04-18 LAB — POCT INR: INR: 1.9

## 2015-04-18 MED ORDER — APIXABAN 2.5 MG PO TABS: 2.5000 mg | ORAL_TABLET | Freq: Two times a day (BID) | ORAL | Status: DC

## 2015-04-20 ENCOUNTER — Telehealth: Payer: Self-pay

## 2015-04-20 NOTE — Telephone Encounter (Signed)
Eliquis 2.5mg  approved by [facility]  And is good through 04/14/2016.

## 2015-04-20 NOTE — Telephone Encounter (Signed)
Eliquis 2.5mg  approved by [facility] through 04/14/2016.

## 2015-05-10 DIAGNOSIS — E782 Mixed hyperlipidemia: Secondary | ICD-10-CM | POA: Diagnosis not present

## 2015-05-10 DIAGNOSIS — I10 Essential (primary) hypertension: Secondary | ICD-10-CM | POA: Diagnosis not present

## 2015-05-10 DIAGNOSIS — E089 Diabetes mellitus due to underlying condition without complications: Secondary | ICD-10-CM | POA: Diagnosis not present

## 2015-05-10 DIAGNOSIS — I739 Peripheral vascular disease, unspecified: Secondary | ICD-10-CM | POA: Diagnosis not present

## 2015-05-17 DIAGNOSIS — H401211 Low-tension glaucoma, right eye, mild stage: Secondary | ICD-10-CM | POA: Diagnosis not present

## 2015-05-17 DIAGNOSIS — H43393 Other vitreous opacities, bilateral: Secondary | ICD-10-CM | POA: Diagnosis not present

## 2015-05-17 DIAGNOSIS — E119 Type 2 diabetes mellitus without complications: Secondary | ICD-10-CM | POA: Diagnosis not present

## 2015-05-17 DIAGNOSIS — H40012 Open angle with borderline findings, low risk, left eye: Secondary | ICD-10-CM | POA: Diagnosis not present

## 2015-05-17 DIAGNOSIS — Z961 Presence of intraocular lens: Secondary | ICD-10-CM | POA: Diagnosis not present

## 2015-05-23 ENCOUNTER — Ambulatory Visit (INDEPENDENT_AMBULATORY_CARE_PROVIDER_SITE_OTHER): Payer: Commercial Managed Care - HMO

## 2015-05-23 DIAGNOSIS — I4891 Unspecified atrial fibrillation: Secondary | ICD-10-CM

## 2015-05-23 DIAGNOSIS — Z5181 Encounter for therapeutic drug level monitoring: Secondary | ICD-10-CM | POA: Diagnosis not present

## 2015-05-23 LAB — CBC
HEMATOCRIT: 37.2 % (ref 36.0–46.0)
HEMOGLOBIN: 12.2 g/dL (ref 12.0–15.0)
MCH: 32.8 pg (ref 26.0–34.0)
MCHC: 32.8 g/dL (ref 30.0–36.0)
MCV: 100 fL (ref 78.0–100.0)
MPV: 10.9 fL (ref 8.6–12.4)
Platelets: 200 10*3/uL (ref 150–400)
RBC: 3.72 MIL/uL — ABNORMAL LOW (ref 3.87–5.11)
RDW: 13.8 % (ref 11.5–15.5)
WBC: 5.9 10*3/uL (ref 4.0–10.5)

## 2015-05-23 LAB — BASIC METABOLIC PANEL
BUN: 16 mg/dL (ref 7–25)
CO2: 25 mmol/L (ref 20–31)
CREATININE: 0.8 mg/dL (ref 0.60–0.88)
Calcium: 9.7 mg/dL (ref 8.6–10.4)
Chloride: 102 mmol/L (ref 98–110)
GLUCOSE: 187 mg/dL — AB (ref 65–99)
POTASSIUM: 4.2 mmol/L (ref 3.5–5.3)
Sodium: 140 mmol/L (ref 135–146)

## 2015-05-23 NOTE — Progress Notes (Signed)
Pt was started on Eliquis 2.5mg  BID by Dr Jens Som for afib on 04/18/2015.    Reviewed patients medication list.  Pt is not currently on any combined P-gp and strong CYP3A4 inhibitors/inducers (ketoconazole, traconazole, ritonavir, carbamazepine, phenytoin, rifampin, St. John's wort).  Reviewed labs.  SCr 0.80 Weight 96.12, Age 82  Dose appropriate based on specified criteria.   Hgb and HCT 12.2/37.2.  A full discussion of the nature of anticoagulants has been carried out.  A benefit/risk analysis has been presented to the patient, so that they understand the justification for choosing anticoagulation with Eliquis at this time.  The need for compliance is stressed.  Pt is aware to take the medication twice daily.  Side effects of potential bleeding are discussed, including unusual colored urine or stools, coughing up blood or coffee ground emesis, nose bleeds or serious fall or head trauma.  Discussed signs and symptoms of stroke. The patient should avoid any OTC items containing aspirin or ibuprofen.  Avoid alcohol consumption.   Call if any signs of abnormal bleeding.  Discussed financial obligations and resolved any difficulty in obtaining medication. Called pt on 05/24/15 and reviewed lab results.  Next lab test test in 6 months on 11/24/15@ 11am.

## 2015-05-23 NOTE — Patient Instructions (Signed)

## 2015-06-28 ENCOUNTER — Ambulatory Visit: Payer: Self-pay | Admitting: Pharmacist

## 2015-06-28 DIAGNOSIS — I480 Paroxysmal atrial fibrillation: Secondary | ICD-10-CM

## 2015-09-04 ENCOUNTER — Encounter: Payer: Self-pay | Admitting: Family

## 2015-09-07 ENCOUNTER — Encounter: Payer: Self-pay | Admitting: Family

## 2015-09-07 ENCOUNTER — Ambulatory Visit (INDEPENDENT_AMBULATORY_CARE_PROVIDER_SITE_OTHER): Payer: Commercial Managed Care - HMO | Admitting: Family

## 2015-09-07 VITALS — BP 134/74 | HR 74 | Temp 97.4°F | Resp 16 | Ht 59.0 in | Wt 95.0 lb

## 2015-09-07 DIAGNOSIS — Z9889 Other specified postprocedural states: Secondary | ICD-10-CM | POA: Diagnosis not present

## 2015-09-07 DIAGNOSIS — E1151 Type 2 diabetes mellitus with diabetic peripheral angiopathy without gangrene: Secondary | ICD-10-CM

## 2015-09-07 DIAGNOSIS — S99922A Unspecified injury of left foot, initial encounter: Secondary | ICD-10-CM

## 2015-09-07 DIAGNOSIS — I779 Disorder of arteries and arterioles, unspecified: Secondary | ICD-10-CM

## 2015-09-07 NOTE — Progress Notes (Deleted)
[facility] [place]   CC: Injury to left toes  History of Present Illness [name] is a 82 y.o. female patient of Dr. Darrick Penna who is s/p left above knee to below knee popliteal artery bypass graft on 07/23/10, and her bypass has been occluded since 2012. She returns today with c/o stubbed her left 2-3 toes about 2 weeks ago and they are still painful. Saw her Podiatrist and he said they aren't broken. No xrays done though. Patient called and was given appt with [place]. *** last ABIs done 12-15-14.   Her atherosclerotic risk factors remain atrial fibrillation, diabetes, elevated cholesterol, hypertension. These are all currently stable and followed by the primary care physician.   She denies claudication symptoms with walking, denies non healing wounds. She had a stroke in 1999 as manifested by expressive and possibly receptive aphasia, denies hemiparesis, denies monocular loss of vision, denies any stroke or TIA activity since then; noted history of atrial fib. She is right hand dominant.  She walks for an hour continuously in her house daily. She also does push ups, stationary bike, and other exercises daily. She states her appetite is good. The patient denies New Medical or Surgical History.  Pt Diabetic: Yes, last A1C was 8.0 (November 2015, review of records), uncontrolled, pt states A1C remains 8.0 Pt smoker: non-smoker  Pt meds include: Statin :Yes Betablocker: Yes ASA: No Other anticoagulants/antiplatelets: coumadin for atrial fib    Past Medical History  Diagnosis Date  . Diabetes mellitus   . Hypertension   . Hyperlipidemia   . Leg pain   . Peripheral arterial disease (HCC)   . Atrial fibrillation (HCC)   . Stroke (HCC) 1999  . DVT (deep venous thrombosis) (HCC)     Social History Social History  Substance Use Topics  . Smoking status: Never Smoker   . Smokeless tobacco: Never Used  . Alcohol Use: No    Family History Family History   Problem Relation Age of Onset  . Hypertension Mother   . Heart attack Mother   . Diabetes Father   . Hypertension Father   . Stroke Father   . Stroke Sister   . Diabetes Sister   . Heart disease Sister     Before age 39-Aneurysm  . Dementia Sister   . Alzheimer's disease Sister   . Diabetes Brother   . Diabetes Brother   . Heart disease Brother     Before age 27  . Heart attack Brother   . Heart attack Sister   . Diabetes Sister   . Alzheimer's disease Sister     Past Surgical History  Procedure Laterality Date  . Cesarean section    . Lumbar disc surgery    . Femoral bypass Left July 23, 2010  . Cataract extraction  2000  . Back surgery  1996  . Spine surgery    . Eye surgery      No Known Allergies  Current Outpatient Prescriptions  Medication Sig Dispense Refill  . amLODipine (NORVASC) 10 MG tablet TAKE 1 TABLET EVERY DAY 30 tablet 10  . apixaban (ELIQUIS) 2.5 MG TABS tablet Take 1 tablet (2.5 mg total) by mouth 2 (two) times daily. 60 tablet 11  . atorvastatin (LIPITOR) 40 MG tablet TAKE 1 TABLET BY MOUTH ONCE A DAY 30 tablet 7  . carvedilol (COREG) 6.25 MG tablet Take 6.25 mg by mouth 2 (two) times daily with a meal.      . fish  oil-omega-3 fatty acids 1000 MG capsule Take 1 g by mouth daily.      Marland Kitchen glipiZIDE (GLUCOTROL) 10 MG tablet Take 10 mg by mouth 2 (two) times daily before a meal.      . Lancet Devices (TRUEDRAW LANCING DEVICE) MISC     . latanoprost (XALATAN) 0.005 % ophthalmic solution Place 1 drop into both eyes at bedtime.    Marland Kitchen linagliptin (TRADJENTA) 5 MG TABS tablet Take 5 mg by mouth daily.    Marland Kitchen lisinopril (PRINIVIL,ZESTRIL) 10 MG tablet TAKE 1 TABLET DAILY 30 tablet 0  . metFORMIN (GLUCOPHAGE) 500 MG tablet Take 500 mg by mouth 2 (two) times daily with a meal.      . Multiple Vitamins-Minerals (CENTRUM SILVER PO) Take 1 tablet by mouth 1 dose over 24 hours.      . TRUETEST TEST test strip     . [DISCONTINUED] iron polysaccharides (NIFEREX) 150  MG capsule Take 150 mg by mouth daily.      . [DISCONTINUED] topiramate (TOPAMAX) 15 MG capsule Take 15 mg by mouth 2 (two) times daily.       No current facility-administered medications for this visit.    ROS: See HPI for pertinent positives and negatives.   Physical Examination  Filed Vitals:   09/07/15 1051  BP: 134/74  Pulse: 74  Temp: 97.4 F (36.3 C)  TempSrc: Oral  Resp: 16  Height: 4\' 11"  (1.499 m)  Weight: 95 lb (43.092 kg)  SpO2: 99%   Body mass index is 19.18 kg/(m^2).  General: A&O x 3, WDWN, thin. Gait: normal Eyes: PERRLA. Pulmonary: CTAB, without wheezes , rales or rhonchi. Cardiac: irregular Rhythm.     Carotid Bruits Right Left   Negative Negative  Aorta is slightly palpable. Radial pulses: are 2+ palpable and =   VASCULAR EXAM: Extremities without ischemic changes  without Gangrene; without open wounds.      LE Pulses Right Left   FEMORAL 2+ palpable 2+ palpable    POPLITEAL not palpable  not palpable   POSTERIOR TIBIAL biphasic by Doppler   monophasic by Doppler    DORSALIS PEDIS  ANTERIOR TIBIAL 2+ palpable  Monophasic by Doppler     Abdomen: soft, NT, no palpable masses. Skin: no rashes, no ulcers. Musculoskeletal: no muscle wasting or atrophy. Neurologic: A&O X 3; Appropriate Affect, MOTOR FUNCTION: moving all extremities equally, motor strength 4/5 throughout. Speech is fluent/normal. CN 2-12 intact                      Non-Invasive Vascular Imaging: ABI (Date: 12/15/2014)  R: [place] (1.45, 06/10/14), DP: triphasic, PT: biphasic, TBI: 0.74 (0.92)  L: N/A ([place]), DP: monophasic, PT: monophasic, TBI: 0.45 (0.43)   ASSESSMENT: [name] is a 82 y.o.  female who is s/p left above knee to below knee popliteal artery bypass graft on 07/23/10, and her bypass has been occluded since 2012. She walks and exercises a great deal daily, has no claudication symptoms, has no non healing wounds.    PLAN:  She is already scheduled to return in September 2017 with ABI's. She knows to return sooner for problems.  Based on the patient's vascular studies and examination, pt will return to clinic in {NUMBERS 1-5:20334} {days/wks/mos/yrs:310907}  I discussed in depth with the patient the nature of atherosclerosis, and emphasized the importance of maximal medical management including strict control of blood pressure, blood glucose, and lipid levels, obtaining regular exercise, and ***cessation of smoking.  The patient is aware that without maximal medical management the underlying atherosclerotic disease process will progress, limiting the benefit of any interventions.  The patient was given information about PAD including signs, symptoms, treatment, what symptoms should prompt the patient to seek immediate medical care, and risk reduction measures to take.  Charisse March, RN, MSN, FNP-C [facility] Phone: [phone]  Clinic MD: [facility]  09/07/2015 10:57 AM

## 2015-09-07 NOTE — Progress Notes (Signed)
[facility] [place]   CC: Injury to left toes  History of Present Illness [name] is a 82 y.o. female patient of Dr. Darrick Penna who is s/p left above knee to below knee popliteal artery bypass graft on 07/23/10, and her bypass has been occluded since 2012. She returns today with c/o stubbed her left 2-3 toes about 2 weeks ago and they are still painful. Saw her Podiatrist and he said they aren't broken. No xrays done though. Patient called and was given appt with [place]. Last ABIs done 12-15-14.   Her atherosclerotic risk factors remain atrial fibrillation, diabetes, elevated cholesterol, hypertension. These are all currently stable and followed by the primary care physician.   She denies claudication symptoms with walking, denies non healing wounds. She had a stroke in 1999 as manifested by expressive and possibly receptive aphasia, denies hemiparesis, denies monocular loss of vision, denies any stroke or TIA activity since then; noted history of atrial fib. She is right hand dominant.  She walks for an hour continuously in her house daily. She also does push ups, stationary bike, and other exercises daily. She states her appetite is good.  Pt Diabetic: Yes, last A1C was 8.0 (November 2015, review of records), uncontrolled, pt states A1C remains 8.0 Pt smoker: non-smoker  Pt meds include: Statin :Yes Betablocker: Yes ASA: No Other anticoagulants/antiplatelets: coumadin for atrial fib    Past Medical History  Diagnosis Date  . Diabetes mellitus   . Hypertension   . Hyperlipidemia   . Leg pain   . Peripheral arterial disease (HCC)   . Atrial fibrillation (HCC)   . Stroke (HCC) 1999  . DVT (deep venous thrombosis) (HCC)     Social History Social History  Substance Use Topics  . Smoking status: Never Smoker   . Smokeless tobacco: Never Used  . Alcohol Use: No    Family History Family History  Problem Relation Age of Onset  . Hypertension Mother    . Heart attack Mother   . Diabetes Father   . Hypertension Father   . Stroke Father   . Stroke Sister   . Diabetes Sister   . Heart disease Sister     Before age 61-Aneurysm  . Dementia Sister   . Alzheimer's disease Sister   . Diabetes Brother   . Diabetes Brother   . Heart disease Brother     Before age 74  . Heart attack Brother   . Heart attack Sister   . Diabetes Sister   . Alzheimer's disease Sister     Past Surgical History  Procedure Laterality Date  . Cesarean section    . Lumbar disc surgery    . Femoral bypass Left July 23, 2010  . Cataract extraction  2000  . Back surgery  1996  . Spine surgery    . Eye surgery      No Known Allergies  Current Outpatient Prescriptions  Medication Sig Dispense Refill  . amLODipine (NORVASC) 10 MG tablet TAKE 1 TABLET EVERY DAY 30 tablet 10  . apixaban (ELIQUIS) 2.5 MG TABS tablet Take 1 tablet (2.5 mg total) by mouth 2 (two) times daily. 60 tablet 11  . atorvastatin (LIPITOR) 40 MG tablet TAKE 1 TABLET BY MOUTH ONCE A DAY 30 tablet 7  . carvedilol (COREG) 6.25 MG tablet Take 6.25 mg by mouth 2 (two) times daily with a meal.      . fish oil-omega-3 fatty acids 1000 MG capsule Take 1 g  by mouth daily.      Marland Kitchen glipiZIDE (GLUCOTROL) 10 MG tablet Take 10 mg by mouth 2 (two) times daily before a meal.      . Lancet Devices (TRUEDRAW LANCING DEVICE) MISC     . latanoprost (XALATAN) 0.005 % ophthalmic solution Place 1 drop into both eyes at bedtime.    Marland Kitchen linagliptin (TRADJENTA) 5 MG TABS tablet Take 5 mg by mouth daily.    Marland Kitchen lisinopril (PRINIVIL,ZESTRIL) 10 MG tablet TAKE 1 TABLET DAILY 30 tablet 0  . metFORMIN (GLUCOPHAGE) 500 MG tablet Take 500 mg by mouth 2 (two) times daily with a meal.      . Multiple Vitamins-Minerals (CENTRUM SILVER PO) Take 1 tablet by mouth 1 dose over 24 hours.      . TRUETEST TEST test strip     . [DISCONTINUED] iron polysaccharides (NIFEREX) 150 MG capsule Take 150 mg by mouth daily.      .  [DISCONTINUED] topiramate (TOPAMAX) 15 MG capsule Take 15 mg by mouth 2 (two) times daily.       No current facility-administered medications for this visit.    ROS: See HPI for pertinent positives and negatives.   Physical Examination  Filed Vitals:   09/07/15 1051  BP: 134/74  Pulse: 74  Temp: 97.4 F (36.3 C)  TempSrc: Oral  Resp: 16  Height:  (1.499 m)  Weight: 95 lb (43.092 kg)  SpO2: 99%   Body mass index is 19.18 kg/(m^2).  General: A&O x 3, WDWN, thin female. Gait: normal Eyes: PERRLA. Pulmonary: CTAB, without wheezes , rales or rhonchi. Cardiac: irregular Rhythm.     Carotid Bruits Right Left   Negative Negative  Aorta is slightly palpable. Radial pulses: are 2+ palpable and =   VASCULAR EXAM: Extremities without ischemic changes  without Gangrene; without open wounds.      LE Pulses Right Left   FEMORAL 2+ palpable 2+ palpable    POPLITEAL not palpable  not palpable   POSTERIOR TIBIAL biphasic by Doppler   monophasic by Doppler    DORSALIS PEDIS  ANTERIOR TIBIAL 2+ palpable  Monophasic by Doppler     Abdomen: soft, NT, no palpable masses. Skin: no rashes, no ulcers. Musculoskeletal: no muscle wasting or atrophy. Neurologic: A&O X 3; Appropriate Affect, MOTOR FUNCTION: moving all extremities equally, motor strength 4/5 throughout. Speech is fluent/normal. CN 2-12 intact                      Non-Invasive Vascular Imaging: ABI (Date: 12/15/2014)  R: Abie (1.45, 06/10/14), DP: triphasic, PT: biphasic, TBI: 0.74 (0.92)  L: N/A ([name]), DP: monophasic, PT: monophasic, TBI: 0.45 (0.43)   ASSESSMENT: [name] is a 82 y.o. female who is s/p left above knee to below  knee popliteal artery bypass graft on 07/23/10, and her bypass has been occluded since 2012. She walks and exercises a great deal daily, has no claudication symptoms, has no non healing wounds.  She stubbed her left 2nd and 3rd toes two weeks ago with her shoes on.  She has minimal pain and swelling in the toes at this pint, no open wounds, no signs of ischemia. Tips of all toes are pink and warm with brisk capillary refill.  2nd and 3rd left toes buddy taped. I advised pt to elevate her legs above her heart when she is not walking to expedite healing of her stubbed toes.   PLAN:  She is already scheduled to return  in September 2017 with ABI's. She knows to return sooner for problems.   I discussed in depth with the patient the nature of atherosclerosis, and emphasized the importance of maximal medical management including strict control of blood pressure, blood glucose, and lipid levels, obtaining regular exercise, and continued cessation of smoking.  The patient is aware that without maximal medical management the underlying atherosclerotic disease process will progress, limiting the benefit of any interventions.  The patient was given information about PAD including signs, symptoms, treatment, what symptoms should prompt the patient to seek immediate medical care, and risk reduction measures to take.  Charisse March, RN, MSN, FNP-C [facility] [place] Phone: [phone]  Clinic MD: [facility]  09/07/2015 10:57 AM

## 2015-09-08 DIAGNOSIS — I10 Essential (primary) hypertension: Secondary | ICD-10-CM | POA: Diagnosis not present

## 2015-09-08 DIAGNOSIS — E782 Mixed hyperlipidemia: Secondary | ICD-10-CM | POA: Diagnosis not present

## 2015-09-08 DIAGNOSIS — Z681 Body mass index (BMI) 19 or less, adult: Secondary | ICD-10-CM | POA: Diagnosis not present

## 2015-09-08 DIAGNOSIS — E0800 Diabetes mellitus due to underlying condition with hyperosmolarity without nonketotic hyperglycemic-hyperosmolar coma (NKHHC): Secondary | ICD-10-CM | POA: Diagnosis not present

## 2015-09-08 DIAGNOSIS — I6789 Other cerebrovascular disease: Secondary | ICD-10-CM | POA: Diagnosis not present

## 2015-09-18 DIAGNOSIS — H401211 Low-tension glaucoma, right eye, mild stage: Secondary | ICD-10-CM | POA: Diagnosis not present

## 2015-09-18 DIAGNOSIS — H40012 Open angle with borderline findings, low risk, left eye: Secondary | ICD-10-CM | POA: Diagnosis not present

## 2015-12-11 ENCOUNTER — Telehealth: Payer: Self-pay | Admitting: Cardiology

## 2015-12-11 ENCOUNTER — Ambulatory Visit (INDEPENDENT_AMBULATORY_CARE_PROVIDER_SITE_OTHER): Payer: Commercial Managed Care - HMO | Admitting: *Deleted

## 2015-12-11 DIAGNOSIS — I4891 Unspecified atrial fibrillation: Secondary | ICD-10-CM | POA: Diagnosis not present

## 2015-12-11 LAB — CBC
HEMATOCRIT: 37.3 % (ref 35.0–45.0)
Hemoglobin: 12.2 g/dL (ref 11.7–15.5)
MCH: 32.8 pg (ref 27.0–33.0)
MCHC: 32.7 g/dL (ref 32.0–36.0)
MCV: 100.3 fL — ABNORMAL HIGH (ref 80.0–100.0)
MPV: 10.5 fL (ref 7.5–12.5)
PLATELETS: 231 10*3/uL (ref 140–400)
RBC: 3.72 MIL/uL — ABNORMAL LOW (ref 3.80–5.10)
RDW: 13.4 % (ref 11.0–15.0)
WBC: 5.1 10*3/uL (ref 3.8–10.8)

## 2015-12-11 LAB — BASIC METABOLIC PANEL
BUN: 16 mg/dL (ref 7–25)
CO2: 29 mmol/L (ref 20–31)
Calcium: 10.4 mg/dL (ref 8.6–10.4)
Chloride: 102 mmol/L (ref 98–110)
Creat: 0.78 mg/dL (ref 0.60–0.88)
Glucose, Bld: 145 mg/dL — ABNORMAL HIGH (ref 65–99)
POTASSIUM: 4.3 mmol/L (ref 3.5–5.3)
SODIUM: 140 mmol/L (ref 135–146)

## 2015-12-11 NOTE — Telephone Encounter (Signed)
Mrs. Vinsant is returning a call . Please call   Thanks

## 2015-12-11 NOTE — Progress Notes (Signed)
Pt was started on Eliquis 2.5mg  twice a day for AFIB in April 18, 2015 by Dr. Jens Som.    Reviewed patients medication list.  Pt is not currently on any combined P-gp and strong CYP3A4 inhibitors/inducers (ketoconazole, traconazole, ritonavir, carbamazepine, phenytoin, rifampin, St. John's wort).  Reviewed labs: SCr-0.78, Weight-43.1 kg, dose appropriate based on age, weight, SCr, and dosing criteria. Hgb and HCT 12.2/37.3  A full discussion of the nature of anticoagulants has been carried out.  A benefit/risk analysis has been presented to the patient, so that they understand the justification for choosing anticoagulation with Eliquis at this time.  The need for compliance is stressed.  Pt is aware to take the medication twice daily.  Side effects of potential bleeding are discussed, including unusual colored urine or stools, coughing up blood or coffee ground emesis, nose bleeds or serious fall or head trauma.  Discussed signs and symptoms of stroke. The patient should avoid any OTC items containing aspirin or ibuprofen.  Avoid alcohol consumption.   Call if any signs of abnormal bleeding.  Discussed financial obligations and resolved any difficulty in obtaining medication.   12/11/15-Spoke with pt & instructed pt to continue taking Eliquis 2.5mg  twice a day.  Instructed pt to call with any issues or contact Dr. Jens Som & she verbalized understanding.  Appt set for a 6 month follow-up

## 2015-12-11 NOTE — Telephone Encounter (Signed)
Spoke with pt. Please refer to Anticoagulation encounter on today for details.  Nitya Cauthon, Sherrilee Gilles, RN

## 2015-12-12 ENCOUNTER — Telehealth: Payer: Self-pay | Admitting: Cardiology

## 2015-12-12 ENCOUNTER — Encounter: Payer: Self-pay | Admitting: Family

## 2015-12-12 NOTE — Telephone Encounter (Signed)
Called and verified pt address. She wants encounter notes (AVS) from yesterday - will print and send to patient home.

## 2015-12-12 NOTE — Telephone Encounter (Signed)
Pt would like to have her Coumadin results from yesterday mailed to her house please.

## 2015-12-13 NOTE — Telephone Encounter (Signed)
There is probably nothing wrong with your printer, the note would be a progress note because pt is on Eliquis.  Issue already taken care of.

## 2015-12-21 ENCOUNTER — Ambulatory Visit (HOSPITAL_COMMUNITY)
Admission: RE | Admit: 2015-12-21 | Discharge: 2015-12-21 | Disposition: A | Discharge status: Home / Self Care | Payer: Commercial Managed Care - HMO | Source: Ambulatory Visit | Attending: Family | Admitting: Family

## 2015-12-21 ENCOUNTER — Ambulatory Visit (INDEPENDENT_AMBULATORY_CARE_PROVIDER_SITE_OTHER): Payer: Commercial Managed Care - HMO | Admitting: Family

## 2015-12-21 ENCOUNTER — Encounter: Payer: Self-pay | Admitting: Family

## 2015-12-21 VITALS — BP 166/84 | HR 80 | Ht 59.0 in | Wt 94.0 lb

## 2015-12-21 DIAGNOSIS — E785 Hyperlipidemia, unspecified: Secondary | ICD-10-CM | POA: Diagnosis not present

## 2015-12-21 DIAGNOSIS — I10 Essential (primary) hypertension: Secondary | ICD-10-CM | POA: Diagnosis not present

## 2015-12-21 DIAGNOSIS — R938 Abnormal findings on diagnostic imaging of other specified body structures: Secondary | ICD-10-CM | POA: Diagnosis not present

## 2015-12-21 DIAGNOSIS — E1151 Type 2 diabetes mellitus with diabetic peripheral angiopathy without gangrene: Secondary | ICD-10-CM | POA: Insufficient documentation

## 2015-12-21 DIAGNOSIS — Z9889 Other specified postprocedural states: Secondary | ICD-10-CM | POA: Insufficient documentation

## 2015-12-21 DIAGNOSIS — Z4889 Encounter for other specified surgical aftercare: Secondary | ICD-10-CM | POA: Diagnosis not present

## 2015-12-21 DIAGNOSIS — I779 Disorder of arteries and arterioles, unspecified: Secondary | ICD-10-CM | POA: Diagnosis not present

## 2015-12-21 DIAGNOSIS — T82898S Other specified complication of vascular prosthetic devices, implants and grafts, sequela: Secondary | ICD-10-CM

## 2015-12-21 DIAGNOSIS — Z48812 Encounter for surgical aftercare following surgery on the circulatory system: Secondary | ICD-10-CM

## 2015-12-21 DIAGNOSIS — T82392S Other mechanical complication of femoral arterial graft (bypass), sequela: Secondary | ICD-10-CM

## 2015-12-21 DIAGNOSIS — R0989 Other specified symptoms and signs involving the circulatory and respiratory systems: Secondary | ICD-10-CM | POA: Diagnosis present

## 2015-12-21 NOTE — Patient Instructions (Signed)
Peripheral Vascular Disease Peripheral vascular disease (PVD) is a disease of the blood vessels that are not part of your heart and brain. A simple term for PVD is poor circulation. In most cases, PVD narrows the blood vessels that carry blood from your heart to the rest of your body. This can result in a decreased supply of blood to your arms, legs, and internal organs, like your stomach or kidneys. However, it most often affects a person's lower legs and feet. There are two types of PVD.  Organic PVD. This is the more common type. It is caused by damage to the structure of blood vessels.  Functional PVD. This is caused by conditions that make blood vessels contract and tighten (spasm). Without treatment, PVD tends to get worse over time. PVD can also lead to acute ischemic limb. This is when an arm or limb suddenly has trouble getting enough blood. This is a medical emergency. CAUSES Each type of PVD has many different causes. The most common cause of PVD is buildup of a fatty material (plaque) inside of your arteries (atherosclerosis). Small amounts of plaque can break off from the walls of the blood vessels and become lodged in a smaller artery. This blocks blood flow and can cause acute ischemic limb. Other common causes of PVD include:  Blood clots that form inside of blood vessels.  Injuries to blood vessels.  Diseases that cause inflammation of blood vessels or cause blood vessel spasms.  Health behaviors and health history that increase your risk of developing PVD. RISK FACTORS  You may have a greater risk of PVD if you:  Have a family history of PVD.  Have certain medical conditions, including:  High cholesterol.  Diabetes.  High blood pressure (hypertension).  Coronary heart disease.  Past problems with blood clots.  Past injury, such as burns or a broken bone. These may have damaged blood vessels in your limbs.  Buerger disease. This is caused by inflamed blood  vessels in your hands and feet.  Some forms of arthritis.  Rare birth defects that affect the arteries in your legs.  Use tobacco.  Do not get enough exercise.  Are obese.  Are age 50 or older. SIGNS AND SYMPTOMS  PVD may cause many different symptoms. Your symptoms depend on what part of your body is not getting enough blood. Some common signs and symptoms include:  Cramps in your lower legs. This may be a symptom of poor leg circulation (claudication).  Pain and weakness in your legs while you are physically active that goes away when you rest (intermittent claudication).  Leg pain when at rest.  Leg numbness, tingling, or weakness.  Coldness in a leg or foot, especially when compared with the other leg.  Skin or hair changes. These can include:  Hair loss.  Shiny skin.  Pale or bluish skin.  Thick toenails.  Inability to get or maintain an erection (erectile dysfunction). People with PVD are more prone to developing ulcers and sores on their toes, feet, or legs. These may take longer than normal to heal. DIAGNOSIS Your health care provider may diagnose PVD from your signs and symptoms. The health care provider will also do a physical exam. You may have tests to find out what is causing your PVD and determine its severity. Tests may include:  Blood pressure recordings from your arms and legs and measurements of the strength of your pulses (pulse volume recordings).  Imaging studies using sound waves to take pictures of   the blood flow through your blood vessels (Doppler ultrasound).  Injecting a dye into your blood vessels before having imaging studies using:  X-rays (angiogram or arteriogram).  Computer-generated X-rays (CT angiogram).  A powerful electromagnetic field and a computer (magnetic resonance angiogram or MRA). TREATMENT Treatment for PVD depends on the cause of your condition and the severity of your symptoms. It also depends on your age. Underlying  causes need to be treated and controlled. These include long-lasting (chronic) conditions, such as diabetes, high cholesterol, and high blood pressure. You may need to first try making lifestyle changes and taking medicines. Surgery may be needed if these do not work. Lifestyle changes may include:  Quitting smoking.  Exercising regularly.  Following a low-fat, low-cholesterol diet. Medicines may include:  Blood thinners to prevent blood clots.  Medicines to improve blood flow.  Medicines to improve your blood cholesterol levels. Surgical procedures may include:  A procedure that uses an inflated balloon to open a blocked artery and improve blood flow (angioplasty).  A procedure to put in a tube (stent) to keep a blocked artery open (stent implant).  Surgery to reroute blood flow around a blocked artery (peripheral bypass surgery).  Surgery to remove dead tissue from an infected wound on the affected limb.  Amputation. This is surgical removal of the affected limb. This may be necessary in cases of acute ischemic limb that are not improved through medical or surgical treatments. HOME CARE INSTRUCTIONS  Take medicines only as directed by your health care provider.  Do not use any tobacco products, including cigarettes, chewing tobacco, or electronic cigarettes. If you need help quitting, ask your health care provider.  Lose weight if you are overweight, and maintain a healthy weight as directed by your health care provider.  Eat a diet that is low in fat and cholesterol. If you need help, ask your health care provider.  Exercise regularly. Ask your health care provider to suggest some good activities for you.  Use compression stockings or other mechanical devices as directed by your health care provider.  Take good care of your feet.  Wear comfortable shoes that fit well.  Check your feet often for any cuts or sores. SEEK MEDICAL CARE IF:  You have cramps in your legs  while walking.  You have leg pain when you are at rest.  You have coldness in a leg or foot.  Your skin changes.  You have erectile dysfunction.  You have cuts or sores on your feet that are not healing. SEEK IMMEDIATE MEDICAL CARE IF:  Your arm or leg turns cold and blue.  Your arms or legs become red, warm, swollen, painful, or numb.  You have chest pain or trouble breathing.  You suddenly have weakness in your face, arm, or leg.  You become very confused or lose the ability to speak.  You suddenly have a very bad headache or lose your vision.   This information is not intended to replace advice given to you by your health care provider. Make sure you discuss any questions you have with your health care provider.   Document Released: 05/09/2004 Document Revised: 04/22/2014 Document Reviewed: 09/09/2013 Elsevier Interactive Patient Education 2016 Elsevier Inc.  

## 2015-12-21 NOTE — Progress Notes (Signed)
[facility] [place]   CC: Follow up peripheral artery occlusive disease  History of Present Illness [name] is a 82 y.o. female patient of Dr. Darrick Penna who is s/p left above knee to below knee popliteal artery bypass graft on 07/23/10, and her bypass has been occluded since 2012.  She denies claudication symptoms with walking, denies non healing wounds. She had a stroke in 1999 as manifested by expressive and possibly receptive aphasia, denies hemiparesis, denies monocular loss of vision, denies any stroke or TIA activity since then; noted history of atrial fib. She is right hand dominant.  She walks for an hour continuously in her house daily. She also does push ups, stationary bike, and other exercises daily. She states her appetite is good.  Pt Diabetic: Yes, last A1C was 8.0 (November 2015, review of records), uncontrolled, pt states A1C remains 8.0 Pt smoker: non-smoker  Pt meds include: Statin :Yes Betablocker: Yes ASA: No Other anticoagulants/antiplatelets: Eliquis for atrial fib   Past Medical History:  Diagnosis Date  . Atrial fibrillation (HCC)   . Diabetes mellitus   . DVT (deep venous thrombosis) (HCC)   . Hyperlipidemia   . Hypertension   . Leg pain   . Peripheral arterial disease (HCC)   . Stroke [facility]) 1999    Social History Social History  Substance Use Topics  . Smoking status: Never Smoker  . Smokeless tobacco: Never Used  . Alcohol use No    Family History Family History  Problem Relation Age of Onset  . Hypertension Mother   . Heart attack Mother   . Diabetes Father   . Hypertension Father   . Stroke Father   . Stroke Sister   . Diabetes Sister   . Heart disease Sister     Before age 36-Aneurysm  . Dementia Sister   . Alzheimer's disease Sister   . Diabetes Brother   . Diabetes Brother   . Heart disease Brother     Before age 31  . Heart attack Brother   . Heart attack Sister   . Diabetes Sister   .  Alzheimer's disease Sister     Past Surgical History:  Procedure Laterality Date  . BACK SURGERY  1996  . CATARACT EXTRACTION  2000  . CESAREAN SECTION    . EYE SURGERY    . FEMORAL BYPASS Left July 23, 2010  . LUMBAR DISC SURGERY    . SPINE SURGERY      No Known Allergies  Current Outpatient Prescriptions  Medication Sig Dispense Refill  . amLODipine (NORVASC) 10 MG tablet TAKE 1 TABLET EVERY DAY 30 tablet 10  . apixaban (ELIQUIS) 2.5 MG TABS tablet Take 1 tablet (2.5 mg total) by mouth 2 (two) times daily. 60 tablet 11  . atorvastatin (LIPITOR) 40 MG tablet TAKE 1 TABLET BY MOUTH ONCE A DAY 30 tablet 7  . carvedilol (COREG) 6.25 MG tablet Take 6.25 mg by mouth 2 (two) times daily with a meal.      . fish oil-omega-3 fatty acids 1000 MG capsule Take 1 g by mouth daily.      Marland Kitchen glipiZIDE (GLUCOTROL) 10 MG tablet Take 10 mg by mouth 2 (two) times daily before a meal.      . Lancet Devices (TRUEDRAW LANCING DEVICE) MISC     . latanoprost (XALATAN) 0.005 % ophthalmic solution Place 1 drop into both eyes at bedtime.    Marland Kitchen linagliptin (TRADJENTA) 5 MG TABS tablet Take 5 mg  by mouth daily.    Marland Kitchen lisinopril (PRINIVIL,ZESTRIL) 10 MG tablet TAKE 1 TABLET DAILY 30 tablet 0  . metFORMIN (GLUCOPHAGE) 500 MG tablet Take 500 mg by mouth 2 (two) times daily with a meal.      . Multiple Vitamins-Minerals (CENTRUM SILVER PO) Take 1 tablet by mouth 1 dose over 24 hours.      . TRUETEST TEST test strip      No current facility-administered medications for this visit.     ROS: See HPI for pertinent positives and negatives.   Physical Examination  Vitals:   12/21/15 1335 12/21/15 1345  BP: (!) 162/84 (!) 166/84  Pulse: 80   SpO2: 100%   Weight: 94 lb (42.6 kg)   Height: 4\' 11"  (1.499 m)    Body mass index is 18.99 kg/m.  General: A&O x 3, WDWN, thin female. Gait: normal Eyes: PERRLA. Pulmonary: Respirations are non labored, CTAB, good air movement in all fields. Cardiac: irregular  rhythm.     Carotid Bruits Right Left   Negative Negative  Aorta is slightly palpable. Radial pulses: are 2+ palpable and =   VASCULAR EXAM: Extremities without ischemic changes  without Gangrene; without open wounds.      LE Pulses Right Left   FEMORAL 2+ palpable 2+ palpable    POPLITEAL not palpable  not palpable   POSTERIOR TIBIAL Not palpable  not palpable    DORSALIS PEDIS  ANTERIOR TIBIAL 2+ palpable  Not palpable    Abdomen: soft, NT, no palpable masses. Skin: no rashes, no ulcers. Musculoskeletal: no muscle wasting or atrophy. Neurologic: A&O X 3; Appropriate Affect, MOTOR FUNCTION: moving all extremities equally, motor strength 4/5 throughout. Speech is fluent/normal. CN 2-12 intact     ASSESSMENT: [name] is a 82 y.o. female who is s/p left above knee to below knee popliteal artery bypass graft on 07/23/10, and her bypass has been occluded since 2012. She walks and exercises a great deal daily, has no claudication symptoms, has no non healing wounds.  Her right DP pulse is 2+ palpable.  DATA ABI's indicate non compressible vessels in both ankles, except left DP is absent. Waveforms on the right are triphasic, right TBI is 0.51, toe pressure is 76. Waveforms in the left are monophasic PT with absent DP. Left TBI is 0.28 with toe pressure of 41.  Her atherosclerotic risk factors include atrial fibrillation and uncontrolled diabetes. Fortunately she has never used tobacco. She takes a statin and Eliquis.   She sees her PCP at the end of this month, states her blood pressure at home yesterday was 128 systolic, is presently elevated.    PLAN:  Based on the patient's vascular studies and  examination, pt will return to clinic in 1 year with ABI's. She knows to notify us if she develops concerns re the circulation in her feet/legs.   Continue graduated walking program as discussed.   I discussed in depth with the patient the nature of atherosclerosis, and emphasized the importance of maximal medical management including strict control of blood pressure, blood glucose, and lipid levels, obtaining regular exercise, and continued cessation of smoking.  The patient is aware that without maximal medical management the underlying atherosclerotic disease process will progress, limiting the benefit of any interventions.  The patient was given information about PAD including signs, symptoms, treatment, what symptoms should prompt the patient to seek immediate medical care, and risk reduction measures to take.  Rosalita Chessman Nickel, RN, MSN, FNP-C [facility] [place] Office Phone: [phone]  Clinic MD: [facility]  12/21/15 1:50 PM

## 2015-12-22 DIAGNOSIS — I739 Peripheral vascular disease, unspecified: Secondary | ICD-10-CM | POA: Diagnosis not present

## 2015-12-22 DIAGNOSIS — L603 Nail dystrophy: Secondary | ICD-10-CM | POA: Diagnosis not present

## 2015-12-22 DIAGNOSIS — E1151 Type 2 diabetes mellitus with diabetic peripheral angiopathy without gangrene: Secondary | ICD-10-CM | POA: Diagnosis not present

## 2015-12-29 DIAGNOSIS — Z803 Family history of malignant neoplasm of breast: Secondary | ICD-10-CM | POA: Diagnosis not present

## 2015-12-29 DIAGNOSIS — Z1231 Encounter for screening mammogram for malignant neoplasm of breast: Secondary | ICD-10-CM | POA: Diagnosis not present

## 2016-01-04 DIAGNOSIS — H40012 Open angle with borderline findings, low risk, left eye: Secondary | ICD-10-CM | POA: Diagnosis not present

## 2016-01-04 DIAGNOSIS — H401211 Low-tension glaucoma, right eye, mild stage: Secondary | ICD-10-CM | POA: Diagnosis not present

## 2016-01-09 DIAGNOSIS — Z79899 Other long term (current) drug therapy: Secondary | ICD-10-CM | POA: Diagnosis not present

## 2016-01-09 DIAGNOSIS — I10 Essential (primary) hypertension: Secondary | ICD-10-CM | POA: Diagnosis not present

## 2016-01-09 DIAGNOSIS — I6789 Other cerebrovascular disease: Secondary | ICD-10-CM | POA: Diagnosis not present

## 2016-01-09 DIAGNOSIS — E089 Diabetes mellitus due to underlying condition without complications: Secondary | ICD-10-CM | POA: Diagnosis not present

## 2016-01-09 DIAGNOSIS — Z23 Encounter for immunization: Secondary | ICD-10-CM | POA: Diagnosis not present

## 2016-01-09 DIAGNOSIS — E782 Mixed hyperlipidemia: Secondary | ICD-10-CM | POA: Diagnosis not present

## 2016-01-09 DIAGNOSIS — I739 Peripheral vascular disease, unspecified: Secondary | ICD-10-CM | POA: Diagnosis not present

## 2016-01-22 ENCOUNTER — Other Ambulatory Visit: Payer: Self-pay | Admitting: Cardiology

## 2016-01-23 ENCOUNTER — Other Ambulatory Visit: Payer: Self-pay

## 2016-01-23 NOTE — Patient Outreach (Signed)
[facility]) Care Management  01/23/2016  [name] May 25, 1932 [id number]  REFERRAL DATE: 01/22/16 REFERRAL SOURCE:  Nurse call center REFERRAL REASON: Follow up regarding nurse call center outreach/ insect bites  Telephone call to patient regarding nurse call center referral. Unable to reach patient. HIPAA compliant vice message left with call back phone number.   Plan;RNCM will attempt 2nd telephone call to patient within 1 week.   George Ina RN,BSN,CCM [facility] Telephonic  [phone]

## 2016-01-24 ENCOUNTER — Other Ambulatory Visit: Payer: Commercial Managed Care - HMO

## 2016-01-24 NOTE — Patient Outreach (Signed)
[facility]) Care Management  01/24/2016  [name] Sep 08, 1932 [id number]   REFERRAL DATE: 01/22/16 REFERRAL SOURCE:  Nurse call center REFERRAL REASON: Follow up regarding nurse call center outreach/ insect bites  Second telephone call to patient regarding nurse call center referral. Unable to reach patient. HIPAA compliant voice message left with call back phone number.   PLAN; RNCM will attempt 3rd telephone outreach to patient within 1 week.  George Ina RN,BSN,CCM [facility] Telephonic  [phone]

## 2016-01-25 ENCOUNTER — Other Ambulatory Visit: Payer: Self-pay

## 2016-01-25 NOTE — Patient Outreach (Signed)
[facility]) Care Management  01/25/2016  [name] October 05, 1932 [id number]  REFERRAL DATE: 01/22/16 REFERRAL SOURCE: Nurse call center REFERRAL REASON: Follow up regarding nurse call center outreach/ insect bites  Third telephone call to patient regarding nurse call center referral.  Unable to reach patient. HIPAA compliant voice message left with call back phone number.  PLAN:  RNCM will send letter outreach letter to attempt contact.   George Ina RN,BSN,CCM [facility] Telephonic  [phone]

## 2016-02-08 ENCOUNTER — Other Ambulatory Visit: Payer: Self-pay

## 2016-02-08 NOTE — Patient Outreach (Signed)
[facility]) Care Management  02/08/2016  [name] [birth year] [id number]   No response from patient to telephone and letter outreach.  PLAN; RNCM will refer patient to case management assistant to close due to inability to contact patient.  RNCM will notify patients primary MD of closure.    George Ina RN,BSN,CCM [facility] Telephonic  [phone]

## 2016-02-21 DIAGNOSIS — R6889 Other general symptoms and signs: Secondary | ICD-10-CM | POA: Diagnosis not present

## 2016-03-12 DIAGNOSIS — I739 Peripheral vascular disease, unspecified: Secondary | ICD-10-CM | POA: Diagnosis not present

## 2016-03-12 DIAGNOSIS — E0800 Diabetes mellitus due to underlying condition with hyperosmolarity without nonketotic hyperglycemic-hyperosmolar coma (NKHHC): Secondary | ICD-10-CM | POA: Diagnosis not present

## 2016-03-12 DIAGNOSIS — I10 Essential (primary) hypertension: Secondary | ICD-10-CM | POA: Diagnosis not present

## 2016-03-18 NOTE — Progress Notes (Signed)
HPI: FU paroxysmal atrial fibrillation and prior CVA. She had a Myoview performed on Aug 27, 2005, that showed an ejection fraction of 80% and normal perfusion. Last echocardiogram in July of 2009 showed normal LV function and mild left atrial enlargement. Patient followed by vascular surgery for peripheral vascular disease. Since I last saw her, the patient denies any dyspnea on exertion, orthopnea, PND, pedal edema, palpitations, syncope or chest pain.   Current Outpatient Prescriptions  Medication Sig Dispense Refill  . amLODipine (NORVASC) 10 MG tablet TAKE 1 TABLET EVERY DAY 30 tablet 10  . atorvastatin (LIPITOR) 40 MG tablet TAKE 1 TABLET BY MOUTH ONCE A DAY 30 tablet 7  . carvedilol (COREG) 6.25 MG tablet Take 6.25 mg by mouth 2 (two) times daily with a meal.      . ELIQUIS 2.5 MG TABS tablet TAKE 1 TABLET TWICE DAILY 180 tablet 1  . fish oil-omega-3 fatty acids 1000 MG capsule Take 1 g by mouth daily.      Marland Kitchen glipiZIDE (GLUCOTROL) 10 MG tablet Take 10 mg by mouth 2 (two) times daily before a meal.      . Lancet Devices (TRUEDRAW LANCING DEVICE) MISC     . latanoprost (XALATAN) 0.005 % ophthalmic solution Place 1 drop into both eyes at bedtime.    Marland Kitchen linagliptin (TRADJENTA) 5 MG TABS tablet Take 5 mg by mouth daily.    Marland Kitchen lisinopril (PRINIVIL,ZESTRIL) 10 MG tablet TAKE 1 TABLET DAILY 30 tablet 0  . metFORMIN (GLUCOPHAGE) 500 MG tablet Take 500 mg by mouth 2 (two) times daily with a meal.      . Multiple Vitamins-Minerals (CENTRUM SILVER PO) Take 1 tablet by mouth 1 dose over 24 hours.      . TRUETEST TEST test strip      No current facility-administered medications for this visit.      Past Medical History:  Diagnosis Date  . Atrial fibrillation (HCC)   . Diabetes mellitus   . DVT (deep venous thrombosis) (HCC)   . Hyperlipidemia   . Hypertension   . Leg pain   . Peripheral arterial disease (HCC)   . Stroke [facility]) 1999    Past Surgical History:  Procedure Laterality  Date  . BACK SURGERY  1996  . CATARACT EXTRACTION  2000  . CESAREAN SECTION    . EYE SURGERY    . FEMORAL BYPASS Left July 23, 2010  . LUMBAR DISC SURGERY    . SPINE SURGERY      Social History   Social History  . Marital status: Widowed    Spouse name: N/A  . Number of children: N/A  . Years of education: N/A   Occupational History  . Not on file.   Social History Main Topics  . Smoking status: Never Smoker  . Smokeless tobacco: Never Used  . Alcohol use No  . Drug use: No  . Sexual activity: Not on file   Other Topics Concern  . Not on file   Social History Narrative  . No narrative on file    Family History  Problem Relation Age of Onset  . Hypertension Mother   . Heart attack Mother   . Diabetes Father   . Hypertension Father   . Stroke Father   . Stroke Sister   . Diabetes Sister   . Heart disease Sister     Before age 54-Aneurysm  . Dementia Sister   . Alzheimer's disease Sister   .  Diabetes Brother   . Diabetes Brother   . Heart disease Brother     Before age 80  . Heart attack Brother   . Heart attack Sister   . Diabetes Sister   . Alzheimer's disease Sister     ROS: no fevers or chills, productive cough, hemoptysis, dysphasia, odynophagia, melena, hematochezia, dysuria, hematuria, rash, seizure activity, orthopnea, PND, pedal edema, claudication. Remaining systems are negative.  Physical Exam: Well-developed well-nourished in no acute distress.  Skin is warm and dry.  HEENT is normal.  Neck is supple.  Chest is clear to auscultation with normal expansion.  Cardiovascular exam is regular rate and rhythm.  Abdominal exam nontender or distended. No masses palpated. Extremities show no edema. neuro grossly intact  ECG-Sinus rhythm with occasional PAC. No ST changes.  A/P  1 paroxysmal atrial fibrillation-patient remains in sinus rhythm. Continue beta blocker and apixaban. Check hemoglobin.  2 hypertension-blood pressure controlled.  Continue present medications. Check potassium and renal function.  3 hyperlipidemia-continue statin. Check lipids and liver.  4 peripheral vascular disease-continue statin. No aspirin given need for apixaban. Followed by vascular surgery.   Olga MillersBrian Crenshaw, MD

## 2016-03-20 DIAGNOSIS — E1151 Type 2 diabetes mellitus with diabetic peripheral angiopathy without gangrene: Secondary | ICD-10-CM | POA: Diagnosis not present

## 2016-03-20 DIAGNOSIS — L603 Nail dystrophy: Secondary | ICD-10-CM | POA: Diagnosis not present

## 2016-03-20 DIAGNOSIS — R6889 Other general symptoms and signs: Secondary | ICD-10-CM | POA: Diagnosis not present

## 2016-03-20 DIAGNOSIS — I739 Peripheral vascular disease, unspecified: Secondary | ICD-10-CM | POA: Diagnosis not present

## 2016-03-21 ENCOUNTER — Ambulatory Visit (INDEPENDENT_AMBULATORY_CARE_PROVIDER_SITE_OTHER): Payer: Commercial Managed Care - HMO | Admitting: Cardiology

## 2016-03-21 ENCOUNTER — Encounter: Payer: Self-pay | Admitting: Cardiology

## 2016-03-21 VITALS — BP 126/60 | HR 83 | Ht 59.0 in | Wt 96.0 lb

## 2016-03-21 DIAGNOSIS — E7800 Pure hypercholesterolemia, unspecified: Secondary | ICD-10-CM

## 2016-03-21 DIAGNOSIS — I4891 Unspecified atrial fibrillation: Secondary | ICD-10-CM | POA: Diagnosis not present

## 2016-03-21 DIAGNOSIS — R6889 Other general symptoms and signs: Secondary | ICD-10-CM | POA: Diagnosis not present

## 2016-03-21 DIAGNOSIS — I10 Essential (primary) hypertension: Secondary | ICD-10-CM

## 2016-03-21 LAB — HEPATIC FUNCTION PANEL
ALK PHOS: 67 U/L (ref 33–130)
ALT: 27 U/L (ref 6–29)
AST: 26 U/L (ref 10–35)
Albumin: 4.7 g/dL (ref 3.6–5.1)
BILIRUBIN INDIRECT: 0.3 mg/dL (ref 0.2–1.2)
Bilirubin, Direct: 0.1 mg/dL (ref <=0.2)
TOTAL PROTEIN: 7.7 g/dL (ref 6.1–8.1)
Total Bilirubin: 0.4 mg/dL (ref 0.2–1.2)

## 2016-03-21 LAB — LIPID PANEL
CHOLESTEROL: 117 mg/dL (ref <200)
HDL: 44 mg/dL — ABNORMAL LOW (ref >50)
LDL CALC: 60 mg/dL (ref <100)
TRIGLYCERIDES: 65 mg/dL (ref <150)
Total CHOL/HDL Ratio: 2.7 Ratio (ref <5.0)
VLDL: 13 mg/dL (ref <30)

## 2016-03-21 LAB — BASIC METABOLIC PANEL
BUN: 15 mg/dL (ref 7–25)
CALCIUM: 9.8 mg/dL (ref 8.6–10.4)
CHLORIDE: 103 mmol/L (ref 98–110)
CO2: 29 mmol/L (ref 20–31)
CREATININE: 0.79 mg/dL (ref 0.60–0.88)
Glucose, Bld: 150 mg/dL — ABNORMAL HIGH (ref 65–99)
POTASSIUM: 4.4 mmol/L (ref 3.5–5.3)
SODIUM: 142 mmol/L (ref 135–146)

## 2016-03-21 LAB — CBC
HCT: 38.7 % (ref 35.0–45.0)
HEMOGLOBIN: 12.6 g/dL (ref 11.7–15.5)
MCH: 33.2 pg — ABNORMAL HIGH (ref 27.0–33.0)
MCHC: 32.6 g/dL (ref 32.0–36.0)
MCV: 101.8 fL — ABNORMAL HIGH (ref 80.0–100.0)
MPV: 11 fL (ref 7.5–12.5)
PLATELETS: 211 10*3/uL (ref 140–400)
RBC: 3.8 MIL/uL (ref 3.80–5.10)
RDW: 13.3 % (ref 11.0–15.0)
WBC: 6.1 10*3/uL (ref 3.8–10.8)

## 2016-03-21 NOTE — Patient Instructions (Signed)
Medication Instructions:   NO CHANGE  Labwork:  Your physician recommends that you HAVE LAB WORK TODAY  Follow-Up:  Your physician wants you to follow-up in: ONE YEAR WITH DR CRENSHAW You will receive a reminder letter in the mail two months in advance. If you don't receive a letter, please call our office to schedule the follow-up appointment.   If you need a refill on your cardiac medications before your next appointment, please call your pharmacy.    

## 2016-03-22 ENCOUNTER — Encounter: Payer: Self-pay | Admitting: *Deleted

## 2016-03-27 ENCOUNTER — Telehealth: Payer: Self-pay | Admitting: Cardiology

## 2016-03-27 NOTE — Telephone Encounter (Signed)
SPOKE TO PATIENT REVIEWED.PAIENT WANTED TO BR SURE UNDERSTOOD.  VERBALIZED UNDERSTANDING.

## 2016-03-27 NOTE — Telephone Encounter (Signed)
Pt received lab results yesterday and would like a nurse call to explain what each of the numbers mean--pls call [phone]

## 2016-05-29 DIAGNOSIS — L603 Nail dystrophy: Secondary | ICD-10-CM | POA: Diagnosis not present

## 2016-05-29 DIAGNOSIS — L84 Corns and callosities: Secondary | ICD-10-CM | POA: Diagnosis not present

## 2016-05-29 DIAGNOSIS — I739 Peripheral vascular disease, unspecified: Secondary | ICD-10-CM | POA: Diagnosis not present

## 2016-05-29 DIAGNOSIS — R6889 Other general symptoms and signs: Secondary | ICD-10-CM | POA: Diagnosis not present

## 2016-05-29 DIAGNOSIS — E1151 Type 2 diabetes mellitus with diabetic peripheral angiopathy without gangrene: Secondary | ICD-10-CM | POA: Diagnosis not present

## 2016-06-11 ENCOUNTER — Ambulatory Visit (INDEPENDENT_AMBULATORY_CARE_PROVIDER_SITE_OTHER): Payer: Medicare HMO | Admitting: *Deleted

## 2016-06-11 DIAGNOSIS — I482 Chronic atrial fibrillation, unspecified: Secondary | ICD-10-CM

## 2016-06-11 LAB — CBC
HEMATOCRIT: 35.8 % (ref 34.0–46.6)
Hemoglobin: 11.7 g/dL (ref 11.1–15.9)
MCH: 33.1 pg — ABNORMAL HIGH (ref 26.6–33.0)
MCHC: 32.7 g/dL (ref 31.5–35.7)
MCV: 101 fL — AB (ref 79–97)
Platelets: 203 10*3/uL (ref 150–379)
RBC: 3.54 x10E6/uL — AB (ref 3.77–5.28)
RDW: 13.6 % (ref 12.3–15.4)
WBC: 5.3 10*3/uL (ref 3.4–10.8)

## 2016-06-11 LAB — BASIC METABOLIC PANEL
BUN/Creatinine Ratio: 24 (ref 12–28)
BUN: 16 mg/dL (ref 8–27)
CO2: 22 mmol/L (ref 18–29)
Calcium: 9.7 mg/dL (ref 8.7–10.3)
Chloride: 100 mmol/L (ref 96–106)
Creatinine, Ser: 0.67 mg/dL (ref 0.57–1.00)
GFR, EST AFRICAN AMERICAN: 95 mL/min/{1.73_m2} (ref >59)
GFR, EST NON AFRICAN AMERICAN: 83 mL/min/{1.73_m2} (ref >59)
Glucose: 205 mg/dL — ABNORMAL HIGH (ref 65–99)
Potassium: 4.6 mmol/L (ref 3.5–5.2)
Sodium: 142 mmol/L (ref 134–144)

## 2016-06-11 NOTE — Progress Notes (Signed)
Pt was started on Eliquis 2.5mg  every 12 hours  for atrial Fib on 04/18/2015 .    Reviewed patients medication list.  Pt is not  currently on any combined P-gp and strong CYP3A4 inhibitors/inducers (ketoconazole, traconazole, ritonavir, carbamazepine, phenytoin, rifampin, St. John's wort).  Reviewed labs.  SCr 0.67 , Weight 44.27kg .  Dose is appropriate  based on age, weight, and SCr.  Hgb and HCT are 11.7/35.8.   A full discussion of the nature of anticoagulants has been reviewed.  A benefit/risk analysis has been presented to the patient, so that they understand the justification for choosing anticoagulation with Eliquis .  The need for compliance is stressed.  Pt is aware to take the medication twice daily.  Side effects of potential bleeding are discussed, including unusual colored urine or stools, coughing up blood or coffee ground emesis, nose bleeds or serious fall or head trauma.  Discussed signs and symptoms of stroke. The patient should avoid any OTC items containing aspirin or ibuprofen.  Avoid alcohol consumption.   Call if any signs of abnormal bleeding.  Discussed financial obligations and states is having some difficulty in obtaining medication but states is manageable. Pt states has had no sign or symptom of bleeding or sign or symptom of stroke and has not missed any doses of Eliquis CBC and BMET will be done and will call with results. Pt called with results and instructed to continue Eliquis 2.5mg  BID. Follow with [place] in one year per Fletcher Anon D.

## 2016-06-20 DIAGNOSIS — H35033 Hypertensive retinopathy, bilateral: Secondary | ICD-10-CM | POA: Diagnosis not present

## 2016-06-20 DIAGNOSIS — H401211 Low-tension glaucoma, right eye, mild stage: Secondary | ICD-10-CM | POA: Diagnosis not present

## 2016-06-20 DIAGNOSIS — E119 Type 2 diabetes mellitus without complications: Secondary | ICD-10-CM | POA: Diagnosis not present

## 2016-06-20 DIAGNOSIS — H40012 Open angle with borderline findings, low risk, left eye: Secondary | ICD-10-CM | POA: Diagnosis not present

## 2016-07-10 DIAGNOSIS — Z681 Body mass index (BMI) 19 or less, adult: Secondary | ICD-10-CM | POA: Diagnosis not present

## 2016-07-10 DIAGNOSIS — E089 Diabetes mellitus due to underlying condition without complications: Secondary | ICD-10-CM | POA: Diagnosis not present

## 2016-07-10 DIAGNOSIS — E782 Mixed hyperlipidemia: Secondary | ICD-10-CM | POA: Diagnosis not present

## 2016-07-10 DIAGNOSIS — I739 Peripheral vascular disease, unspecified: Secondary | ICD-10-CM | POA: Diagnosis not present

## 2016-07-10 DIAGNOSIS — I10 Essential (primary) hypertension: Secondary | ICD-10-CM | POA: Diagnosis not present

## 2016-08-08 ENCOUNTER — Other Ambulatory Visit: Payer: Self-pay | Admitting: Pharmacist

## 2016-08-08 ENCOUNTER — Telehealth: Payer: Self-pay | Admitting: Cardiology

## 2016-08-08 MED ORDER — APIXABAN 2.5 MG PO TABS: 2.5000 mg | ORAL_TABLET | Freq: Two times a day (BID) | ORAL | 1 refills | Status: DC

## 2016-08-08 MED ORDER — APIXABAN 2.5 MG PO TABS: 2.5000 mg | ORAL_TABLET | Freq: Two times a day (BID) | ORAL | 0 refills | Status: DC

## 2016-08-08 NOTE — Telephone Encounter (Signed)
Rx for 14 tablets already sent to by Dr Jens Som [facility] on 08/08/16

## 2016-08-08 NOTE — Telephone Encounter (Signed)
New message       *STAT* If patient is at the pharmacy, call can be transferred to refill team.   1. Which medications need to be refilled? (please list name of each medication and dose if known) eliquis 2.5mg   2. Which pharmacy/location (including street and city if local pharmacy) is medication to be sent to? [facility] [address]. Do they need a 30 day or 90 day supply?  Pt has 1 pill left.  Need enough to last until mail order presc comes.

## 2016-08-16 DIAGNOSIS — E1151 Type 2 diabetes mellitus with diabetic peripheral angiopathy without gangrene: Secondary | ICD-10-CM | POA: Diagnosis not present

## 2016-08-16 DIAGNOSIS — I739 Peripheral vascular disease, unspecified: Secondary | ICD-10-CM | POA: Diagnosis not present

## 2016-08-16 DIAGNOSIS — R6889 Other general symptoms and signs: Secondary | ICD-10-CM | POA: Diagnosis not present

## 2016-08-16 DIAGNOSIS — L84 Corns and callosities: Secondary | ICD-10-CM | POA: Diagnosis not present

## 2016-08-16 DIAGNOSIS — L603 Nail dystrophy: Secondary | ICD-10-CM | POA: Diagnosis not present

## 2016-11-08 DIAGNOSIS — I10 Essential (primary) hypertension: Secondary | ICD-10-CM | POA: Diagnosis not present

## 2016-11-08 DIAGNOSIS — E0811 Diabetes mellitus due to underlying condition with ketoacidosis with coma: Secondary | ICD-10-CM | POA: Diagnosis not present

## 2016-11-08 DIAGNOSIS — I739 Peripheral vascular disease, unspecified: Secondary | ICD-10-CM | POA: Diagnosis not present

## 2016-11-08 DIAGNOSIS — E782 Mixed hyperlipidemia: Secondary | ICD-10-CM | POA: Diagnosis not present

## 2016-11-15 DIAGNOSIS — I739 Peripheral vascular disease, unspecified: Secondary | ICD-10-CM | POA: Diagnosis not present

## 2016-11-15 DIAGNOSIS — L603 Nail dystrophy: Secondary | ICD-10-CM | POA: Diagnosis not present

## 2016-11-15 DIAGNOSIS — E1151 Type 2 diabetes mellitus with diabetic peripheral angiopathy without gangrene: Secondary | ICD-10-CM | POA: Diagnosis not present

## 2016-11-15 DIAGNOSIS — L84 Corns and callosities: Secondary | ICD-10-CM | POA: Diagnosis not present

## 2016-11-15 DIAGNOSIS — R6889 Other general symptoms and signs: Secondary | ICD-10-CM | POA: Diagnosis not present

## 2016-12-24 ENCOUNTER — Encounter: Payer: Self-pay | Admitting: Family

## 2016-12-26 ENCOUNTER — Encounter (HOSPITAL_COMMUNITY): Payer: Commercial Managed Care - HMO

## 2016-12-26 ENCOUNTER — Ambulatory Visit: Payer: Medicare HMO | Admitting: Family

## 2017-01-02 DIAGNOSIS — R6889 Other general symptoms and signs: Secondary | ICD-10-CM | POA: Diagnosis not present

## 2017-01-02 DIAGNOSIS — H401211 Low-tension glaucoma, right eye, mild stage: Secondary | ICD-10-CM | POA: Diagnosis not present

## 2017-01-02 DIAGNOSIS — H40012 Open angle with borderline findings, low risk, left eye: Secondary | ICD-10-CM | POA: Diagnosis not present

## 2017-01-22 DIAGNOSIS — Z1231 Encounter for screening mammogram for malignant neoplasm of breast: Secondary | ICD-10-CM | POA: Diagnosis not present

## 2017-02-13 ENCOUNTER — Ambulatory Visit (INDEPENDENT_AMBULATORY_CARE_PROVIDER_SITE_OTHER): Payer: Medicare HMO | Admitting: Family

## 2017-02-13 ENCOUNTER — Ambulatory Visit (HOSPITAL_COMMUNITY)
Admission: RE | Admit: 2017-02-13 | Discharge: 2017-02-13 | Disposition: A | Discharge status: Home / Self Care | Payer: Medicare HMO | Source: Ambulatory Visit | Attending: Family | Admitting: Family

## 2017-02-13 ENCOUNTER — Encounter: Payer: Self-pay | Admitting: Family

## 2017-02-13 VITALS — BP 156/79 | HR 87 | Temp 97.3°F | Resp 18 | Ht 59.0 in | Wt 93.8 lb

## 2017-02-13 DIAGNOSIS — T82898S Other specified complication of vascular prosthetic devices, implants and grafts, sequela: Secondary | ICD-10-CM

## 2017-02-13 DIAGNOSIS — E1151 Type 2 diabetes mellitus with diabetic peripheral angiopathy without gangrene: Secondary | ICD-10-CM

## 2017-02-13 DIAGNOSIS — E119 Type 2 diabetes mellitus without complications: Secondary | ICD-10-CM | POA: Insufficient documentation

## 2017-02-13 DIAGNOSIS — I10 Essential (primary) hypertension: Secondary | ICD-10-CM | POA: Insufficient documentation

## 2017-02-13 DIAGNOSIS — Z9889 Other specified postprocedural states: Secondary | ICD-10-CM | POA: Diagnosis not present

## 2017-02-13 DIAGNOSIS — I779 Disorder of arteries and arterioles, unspecified: Secondary | ICD-10-CM

## 2017-02-13 DIAGNOSIS — R6889 Other general symptoms and signs: Secondary | ICD-10-CM | POA: Diagnosis not present

## 2017-02-13 DIAGNOSIS — R0989 Other specified symptoms and signs involving the circulatory and respiratory systems: Secondary | ICD-10-CM | POA: Diagnosis not present

## 2017-02-13 DIAGNOSIS — E785 Hyperlipidemia, unspecified: Secondary | ICD-10-CM | POA: Insufficient documentation

## 2017-02-13 NOTE — Patient Instructions (Signed)

## 2017-02-13 NOTE — Progress Notes (Signed)
[facility] [place]   CC: Follow up peripheral artery occlusive disease  History of Present Illness [name] is a 83 y.o. female patient of Dr. Darrick Penna who is s/p left above knee to below knee popliteal artery bypass graft on 07/23/10, and her bypass has been occluded since 2012.  She denies claudication symptoms with walking, denies non healing wounds. She reports stiffness in left foot and ankle since 2012.  She had a stroke in 1999 as manifested by expressive and possibly receptive aphasia, denies hemiparesis, denies monocular loss of vision, denies any stroke or TIA activity since then; noted history of atrial fib. She is right hand dominant.  She walks for 2 hours continuously in her house daily. She also does push ups, and other exercises daily. She states her appetite is good.  Pt Diabetic: Yes, last A1C was 8.0 (November 2015, review of records), uncontrolled, pt states about the same Pt smoker: non-smoker  Pt meds include: Statin :Yes Betablocker: Yes ASA: No Other anticoagulants/antiplatelets: Eliquis for atrial fib   Past Medical History:  Diagnosis Date  . Atrial fibrillation (HCC)   . Diabetes mellitus   . DVT (deep venous thrombosis) (HCC)   . Hyperlipidemia   . Hypertension   . Leg pain   . Peripheral arterial disease (HCC)   . Stroke [facility]) 1999    Social History Social History  Substance Use Topics  . Smoking status: Never Smoker  . Smokeless tobacco: Never Used  . Alcohol use No    Family History Family History  Problem Relation Age of Onset  . Hypertension Mother   . Heart attack Mother   . Diabetes Father   . Hypertension Father   . Stroke Father   . Stroke Sister   . Diabetes Sister   . Heart disease Sister        Before age 79-Aneurysm  . Dementia Sister   . Alzheimer's disease Sister   . Diabetes Brother   . Diabetes Brother   . Heart disease Brother        Before age 6  . Heart attack Brother   .  Heart attack Sister   . Diabetes Sister   . Alzheimer's disease Sister     Past Surgical History:  Procedure Laterality Date  . BACK SURGERY  1996  . CATARACT EXTRACTION  2000  . CESAREAN SECTION    . EYE SURGERY    . FEMORAL BYPASS Left July 23, 2010  . LUMBAR DISC SURGERY    . SPINE SURGERY      No Known Allergies  Current Outpatient Prescriptions  Medication Sig Dispense Refill  . amLODipine (NORVASC) 10 MG tablet TAKE 1 TABLET EVERY DAY 30 tablet 10  . apixaban (ELIQUIS) 2.5 MG TABS tablet Take 1 tablet (2.5 mg total) by mouth 2 (two) times daily. 180 tablet 1  . apixaban (ELIQUIS) 2.5 MG TABS tablet Take 1 tablet (2.5 mg total) by mouth 2 (two) times daily. 14 tablet 0  . atorvastatin (LIPITOR) 40 MG tablet TAKE 1 TABLET BY MOUTH ONCE A DAY 30 tablet 7  . carvedilol (COREG) 6.25 MG tablet Take 6.25 mg by mouth 2 (two) times daily with a meal.      . fish oil-omega-3 fatty acids 1000 MG capsule Take 1 g by mouth daily.      Marland Kitchen glipiZIDE (GLUCOTROL) 10 MG tablet Take 10 mg by mouth 2 (two) times daily before a meal.      .  Lancet Devices (TRUEDRAW LANCING DEVICE) MISC     . latanoprost (XALATAN) 0.005 % ophthalmic solution Place 1 drop into both eyes at bedtime.    Marland Kitchen. linagliptin (TRADJENTA) 5 MG TABS tablet Take 5 mg by mouth daily.    Marland Kitchen. lisinopril (PRINIVIL,ZESTRIL) 10 MG tablet TAKE 1 TABLET DAILY 30 tablet 0  . metFORMIN (GLUCOPHAGE) 500 MG tablet Take 500 mg by mouth 2 (two) times daily with a meal.      . Multiple Vitamins-Minerals (CENTRUM SILVER PO) Take 1 tablet by mouth 1 dose over 24 hours.      . TRUETEST TEST test strip      No current facility-administered medications for this visit.     ROS: See HPI for pertinent positives and negatives.   Physical Examination  Vitals:   02/13/17 1333 02/13/17 1336  BP: (!) 155/77 (!) 156/79  Pulse: 87   Resp: 18   Temp: (!) 97.3 F (36.3 C)   TempSrc: Oral   SpO2: 98%   Weight: 93 lb 12.8 oz (42.5 kg)   Height: 4'  11" (1.499 m)    Body mass index is 18.95 kg/m.  General: A&O x 3, WDWN, thin female. Gait: normal Eyes: PERRLA. Pulmonary: Respirations are non labored, CTAB, good air movement in all fields. Cardiac: irregular rhythm.     Carotid Bruits Right Left   Negative Negative   Aorta is slightly palpable. Radial pulses: are 2+ palpable and =   VASCULAR EXAM: Extremitieswithout ischemic changes  without Gangrene; without open wounds.      LE Pulses Right Left   FEMORAL 2+ palpable 2+ palpable    POPLITEAL not palpable  not palpable   POSTERIOR TIBIAL Not palpable  not palpable    DORSALIS PEDIS  ANTERIOR TIBIAL 2+ palpable  Not palpable    Abdomen: soft, NT, no palpable masses. Skin: no rashes, no ulcers. Musculoskeletal: no muscle wasting or atrophy. Neurologic: A&O X 3; Appropriate Affect, MOTOR FUNCTION: moving all extremities equally, motor strength 4/5 throughout. Speech is fluent/normal. CN 2-12 intact   ASSESSMENT: [name] is a 83 y.o. female who is s/p left above knee to below knee popliteal artery bypass graft on 07/23/10, and her bypass has been occluded since 2012. She walks and exercises a great deal daily, has no claudication symptoms, has no non healing wounds.  Her right DP pulse is 2+ palpable.  Her atherosclerotic risk factors include atrial fibrillation and uncontrolled diabetes. Fortunately she has never used tobacco. She takes a statin and Eliquis.    ABI (Date: 02/13/2017):  R:   ABI: [place] (was [place] on 12-21-15),   PT: tri  DP: tri  TBI:  0.59 (was 0.51)  L:   ABI: [place] (was ),   PT: mono  DP: mono  TBI: 0.29 (was 0.28)  Bilateral ABI and TBI remains stable, all  non compressible vessels, triphasic waveforms on the right, monophasic on the left.    PLAN:  Based on the patient's vascular studies and examination, pt will return to clinic in 1 year with ABI's. She knows to notify us if she develops concerns re the circulation in her feet/legs.   Continue graduated walking program as discussed.   I discussed in depth with the patient the nature of atherosclerosis, and emphasized the importance of maximal medical management including strict control of blood pressure, blood glucose, and lipid levels, obtaining regular exercise, and continued cessation of smoking.  The patient is aware that without maximal medical management  the underlying atherosclerotic disease process will progress, limiting the benefit of any interventions.  The patient was given information about PAD including signs, symptoms, treatment, what symptoms should prompt the patient to seek immediate medical care, and risk reduction measures to take.  Charisse March, RN, MSN, FNP-C [facility] [place] Phone: [phone]  Clinic MD: Darrick Penna  02/13/17 1:53 PM

## 2017-02-14 DIAGNOSIS — R6889 Other general symptoms and signs: Secondary | ICD-10-CM | POA: Diagnosis not present

## 2017-02-14 DIAGNOSIS — L603 Nail dystrophy: Secondary | ICD-10-CM | POA: Diagnosis not present

## 2017-02-14 DIAGNOSIS — I739 Peripheral vascular disease, unspecified: Secondary | ICD-10-CM | POA: Diagnosis not present

## 2017-02-14 DIAGNOSIS — E1151 Type 2 diabetes mellitus with diabetic peripheral angiopathy without gangrene: Secondary | ICD-10-CM | POA: Diagnosis not present

## 2017-02-14 DIAGNOSIS — L84 Corns and callosities: Secondary | ICD-10-CM | POA: Diagnosis not present

## 2017-02-27 NOTE — Addendum Note (Signed)
Addended by: Burton Apley A on: 02/27/2017 11:49 AM   Modules accepted: Orders

## 2017-03-11 ENCOUNTER — Other Ambulatory Visit: Payer: Self-pay | Admitting: Family Medicine

## 2017-03-11 ENCOUNTER — Ambulatory Visit
Admission: RE | Admit: 2017-03-11 | Discharge: 2017-03-11 | Disposition: A | Discharge status: Home / Self Care | Payer: Medicare HMO | Source: Ambulatory Visit | Attending: Family Medicine | Admitting: Family Medicine

## 2017-03-11 DIAGNOSIS — M130 Polyarthritis, unspecified: Secondary | ICD-10-CM | POA: Diagnosis not present

## 2017-03-11 DIAGNOSIS — E782 Mixed hyperlipidemia: Secondary | ICD-10-CM | POA: Diagnosis not present

## 2017-03-11 DIAGNOSIS — I10 Essential (primary) hypertension: Secondary | ICD-10-CM | POA: Diagnosis not present

## 2017-03-11 DIAGNOSIS — I6789 Other cerebrovascular disease: Secondary | ICD-10-CM | POA: Diagnosis not present

## 2017-03-11 DIAGNOSIS — E089 Diabetes mellitus due to underlying condition without complications: Secondary | ICD-10-CM | POA: Diagnosis not present

## 2017-03-11 DIAGNOSIS — M25512 Pain in left shoulder: Secondary | ICD-10-CM | POA: Diagnosis not present

## 2017-03-24 ENCOUNTER — Ambulatory Visit: Payer: Medicare HMO | Admitting: Physician Assistant

## 2017-03-28 ENCOUNTER — Telehealth: Payer: Self-pay | Admitting: Cardiology

## 2017-03-28 DIAGNOSIS — I480 Paroxysmal atrial fibrillation: Secondary | ICD-10-CM

## 2017-03-28 NOTE — Telephone Encounter (Signed)
Pt c/o medication issue:  1. Name of Medication: eliquis   2. How are you currently taking this medication (dosage and times per day)? 2.5mg  1 tablet 2xday  3. Are you having a reaction (difficulty breathing--STAT)?  no 4. What is your medication issue?  Lysle Dingwall is more affordable for her   Pt asked that once you have changed the medication to please call the Patient Support Number for Assistance [phone]  Pt ID number [id number]

## 2017-03-28 NOTE — Telephone Encounter (Signed)
Returned the call to the patient. The patient is currently taking Eliquis 2.5 mg and would like to switch to Pradaxa. She stated that she was informed by the assistance program that this would be cheaper for her. Will route to pharmd and the provider for their recommendation.

## 2017-03-29 NOTE — Telephone Encounter (Signed)
CrCl 43; DC apixaban and begin pradaxa 150 mg BID; bmet CBC 4 weeks Olga Millers

## 2017-03-31 MED ORDER — DABIGATRAN ETEXILATE MESYLATE 150 MG PO CAPS: 150.0000 mg | ORAL_CAPSULE | Freq: Two times a day (BID) | ORAL | 3 refills | Status: DC

## 2017-03-31 MED ORDER — DABIGATRAN ETEXILATE MESYLATE 150 MG PO CAPS: 150.0000 mg | ORAL_CAPSULE | Freq: Two times a day (BID) | ORAL | 6 refills | Status: DC

## 2017-03-31 NOTE — Telephone Encounter (Signed)
Spoke with pt, Aware of dr crenshaw's recommendations. New script sent to the pharmacy and Lab orders mailed to the pt  

## 2017-04-03 ENCOUNTER — Other Ambulatory Visit: Payer: Self-pay | Admitting: *Deleted

## 2017-04-03 DIAGNOSIS — I480 Paroxysmal atrial fibrillation: Secondary | ICD-10-CM

## 2017-04-03 MED ORDER — DABIGATRAN ETEXILATE MESYLATE 150 MG PO CAPS: 150.0000 mg | ORAL_CAPSULE | Freq: Two times a day (BID) | ORAL | 3 refills | Status: DC

## 2017-04-10 ENCOUNTER — Telehealth: Payer: Self-pay | Admitting: *Deleted

## 2017-04-10 NOTE — Telephone Encounter (Signed)
Paperwork for pradaxa mailed to [facility], [facility].

## 2017-04-17 ENCOUNTER — Ambulatory Visit: Payer: Medicare HMO | Admitting: Physician Assistant

## 2017-04-17 VITALS — BP 116/68 | HR 99 | Ht 60.0 in | Wt 95.0 lb

## 2017-04-17 DIAGNOSIS — E119 Type 2 diabetes mellitus without complications: Secondary | ICD-10-CM | POA: Diagnosis not present

## 2017-04-17 DIAGNOSIS — I739 Peripheral vascular disease, unspecified: Secondary | ICD-10-CM

## 2017-04-17 DIAGNOSIS — I10 Essential (primary) hypertension: Secondary | ICD-10-CM

## 2017-04-17 DIAGNOSIS — E785 Hyperlipidemia, unspecified: Secondary | ICD-10-CM

## 2017-04-17 DIAGNOSIS — Z8673 Personal history of transient ischemic attack (TIA), and cerebral infarction without residual deficits: Secondary | ICD-10-CM | POA: Diagnosis not present

## 2017-04-17 DIAGNOSIS — I480 Paroxysmal atrial fibrillation: Secondary | ICD-10-CM

## 2017-04-17 NOTE — Progress Notes (Signed)
Cardiology Office Note    Date:  04/19/2017   ID:  Pam, Hale Apr 26, 1932, MRN [id number]  PCP:  [name], MD  Cardiologist:  Dr. Jens Som  Chief Complaint  Patient presents with  . Follow-up    seen for Dr. Jens Som.     History of Present Illness:  [name] is a 84 y.o. female with PMH of PAF on Pradaxa, DM, HTN, HLD, h/o DVT, PAD and h/o CVA 1999.  She had a Myoview performed in May 2007 that showed EF 80%, normal perfusion.  Echocardiogram in July 2009 showed a normal LV function, mild LAE.  She has been followed by vascular surgery for her peripheral arterial disease.  She was previously on 2.5 mg twice daily of Eliquis, this was later switched to Pradaxa in December 2018 due to cost reasons. She is not on aspirin given the need for pradaxa.  Patient presents today for cardiology office visit.  She has not had any issue after starting on Pradaxa.  She is due for basic metabolic panel and CBC in 2 weeks.  Otherwise she has been doing very well without any chest pain or shortness of breath.  She says the Pradaxa was causing too much and she has signed up for a company called [employer] which offers to reduce each medication down to $50 per month.  She is getting her Tradjenta and Pradaxa through the company.  She has already paid for the first month, $100 for 2 drugs. She says someone from her insurance company told her about this when she called to complain about the high price of previous eliquis. I talked to our clinical pharmacist who also have not heard of anything regarding this company, however we offered to see if she qualify for medication assistance program for free. Our clinical pharmacist has printed out the medication assistance forms for her trajenta and pradaxa. She will bring pradaxa form back to our office after filling out her income portion and bring the trajenda form to her PCP's office.      Past Medical History:  Diagnosis Date  . Atrial  fibrillation (HCC)   . Diabetes mellitus   . DVT (deep venous thrombosis) (HCC)   . Hyperlipidemia   . Hypertension   . Leg pain   . Peripheral arterial disease (HCC)   . Stroke [facility]) 1999    Past Surgical History:  Procedure Laterality Date  . BACK SURGERY  1996  . CATARACT EXTRACTION  2000  . CESAREAN SECTION    . EYE SURGERY    . FEMORAL BYPASS Left July 23, 2010  . LUMBAR DISC SURGERY    . SPINE SURGERY      Current Medications: Outpatient Medications Prior to Visit  Medication Sig Dispense Refill  . amLODipine (NORVASC) 10 MG tablet TAKE 1 TABLET EVERY DAY 30 tablet 10  . atorvastatin (LIPITOR) 40 MG tablet TAKE 1 TABLET BY MOUTH ONCE A DAY 30 tablet 7  . carvedilol (COREG) 6.25 MG tablet Take 6.25 mg by mouth 2 (two) times daily with a meal.      . dabigatran (PRADAXA) 150 MG CAPS capsule Take 1 capsule (150 mg total) by mouth 2 (two) times daily. 180 capsule 3  . fish oil-omega-3 fatty acids 1000 MG capsule Take 1 g by mouth daily.      Marland Kitchen glipiZIDE (GLUCOTROL) 10 MG tablet Take 10 mg by mouth 2 (two) times daily before a meal.      .  Lancet Devices (TRUEDRAW LANCING DEVICE) MISC     . latanoprost (XALATAN) 0.005 % ophthalmic solution Place 1 drop into both eyes at bedtime.    Marland Kitchen linagliptin (TRADJENTA) 5 MG TABS tablet Take 5 mg by mouth daily.    Marland Kitchen lisinopril (PRINIVIL,ZESTRIL) 10 MG tablet TAKE 1 TABLET DAILY 30 tablet 0  . metFORMIN (GLUCOPHAGE) 500 MG tablet Take 500 mg by mouth 2 (two) times daily with a meal.      . Multiple Vitamins-Minerals (CENTRUM SILVER PO) Take 1 tablet by mouth 1 dose over 24 hours.      . TRUETEST TEST test strip      No facility-administered medications prior to visit.      Allergies:   Patient has no known allergies.   Social History   Socioeconomic History  . Marital status: Widowed    Spouse name: None  . Number of children: None  . Years of education: None  . Highest education level: None  Social Needs  . Financial resource  strain: None  . Food insecurity - worry: None  . Food insecurity - inability: None  . Transportation needs - medical: None  . Transportation needs - non-medical: None  Occupational History  . None  Tobacco Use  . Smoking status: Never Smoker  . Smokeless tobacco: Never Used  Substance and Sexual Activity  . Alcohol use: No  . Drug use: No  . Sexual activity: None  Other Topics Concern  . None  Social History Narrative  . None     Family History:  The patient's family history includes Alzheimer's disease in her sister and sister; Dementia in her sister; Diabetes in her brother, brother, father, sister, and sister; Heart attack in her brother, mother, and sister; Heart disease in her brother and sister; Hypertension in her father and mother; Stroke in her father and sister.   ROS:   Please see the history of present illness.    ROS All other systems reviewed and are negative.   PHYSICAL EXAM:   VS:  BP 116/68   Pulse 99   Ht 5' (1.524 m)   Wt 95 lb (43.1 kg)   BMI 18.55 kg/m    GEN: Well nourished, well developed, in no acute distress  HEENT: normal  Neck: no JVD, carotid bruits, or masses Cardiac: RRR; no murmurs, rubs, or gallops,no edema  Respiratory:  clear to auscultation bilaterally, normal work of breathing GI: soft, nontender, nondistended, + BS MS: no deformity or atrophy  Skin: warm and dry, no rash Neuro:  Alert and Oriented x 3, Strength and sensation are intact Psych: euthymic mood, full affect  Wt Readings from Last 3 Encounters:  04/17/17 95 lb (43.1 kg)  02/13/17 93 lb 12.8 oz (42.5 kg)  03/21/16 96 lb (43.5 kg)      Studies/Labs Reviewed:   EKG:  EKG is ordered today.  The ekg ordered today demonstrates normal sinus rhythm with PACs  Recent Labs: 06/11/2016: BUN 16; Creatinine, Ser 0.67; Hemoglobin 11.7; Platelets 203; Potassium 4.6; Sodium 142   Lipid Panel    Component Value Date/Time   CHOL 117 03/21/2016 1116   TRIG 65 03/21/2016 1116     HDL 44 (L) 03/21/2016 1116   CHOLHDL 2.7 03/21/2016 1116   VLDL 13 03/21/2016 1116   LDLCALC 60 03/21/2016 1116    Additional studies/ records that were reviewed today include:    Echo 10/28/2007 SUMMARY - Overall left ventricular systolic function was normal. Left  ventricular ejection fraction was estimated to be 60 %. There    was mild focal basal septal hypertrophy. - The aortic valve was mildly calcified. - The left atrium was mildly dilated. - Poor image quality No obvious source of embolus. TEE if    clinically indicated would be more sensitive given poor image    quality  IMPRESSIONS - Poor image quality - No obvious source of embolus. TEE if clinically indicated would    be more sensitive given poor image quality  ASSESSMENT:    1. Paroxysmal atrial fibrillation (HCC)   2. Essential hypertension   3. Hyperlipidemia, unspecified hyperlipidemia type   4. PAD (peripheral artery disease) (HCC)   5. Controlled type 2 diabetes mellitus without complication, without long-term current use of insulin (HCC)   6. H/O: CVA (cerebrovascular accident)      PLAN:  In order of problems listed above:  1. PAF: Recently switched to Pradaxa for cost reasons, our clinical pharmacist will work with the patient try to apply for patient assistance program.  Otherwise maintaining sinus rhythm on current medication.  On physical exam, her heart may sounds irregular, this is due to PACs.  CBC and basic metabolic panel in 2 weeks  2. Hypertension: Blood pressure stable  3. Hyperlipidemia: On Lipitor 40 mg daily, annual lipid panel checked by primary care provider  4. PAD: Followed by Dr. Darrick Penna in the vascular surgery.  5. DM 2: We have printed out Tradjenta medication assistance form for her to take to her PCPs office.    Medication Adjustments/Labs and Tests Ordered: Current medicines are reviewed at length with the patient today.  Concerns  regarding medicines are outlined above.  Medication changes, Labs and Tests ordered today are listed in the Patient Instructions below. Patient Instructions  Cancel  Prescription [facility] out highlighted sections on form and bring back to office also bring proof of income ask to see Pharmacist   Lab work ( bmet,cbc ) in 2 weeks 05/01/17    Your physician wants you to follow-up with Dr.Crenshaw in 1 year. You will receive a reminder letter in the mail two months in advance. If you don't receive a letter, please call our office to schedule the follow-up appointment.     Ramond Dial, [place]  04/19/2017 9:41 AM    [facility] [address] [place], [place], [place]  [address] Phone: [phone]; Fax: [phone]

## 2017-04-17 NOTE — Patient Instructions (Addendum)
Cancel  [employer]   Fill out highlighted sections on form and bring back to office also bring proof of income ask to see Pharmacist   Lab work ( bmet,cbc ) in 2 weeks 05/01/17    Your physician wants you to follow-up with Dr.Crenshaw in 1 year. You will receive a reminder letter in the mail two months in advance. If you don't receive a letter, please call our office to schedule the follow-up appointment.

## 2017-04-19 ENCOUNTER — Encounter: Payer: Self-pay | Admitting: Physician Assistant

## 2017-04-23 ENCOUNTER — Telehealth: Payer: Self-pay | Admitting: Cardiology

## 2017-04-23 NOTE — Telephone Encounter (Signed)
Spoke with patient and she brought papers by from Dr Tedra Senegal office that had her proof of income with it. Patient wanted to know if that would be acceptable for proof of income for papers for patient assistance with Dr Jens Som. She brought papers in on 04/17/17. Advised patient unable to locate papers but she said ok to wait for Victorio Palm RN with Dr Jens Som is back next week. Will forward for review

## 2017-04-23 NOTE — Telephone Encounter (Signed)
Please call,wants to know if she needs to still send proof of her income,or was it attached to the papers from Dr Tedra Senegal office?

## 2017-04-28 NOTE — Telephone Encounter (Signed)
Spoke with pt, she is no longer using [employer]. She has other paperwork regarding pradaxa patient assistance that she is going to bring by the office.

## 2017-04-28 NOTE — Telephone Encounter (Signed)
Left message for patient, information mailed to [employer] for the pradaxa. Do not know anything about other paperwork. Ask patient to call to discuss.

## 2017-04-28 NOTE — Telephone Encounter (Signed)
Follow up  ° ° ° °Patient is returning Debra call  °

## 2017-05-01 ENCOUNTER — Telehealth: Payer: Self-pay | Admitting: *Deleted

## 2017-05-01 DIAGNOSIS — I480 Paroxysmal atrial fibrillation: Secondary | ICD-10-CM | POA: Diagnosis not present

## 2017-05-01 NOTE — Telephone Encounter (Signed)
Patient stopped by to bring  Patient assistance form for pradaxa to be completed and proof of income x2  Placed documents in  [place] 's box. patient is aware.

## 2017-05-02 ENCOUNTER — Other Ambulatory Visit: Payer: Self-pay | Admitting: *Deleted

## 2017-05-02 DIAGNOSIS — I480 Paroxysmal atrial fibrillation: Secondary | ICD-10-CM

## 2017-05-02 LAB — BASIC METABOLIC PANEL
BUN/Creatinine Ratio: 14 (ref 12–28)
BUN: 13 mg/dL (ref 8–27)
CALCIUM: 10 mg/dL (ref 8.7–10.3)
CO2: 21 mmol/L (ref 20–29)
Chloride: 102 mmol/L (ref 96–106)
Creatinine, Ser: 0.91 mg/dL (ref 0.57–1.00)
GFR calc non Af Amer: 59 mL/min/{1.73_m2} — ABNORMAL LOW (ref >59)
GFR, EST AFRICAN AMERICAN: 68 mL/min/{1.73_m2} (ref >59)
Glucose: 180 mg/dL — ABNORMAL HIGH (ref 65–99)
POTASSIUM: 4.6 mmol/L (ref 3.5–5.2)
Sodium: 141 mmol/L (ref 134–144)

## 2017-05-02 LAB — CBC WITH DIFFERENTIAL/PLATELET
Basophils Absolute: 0 10*3/uL (ref 0.0–0.2)
Basos: 0 %
EOS (ABSOLUTE): 0.1 10*3/uL (ref 0.0–0.4)
Eos: 1 %
Hematocrit: 34.3 % (ref 34.0–46.6)
Hemoglobin: 11.5 g/dL (ref 11.1–15.9)
IMMATURE GRANS (ABS): 0 10*3/uL (ref 0.0–0.1)
Immature Granulocytes: 0 %
LYMPHS: 32 %
Lymphocytes Absolute: 1.8 10*3/uL (ref 0.7–3.1)
MCH: 33 pg (ref 26.6–33.0)
MCHC: 33.5 g/dL (ref 31.5–35.7)
MCV: 98 fL — ABNORMAL HIGH (ref 79–97)
MONOCYTES: 11 %
Monocytes Absolute: 0.6 10*3/uL (ref 0.1–0.9)
NEUTROS ABS: 3.2 10*3/uL (ref 1.4–7.0)
Neutrophils: 56 %
PLATELETS: 280 10*3/uL (ref 150–379)
RBC: 3.49 x10E6/uL — ABNORMAL LOW (ref 3.77–5.28)
RDW: 14.2 % (ref 12.3–15.4)
WBC: 5.8 10*3/uL (ref 3.4–10.8)

## 2017-05-02 MED ORDER — DABIGATRAN ETEXILATE MESYLATE 150 MG PO CAPS: 150.0000 mg | ORAL_CAPSULE | Freq: Two times a day (BID) | ORAL | 3 refills | Status: DC

## 2017-05-02 NOTE — Telephone Encounter (Signed)
Left message for patient, form filled out and faxed to the company.

## 2017-05-05 ENCOUNTER — Telehealth: Payer: Self-pay | Admitting: Cardiology

## 2017-05-05 NOTE — Telephone Encounter (Signed)
Mrs.Jordahl is calling to follow up on some paperwork in which she dropped off on Thursday for Dr. Jens Som. Please call  Thanks

## 2017-05-05 NOTE — Telephone Encounter (Signed)
Spoke with patient and let her know that her form was faxed. She inquired about how long it would take for the insurance to process her application. Spoke with PharmD and she stated it could take up to 72 hours. I told her Someone from our office would contact her once we hear something back. Patient voiced understanding.

## 2017-05-14 ENCOUNTER — Other Ambulatory Visit: Payer: Self-pay | Admitting: *Deleted

## 2017-05-14 NOTE — Telephone Encounter (Signed)
Pt called in reference to her next appt as she was unsure of her next appt. Pt was instructed her Eliquis f/u appt is on 06/10/17 at 10am.

## 2017-05-16 DIAGNOSIS — L603 Nail dystrophy: Secondary | ICD-10-CM | POA: Diagnosis not present

## 2017-05-16 DIAGNOSIS — I739 Peripheral vascular disease, unspecified: Secondary | ICD-10-CM | POA: Diagnosis not present

## 2017-05-16 DIAGNOSIS — E1151 Type 2 diabetes mellitus with diabetic peripheral angiopathy without gangrene: Secondary | ICD-10-CM | POA: Diagnosis not present

## 2017-05-16 DIAGNOSIS — L84 Corns and callosities: Secondary | ICD-10-CM | POA: Diagnosis not present

## 2017-05-16 DIAGNOSIS — R6889 Other general symptoms and signs: Secondary | ICD-10-CM | POA: Diagnosis not present

## 2017-05-19 ENCOUNTER — Telehealth: Payer: Self-pay | Admitting: Physician Assistant

## 2017-05-19 NOTE — Telephone Encounter (Signed)
Returned the call to the patient. She stated that she was calling to check on her paperwork for the Pradaxa and Tradjenta. She has been informed that the paperwork for the Pradaxa has been faxed and we are waiting on a reply. She stated that it should have been for the Tradjenta as well. She has been informed that the prescriber for this medication should help with this. She has been informed that someone would call her with an update when we received one.

## 2017-05-19 NOTE — Telephone Encounter (Signed)
New message  Pt calling to check on some paperwork she dropped off for Dr. Jens Som regarding help with her medications Please call

## 2017-05-20 ENCOUNTER — Telehealth: Payer: Self-pay | Admitting: Cardiology

## 2017-05-20 MED ORDER — LINAGLIPTIN 5 MG PO TABS: 5.0000 mg | ORAL_TABLET | Freq: Every day | ORAL | 3 refills | Status: DC

## 2017-05-20 NOTE — Telephone Encounter (Signed)
Left message for patient, this is the last refill from our office. Any thing else will need to come from her medical doctor for the trajenta.

## 2017-05-20 NOTE — Telephone Encounter (Signed)
Pt says she need #90 and refills not #180.

## 2017-05-20 NOTE — Telephone Encounter (Signed)
°*  STAT* If patient is at the pharmacy, call can be transferred to refill team.   1. Which medications need to be refilled? (please list name of each medication and dose if known) Tradjenta-need asap please  2. Which pharmacy/location (including street and city if local pharmacy) is medication to be sent to?[facility]  3. Do they need a 30 day or 90 day supply? 180 and refills

## 2017-05-29 DIAGNOSIS — H401211 Low-tension glaucoma, right eye, mild stage: Secondary | ICD-10-CM | POA: Diagnosis not present

## 2017-05-29 DIAGNOSIS — E119 Type 2 diabetes mellitus without complications: Secondary | ICD-10-CM | POA: Diagnosis not present

## 2017-05-29 DIAGNOSIS — H40012 Open angle with borderline findings, low risk, left eye: Secondary | ICD-10-CM | POA: Diagnosis not present

## 2017-05-29 DIAGNOSIS — H35371 Puckering of macula, right eye: Secondary | ICD-10-CM | POA: Diagnosis not present

## 2017-06-10 ENCOUNTER — Ambulatory Visit: Payer: Medicare HMO | Admitting: *Deleted

## 2017-06-10 DIAGNOSIS — I4891 Unspecified atrial fibrillation: Secondary | ICD-10-CM

## 2017-06-10 DIAGNOSIS — Z5181 Encounter for therapeutic drug level monitoring: Secondary | ICD-10-CM

## 2017-06-10 DIAGNOSIS — R6889 Other general symptoms and signs: Secondary | ICD-10-CM | POA: Diagnosis not present

## 2017-06-10 LAB — BASIC METABOLIC PANEL
BUN / CREAT RATIO: 17 (ref 12–28)
BUN: 15 mg/dL (ref 8–27)
CHLORIDE: 100 mmol/L (ref 96–106)
CO2: 21 mmol/L (ref 20–29)
Calcium: 9.8 mg/dL (ref 8.7–10.3)
Creatinine, Ser: 0.9 mg/dL (ref 0.57–1.00)
GFR calc Af Amer: 69 mL/min/{1.73_m2} (ref >59)
GFR calc non Af Amer: 60 mL/min/{1.73_m2} (ref >59)
GLUCOSE: 252 mg/dL — AB (ref 65–99)
Potassium: 4.3 mmol/L (ref 3.5–5.2)
SODIUM: 138 mmol/L (ref 134–144)

## 2017-06-10 LAB — CBC
Hematocrit: 34.2 % (ref 34.0–46.6)
Hemoglobin: 11 g/dL — ABNORMAL LOW (ref 11.1–15.9)
MCH: 32.4 pg (ref 26.6–33.0)
MCHC: 32.2 g/dL (ref 31.5–35.7)
MCV: 101 fL — ABNORMAL HIGH (ref 79–97)
Platelets: 237 10*3/uL (ref 150–379)
RBC: 3.39 x10E6/uL — ABNORMAL LOW (ref 3.77–5.28)
RDW: 14.1 % (ref 12.3–15.4)
WBC: 4.1 10*3/uL (ref 3.4–10.8)

## 2017-06-10 NOTE — Progress Notes (Signed)
Pt was switched to Pradaxa 150mg  BID  for Afib on 05/03/17 from Eliquis.     Reviewed patients medication list.  Pt is not currently on any combined P-gp and strong CYP3A4 inhibitors/inducers (ketoconazole, traconazole, ritonavir, carbamazepine, phenytoin, rifampin, St. John's wort).  Reviewed labs.  SCr 0.90, Weight 93.4 lbs, CrCl- 32.  Dose  appropriated based on CrCl.   Hgb and HCT 11.0/34.2 reviewed with Darrel Hoover D.   A full discussion of the nature of anticoagulants has been carried out.  A benefit/risk analysis has been presented to the patient, so that they understand the justification for choosing anticoagulation with Praxada at this time.  The need for compliance is stressed.  Pt is aware to take the medication twice daily.  Side effects of potential bleeding are discussed, including unusual colored urine or stools, coughing up blood or coffee ground emesis, nose bleeds or serious fall or head trauma.  Discussed signs and symptoms of stroke. The patient should avoid any OTC items containing aspirin or ibuprofen.  Avoid alcohol consumption.   Call if any signs of abnormal bleeding.  Discussed financial obligations and resolved any difficulty in obtaining medication.  Asked pt to call MD if any medications change or he is scheduled for a procedure. Next lab test test in 6 months.

## 2017-06-10 NOTE — Patient Instructions (Signed)
A full discussion of the nature of anticoagulants has been carried out.  A benefit/risk analysis has been presented to the patient, so that they understand the justification for choosing anticoagulation with Praxada at this time.  The need for compliance is stressed.  Pt is aware to take the medication twice daily.  Side effects of potential bleeding are discussed, including unusual colored urine or stools, coughing up blood or coffee ground emesis, nose bleeds or serious fall or head trauma.  Discussed signs and symptoms of stroke. The patient should avoid any OTC items containing aspirin or ibuprofen.  Avoid alcohol consumption.   Call if any signs of abnormal bleeding.  Discussed financial obligations and resolved any difficulty in obtaining medication.  Asked pt to call MD if any medications change or he is scheduled for a procedure.

## 2017-06-11 ENCOUNTER — Encounter: Payer: Self-pay | Admitting: *Deleted

## 2017-06-11 ENCOUNTER — Telehealth: Payer: Self-pay | Admitting: Physician Assistant

## 2017-06-11 NOTE — Telephone Encounter (Signed)
Returned call to patient who states she got Pradaxa from the patient assistance program thru the drug company. She states she is unsure how she will receive more medication when she needs it. She called the drug company this AM, but advised patient they do no open until 730AM CST, so she will need to wait until at least 830am our time to call. She advised she will call the company back for assistance with next steps.

## 2017-06-11 NOTE — Telephone Encounter (Signed)
New Message   Pt c/o medication issue:  1. Name of Medication: dabigatran (PRADAXA) 150 MG CAPS capsule  2. How are you currently taking this medication (dosage and times per day)? Take 1 capsule (150 mg total) by mouth 2 (two) times daily  3. Are you having a reaction (difficulty breathing--STAT)? no  4. What is your medication issue? Pt states that when she received her medication order that it was suppose to include a paper with instructions but it didn't and she wants to know what she should do. Please call

## 2017-07-09 DIAGNOSIS — I10 Essential (primary) hypertension: Secondary | ICD-10-CM | POA: Diagnosis not present

## 2017-07-09 DIAGNOSIS — R6889 Other general symptoms and signs: Secondary | ICD-10-CM | POA: Diagnosis not present

## 2017-07-09 DIAGNOSIS — E0800 Diabetes mellitus due to underlying condition with hyperosmolarity without nonketotic hyperglycemic-hyperosmolar coma (NKHHC): Secondary | ICD-10-CM | POA: Diagnosis not present

## 2017-07-09 DIAGNOSIS — E559 Vitamin D deficiency, unspecified: Secondary | ICD-10-CM | POA: Diagnosis not present

## 2017-07-09 DIAGNOSIS — E785 Hyperlipidemia, unspecified: Secondary | ICD-10-CM | POA: Diagnosis not present

## 2017-07-09 DIAGNOSIS — I6789 Other cerebrovascular disease: Secondary | ICD-10-CM | POA: Diagnosis not present

## 2017-07-09 DIAGNOSIS — E7800 Pure hypercholesterolemia, unspecified: Secondary | ICD-10-CM | POA: Diagnosis not present

## 2017-07-09 DIAGNOSIS — M62830 Muscle spasm of back: Secondary | ICD-10-CM | POA: Diagnosis not present

## 2017-07-14 ENCOUNTER — Ambulatory Visit
Admission: RE | Admit: 2017-07-14 | Discharge: 2017-07-14 | Disposition: A | Discharge status: Home / Self Care | Payer: Medicare HMO | Source: Ambulatory Visit | Attending: Family Medicine | Admitting: Family Medicine

## 2017-07-14 ENCOUNTER — Other Ambulatory Visit: Payer: Self-pay | Admitting: Family Medicine

## 2017-07-14 DIAGNOSIS — M47816 Spondylosis without myelopathy or radiculopathy, lumbar region: Secondary | ICD-10-CM | POA: Diagnosis not present

## 2017-07-14 DIAGNOSIS — R6889 Other general symptoms and signs: Secondary | ICD-10-CM | POA: Diagnosis not present

## 2017-07-14 DIAGNOSIS — M545 Low back pain: Secondary | ICD-10-CM

## 2017-07-18 DIAGNOSIS — M85851 Other specified disorders of bone density and structure, right thigh: Secondary | ICD-10-CM | POA: Diagnosis not present

## 2017-07-18 DIAGNOSIS — M810 Age-related osteoporosis without current pathological fracture: Secondary | ICD-10-CM | POA: Diagnosis not present

## 2017-07-18 DIAGNOSIS — R6889 Other general symptoms and signs: Secondary | ICD-10-CM | POA: Diagnosis not present

## 2017-07-23 DIAGNOSIS — Z1212 Encounter for screening for malignant neoplasm of rectum: Secondary | ICD-10-CM | POA: Diagnosis not present

## 2017-07-23 DIAGNOSIS — Z1211 Encounter for screening for malignant neoplasm of colon: Secondary | ICD-10-CM | POA: Diagnosis not present

## 2017-07-30 DIAGNOSIS — M818 Other osteoporosis without current pathological fracture: Secondary | ICD-10-CM | POA: Diagnosis not present

## 2017-07-30 DIAGNOSIS — M130 Polyarthritis, unspecified: Secondary | ICD-10-CM | POA: Diagnosis not present

## 2017-08-14 DIAGNOSIS — E1151 Type 2 diabetes mellitus with diabetic peripheral angiopathy without gangrene: Secondary | ICD-10-CM | POA: Diagnosis not present

## 2017-08-14 DIAGNOSIS — I739 Peripheral vascular disease, unspecified: Secondary | ICD-10-CM | POA: Diagnosis not present

## 2017-08-14 DIAGNOSIS — L84 Corns and callosities: Secondary | ICD-10-CM | POA: Diagnosis not present

## 2017-08-14 DIAGNOSIS — R6889 Other general symptoms and signs: Secondary | ICD-10-CM | POA: Diagnosis not present

## 2017-08-14 DIAGNOSIS — L603 Nail dystrophy: Secondary | ICD-10-CM | POA: Diagnosis not present

## 2017-09-04 ENCOUNTER — Encounter: Payer: Self-pay | Admitting: Physical Therapy

## 2017-09-04 ENCOUNTER — Other Ambulatory Visit: Payer: Self-pay

## 2017-09-04 ENCOUNTER — Ambulatory Visit: Payer: Medicare HMO | Attending: Family Medicine | Admitting: Physical Therapy

## 2017-09-04 DIAGNOSIS — M545 Low back pain, unspecified: Secondary | ICD-10-CM

## 2017-09-04 DIAGNOSIS — M6281 Muscle weakness (generalized): Secondary | ICD-10-CM

## 2017-09-04 DIAGNOSIS — G8929 Other chronic pain: Secondary | ICD-10-CM | POA: Diagnosis not present

## 2017-09-04 NOTE — Therapy (Signed)
[facility] [address] [place], [place], [address] Phone: [phone]   Fax:  [phone]  Physical Therapy Evaluation  Patient Details  Name: [name] MRN: [id number] Date of Birth: 07/26/1932 Referring Provider: [name]    Encounter Date: 09/04/2017  PT End of Session - 09/04/17 1435    Visit Number  1    Number of Visits  1    Date for PT Re-Evaluation  09/04/17    Authorization Type  [employer] medicare     Authorization Time Period  09/04/17 to 09/04/17    PT Start Time  1406    PT Stop Time  1430    PT Time Calculation (min)  24 min    Activity Tolerance  Patient tolerated treatment well    Behavior During Therapy  [facility] for tasks assessed/performed       Past Medical History:  Diagnosis Date  . Atrial fibrillation (HCC)   . Diabetes mellitus   . DVT (deep venous thrombosis) (HCC)   . Hyperlipidemia   . Hypertension   . Leg pain   . Peripheral arterial disease (HCC)   . Stroke [facility]) 1999    Past Surgical History:  Procedure Laterality Date  . BACK SURGERY  1996  . CATARACT EXTRACTION  2000  . CESAREAN SECTION    . EYE SURGERY    . FEMORAL BYPASS Left July 23, 2010  . LUMBAR DISC SURGERY    . SPINE SURGERY      There were no vitals filed for this visit.   Subjective Assessment - 09/04/17 1404    Subjective  My pain started on the right side of my stomach and radiated around to my back; my MD gave me some ointment which I put on and it really took away my pain. I'm not really having any pain.  I started putting on the ointment since 07/09/17. I would still like to be checked out by PT. I have numbness in my left foot following a vascular surgery on this side, I had a stent put in and its still numb.     How long can you sit comfortably?  no limits     How long can you stand comfortably?  no limits     How long can you walk comfortably?  no limits     Patient Stated Goals  see what caused my back  pain in the first place     Currently in Pain?  No/denies         [facility] PT Assessment - 09/04/17 0001      Assessment   Medical Diagnosis  back pain     Referring Provider  [name]     Onset Date/Surgical Date  -- early march 2019    Next MD Visit  Dr. Parke Simmers in 3 months     Prior Therapy  none       Precautions   Precautions  None      Restrictions   Weight Bearing Restrictions  No      Balance Screen   Has the patient fallen in the past 6 months  No    Has the patient had a decrease in activity level because of a fear of falling?   No    Is the patient reluctant to leave their home because of a fear of falling?   No      Prior Function   Level of Independence  Independent;Independent with basic ADLs;Independent  with gait;Independent with transfers    Vocation  Retired    Leisure  bowling, Acupuncturist, shopping       Observation/Other Assessments   Observations  20-30 seconds SLS B       ROM / Strength   AROM / PROM / Strength  AROM;Strength      AROM   AROM Assessment Site  Lumbar;Thoracic    Lumbar Flexion  full ROM     Lumbar Extension  mild limitation, no pain     Lumbar - Right Side Bend  full ROM     Lumbar - Left Side Bend  full ROM     Thoracic Flexion  mild limitatoin     Thoracic Extension  mild limitation     Thoracic - Right Side Bend  full ROM     Thoracic - Left Side Bend  full ROM     Thoracic - Right Rotation  full ROM     Thoracic - Left Rotation  full ROM       Strength   Strength Assessment Site  Hip;Knee    Right/Left Hip  Right;Left    Right Hip Flexion  5/5    Right Hip Extension  4+/5    Right Hip ABduction  4+/5    Left Hip Flexion  4+/5    Left Hip Extension  4+/5    Left Hip ABduction  5/5    Right/Left Knee  Right;Left    Right Knee Flexion  4+/5    Right Knee Extension  5/5    Left Knee Flexion  4+/5    Left Knee Extension  5/5      Flexibility   Soft Tissue Assessment /Muscle Length  yes    Hamstrings  mild tightness L  LE, WFL R LE     Piriformis  normal bilaterally                 Objective measurements completed on examination: See above findings.              PT Education - 09/04/17 1434    Education provided  Yes    Education Details  exam findings, not in need of skilled PT services- one time visit only; contact MD for new PT referral if pain should return     Person(s) Educated  Patient    Methods  Explanation    Comprehension  Verbalized understanding       PT Short Term Goals - 09/04/17 1436      PT SHORT TERM GOAL #1   Title  Patient to verbalize understanding of findings from PT examination as well as PT recommendations moving forward     Time  1    Period  Days    Status  Achieved    Target Date  09/04/17        PT Long Term Goals - 09/04/17 1436      PT LONG TERM GOAL #1   Title  No LTGs appropriate- patient not in need of skilled PT services              Plan - 09/04/17 1435    Clinical Impression Statement  Patient arrives reporting that she had back pain that started in early March 2019, however her MD gave her an ointment to use and it has consistently taken away the pain since she started using it on March 27th. While examination reveals mild functional muscle weakness and flexibility deficits, patient in general is  in good health and does not appear to be in need of skilled PT services. She reports she is very active typically and is not having pain, she is able to perform everything she needs and wants to do at this point. She is not in need of skilled PT services at this time, but was encouraged to ask her MD for a new referral should her pain return. PT signing off, thank you for the referral.     Clinical Presentation  Stable    Clinical Decision Making  Low    Rehab Potential  Excellent    PT Frequency  One time visit    PT Duration  Other (comment) one time visit only     PT Treatment/Interventions  ADLs/Self Care Home Management    PT  Next Visit Plan  None- one time visit only, paitent not in need of skilled PT services at this time     Consulted and Agree with Plan of Care  Patient       Patient will benefit from skilled therapeutic intervention in order to improve the following deficits and impairments:  Decreased strength, Decreased balance, Impaired flexibility  Visit Diagnosis: Chronic bilateral low back pain without sciatica - Plan: PT plan of care cert/re-cert  Muscle weakness (generalized) - Plan: PT plan of care cert/re-cert     Problem List Patient Active Problem List   Diagnosis Date Noted  . Cramp of both lower extremities 12/15/2014  . Foot cramps 12/15/2014  . Stiffness of foot 06/10/2014  . Aftercare following surgery of the circulatory system, NEC 12/09/2013  . Peripheral vascular disease (HCC) 10/03/2011  . Bypass graft stenosis (HCC) 07/04/2011  . Chronic total occlusion of artery of the extremities (HCC) 07/04/2011  . PURE HYPERCHOLESTEROLEMIA 03/29/2009  . ESSENTIAL HYPERTENSION, MALIGNANT 03/29/2009  . ESSENTIAL HYPERTENSION, BENIGN 03/28/2009  . DIABETES MELLITUS 08/30/2008  . HYPERLIPIDEMIA-MIXED 08/30/2008  . ATRIAL FIBRILLATION 08/30/2008  . SYNCOPE AND COLLAPSE 08/30/2008    Nedra Hai PT, DPT, CBIS  Supplemental Physical Therapist [facility]   Pager [phone]   [facility] [address] [place], [place], [address] Phone: [phone]   Fax:  [phone]  Name: [name] MRN: [id number] Date of Birth: [birth year]

## 2017-11-10 DIAGNOSIS — E1151 Type 2 diabetes mellitus with diabetic peripheral angiopathy without gangrene: Secondary | ICD-10-CM | POA: Diagnosis not present

## 2017-11-10 DIAGNOSIS — I739 Peripheral vascular disease, unspecified: Secondary | ICD-10-CM | POA: Diagnosis not present

## 2017-11-10 DIAGNOSIS — L84 Corns and callosities: Secondary | ICD-10-CM | POA: Diagnosis not present

## 2017-11-10 DIAGNOSIS — L603 Nail dystrophy: Secondary | ICD-10-CM | POA: Diagnosis not present

## 2017-11-27 DIAGNOSIS — I6789 Other cerebrovascular disease: Secondary | ICD-10-CM | POA: Diagnosis not present

## 2017-11-27 DIAGNOSIS — R7309 Other abnormal glucose: Secondary | ICD-10-CM | POA: Diagnosis not present

## 2017-11-27 DIAGNOSIS — E1100 Type 2 diabetes mellitus with hyperosmolarity without nonketotic hyperglycemic-hyperosmolar coma (NKHHC): Secondary | ICD-10-CM | POA: Diagnosis not present

## 2017-11-27 DIAGNOSIS — E782 Mixed hyperlipidemia: Secondary | ICD-10-CM | POA: Diagnosis not present

## 2017-11-27 DIAGNOSIS — I10 Essential (primary) hypertension: Secondary | ICD-10-CM | POA: Diagnosis not present

## 2017-11-27 DIAGNOSIS — Z681 Body mass index (BMI) 19 or less, adult: Secondary | ICD-10-CM | POA: Diagnosis not present

## 2017-12-09 ENCOUNTER — Ambulatory Visit (INDEPENDENT_AMBULATORY_CARE_PROVIDER_SITE_OTHER): Payer: Medicare HMO | Admitting: *Deleted

## 2017-12-09 DIAGNOSIS — I480 Paroxysmal atrial fibrillation: Secondary | ICD-10-CM

## 2017-12-09 LAB — BASIC METABOLIC PANEL
BUN/Creatinine Ratio: 16 (ref 12–28)
BUN: 13 mg/dL (ref 8–27)
CALCIUM: 10.1 mg/dL (ref 8.7–10.3)
CO2: 22 mmol/L (ref 20–29)
CREATININE: 0.79 mg/dL (ref 0.57–1.00)
Chloride: 100 mmol/L (ref 96–106)
GFR calc Af Amer: 81 mL/min/{1.73_m2} (ref >59)
GFR, EST NON AFRICAN AMERICAN: 70 mL/min/{1.73_m2} (ref >59)
Glucose: 170 mg/dL — ABNORMAL HIGH (ref 65–99)
Potassium: 5 mmol/L (ref 3.5–5.2)
Sodium: 140 mmol/L (ref 134–144)

## 2017-12-09 LAB — CBC
HEMATOCRIT: 35.5 % (ref 34.0–46.6)
Hemoglobin: 11.9 g/dL (ref 11.1–15.9)
MCH: 33.3 pg — ABNORMAL HIGH (ref 26.6–33.0)
MCHC: 33.5 g/dL (ref 31.5–35.7)
MCV: 99 fL — ABNORMAL HIGH (ref 79–97)
Platelets: 241 10*3/uL (ref 150–450)
RBC: 3.57 x10E6/uL — ABNORMAL LOW (ref 3.77–5.28)
RDW: 14.2 % (ref 12.3–15.4)
WBC: 4.3 10*3/uL (ref 3.4–10.8)

## 2017-12-09 NOTE — Progress Notes (Signed)
Pt was started on Pradaxa 150mg  BID for Afib on 05/03/17 from Eliquis.     Reviewed patients medication list.  Pt is not currently on any combined P-gp and strong CYP3A4 inhibitors/inducers (ketoconazole, traconazole, ritonavir, carbamazepine, phenytoin, rifampin, St. John's wort).  Reviewed labs.  SCr 0.79, Weight 93.4lbs, CrCl- 49mL/min.  Dose appropriate based on CrCl, 150mg  BID.    Hgb and HCT 11.9/35.5.    A full discussion of the nature of anticoagulants has been carried out.  A benefit/risk analysis has been presented to the patient, so that they understand the justification for choosing anticoagulation with Praxada at this time.  The need for compliance is stressed.  Pt is aware to take the medication twice daily.  Side effects of potential bleeding are discussed, including unusual colored urine or stools, coughing up blood or coffee ground emesis, nose bleeds or serious fall or head trauma.  Discussed signs and symptoms of stroke. The patient should avoid any OTC items containing aspirin or ibuprofen.  Avoid alcohol consumption.   Call if any signs of abnormal bleeding.  Discussed financial obligations and resolved any difficulty in obtaining medication.  Asked pt to call MD if any medications change or he is scheduled for a procedure. Follow with cardiologist as scheduled.

## 2017-12-10 MED ORDER — DABIGATRAN ETEXILATE MESYLATE 150 MG PO CAPS: 150.0000 mg | ORAL_CAPSULE | Freq: Two times a day (BID) | ORAL | 3 refills | Status: DC

## 2018-01-06 DIAGNOSIS — H401211 Low-tension glaucoma, right eye, mild stage: Secondary | ICD-10-CM | POA: Diagnosis not present

## 2018-01-06 DIAGNOSIS — H40012 Open angle with borderline findings, low risk, left eye: Secondary | ICD-10-CM | POA: Diagnosis not present

## 2018-01-26 DIAGNOSIS — Z1231 Encounter for screening mammogram for malignant neoplasm of breast: Secondary | ICD-10-CM | POA: Diagnosis not present

## 2018-01-27 DIAGNOSIS — I6789 Other cerebrovascular disease: Secondary | ICD-10-CM | POA: Diagnosis not present

## 2018-01-27 DIAGNOSIS — E089 Diabetes mellitus due to underlying condition without complications: Secondary | ICD-10-CM | POA: Diagnosis not present

## 2018-01-27 DIAGNOSIS — Z681 Body mass index (BMI) 19 or less, adult: Secondary | ICD-10-CM | POA: Diagnosis not present

## 2018-01-27 DIAGNOSIS — I10 Essential (primary) hypertension: Secondary | ICD-10-CM | POA: Diagnosis not present

## 2018-02-12 DIAGNOSIS — L84 Corns and callosities: Secondary | ICD-10-CM | POA: Diagnosis not present

## 2018-02-12 DIAGNOSIS — I739 Peripheral vascular disease, unspecified: Secondary | ICD-10-CM | POA: Diagnosis not present

## 2018-02-12 DIAGNOSIS — E1151 Type 2 diabetes mellitus with diabetic peripheral angiopathy without gangrene: Secondary | ICD-10-CM | POA: Diagnosis not present

## 2018-02-12 DIAGNOSIS — L603 Nail dystrophy: Secondary | ICD-10-CM | POA: Diagnosis not present

## 2018-02-23 ENCOUNTER — Telehealth: Payer: Self-pay

## 2018-02-23 NOTE — Telephone Encounter (Addendum)
Pt assistance form for Pradaxa dropped off by pt.   Patient assistance form for Pradaxa completed and signed by Dr.Crenshaw. Completed form faxed to [employer]. Fax # [phone].

## 2018-02-24 ENCOUNTER — Telehealth: Payer: Self-pay | Admitting: Cardiology

## 2018-02-24 ENCOUNTER — Other Ambulatory Visit: Payer: Self-pay

## 2018-02-24 ENCOUNTER — Ambulatory Visit (INDEPENDENT_AMBULATORY_CARE_PROVIDER_SITE_OTHER): Payer: Medicare HMO | Admitting: Family

## 2018-02-24 ENCOUNTER — Ambulatory Visit (HOSPITAL_COMMUNITY)
Admission: RE | Admit: 2018-02-24 | Discharge: 2018-02-24 | Disposition: A | Discharge status: Home / Self Care | Payer: Medicare HMO | Source: Ambulatory Visit | Attending: Family | Admitting: Family

## 2018-02-24 ENCOUNTER — Encounter: Payer: Self-pay | Admitting: Family

## 2018-02-24 VITALS — BP 141/70 | HR 78 | Temp 97.6°F | Resp 14 | Ht 59.5 in | Wt 96.0 lb

## 2018-02-24 DIAGNOSIS — I779 Disorder of arteries and arterioles, unspecified: Secondary | ICD-10-CM | POA: Diagnosis not present

## 2018-02-24 DIAGNOSIS — E1151 Type 2 diabetes mellitus with diabetic peripheral angiopathy without gangrene: Secondary | ICD-10-CM | POA: Diagnosis not present

## 2018-02-24 DIAGNOSIS — Z9889 Other specified postprocedural states: Secondary | ICD-10-CM | POA: Diagnosis not present

## 2018-02-24 DIAGNOSIS — T82898S Other specified complication of vascular prosthetic devices, implants and grafts, sequela: Secondary | ICD-10-CM

## 2018-02-24 NOTE — Telephone Encounter (Signed)
Spoke with pt. Pt sts that "page 4" of the completed patient assistance form for Pradaxa needs to be refaxed to the fax # listed on the form.  Refaxed form to [phone]

## 2018-02-24 NOTE — Telephone Encounter (Signed)
SPOKE TO PATIENT. THE MEDICATION IN QUESTION IS THE PRADAXA.  SHE STATES THE COMPANY - PAGE "4" WAS NOT RECEIVED.   RN INFORMED PATIENT WILL DEFER TO NURSE WHO FAXED INFORMATION TO COMPANY

## 2018-02-24 NOTE — Progress Notes (Signed)
[facility] [place]   CC: Follow up peripheral artery occlusive disease  History of Present Illness [name] is a 84 y.o. female who is s/p left above knee to below knee popliteal artery bypass graft on 07/23/10 by Dr. Darrick Penna, and her bypass has been occluded since 2012.  She denies claudication symptoms with walking, denies non healing wounds. She reports stiffness in left foot and ankle since 2012.  She had a stroke in 1999 as manifested by expressive and possibly receptive aphasia, denies hemiparesis, denies monocular loss of vision, denies any stroke or TIA activity since then; noted history of atrial fib. She is right hand dominant.  She walks for 2 hours continuously in her house daily. She also does push ups, and other exercises daily. She states her appetite is good.  Diabetic: Yes, last A1C was 8.0 (November 2015, review of records), uncontrolled, pt states about the same Tobacco use: non-smoker  Pt meds include: Statin :Yes Betablocker: Yes ASA: No Other anticoagulants/antiplatelets: Pradaxafor atrial fib    Past Medical History:  Diagnosis Date  . Atrial fibrillation (HCC)   . Diabetes mellitus   . DVT (deep venous thrombosis) (HCC)   . Hyperlipidemia   . Hypertension   . Leg pain   . Peripheral arterial disease (HCC)   . Stroke [facility]) 1999    Social History Social History   Tobacco Use  . Smoking status: Never Smoker  . Smokeless tobacco: Never Used  Substance Use Topics  . Alcohol use: No  . Drug use: No    Family History Family History  Problem Relation Age of Onset  . Hypertension Mother   . Heart attack Mother   . Diabetes Father   . Hypertension Father   . Stroke Father   . Stroke Sister   . Diabetes Sister   . Heart disease Sister        Before age 76-Aneurysm  . Dementia Sister   . Alzheimer's disease Sister   . Diabetes Brother   . Diabetes Brother   . Heart disease Brother        Before age 26  .  Heart attack Brother   . Heart attack Sister   . Diabetes Sister   . Alzheimer's disease Sister     Past Surgical History:  Procedure Laterality Date  . BACK SURGERY  1996  . CATARACT EXTRACTION  2000  . CESAREAN SECTION    . EYE SURGERY    . FEMORAL BYPASS Left July 23, 2010  . LUMBAR DISC SURGERY    . SPINE SURGERY      No Known Allergies  Current Outpatient Medications  Medication Sig Dispense Refill  . ACCU-CHEK SOFTCLIX LANCETS lancets     . amLODipine (NORVASC) 10 MG tablet TAKE 1 TABLET EVERY DAY 30 tablet 10  . atorvastatin (LIPITOR) 40 MG tablet TAKE 1 TABLET BY MOUTH ONCE A DAY 30 tablet 7  . carvedilol (COREG) 6.25 MG tablet Take 6.25 mg by mouth 2 (two) times daily with a meal.      . dabigatran (PRADAXA) 150 MG CAPS capsule Take 1 capsule (150 mg total) by mouth 2 (two) times daily. 180 capsule 3  . diclofenac sodium (VOLTAREN) 1 % GEL APPLY 2 INCHES TO EACH AFFECT JOINT 4 TIMES A DAY  5  . fish oil-omega-3 fatty acids 1000 MG capsule Take 1 g by mouth daily.      Marland Kitchen glipiZIDE (GLUCOTROL) 10 MG tablet Take 10 mg by  mouth 2 (two) times daily before a meal.      . Lancet Devices (TRUEDRAW LANCING DEVICE) MISC     . latanoprost (XALATAN) 0.005 % ophthalmic solution Place 1 drop into both eyes at bedtime.    Marland Kitchen linagliptin (TRADJENTA) 5 MG TABS tablet Take 1 tablet (5 mg total) by mouth daily. 90 tablet 3  . lisinopril (PRINIVIL,ZESTRIL) 10 MG tablet TAKE 1 TABLET DAILY 30 tablet 0  . metFORMIN (GLUCOPHAGE) 500 MG tablet Take 500 mg by mouth 2 (two) times daily with a meal.      . Multiple Vitamins-Minerals (CENTRUM SILVER PO) Take 1 tablet by mouth 1 dose over 24 hours.      . TRUETEST TEST test strip      No current facility-administered medications for this visit.     ROS: See HPI for pertinent positives and negatives.   Physical Examination  Vitals:   02/24/18 1530  BP: (!) 141/70  Pulse: 78  Resp: 14  Temp: 97.6 F (36.4 C)  TempSrc: Oral  SpO2: 98%   Weight: 96 lb (43.5 kg)  Height: 4' 11.5" (1.511 m)   Body mass index is 19.07 kg/m.  General: A&O x 3, WDWN, petite female. Gait: normal HENT: No gross abnormalities.  Eyes: PERRLA. Pulmonary: Respirations are non labored, CTAB, good air movement in all fields Cardiac: Irregular rhythm with controlled rate, no detected murmur.         Carotid Bruits Right Left   Negative Negative   Radial pulses are 2+ palpable bilaterally   Adominal aortic pulse is not palpable                         VASCULAR EXAM: Extremities without ischemic changes, without Gangrene; without open wounds.                                                                                                          LE Pulses Right Left       FEMORAL  2+ palpable  2+ palpable        POPLITEAL  not palpable   not palpable       POSTERIOR TIBIAL  not palpable   not palpable        DORSALIS PEDIS      ANTERIOR TIBIAL 2+ palpable  not palpable    Abdomen: soft, NT, no palpable masses. Skin: no rashes, no cellulitis, no ulcers noted. Musculoskeletal: no muscle wasting or atrophy.  Neurologic: A&O X 3; appropriate affect, Sensation is normal; MOTOR FUNCTION:  moving all extremities equally, motor strength 5/5 throughout. Speech is fluent/normal. CN 2-12 intact. Psychiatric: Thought content is normal, mood appropriate for clinical situation.    ASSESSMENT: [name] is a 84 y.o. female who is s/p left above knee to below knee popliteal artery bypass graft on 07/23/10, and her bypass has been occluded since 2012. She walks and exercises a great deal daily, has no claudication symptoms, has no non healing wounds. Her right DP pulse is 2+ palpable.  Her ankle arterial vessels are non compressible, likely from long term uncontrolled DM. Bi and triphasic waveforms in the right ankle, monophasic in the left ankle.   Her primary atherosclerotic risk factor remains her uncontrolled DM.  Fortunately she has  never used tobacco. She takes a statin and Pradaxa.    ABI (Date: 02/24/2018):  R:   ABI: [place] (was [place] on 02-13-17),   PT: bi  DP: tri  TBI:  0.52, toe pressure 78, (was 0.59)  L:   ABI: [place] (was [place]),   PT: mono  DP: mono  TBI: 0.33, toe pressure 49, (was 0.29) Bilateral ankle vessels remain calcified and non compressible. Waveforms in the right are tri and biphasic, left are monophasic.     PLAN:  Based on the patient's vascular studies and examination, pt will return to clinic in 1 year with ABI's. She knows to notify us if she develops concerns re the circulation in her feet/legs.   Continue extensive walking.  I discussed in depth with the patient the nature of atherosclerosis, and emphasized the importance of maximal medical management including strict control of blood pressure, blood glucose, and lipid levels, obtaining regular exercise, and continued cessation of smoking.  The patient is aware that without maximal medical management the underlying atherosclerotic disease process will progress, limiting the benefit of any interventions.  The patient was given information about PAD including signs, symptoms, treatment, what symptoms should prompt the patient to seek immediate medical care, and risk reduction measures to take.  Charisse March, RN, MSN, FNP-C [facility] Phone: [phone]  Clinic MD: Chestine Spore  02/24/18 5:07 PM

## 2018-02-24 NOTE — Patient Instructions (Signed)

## 2018-02-24 NOTE — Telephone Encounter (Signed)
New Message:    Pt says she had applied for patient assistant for her Tradjenta.. She said they notified her and said that page 4 of her application was missing. They need you to fax that page asap please.

## 2018-03-02 ENCOUNTER — Other Ambulatory Visit: Payer: Self-pay | Admitting: Cardiology

## 2018-03-02 NOTE — Telephone Encounter (Signed)
Received fax regarding pradaxa pt assistance, pt was denied because she has medicaid. Left message for the patient, she is to call with questions or concerns.

## 2018-03-02 NOTE — Telephone Encounter (Signed)
Patient has been approved for assistance for pradaxa. Nothing is needed at this time per the company.

## 2018-03-02 NOTE — Telephone Encounter (Signed)
 *  STAT* If patient is at the pharmacy, call can be transferred to refill team.   1. Which medications need to be refilled? (please list name of each medication and dose if known) dabigatran (PRADAXA) 150 MG CAPS capsule  2. Which pharmacy/location (including street and city if local pharmacy) is medication to be sent to? [facility]  3. Do they need a 30 day or 90 day supply? 18  Patient spoke with assistance program and she is now approved

## 2018-04-29 DIAGNOSIS — Z681 Body mass index (BMI) 19 or less, adult: Secondary | ICD-10-CM | POA: Diagnosis not present

## 2018-04-29 DIAGNOSIS — E785 Hyperlipidemia, unspecified: Secondary | ICD-10-CM | POA: Diagnosis not present

## 2018-04-29 DIAGNOSIS — I739 Peripheral vascular disease, unspecified: Secondary | ICD-10-CM | POA: Diagnosis not present

## 2018-04-29 DIAGNOSIS — R410 Disorientation, unspecified: Secondary | ICD-10-CM | POA: Diagnosis not present

## 2018-04-29 DIAGNOSIS — E1100 Type 2 diabetes mellitus with hyperosmolarity without nonketotic hyperglycemic-hyperosmolar coma (NKHHC): Secondary | ICD-10-CM | POA: Diagnosis not present

## 2018-04-29 DIAGNOSIS — Z0000 Encounter for general adult medical examination without abnormal findings: Secondary | ICD-10-CM | POA: Diagnosis not present

## 2018-05-06 DIAGNOSIS — E1100 Type 2 diabetes mellitus with hyperosmolarity without nonketotic hyperglycemic-hyperosmolar coma (NKHHC): Secondary | ICD-10-CM | POA: Diagnosis not present

## 2018-05-06 DIAGNOSIS — I10 Essential (primary) hypertension: Secondary | ICD-10-CM | POA: Diagnosis not present

## 2018-05-06 DIAGNOSIS — Z681 Body mass index (BMI) 19 or less, adult: Secondary | ICD-10-CM | POA: Diagnosis not present

## 2018-05-06 DIAGNOSIS — I70201 Unspecified atherosclerosis of native arteries of extremities, right leg: Secondary | ICD-10-CM | POA: Diagnosis not present

## 2018-05-22 DIAGNOSIS — E1151 Type 2 diabetes mellitus with diabetic peripheral angiopathy without gangrene: Secondary | ICD-10-CM | POA: Diagnosis not present

## 2018-05-22 DIAGNOSIS — I739 Peripheral vascular disease, unspecified: Secondary | ICD-10-CM | POA: Diagnosis not present

## 2018-05-22 DIAGNOSIS — L84 Corns and callosities: Secondary | ICD-10-CM | POA: Diagnosis not present

## 2018-05-22 DIAGNOSIS — L603 Nail dystrophy: Secondary | ICD-10-CM | POA: Diagnosis not present

## 2018-08-05 DIAGNOSIS — R6889 Other general symptoms and signs: Secondary | ICD-10-CM | POA: Diagnosis not present

## 2018-08-05 DIAGNOSIS — E1169 Type 2 diabetes mellitus with other specified complication: Secondary | ICD-10-CM | POA: Diagnosis not present

## 2018-10-07 DIAGNOSIS — H40012 Open angle with borderline findings, low risk, left eye: Secondary | ICD-10-CM | POA: Diagnosis not present

## 2018-10-07 DIAGNOSIS — E113293 Type 2 diabetes mellitus with mild nonproliferative diabetic retinopathy without macular edema, bilateral: Secondary | ICD-10-CM | POA: Diagnosis not present

## 2018-10-07 DIAGNOSIS — Z961 Presence of intraocular lens: Secondary | ICD-10-CM | POA: Diagnosis not present

## 2018-10-07 DIAGNOSIS — H401210 Low-tension glaucoma, right eye, stage unspecified: Secondary | ICD-10-CM | POA: Diagnosis not present

## 2018-12-08 DIAGNOSIS — I10 Essential (primary) hypertension: Secondary | ICD-10-CM | POA: Diagnosis not present

## 2018-12-08 DIAGNOSIS — E1169 Type 2 diabetes mellitus with other specified complication: Secondary | ICD-10-CM | POA: Diagnosis not present

## 2018-12-08 DIAGNOSIS — I480 Paroxysmal atrial fibrillation: Secondary | ICD-10-CM | POA: Diagnosis not present

## 2018-12-08 DIAGNOSIS — M130 Polyarthritis, unspecified: Secondary | ICD-10-CM | POA: Diagnosis not present

## 2018-12-08 DIAGNOSIS — I509 Heart failure, unspecified: Secondary | ICD-10-CM | POA: Diagnosis not present

## 2018-12-08 DIAGNOSIS — I6789 Other cerebrovascular disease: Secondary | ICD-10-CM | POA: Diagnosis not present

## 2018-12-08 DIAGNOSIS — R6889 Other general symptoms and signs: Secondary | ICD-10-CM | POA: Diagnosis not present

## 2018-12-08 DIAGNOSIS — E785 Hyperlipidemia, unspecified: Secondary | ICD-10-CM | POA: Diagnosis not present

## 2019-01-22 ENCOUNTER — Telehealth: Payer: Self-pay | Admitting: Cardiology

## 2019-01-22 NOTE — Telephone Encounter (Signed)
New Message   *STAT* If patient is at the pharmacy, call can be transferred to refill team.   1. Which medications need to be refilled? (please list name of each medication and dose if known) linagliptin (TRADJENTA) 5 MG TABS tablet  2. Which pharmacy/location (including street and city if local pharmacy) is medication to be sent to? [place], [place]  3. Do they need a 30 day or 90 day supply? 90 day

## 2019-01-22 NOTE — Telephone Encounter (Signed)
° °  Patient calling to update pharmacy..Please use [place] [phone]

## 2019-01-22 NOTE — Telephone Encounter (Signed)
Unable to update pharmacy [facility] is not listed due to pharmacy being pt assistance program.

## 2019-01-25 NOTE — Telephone Encounter (Signed)
Spoke with patient and explained that Dr. Stanford Breed will not be refilling tradjenta (as noted per 05/2017 refill). She reports her PCP office refilled the med to [facility] and she cannot pay for this. She needs her medication to be supplied from [employer]. She reports her PCP office told her that they could not get thru to [place] on the phone. Patient does report the wait for [facility] is 20+ minutes. Suggested that patient call [facility] and request that a fax be sent to Dr. Fransico Setters office requesting a refill of this medication. Patient asked that I call Dr. Fransico Setters office to provide them with the fax # to [facility]. Spoke with PCP office and they have the phone & fax for [facility], waiting on MD prescription (Dr. Fransico Setters nurse was out of office Friday 10/9)

## 2019-01-25 NOTE — Telephone Encounter (Signed)
° ° °  Patient is calling again to request prescription be sent to [facility].  Patient has 5 pills remaining .   How can we assist patient getting her medication. Please call patient

## 2019-01-25 NOTE — Telephone Encounter (Signed)
Routed to MD to advise on refill of diabetic medication

## 2019-01-25 NOTE — Telephone Encounter (Signed)
Primary care Jaylin Benzel  

## 2019-02-01 DIAGNOSIS — Z1231 Encounter for screening mammogram for malignant neoplasm of breast: Secondary | ICD-10-CM | POA: Diagnosis not present

## 2019-02-01 DIAGNOSIS — R6889 Other general symptoms and signs: Secondary | ICD-10-CM | POA: Diagnosis not present

## 2019-02-01 DIAGNOSIS — Z803 Family history of malignant neoplasm of breast: Secondary | ICD-10-CM | POA: Diagnosis not present

## 2019-02-22 ENCOUNTER — Other Ambulatory Visit: Payer: Self-pay

## 2019-02-22 DIAGNOSIS — I779 Disorder of arteries and arterioles, unspecified: Secondary | ICD-10-CM

## 2019-02-24 ENCOUNTER — Ambulatory Visit (INDEPENDENT_AMBULATORY_CARE_PROVIDER_SITE_OTHER): Payer: Medicare HMO | Admitting: Family

## 2019-02-24 ENCOUNTER — Encounter: Payer: Self-pay | Admitting: Family

## 2019-02-24 ENCOUNTER — Ambulatory Visit (HOSPITAL_COMMUNITY)
Admission: RE | Admit: 2019-02-24 | Discharge: 2019-02-24 | Disposition: A | Discharge status: Home / Self Care | Payer: Medicare HMO | Source: Ambulatory Visit | Attending: Family | Admitting: Family

## 2019-02-24 ENCOUNTER — Other Ambulatory Visit: Payer: Self-pay

## 2019-02-24 VITALS — BP 141/72 | HR 72 | Temp 97.9°F | Resp 18 | Ht 59.5 in | Wt 92.0 lb

## 2019-02-24 DIAGNOSIS — E1151 Type 2 diabetes mellitus with diabetic peripheral angiopathy without gangrene: Secondary | ICD-10-CM | POA: Diagnosis not present

## 2019-02-24 DIAGNOSIS — I779 Disorder of arteries and arterioles, unspecified: Secondary | ICD-10-CM

## 2019-02-24 DIAGNOSIS — Z9889 Other specified postprocedural states: Secondary | ICD-10-CM | POA: Diagnosis not present

## 2019-02-24 DIAGNOSIS — R6889 Other general symptoms and signs: Secondary | ICD-10-CM | POA: Diagnosis not present

## 2019-02-24 NOTE — Progress Notes (Signed)
[facility]    CC: Follow up peripheral artery occlusive disease  History of Present Illness [name] is a 85 y.o. female who is s/p left above knee to below knee popliteal artery bypass graft on 07/23/10 by Dr. Darrick Penna, and her bypass has been occluded since 2012.  She denies claudication symptoms with walking, denies non healing wounds. She reports stiffness in left foot and ankle since 2012. She had a stroke in 1999 as manifested by expressive and possibly receptive aphasia, denies hemiparesis, denies monocular loss of vision, denies any stroke or TIA activity since then; noted history of atrial fib. She is right hand dominant.  She walks for2hourscontinuously in her house daily. She also does push ups, and other exercises daily. She states her appetite is good.  Diabetic: Yes, last A1C result on file was 8.0 (November 2015, review of records), uncontrolled, pt statesmost recently 7.1 Tobacco use: non-smoker  Pt meds include: Statin :Yes Betablocker: Yes ASA: No Other anticoagulants/antiplatelets: Pradaxa, for atrial fib, Eliquis is also on her medication list, but she states she is not taking Eliquis     Past Medical History:  Diagnosis Date  . Atrial fibrillation (HCC)   . Diabetes mellitus   . DVT (deep venous thrombosis) (HCC)   . Hyperlipidemia   . Hypertension   . Leg pain   . Peripheral arterial disease (HCC)   . Stroke [facility]) 1999    Social History Social History   Tobacco Use  . Smoking status: Never Smoker  . Smokeless tobacco: Never Used  Substance Use Topics  . Alcohol use: No  . Drug use: No    Family History Family History  Problem Relation Age of Onset  . Hypertension Mother   . Heart attack Mother   . Diabetes Father   . Hypertension Father   . Stroke Father   . Stroke Sister   . Diabetes Sister   . Heart disease Sister        Before age 41-Aneurysm  . Dementia Sister   . Alzheimer's disease  Sister   . Diabetes Brother   . Diabetes Brother   . Heart disease Brother        Before age 53  . Heart attack Brother   . Heart attack Sister   . Diabetes Sister   . Alzheimer's disease Sister     Past Surgical History:  Procedure Laterality Date  . BACK SURGERY  1996  . CATARACT EXTRACTION  2000  . CESAREAN SECTION    . EYE SURGERY    . FEMORAL BYPASS Left July 23, 2010  . LUMBAR DISC SURGERY    . SPINE SURGERY      No Known Allergies  Current Outpatient Medications  Medication Sig Dispense Refill  . ACCU-CHEK SOFTCLIX LANCETS lancets     . amLODipine (NORVASC) 10 MG tablet TAKE 1 TABLET EVERY DAY 30 tablet 10  . atorvastatin (LIPITOR) 40 MG tablet TAKE 1 TABLET BY MOUTH ONCE A DAY 30 tablet 7  . carvedilol (COREG) 6.25 MG tablet Take 6.25 mg by mouth 2 (two) times daily with a meal.      . dabigatran (PRADAXA) 150 MG CAPS capsule Take 1 capsule (150 mg total) by mouth 2 (two) times daily. 180 capsule 3  . diclofenac sodium (VOLTAREN) 1 % GEL APPLY 2 INCHES TO EACH AFFECT JOINT 4 TIMES A DAY  5  . ELIQUIS 2.5 MG TABS tablet Take 2.5 mg by mouth daily.    Marland Kitchen  fish oil-omega-3 fatty acids 1000 MG capsule Take 1 g by mouth daily.      Marland Kitchen. glipiZIDE (GLUCOTROL) 10 MG tablet Take 10 mg by mouth 2 (two) times daily before a meal.      . Lancet Devices (TRUEDRAW LANCING DEVICE) MISC     . latanoprost (XALATAN) 0.005 % ophthalmic solution Place 1 drop into both eyes at bedtime.    Marland Kitchen. linagliptin (TRADJENTA) 5 MG TABS tablet Take 1 tablet (5 mg total) by mouth daily. 90 tablet 3  . lisinopril (PRINIVIL,ZESTRIL) 10 MG tablet TAKE 1 TABLET DAILY 30 tablet 0  . metFORMIN (GLUCOPHAGE) 500 MG tablet Take 500 mg by mouth 2 (two) times daily with a meal.      . Multiple Vitamins-Minerals (CENTRUM SILVER PO) Take 1 tablet by mouth 1 dose over 24 hours.      . TRUETEST TEST test strip      No current facility-administered medications for this visit.     ROS: See HPI for pertinent positives  and negatives.   Physical Examination  Vitals:   02/24/19 1204  BP: (!) 141/72  Pulse: 72  Resp: 18  Temp: 97.9 F (36.6 C)  SpO2: 97%  Weight: 92 lb (41.7 kg)  Height: 4' 11.5" (1.511 m)   Body mass index is 18.27 kg/m.  General: A&O x 3, WDWN, petite female. Gait: normal ENT: No gross abnormalities.  Pulmonary: Respirations are non labored, CTAB, good air movement in all fields Cardiac: Irregular rhythm with controlled rate, no detected murmur.         Carotid Bruits Right Left   Negative Negative   Radial pulses are 2+ palpable bilaterally   Adominal aortic pulse is not palpable                         VASCULAR EXAM: Extremities without ischemic changes, without Gangrene; without open wounds.                                                                                                          LE Pulses Right Left       FEMORAL  2+ palpable  2+ palpable        POPLITEAL  not palpable   not palpable       POSTERIOR TIBIAL  not palpable   not palpable        DORSALIS PEDIS      ANTERIOR TIBIAL 2+ palpable  not palpable    Abdomen: soft, NT, no palpable masses. Skin: no rashes, no cellulitis, no ulcers noted. Musculoskeletal: no muscle wasting or atrophy.  Neurologic: A&O X 3; appropriate affect, Sensation is normal; MOTOR FUNCTION:  moving all extremities equally, motor strength 5/5 throughout. Speech is fluent/normal. CN 2-12 intact. Psychiatric: Thought content is normal, mood appropriate for clinical situation.    DATA  ABI (Date: 02/24/2019): ABI Findings: +---------+------------------+-----+---------+----------------+ Right    Rt Pressure (mmHg)IndexWaveform Comment          +---------+------------------+-----+---------+----------------+ Brachial 141  triphasic                 +---------+------------------+-----+---------+----------------+ PTA      255               1.72  triphasicNon-Compressible +---------+------------------+-----+---------+----------------+ DP       255               1.72 triphasicNon-Compressible +---------+------------------+-----+---------+----------------+ Great Toe78                0.53 Abnormal                  +---------+------------------+-----+---------+----------------+  +---------+------------------+-----+----------+----------------+ Left     Lt Pressure (mmHg)IndexWaveform  Comment          +---------+------------------+-----+----------+----------------+ Brachial 148                    triphasic                  +---------+------------------+-----+----------+----------------+ PTA      255               1.72 biphasic  Non-Compressible +---------+------------------+-----+----------+----------------+ DP       254               1.72 monophasicNon-Compressible +---------+------------------+-----+----------+----------------+ Great Toe31                0.21 Abnormal                   +---------+------------------+-----+----------+----------------+  +-------+-----------+-----------+------------+------------+ ABI/TBIToday's ABIToday's TBIPrevious ABIPrevious TBI +-------+-----------+-----------+------------+------------+ Right  [place]         0.53       [place]          0.59         +-------+-----------+-----------+------------+------------+ Left   [place]         0.21       [place]          0.29         +-------+-----------+-----------+------------+------------+  Bilateral ABIs and TBIs appear essentially unchanged compared to prior study on 02/24/2018.   Summary: Right: Resting right ankle-brachial index indicates noncompressible right lower extremity arteries. The right toe-brachial index is abnormal. RT great toe pressure = 78 mmHg.  Left: Resting left ankle-brachial index indicates noncompressible left lower extremity arteries. The left toe-brachial index is abnormal. LT Great  toe pressure = 31 mmHg.  ASSESSMENT: [name] is a 85 y.o. female who is s/p left above knee to below knee popliteal artery bypass graft on 07/23/10, and her bypass has been occluded since 2012. She walks and exercises a great deal daily, has no claudication symptoms, has no non healing wounds. Her right DP pulse is 2+ palpable. She walks for exercise in her house 3 hours daily.   Her ankle arterial vessels are non compressible, likely from long term uncontrolled DM. Triphasic waveforms in the right ankle, Bi and monophasic in the left ankle.   Her atherosclerotic risk factors include DM, dyslipidemia, and hypertension.   Fortunately she has never used tobacco. She takes a statin and Pradaxa.     PLAN:  Based on the patient's vascular studies and examination, pt will return to clinic in 1 year with ABI's.  She knows to notify us if she develops concerns re the circulation in her feet/legs.   Continue extensive walking.   I discussed in depth with the patient the nature of atherosclerosis, and emphasized the importance of maximal medical management including strict control of blood pressure,  blood glucose, and lipid levels, obtaining regular exercise, and continued cessation of smoking.  The patient is aware that without maximal medical management the underlying atherosclerotic disease process will progress, limiting the benefit of any interventions.  The patient was given information about PAD including signs, symptoms, treatment, what symptoms should prompt the patient to seek immediate medical care, and risk reduction measures to take.  Charisse March, RN, MSN, FNP-C [facility] Phone: [phone]  Clinic MD: [facility] vein clinic  02/24/19 12:36 PM

## 2019-02-24 NOTE — Patient Instructions (Signed)
Peripheral Vascular Disease  Peripheral vascular disease (PVD) is a disease of the blood vessels that are not part of your heart and brain. A simple term for PVD is poor circulation. In most cases, PVD narrows the blood vessels that carry blood from your heart to the rest of your body. This can reduce the supply of blood to your arms, legs, and internal organs, like your stomach or kidneys. However, PVD most often affects a person's lower legs and feet. Without treatment, PVD tends to get worse. PVD can also lead to acute ischemic limb. This is when an arm or leg suddenly cannot get enough blood. This is a medical emergency. Follow these instructions at home: Lifestyle  Do not use any products that contain nicotine or tobacco, such as cigarettes and e-cigarettes. If you need help quitting, ask your doctor.  Lose weight if you are overweight. Or, stay at a healthy weight as told by your doctor.  Eat a diet that is low in fat and cholesterol. If you need help, ask your doctor.  Exercise regularly. Ask your doctor for activities that are right for you. General instructions  Take over-the-counter and prescription medicines only as told by your doctor.  Take good care of your feet: ? Wear comfortable shoes that fit well. ? Check your feet often for any cuts or sores.  Keep all follow-up visits as told by your doctor This is important. Contact a doctor if:  You have cramps in your legs when you walk.  You have leg pain when you are at rest.  You have coldness in a leg or foot.  Your skin changes.  You are unable to get or have an erection (erectile dysfunction).  You have cuts or sores on your feet that do not heal. Get help right away if:  Your arm or leg turns cold, numb, and blue.  Your arms or legs become red, warm, swollen, painful, or numb.  You have chest pain.  You have trouble breathing.  You suddenly have weakness in your face, arm, or leg.  You become very  confused or you cannot speak.  You suddenly have a very bad headache.  You suddenly cannot see. Summary  Peripheral vascular disease (PVD) is a disease of the blood vessels.  A simple term for PVD is poor circulation. Without treatment, PVD tends to get worse.  Treatment may include exercise, low fat and low cholesterol diet, and quitting smoking. This information is not intended to replace advice given to you by your health care provider. Make sure you discuss any questions you have with your health care provider. Document Released: 06/26/2009 Document Revised: 03/14/2017 Document Reviewed: 05/09/2016 Elsevier Patient Education  2020 Elsevier Inc.  

## 2019-03-02 ENCOUNTER — Other Ambulatory Visit: Payer: Self-pay

## 2019-03-02 DIAGNOSIS — I779 Disorder of arteries and arterioles, unspecified: Secondary | ICD-10-CM

## 2019-03-29 DIAGNOSIS — H40012 Open angle with borderline findings, low risk, left eye: Secondary | ICD-10-CM | POA: Diagnosis not present

## 2019-03-29 DIAGNOSIS — R6889 Other general symptoms and signs: Secondary | ICD-10-CM | POA: Diagnosis not present

## 2019-03-29 DIAGNOSIS — Z961 Presence of intraocular lens: Secondary | ICD-10-CM | POA: Diagnosis not present

## 2019-03-29 DIAGNOSIS — E113293 Type 2 diabetes mellitus with mild nonproliferative diabetic retinopathy without macular edema, bilateral: Secondary | ICD-10-CM | POA: Diagnosis not present

## 2019-03-29 DIAGNOSIS — H401210 Low-tension glaucoma, right eye, stage unspecified: Secondary | ICD-10-CM | POA: Diagnosis not present

## 2019-04-12 ENCOUNTER — Other Ambulatory Visit: Payer: Self-pay | Admitting: Cardiology

## 2019-04-12 DIAGNOSIS — I480 Paroxysmal atrial fibrillation: Secondary | ICD-10-CM

## 2019-04-12 NOTE — Telephone Encounter (Signed)
    *  STAT* If patient is at the pharmacy, call can be transferred to refill team.   1. Which medications need to be refilled? (please list name of each medication and dose if known) dabigatran (PRADAXA) 150 MG CAPS capsule  2. Which pharmacy/location (including street and city if local pharmacy) is medication to be sent to? [place], Phone [phone]  3. Do they need a 30 day or 90 day supply? [place]

## 2019-04-14 ENCOUNTER — Other Ambulatory Visit: Payer: Self-pay

## 2019-04-14 DIAGNOSIS — L603 Nail dystrophy: Secondary | ICD-10-CM | POA: Diagnosis not present

## 2019-04-14 DIAGNOSIS — I4811 Longstanding persistent atrial fibrillation: Secondary | ICD-10-CM

## 2019-04-14 DIAGNOSIS — E1151 Type 2 diabetes mellitus with diabetic peripheral angiopathy without gangrene: Secondary | ICD-10-CM | POA: Diagnosis not present

## 2019-04-14 DIAGNOSIS — I739 Peripheral vascular disease, unspecified: Secondary | ICD-10-CM | POA: Diagnosis not present

## 2019-04-14 DIAGNOSIS — L84 Corns and callosities: Secondary | ICD-10-CM | POA: Diagnosis not present

## 2019-04-14 DIAGNOSIS — R6889 Other general symptoms and signs: Secondary | ICD-10-CM | POA: Diagnosis not present

## 2019-04-14 NOTE — Addendum Note (Signed)
Addended by: Diana Eves on: 04/14/2019 10:11 AM   Modules accepted: Orders

## 2019-04-14 NOTE — Telephone Encounter (Signed)
Called the pt and spoke w/them regarding overdue for labs and they stated that they are planning to have it done but have a transportation issue order placed

## 2019-04-20 ENCOUNTER — Telehealth: Payer: Self-pay

## 2019-04-20 ENCOUNTER — Telehealth (INDEPENDENT_AMBULATORY_CARE_PROVIDER_SITE_OTHER): Payer: Medicare HMO | Admitting: Physician Assistant

## 2019-04-20 ENCOUNTER — Encounter: Payer: Self-pay | Admitting: Physician Assistant

## 2019-04-20 VITALS — BP 127/38 | HR 94 | Temp 98.1°F | Ht 60.0 in | Wt 92.6 lb

## 2019-04-20 DIAGNOSIS — E119 Type 2 diabetes mellitus without complications: Secondary | ICD-10-CM

## 2019-04-20 DIAGNOSIS — I480 Paroxysmal atrial fibrillation: Secondary | ICD-10-CM

## 2019-04-20 DIAGNOSIS — Z8673 Personal history of transient ischemic attack (TIA), and cerebral infarction without residual deficits: Secondary | ICD-10-CM

## 2019-04-20 DIAGNOSIS — I10 Essential (primary) hypertension: Secondary | ICD-10-CM

## 2019-04-20 DIAGNOSIS — I4891 Unspecified atrial fibrillation: Secondary | ICD-10-CM | POA: Diagnosis not present

## 2019-04-20 DIAGNOSIS — Z79899 Other long term (current) drug therapy: Secondary | ICD-10-CM

## 2019-04-20 DIAGNOSIS — Z7901 Long term (current) use of anticoagulants: Secondary | ICD-10-CM

## 2019-04-20 DIAGNOSIS — E785 Hyperlipidemia, unspecified: Secondary | ICD-10-CM | POA: Diagnosis not present

## 2019-04-20 DIAGNOSIS — I739 Peripheral vascular disease, unspecified: Secondary | ICD-10-CM | POA: Diagnosis not present

## 2019-04-20 NOTE — Telephone Encounter (Signed)

## 2019-04-20 NOTE — Progress Notes (Signed)
Virtual Visit via Telephone Note   This visit type was conducted due to national recommendations for restrictions regarding the COVID-19 Pandemic (e.g. social distancing) in an effort to limit this patient's exposure and mitigate transmission in our community.  Due to her co-morbid illnesses, this patient is at least at moderate risk for complications without adequate follow up.  This format is felt to be most appropriate for this patient at this time.  The patient did not have access to video technology/had technical difficulties with video requiring transitioning to audio format only (telephone).  All issues noted in this document were discussed and addressed.  No physical exam could be performed with this format.  Please refer to the patient's chart for her  consent to telehealth for [facility].   Date:  04/22/2019   ID:  CERYS WINGET, DOB March 25, 1933, MRN [id number]  Patient Location: Home Provider Location: Office  PCP:  Lucianne Lei, MD  Cardiologist:  Kirk Ruths, MD Electrophysiologist:  None   Evaluation Performed:  Follow-Up Visit  Chief Complaint:  followup  History of Present Illness:    [name] is a 86 y.o. female with PMH of PAF on Pradaxa, HTN, HLD, DM II, h/o DVT, PAD s/p L above knee to below knee bypass 07/2010 and h/o CVA 1999.  She had a Myoview performed in May 2007 that showed EF 80%, normal perfusion.  Echocardiogram in July 2009 showed a normal LV function, mild LAE.  She has been followed by vascular surgery for her peripheral arterial disease.  She was previously on 2.5 mg twice daily of Eliquis, this was later switched to Pradaxa in December 2018 due to cost reasons. She is not on aspirin given the need for pradaxa.  I last saw the patient on 04/17/2017 at which time she was doing well.  Patient presents today for virtual visit.  She denies any recent chest pain or worsening shortness of breath.  She is quite active at home without any exertional  symptoms.  Although diastolic blood pressure is low and heart rate is borderline high today, I question the accuracy of this home reading.  Based on the last vascular surgery office note, her blood pressure and heart rate were stable.  She does not have any cardiac awareness of the atrial fibrillation.  She has kept her self isolated during the pandemic and avoided crowded places.  Fortunately, she has not had any sign of infection recently.  We will refill her Pradaxa and have her return for follow-up in 9 to 12 months.  She is overdue for EKG, however recent heart rate during the last office visit is well controlled.  She is protected from stroke on the Pradaxa, we will obtain EKG later this year when she returns.  The patient does not have symptoms concerning for COVID-19 infection (fever, chills, cough, or new shortness of breath).    Past Medical History:  Diagnosis Date  . Atrial fibrillation ([place])   . Diabetes mellitus   . DVT (deep venous thrombosis) ([place])   . Hyperlipidemia   . Hypertension   . Leg pain   . Peripheral arterial disease ([place])   . Stroke [facility]) 1999   Past Surgical History:  Procedure Laterality Date  . BACK SURGERY  1996  . CATARACT EXTRACTION  2000  . CESAREAN SECTION    . EYE SURGERY    . FEMORAL BYPASS Left July 23, 2010  . LUMBAR DISC SURGERY    . SPINE SURGERY  Current Meds  Medication Sig  . ACCU-CHEK SOFTCLIX LANCETS lancets   . amLODipine (NORVASC) 10 MG tablet TAKE 1 TABLET EVERY DAY  . atorvastatin (LIPITOR) 40 MG tablet TAKE 1 TABLET BY MOUTH ONCE A DAY  . carvedilol (COREG) 6.25 MG tablet Take 6.25 mg by mouth 2 (two) times daily with a meal.    . dabigatran (PRADAXA) 150 MG CAPS capsule Take 1 capsule (150 mg total) by mouth 2 (two) times daily.  . diclofenac sodium (VOLTAREN) 1 % GEL APPLY 2 INCHES TO EACH AFFECT JOINT 4 TIMES A DAY  . fish oil-omega-3 fatty acids 1000 MG capsule Take 1 g by mouth daily.    Marland Kitchen glipiZIDE (GLUCOTROL) 10 MG  tablet Take 10 mg by mouth 2 (two) times daily before a meal.    . latanoprost (XALATAN) 0.005 % ophthalmic solution Place 1 drop into both eyes at bedtime.  Marland Kitchen linagliptin (TRADJENTA) 5 MG TABS tablet Take 1 tablet (5 mg total) by mouth daily.  Marland Kitchen lisinopril (PRINIVIL,ZESTRIL) 10 MG tablet TAKE 1 TABLET DAILY  . metFORMIN (GLUCOPHAGE) 500 MG tablet Take 500 mg by mouth 2 (two) times daily with a meal.    . Multiple Vitamins-Minerals (CENTRUM SILVER PO) Take 1 tablet by mouth 1 dose over 24 hours.    . TRUETEST TEST test strip      Allergies:   Patient has no known allergies.   Social History   Tobacco Use  . Smoking status: Never Smoker  . Smokeless tobacco: Never Used  Substance Use Topics  . Alcohol use: No  . Drug use: No     Family Hx: The patient's family history includes Alzheimer's disease in her sister and sister; Dementia in her sister; Diabetes in her brother, brother, father, sister, and sister; Heart attack in her brother, mother, and sister; Heart disease in her brother and sister; Hypertension in her father and mother; Stroke in her father and sister.  ROS:   Please see the history of present illness.     All other systems reviewed and are negative.   Prior CV studies:   The following studies were reviewed today:  Echo 10/28/2007 SUMMARY - Overall left ventricular systolic function was normal. Left    ventricular ejection fraction was estimated to be 60 %. There    was mild focal basal septal hypertrophy. - The aortic valve was mildly calcified. - The left atrium was mildly dilated. - Poor image quality No obvious source of embolus. TEE if    clinically indicated would be more sensitive given poor image    quality  IMPRESSIONS - Poor image quality - No obvious source of embolus. TEE if clinically indicated would    be more sensitive given poor image quality  Labs/Other Tests and Data Reviewed:    EKG:  An ECG dated  04/17/2017 was personally reviewed today and demonstrated:  Normal sinus rhythm with PACs.  Recent Labs: No results found for requested labs within last 8760 hours.   Recent Lipid Panel Lab Results  Component Value Date/Time   CHOL 117 03/21/2016 11:16 AM   TRIG 65 03/21/2016 11:16 AM   HDL 44 (L) 03/21/2016 11:16 AM   CHOLHDL 2.7 03/21/2016 11:16 AM   LDLCALC 60 03/21/2016 11:16 AM    Wt Readings from Last 3 Encounters:  04/20/19 92 lb 9.6 oz (42 kg)  02/24/19 92 lb (41.7 kg)  02/24/18 96 lb (43.5 kg)     Objective:    Vital Signs:  BP Marland Kitchen)  127/38   Pulse 94   Temp 98.1 F (36.7 C) (Oral)   Ht 5' (1.524 m)   Wt 92 lb 9.6 oz (42 kg)   BMI 18.08 kg/m    VITAL SIGNS:  reviewed  ASSESSMENT & PLAN:    1. PAF: On Pradaxa and carvedilol.  Last EKG was obtained in January 2019, she is overdue for repeat EKG, however given the asymptomatic nature, we will plan to obtain on the next office visit  2. Hypertension: The blood pressure that was provided to was was 127/38, diastolic blood pressure seems to be quite low however patient is asymptomatic.  She just had a office visit with vascular surgery November, blood pressure at that time showed normal diastolic pressure.  Therefore I question the accuracy of today's reading.  3. Hyperlipidemia: Continue Lipitor  4. DM2: Managed by primary care provider  5. PAD: Followed by vascular surgery  6. History of CVA: No recent recurrence  COVID-19 Education: The signs and symptoms of COVID-19 were discussed with the patient and how to seek care for testing (follow up with PCP or arrange E-visit).  The importance of social distancing was discussed today.  Time:   Today, I have spent 12 minutes with the patient with telehealth technology discussing the above problems.     Medication Adjustments/Labs and Tests Ordered: Current medicines are reviewed at length with the patient today.  Concerns regarding medicines are outlined above.    Tests Ordered: No orders of the defined types were placed in this encounter.   Medication Changes: No orders of the defined types were placed in this encounter.   Follow Up:  In Person in [address]  Signed, Azalee Course, [place]  04/22/2019 8:14 AM    [facility]

## 2019-04-20 NOTE — Patient Instructions (Signed)
Medication Instructions:  Your physician recommends that you continue on your current medications as directed. Please refer to the Current Medication list given to you today. *If you need a refill on your cardiac medications before your next appointment, please call your pharmacy*  Lab Work: NONE  If you have labs (blood work) drawn today and your tests are completely normal, you will receive your results only by: Marland Kitchen MyChart Message (if you have MyChart) OR . A paper copy in the mail If you have any lab test that is abnormal or we need to change your treatment, we will call you to review the results.  Testing/Procedures: NONE   Follow-Up: At [facility], you and your health needs are our priority.  As part of our continuing mission to provide you with exceptional heart care, we have created designated Provider Care Teams.  These Care Teams include your primary Cardiologist (physician) and Advanced Practice Providers (APPs -  Physician Assistants and Nurse Practitioners) who all work together to provide you with the care you need, when you need it.  Your next appointment:   9-12 month(s)  The format for your next appointment:   In Person  Provider:   Olga Millers, MD  Other Instructions

## 2019-04-20 NOTE — Telephone Encounter (Signed)
Contacted patient to discuss AVS Instructions. Gave patient [name] recommendations from today's virtual office visit. Informed patient that someone from the scheduling dept will be in contact with them to schedule their follow up appt. Patient voiced understanding and AVS mailed.

## 2019-04-20 NOTE — Telephone Encounter (Signed)
Called patient to let her know that I was mailing her the patient assistance form for Pradaxa and a lab slip. Lab must be completed for additional refills of Pradaxa (per Pharmacy). But I also wanted to know if she getting her Pradaxa at [facility]. Waiting for patient to contact office.

## 2019-04-21 NOTE — Telephone Encounter (Signed)
HAO REMINED PT AT YESTERDAYS APPT THAT LAB WORK IS NEEDED FOR A REFILL

## 2019-04-22 ENCOUNTER — Encounter: Payer: Self-pay | Admitting: Physician Assistant

## 2019-04-22 ENCOUNTER — Ambulatory Visit: Payer: Medicare HMO | Admitting: Physician Assistant

## 2019-04-22 ENCOUNTER — Other Ambulatory Visit: Payer: Self-pay

## 2019-04-22 VITALS — BP 146/70 | HR 97 | Temp 97.3°F | Ht 60.0 in | Wt 95.0 lb

## 2019-04-22 DIAGNOSIS — E785 Hyperlipidemia, unspecified: Secondary | ICD-10-CM | POA: Diagnosis not present

## 2019-04-22 DIAGNOSIS — Z8673 Personal history of transient ischemic attack (TIA), and cerebral infarction without residual deficits: Secondary | ICD-10-CM | POA: Diagnosis not present

## 2019-04-22 DIAGNOSIS — I480 Paroxysmal atrial fibrillation: Secondary | ICD-10-CM

## 2019-04-22 DIAGNOSIS — I10 Essential (primary) hypertension: Secondary | ICD-10-CM | POA: Diagnosis not present

## 2019-04-22 DIAGNOSIS — E119 Type 2 diabetes mellitus without complications: Secondary | ICD-10-CM

## 2019-04-22 DIAGNOSIS — I739 Peripheral vascular disease, unspecified: Secondary | ICD-10-CM | POA: Diagnosis not present

## 2019-04-22 DIAGNOSIS — Z79899 Other long term (current) drug therapy: Secondary | ICD-10-CM | POA: Diagnosis not present

## 2019-04-22 MED ORDER — CARVEDILOL 12.5 MG PO TABS: 12.5000 mg | ORAL_TABLET | Freq: Two times a day (BID) | ORAL | 2 refills | Status: DC

## 2019-04-22 MED ORDER — CARVEDILOL 12.5 MG PO TABS: 12.5000 mg | ORAL_TABLET | Freq: Two times a day (BID) | ORAL | 6 refills | Status: DC

## 2019-04-22 NOTE — Progress Notes (Signed)
Cardiology Office Note:    Date:  04/24/2019   ID:  [name], DOB 06-13-1932, MRN [id number]  PCP:  Lucianne Lei, MD  Cardiologist:  Kirk Ruths, MD  Electrophysiologist:  None   Referring MD: Lucianne Lei, MD   Chief Complaint  Patient presents with  . Follow-up    seen for Dr. Stanford Breed    History of Present Illness:    [name] is a 86 y.o. female with a hx of PAF on Pradaxa, HTN, HLD, DM II, h/o DVT, PAD s/p L above knee to below knee bypass 07/2010 and h/o CVA1999.She had a Myoview performed in May 2007 that showed EF 80%, normal perfusion. Echocardiogram in July 2009 showed a normal LV function, mild LAE. She has been followed by vascular surgery for her peripheral arterial disease. She was previously on 2.5 mg twice daily of Eliquis, this was later switched to Pradaxa in December 2018 due to cost reasons. She is not on aspirin given the need for pradaxa.  Patient presents back for blood pressure recheck.  Blood pressure was 146/70.  Heart rate borderline elevated at 97.  Given persistently elevated borderline blood pressure, I opted to increase carvedilol to 12.5 mg twice daily.  I gave her information to help sign up for the Covid vaccine shots.  Otherwise she does not have any lower extremity edema, orthopnea, PND or chest discomfort.  We will obtain CBC and a basic metabolic panel and to help her to apply for medication assistance program for the Pradaxa.  Otherwise we will see her back in 9 to 12 months.   Past Medical History:  Diagnosis Date  . Atrial fibrillation ([place])   . Diabetes mellitus   . DVT (deep venous thrombosis) ([place])   . Hyperlipidemia   . Hypertension   . Leg pain   . Peripheral arterial disease ([place])   . Stroke [facility]) 1999    Past Surgical History:  Procedure Laterality Date  . BACK SURGERY  1996  . CATARACT EXTRACTION  2000  . CESAREAN SECTION    . EYE SURGERY    . FEMORAL BYPASS Left July 23, 2010  . LUMBAR DISC SURGERY     . SPINE SURGERY      Current Medications: Current Meds  Medication Sig  . ACCU-CHEK SOFTCLIX LANCETS lancets   . amLODipine (NORVASC) 10 MG tablet TAKE 1 TABLET EVERY DAY  . atorvastatin (LIPITOR) 40 MG tablet TAKE 1 TABLET BY MOUTH ONCE A DAY  . carvedilol (COREG) 12.5 MG tablet Take 1 tablet (12.5 mg total) by mouth 2 (two) times daily with a meal.  . diclofenac sodium (VOLTAREN) 1 % GEL APPLY 2 INCHES TO EACH AFFECT JOINT 4 TIMES A DAY  . fish oil-omega-3 fatty acids 1000 MG capsule Take 1 g by mouth daily.    Marland Kitchen glipiZIDE (GLUCOTROL) 10 MG tablet Take 10 mg by mouth 2 (two) times daily before a meal.    . latanoprost (XALATAN) 0.005 % ophthalmic solution Place 1 drop into both eyes at bedtime.  Marland Kitchen linagliptin (TRADJENTA) 5 MG TABS tablet Take 1 tablet (5 mg total) by mouth daily.  Marland Kitchen lisinopril (PRINIVIL,ZESTRIL) 10 MG tablet TAKE 1 TABLET DAILY  . metFORMIN (GLUCOPHAGE) 500 MG tablet Take 500 mg by mouth 2 (two) times daily with a meal.    . Multiple Vitamins-Minerals (CENTRUM SILVER PO) Take 1 tablet by mouth 1 dose over 24 hours.    . TRUETEST TEST test strip   . [DISCONTINUED]  carvedilol (COREG) 12.5 MG tablet Take 1 tablet (12.5 mg total) by mouth 2 (two) times daily with a meal.  . [DISCONTINUED] carvedilol (COREG) 6.25 MG tablet Take 6.25 mg by mouth 2 (two) times daily with a meal.    . [DISCONTINUED] dabigatran (PRADAXA) 150 MG CAPS capsule Take 1 capsule (150 mg total) by mouth 2 (two) times daily.     Allergies:   Patient has no known allergies.   Social History   Socioeconomic History  . Marital status: Widowed    Spouse name: Not on file  . Number of children: Not on file  . Years of education: Not on file  . Highest education level: Not on file  Occupational History  . Not on file  Tobacco Use  . Smoking status: Never Smoker  . Smokeless tobacco: Never Used  Substance and Sexual Activity  . Alcohol use: No  . Drug use: No  . Sexual activity: Not on file   Other Topics Concern  . Not on file  Social History Narrative  . Not on file   Social Determinants of Health   Financial Resource Strain:   . Difficulty of Paying Living Expenses: Not on file  Food Insecurity:   . Worried About Programme researcher, broadcasting/film/video in the Last Year: Not on file  . Ran Out of Food in the Last Year: Not on file  Transportation Needs:   . Lack of Transportation (Medical): Not on file  . Lack of Transportation (Non-Medical): Not on file  Physical Activity:   . Days of Exercise per Week: Not on file  . Minutes of Exercise per Session: Not on file  Stress:   . Feeling of Stress : Not on file  Social Connections:   . Frequency of Communication with Friends and Family: Not on file  . Frequency of Social Gatherings with Friends and Family: Not on file  . Attends Religious Services: Not on file  . Active Member of Clubs or Organizations: Not on file  . Attends Banker Meetings: Not on file  . Marital Status: Not on file     Family History: The patient's family history includes Alzheimer's disease in her sister and sister; Dementia in her sister; Diabetes in her brother, brother, father, sister, and sister; Heart attack in her brother, mother, and sister; Heart disease in her brother and sister; Hypertension in her father and mother; Stroke in her father and sister.  ROS:   Please see the history of present illness.     All other systems reviewed and are negative.  EKGs/Labs/Other Studies Reviewed:    The following studies were reviewed today:  ABI 02/24/2019 Bilateral ABIs and TBIs appear essentially unchanged compared to prior study on 02/24/2018.   Summary: Right: Resting right ankle-brachial index indicates noncompressible right lower extremity arteries. The right toe-brachial index is abnormal. RT great toe pressure = 78 mmHg.  Left: Resting left ankle-brachial index indicates noncompressible left lower extremity arteries. The left toe-brachial  index is abnormal. LT Great toe pressure = 31 mmHg.  EKG:  EKG is ordered today.  The ekg ordered today demonstrates normal sinus rhythm with PACs  Recent Labs: 04/22/2019: BUN 17; Creatinine, Ser 0.82; Hemoglobin 11.7; Platelets 255; Potassium 4.6; Sodium 140  Recent Lipid Panel    Component Value Date/Time   CHOL 117 03/21/2016 1116   TRIG 65 03/21/2016 1116   HDL 44 (L) 03/21/2016 1116   CHOLHDL 2.7 03/21/2016 1116   VLDL 13 03/21/2016 1116  LDLCALC 60 03/21/2016 1116    Physical Exam:    VS:  BP (!) 146/70   Pulse 97   Temp (!) 97.3 F (36.3 C) (Temporal)   Ht 5' (1.524 m)   Wt 95 lb (43.1 kg)   SpO2 97%   BMI 18.55 kg/m     Wt Readings from Last 3 Encounters:  04/22/19 95 lb (43.1 kg)  04/20/19 92 lb 9.6 oz (42 kg)  02/24/19 92 lb (41.7 kg)     GEN:  Well nourished, well developed in no acute distress HEENT: Normal NECK: No JVD; No carotid bruits LYMPHATICS: No lymphadenopathy CARDIAC: RRR, no murmurs, rubs, gallops RESPIRATORY:  Clear to auscultation without rales, wheezing or rhonchi  ABDOMEN: Soft, non-tender, non-distended MUSCULOSKELETAL:  No edema; No deformity  SKIN: Warm and dry NEUROLOGIC:  Alert and oriented x 3 PSYCHIATRIC:  Normal affect   ASSESSMENT:    1. Paroxysmal atrial fibrillation (HCC)   2. Essential hypertension, benign   3. Hyperlipidemia LDL goal <70   4. Controlled type 2 diabetes mellitus without complication, without long-term current use of insulin (HCC)   5. PAD (peripheral artery disease) (HCC)   6. H/O: CVA (cerebrovascular accident)    PLAN:    In order of problems listed above:  1. Paroxysmal atrial fibrillation: On Pradaxa and carvedilol. We are trying to get her medication assistance on Pradaxa. Obtain CBC and basic metabolic panel  2. Hypertension: Blood pressure mildly elevated, increase carvedilol to 12.5 mg twice daily  3. Hyperlipidemia: Continue Lipitor  4. DM2: Managed by primary care provider. On  glipizide  5. PAD: Last ABI obtained in November 2020 was stable  6. History of CVA: No recurrence.   Medication Adjustments/Labs and Tests Ordered: Current medicines are reviewed at length with the patient today.  Concerns regarding medicines are outlined above.  Orders Placed This Encounter  Procedures  . CBC with Differential  . EKG 12-Lead   Meds ordered this encounter  Medications  . DISCONTD: carvedilol (COREG) 12.5 MG tablet    Sig: Take 1 tablet (12.5 mg total) by mouth 2 (two) times daily with a meal.    Dispense:  30 tablet    Refill:  6    New dosage  . carvedilol (COREG) 12.5 MG tablet    Sig: Take 1 tablet (12.5 mg total) by mouth 2 (two) times daily with a meal.    Dispense:  180 tablet    Refill:  2    New dosage    Patient Instructions  Medication Instructions:  INCREASE Coreg to 12.5mg  Take 1 tablet twice a day or you can take two (2) 6.25mg  tablets twice a day  *If you need a refill on your cardiac medications before your next appointment, please call your pharmacy*  Lab Work: Your physician recommends that you return for lab work in: TODAY CBC AND BMET If you have labs (blood work) drawn today and your tests are completely normal, you will receive your results only by: Marland Kitchen MyChart Message (if you have MyChart) OR . A paper copy in the mail If you have any lab test that is abnormal or we need to change your treatment, we will call you to review the results.  Testing/Procedures: NONE   Follow-Up: At [facility], you and your health needs are our priority.  As part of our continuing mission to provide you with exceptional heart care, we have created designated Provider Care Teams.  These Care Teams include your primary Cardiologist (  physician) and Advanced Practice Providers (APPs -  Physician Assistants and Nurse Practitioners) who all work together to provide you with the care you need, when you need it.  Your next appointment:   9-12 month(s)  The  format for your next appointment:   In Person  Provider:   Olga Millers, MD  Other Instructions     Signed, Azalee Course, PA  04/24/2019 11:28 PM    [facility]

## 2019-04-22 NOTE — Telephone Encounter (Addendum)
Patient here in office today with appt with [facility]. She will complete labs and take patient assistance forms.

## 2019-04-22 NOTE — Patient Instructions (Addendum)
Medication Instructions:  INCREASE Coreg to 12.5mg  Take 1 tablet twice a day or you can take two (2) 6.25mg  tablets twice a day  *If you need a refill on your cardiac medications before your next appointment, please call your pharmacy*  Lab Work: Your physician recommends that you return for lab work in: TODAY CBC AND BMET If you have labs (blood work) drawn today and your tests are completely normal, you will receive your results only by: Marland Kitchen MyChart Message (if you have MyChart) OR . A paper copy in the mail If you have any lab test that is abnormal or we need to change your treatment, we will call you to review the results.  Testing/Procedures: NONE   Follow-Up: At [facility], you and your health needs are our priority.  As part of our continuing mission to provide you with exceptional heart care, we have created designated Provider Care Teams.  These Care Teams include your primary Cardiologist (physician) and Advanced Practice Providers (APPs -  Physician Assistants and Nurse Practitioners) who all work together to provide you with the care you need, when you need it.  Your next appointment:   9-12 month(s)  The format for your next appointment:   In Person  Provider:   Olga Millers, MD  Other Instructions

## 2019-04-23 LAB — CBC WITH DIFFERENTIAL/PLATELET
Basophils Absolute: 0 10*3/uL (ref 0.0–0.2)
Basos: 1 %
EOS (ABSOLUTE): 0.1 10*3/uL (ref 0.0–0.4)
Eos: 1 %
Hematocrit: 36.1 % (ref 34.0–46.6)
Hemoglobin: 11.7 g/dL (ref 11.1–15.9)
Immature Grans (Abs): 0 10*3/uL (ref 0.0–0.1)
Immature Granulocytes: 0 %
Lymphocytes Absolute: 1.7 10*3/uL (ref 0.7–3.1)
Lymphs: 30 %
MCH: 32 pg (ref 26.6–33.0)
MCHC: 32.4 g/dL (ref 31.5–35.7)
MCV: 99 fL — ABNORMAL HIGH (ref 79–97)
Monocytes Absolute: 0.9 10*3/uL (ref 0.1–0.9)
Monocytes: 16 %
Neutrophils Absolute: 2.8 10*3/uL (ref 1.4–7.0)
Neutrophils: 52 %
Platelets: 255 10*3/uL (ref 150–450)
RBC: 3.66 x10E6/uL — ABNORMAL LOW (ref 3.77–5.28)
RDW: 13.1 % (ref 11.7–15.4)
WBC: 5.5 10*3/uL (ref 3.4–10.8)

## 2019-04-23 LAB — BASIC METABOLIC PANEL
BUN/Creatinine Ratio: 21 (ref 12–28)
BUN: 17 mg/dL (ref 8–27)
CO2: 23 mmol/L (ref 20–29)
Calcium: 10.7 mg/dL — ABNORMAL HIGH (ref 8.7–10.3)
Chloride: 101 mmol/L (ref 96–106)
Creatinine, Ser: 0.82 mg/dL (ref 0.57–1.00)
GFR calc Af Amer: 76 mL/min/{1.73_m2} (ref >59)
GFR calc non Af Amer: 66 mL/min/{1.73_m2} (ref >59)
Glucose: 93 mg/dL (ref 65–99)
Potassium: 4.6 mmol/L (ref 3.5–5.2)
Sodium: 140 mmol/L (ref 134–144)

## 2019-04-23 MED ORDER — DABIGATRAN ETEXILATE MESYLATE 150 MG PO CAPS: 150.0000 mg | ORAL_CAPSULE | Freq: Two times a day (BID) | ORAL | 1 refills | Status: DC

## 2019-04-23 NOTE — Telephone Encounter (Signed)
86 F  43.1 kg, SCr 0.83 (1/21), CrCl 34.3,  LOV Meng 1/21

## 2019-04-24 ENCOUNTER — Encounter: Payer: Self-pay | Admitting: Physician Assistant

## 2019-04-26 ENCOUNTER — Telehealth: Payer: Self-pay | Admitting: Physician Assistant

## 2019-04-26 ENCOUNTER — Telehealth: Payer: Self-pay

## 2019-04-26 NOTE — Telephone Encounter (Addendum)
Left voice message for the patient to give the office a call back to discuss her recent lab results.  ----- Message from [place], [place] sent at 04/26/2019 10:28 AM EST ----- Red blood cell count good. Normal renal function and electrolyte. She should be able to tolerate current pradaxa without any issue.

## 2019-04-26 NOTE — Telephone Encounter (Signed)
Patient is returning phone call regarding results. Please advise.

## 2019-04-30 DIAGNOSIS — E785 Hyperlipidemia, unspecified: Secondary | ICD-10-CM | POA: Diagnosis not present

## 2019-04-30 DIAGNOSIS — E1169 Type 2 diabetes mellitus with other specified complication: Secondary | ICD-10-CM | POA: Diagnosis not present

## 2019-04-30 DIAGNOSIS — I70201 Unspecified atherosclerosis of native arteries of extremities, right leg: Secondary | ICD-10-CM | POA: Diagnosis not present

## 2019-04-30 DIAGNOSIS — R6889 Other general symptoms and signs: Secondary | ICD-10-CM | POA: Diagnosis not present

## 2019-04-30 DIAGNOSIS — E782 Mixed hyperlipidemia: Secondary | ICD-10-CM | POA: Diagnosis not present

## 2019-04-30 DIAGNOSIS — I739 Peripheral vascular disease, unspecified: Secondary | ICD-10-CM | POA: Diagnosis not present

## 2019-04-30 DIAGNOSIS — I10 Essential (primary) hypertension: Secondary | ICD-10-CM | POA: Diagnosis not present

## 2019-04-30 DIAGNOSIS — I6789 Other cerebrovascular disease: Secondary | ICD-10-CM | POA: Diagnosis not present

## 2019-04-30 DIAGNOSIS — I4891 Unspecified atrial fibrillation: Secondary | ICD-10-CM | POA: Diagnosis not present

## 2019-04-30 DIAGNOSIS — I480 Paroxysmal atrial fibrillation: Secondary | ICD-10-CM | POA: Diagnosis not present

## 2019-05-02 ENCOUNTER — Emergency Department (HOSPITAL_COMMUNITY)
Admission: EM | Admit: 2019-05-02 | Discharge: 2019-05-02 | Disposition: A | Discharge status: Home / Self Care | Payer: Medicare HMO | Attending: Emergency Medicine | Admitting: Emergency Medicine

## 2019-05-02 ENCOUNTER — Other Ambulatory Visit: Payer: Self-pay

## 2019-05-02 ENCOUNTER — Encounter (HOSPITAL_COMMUNITY): Payer: Self-pay | Admitting: Emergency Medicine

## 2019-05-02 DIAGNOSIS — Z951 Presence of aortocoronary bypass graft: Secondary | ICD-10-CM | POA: Diagnosis not present

## 2019-05-02 DIAGNOSIS — E1165 Type 2 diabetes mellitus with hyperglycemia: Secondary | ICD-10-CM | POA: Diagnosis not present

## 2019-05-02 DIAGNOSIS — Z7984 Long term (current) use of oral hypoglycemic drugs: Secondary | ICD-10-CM | POA: Insufficient documentation

## 2019-05-02 DIAGNOSIS — Z79899 Other long term (current) drug therapy: Secondary | ICD-10-CM | POA: Diagnosis not present

## 2019-05-02 DIAGNOSIS — R739 Hyperglycemia, unspecified: Secondary | ICD-10-CM

## 2019-05-02 DIAGNOSIS — I10 Essential (primary) hypertension: Secondary | ICD-10-CM | POA: Diagnosis not present

## 2019-05-02 LAB — BASIC METABOLIC PANEL
Anion gap: 10 (ref 5–15)
BUN: 20 mg/dL (ref 8–23)
CO2: 29 mmol/L (ref 22–32)
Calcium: 10.4 mg/dL — ABNORMAL HIGH (ref 8.9–10.3)
Chloride: 100 mmol/L (ref 98–111)
Creatinine, Ser: 0.89 mg/dL (ref 0.44–1.00)
GFR calc Af Amer: >60 mL/min (ref >60)
GFR calc non Af Amer: 60 mL/min — ABNORMAL LOW (ref >60)
Glucose, Bld: 219 mg/dL — ABNORMAL HIGH (ref 70–99)
Potassium: 4.6 mmol/L (ref 3.5–5.1)
Sodium: 139 mmol/L (ref 135–145)

## 2019-05-02 LAB — URINALYSIS, ROUTINE W REFLEX MICROSCOPIC
Bacteria, UA: NONE SEEN
Bilirubin Urine: NEGATIVE
Glucose, UA: >=500 mg/dL — AB
Hgb urine dipstick: NEGATIVE
Ketones, ur: NEGATIVE mg/dL
Leukocytes,Ua: NEGATIVE
Nitrite: NEGATIVE
Protein, ur: NEGATIVE mg/dL
Specific Gravity, Urine: 1.009 (ref 1.005–1.030)
pH: 7 (ref 5.0–8.0)

## 2019-05-02 LAB — CBC
HCT: 35.9 % — ABNORMAL LOW (ref 36.0–46.0)
Hemoglobin: 11.6 g/dL — ABNORMAL LOW (ref 12.0–15.0)
MCH: 32.1 pg (ref 26.0–34.0)
MCHC: 32.3 g/dL (ref 30.0–36.0)
MCV: 99.4 fL (ref 80.0–100.0)
Platelets: 249 10*3/uL (ref 150–400)
RBC: 3.61 MIL/uL — ABNORMAL LOW (ref 3.87–5.11)
RDW: 15.2 % (ref 11.5–15.5)
WBC: 4.7 10*3/uL (ref 4.0–10.5)
nRBC: 0 % (ref 0.0–0.2)

## 2019-05-02 LAB — CBG MONITORING, ED
Glucose-Capillary: 228 mg/dL — ABNORMAL HIGH (ref 70–99)
Glucose-Capillary: 159 mg/dL — ABNORMAL HIGH (ref 70–99)

## 2019-05-02 MED ORDER — SODIUM CHLORIDE 0.9 % IV BOLUS
1000.0000 mL | Freq: Once | INTRAVENOUS | Status: AC
  Administered 2019-05-02: 10:00:00 1000 mL via INTRAVENOUS

## 2019-05-02 NOTE — ED Notes (Signed)
An After Visit Summary was printed and given to the patient. Discharge instructions given and no further questions at this time. Pt states she is calling her ride. Pt A&Ox4 and ambulatory.

## 2019-05-02 NOTE — ED Provider Notes (Signed)
[facility]-EMERGENCY DEPT Provider Note   CSN: [id number] Arrival date & time: 05/02/19  2423     History Chief Complaint  Patient presents with  . Hyperglycemia    [name] is a 86 y.o. female with a past medical history of hypertension, hyperlipidemia, NIDDM, presenting to the ED with a chief complaint of hyperglycemia.  Woke up this morning and checked her blood sugar was found to be over 500.  She took a dose of all of her diabetes medications including Tradjenta, Metformin, glipizide with improvement in her blood sugars to the 200s.  She was told to call her clinic hotline when her blood sugars were high.  They told her to come to the ED.  She believes that she woke up with hypoglycemia due to eating a chocolate bar yesterday.  States that she otherwise feels okay, denies any vomiting, abdominal pain, cough, fever, dysuria, injuries or falls.  HPI     Past Medical History:  Diagnosis Date  . Atrial fibrillation (HCC)   . Diabetes mellitus   . DVT (deep venous thrombosis) (HCC)   . Hyperlipidemia   . Hypertension   . Leg pain   . Peripheral arterial disease (HCC)   . Stroke [facility]) 1999    Patient Active Problem List   Diagnosis Date Noted  . Cramp of both lower extremities 12/15/2014  . Foot cramps 12/15/2014  . Stiffness of foot 06/10/2014  . Aftercare following surgery of the circulatory system, NEC 12/09/2013  . Peripheral vascular disease (HCC) 10/03/2011  . Bypass graft stenosis (HCC) 07/04/2011  . Chronic total occlusion of artery of the extremities (HCC) 07/04/2011  . PURE HYPERCHOLESTEROLEMIA 03/29/2009  . ESSENTIAL HYPERTENSION, MALIGNANT 03/29/2009  . ESSENTIAL HYPERTENSION, BENIGN 03/28/2009  . DIABETES MELLITUS 08/30/2008  . HYPERLIPIDEMIA-MIXED 08/30/2008  . ATRIAL FIBRILLATION 08/30/2008  . SYNCOPE AND COLLAPSE 08/30/2008    Past Surgical History:  Procedure Laterality Date  . BACK SURGERY  1996  . CATARACT EXTRACTION   2000  . CESAREAN SECTION    . EYE SURGERY    . FEMORAL BYPASS Left July 23, 2010  . LUMBAR DISC SURGERY    . SPINE SURGERY       OB History   No obstetric history on file.     Family History  Problem Relation Age of Onset  . Hypertension Mother   . Heart attack Mother   . Diabetes Father   . Hypertension Father   . Stroke Father   . Stroke Sister   . Diabetes Sister   . Heart disease Sister        Before age 1-Aneurysm  . Dementia Sister   . Alzheimer's disease Sister   . Diabetes Brother   . Diabetes Brother   . Heart disease Brother        Before age 13  . Heart attack Brother   . Heart attack Sister   . Diabetes Sister   . Alzheimer's disease Sister     Social History   Tobacco Use  . Smoking status: Never Smoker  . Smokeless tobacco: Never Used  Substance Use Topics  . Alcohol use: No  . Drug use: No    Home Medications Prior to Admission medications   Medication Sig Start Date End Date Taking? Authorizing Provider  amLODipine (NORVASC) 10 MG tablet TAKE 1 TABLET EVERY DAY Patient taking differently: Take 10 mg by mouth daily.  11/04/11  Yes Lewayne Bunting, MD  atorvastatin (LIPITOR) 40 MG  tablet TAKE 1 TABLET BY MOUTH ONCE A DAY Patient taking differently: Take 40 mg by mouth daily at 6 PM.  06/17/11  Yes Crenshaw, Madolyn Frieze, MD  carvedilol (COREG) 12.5 MG tablet Take 1 tablet (12.5 mg total) by mouth 2 (two) times daily with a meal. 04/22/19  Yes Azalee Course, PA  dabigatran (PRADAXA) 150 MG CAPS capsule Take 1 capsule (150 mg total) by mouth 2 (two) times daily. 04/23/19  Yes Lewayne Bunting, MD  diclofenac sodium (VOLTAREN) 1 % GEL Apply 2 g topically 4 (four) times daily.  02/20/18  Yes [provider]  fish oil-omega-3 fatty acids 1000 MG capsule Take 1 g by mouth daily.     Yes [provider]  glipiZIDE (GLUCOTROL) 10 MG tablet Take 10 mg by mouth 2 (two) times daily before a meal.     Yes [provider]  latanoprost (XALATAN)  0.005 % ophthalmic solution Place 1 drop into both eyes at bedtime.   Yes [provider]  linagliptin (TRADJENTA) 5 MG TABS tablet Take 1 tablet (5 mg total) by mouth daily. 05/20/17  Yes Lewayne Bunting, MD  lisinopril (PRINIVIL,ZESTRIL) 10 MG tablet TAKE 1 TABLET DAILY Patient taking differently: Take 10 mg by mouth daily.  12/03/11  Yes Lewayne Bunting, MD  metFORMIN (GLUCOPHAGE) 500 MG tablet Take 500 mg by mouth 2 (two) times daily with a meal.     Yes [provider]  Multiple Vitamins-Minerals (CENTRUM SILVER PO) Take 1 tablet by mouth 1 dose over 24 hours.     Yes [provider]  ACCU-CHEK SOFTCLIX LANCETS lancets  01/16/18   [provider]  TRUETEST TEST test strip  02/22/13   [provider]  iron polysaccharides (NIFEREX) 150 MG capsule Take 150 mg by mouth daily.    07/04/11  [provider]  topiramate (TOPAMAX) 15 MG capsule Take 15 mg by mouth 2 (two) times daily.    07/04/11  [provider]    Allergies    Patient has no known allergies.  Review of Systems   Review of Systems  Constitutional: Negative for appetite change, chills and fever.  HENT: Negative for ear pain, rhinorrhea, sneezing and sore throat.   Eyes: Negative for photophobia and visual disturbance.  Respiratory: Negative for cough, chest tightness, shortness of breath and wheezing.   Cardiovascular: Negative for chest pain and palpitations.  Gastrointestinal: Negative for abdominal pain, blood in stool, constipation, diarrhea, nausea and vomiting.  Genitourinary: Negative for dysuria, hematuria and urgency.  Musculoskeletal: Negative for myalgias.  Skin: Negative for rash.  Neurological: Negative for dizziness, weakness and light-headedness.    Physical Exam Updated Vital Signs BP 139/66   Pulse 77   Temp 98.2 F (36.8 C) (Oral)   Resp 16   SpO2 98%   Physical Exam Vitals and nursing note reviewed.  Constitutional:      General: She  is not in acute distress.    Appearance: She is well-developed.  HENT:     Head: Normocephalic and atraumatic.     Nose: Nose normal.  Eyes:     General: No scleral icterus.       Left eye: No discharge.     Conjunctiva/sclera: Conjunctivae normal.  Cardiovascular:     Rate and Rhythm: Normal rate and regular rhythm.     Heart sounds: Normal heart sounds. No murmur. No friction rub. No gallop.   Pulmonary:     Effort: Pulmonary effort is normal.  No respiratory distress.     Breath sounds: Normal breath sounds.  Abdominal:     General: Bowel sounds are normal. There is no distension.     Palpations: Abdomen is soft.     Tenderness: There is no abdominal tenderness. There is no guarding.  Musculoskeletal:        General: Normal range of motion.     Cervical back: Normal range of motion and neck supple.  Skin:    General: Skin is warm and dry.     Findings: No rash.  Neurological:     Mental Status: She is alert.     Motor: No abnormal muscle tone.     Coordination: Coordination normal.     ED Results / Procedures / Treatments   Labs (all labs ordered are listed, but only abnormal results are displayed) Labs Reviewed  BASIC METABOLIC PANEL - Abnormal; Notable for the following components:      Result Value   Glucose, Bld 219 (*)    Calcium 10.4 (*)    GFR calc non Af Amer 60 (*)    All other components within normal limits  CBC - Abnormal; Notable for the following components:   RBC 3.61 (*)    Hemoglobin 11.6 (*)    HCT 35.9 (*)    All other components within normal limits  URINALYSIS, ROUTINE W REFLEX MICROSCOPIC - Abnormal; Notable for the following components:   Glucose, UA >=500 (*)    All other components within normal limits  CBG MONITORING, ED - Abnormal; Notable for the following components:   Glucose-Capillary 228 (*)    All other components within normal limits  CBG MONITORING, ED    EKG None  Radiology No results found.  Procedures Procedures  (including critical care time)  Medications Ordered in ED Medications  sodium chloride 0.9 % bolus 1,000 mL (1,000 mLs Intravenous New Bag/Given 05/02/19 1014)    ED Course  I have reviewed the triage vital signs and the nursing notes.  Pertinent labs & imaging results that were available during my care of the patient were reviewed by me and considered in my medical decision making (see chart for details).    MDM Rules/Calculators/A&P                      86 year old female with past medical history of hypertension, NIDDM presenting to the ED with a chief complaint of hyperglycemia.  Sugars were greater than 500 when she checked this morning.  She believes is from the chocolate bar that she ate last night.  She took her home diabetes medications with improvement in her blood sugar to the 200s.  Denies any vomiting, polyuria, polydipsia, diarrhea, abdominal pain, cough or other infectious symptoms.  On exam she is well-appearing.  Abdomen is soft, nontender nondistended.  On arrival here CBG is 228.  Lab work significant for glucosuria but otherwise unremarkable. She remains in NAD throughout her visit here.  Suspect that her hyperglycemia this morning was secondary to food intake.  Fortunately responded well to her diabetes medications.  She denies any other complaints today.  We will have her follow-up with PCP, continue home medications and return for worsening symptoms.  Patient is hemodynamically stable, in NAD, and able to ambulate in the ED. Evaluation does not show pathology that would require ongoing emergent intervention or inpatient treatment. I explained the diagnosis to the patient. Pain has been managed and has no complaints prior to discharge. Patient is  comfortable with above plan and is stable for discharge at this time. All questions were answered prior to disposition. Strict return precautions for returning to the ED were discussed. Encouraged follow up with PCP.   An After Visit  Summary was printed and given to the patient.   Portions of this note were generated with Scientist, clinical (histocompatibility and immunogenetics). Dictation errors may occur despite best attempts at proofreading.  Final Clinical Impression(s) / ED Diagnoses Final diagnoses:  Hyperglycemia    Rx / DC Orders ED Discharge Orders    None       Dietrich Pates, PA-C 05/02/19 1114    Geoffery Lyons, MD 05/02/19 1446

## 2019-05-02 NOTE — Discharge Instructions (Addendum)
Continue your home medications as previously prescribed. Return to the ED if you start to develop vomiting, fever, severe abdominal pain or chest pain.

## 2019-05-02 NOTE — ED Triage Notes (Signed)
Per pt, states she took her sugar this am and it was over 500-states she took her DM med, waited and took CBG-went down to 200-states she ate a little baby ruth for her snack last night so that might have made it high-

## 2019-05-02 NOTE — ED Notes (Signed)
Patient is aware that urine sample is needed and will call out when she has to use the restroom 

## 2019-05-03 ENCOUNTER — Other Ambulatory Visit: Payer: Self-pay | Admitting: *Deleted

## 2019-05-03 ENCOUNTER — Encounter: Payer: Self-pay | Admitting: *Deleted

## 2019-05-03 NOTE — Patient Outreach (Addendum)
Telephone outreach for new [employer] pt with hyperglycemia requiring an ED visit this weekend.  Pt had call the nurse line on 05/02/19 reporting a glucose level >500. She was advised to go to the ED. She took her am diabetes medications: glipizide 10 mg, linagliptin 5 mg and metformin 500 mg and went to the ED. Upon assessment in the ED her glucose level was 228. She does report she ate a high carb meal and a candy bar the night before.  Called to follow up and was unable to reach [name]. Left a message to return my call. Also called pt's daughter [name] to follow up. No answer there and left a voicemail to return my call.  [name]. Pam Estelle, MSN, GNP-BC Gerontological Nurse Practitioner [facility] Care Management [phone]  [name] returned my call. She is a very delightful lady. She reports that it was quit unusual for her to have a high glucose reading (500). Normally she is <120 in am and <160 in the evenings. She checks her glucose at least once a day, sometimes twice a day. She would like to check it twice a day since this happened to her.   She saw Dr. Parke Simmers and her cardiologist the first week in January. Her next appt with Dr. Parke Simmers is April 15th.  She resides alone and has daily contact with the Bishop's wife, [name], [address] [place] and a friend that is like a daughter to her, [name]. Her son lives in [place]. She is independent except for transportation. Her friends take her to appts and she has used her [employer] benefit also.  Advised pt of our care management program and services and invited her to participate for diabetes management and she agrees to do so. Advised that Fleeta Emmer, RN, will contact her within the next 7 days.  Will send a welcome letter to her.  [name]. Pam Estelle, MSN, [facility] Gerontological Nurse Practitioner [facility] Care Management [phone]

## 2019-05-04 ENCOUNTER — Ambulatory Visit: Payer: Medicare HMO | Admitting: *Deleted

## 2019-05-04 ENCOUNTER — Encounter: Payer: Self-pay | Admitting: *Deleted

## 2019-05-07 ENCOUNTER — Other Ambulatory Visit: Payer: Self-pay

## 2019-05-07 NOTE — Patient Outreach (Signed)
[name]) Care Management  [name]  05/07/2019   [name] [birth year] [id number]  Subjective: Telephone call to patient for initial assessment. Patient states she is doing well. Patient reviewed blood sugars.  Last check was 121.  Discussed importance of diet and discussed key dietary items to limit. She verbalized understanding.  Patient lives alone but has support of good friend [name]. She also has a son but he lives out of town.  Patient is independent with care.  She utilizes [name] transportation for appointments. Patient last saw Dr. Criss Hale on 04-30-19 and she contacted Dr. Criss Hale after her sugar went up to 500. Patient uses [facility] for medications.  Patient denies any needs.    Objective:   Encounter Medications:  Outpatient Encounter Medications as of 05/07/2019  Medication Sig Note  . ACCU-CHEK SOFTCLIX LANCETS lancets    . amLODipine (NORVASC) 10 MG tablet TAKE 1 TABLET EVERY DAY (Patient taking differently: Take 10 mg by mouth daily. )   . atorvastatin (LIPITOR) 40 MG tablet TAKE 1 TABLET BY MOUTH ONCE A DAY (Patient taking differently: Take 40 mg by mouth daily at 6 PM. )   . carvedilol (COREG) 12.5 MG tablet Take 1 tablet (12.5 mg total) by mouth 2 (two) times daily with a meal. 05/03/2019: [name] says this medication was just changed by Mr. [name], [name] to 25 mg bid.  . dabigatran (PRADAXA) 150 MG CAPS capsule Take 1 capsule (150 mg total) by mouth 2 (two) times daily.   . diclofenac sodium (VOLTAREN) 1 % GEL Apply 2 g topically 4 (four) times daily.    . fish oil-omega-3 fatty acids 1000 MG capsule Take 1 g by mouth daily.     Marland Kitchen glipiZIDE (GLUCOTROL) 10 MG tablet Take 10 mg by mouth 2 (two) times daily before a meal.     . latanoprost (XALATAN) 0.005 % ophthalmic solution Place 1 drop into both eyes at bedtime.   Marland Kitchen linagliptin (TRADJENTA) 5 MG TABS tablet Take 1 tablet (5 mg total) by mouth daily.   Marland Kitchen lisinopril (PRINIVIL,ZESTRIL) 10  MG tablet TAKE 1 TABLET DAILY (Patient taking differently: Take 10 mg by mouth daily. )   . metFORMIN (GLUCOPHAGE) 500 MG tablet Take 500 mg by mouth 2 (two) times daily with a meal.     . Multiple Vitamins-Minerals (CENTRUM SILVER PO) Take 1 tablet by mouth 1 dose over 24 hours.     . TRUETEST TEST test strip    . [DISCONTINUED] iron polysaccharides (NIFEREX) 150 MG capsule Take 150 mg by mouth daily.     . [DISCONTINUED] topiramate (TOPAMAX) 15 MG capsule Take 15 mg by mouth 2 (two) times daily.      No facility-administered encounter medications on file as of 05/07/2019.    Functional Status:  In your present state of health, do you have any difficulty performing the following activities: 05/07/2019  Hearing? N  Vision? N  Difficulty concentrating or making decisions? N  Walking or climbing stairs? N  Dressing or bathing? N  Doing errands, shopping? N  Preparing Food and eating ? N  Using the Toilet? N  In the past six months, have you accidently leaked urine? N  Do you have problems with loss of bowel control? N  Managing your Medications? N  Managing your Finances? N  Housekeeping or managing your Housekeeping? N  Some recent data might be hidden    Fall/Depression Screening: Fall Risk  05/07/2019 05/03/2019  Falls in  the past year? 0 0  Number falls in past yr: - 0  Injury with Fall? - 0   PHQ 2/9 Scores 05/07/2019 05/03/2019  PHQ - 2 Score 0 0    Assessment: Patient with recent increased blood sugar. Patient monitoring blood sugars and adjusting her diet.    Plan:  [facility] CM Care Plan Problem One     Most Recent Value  Care Plan Problem One  Recent Increased blood sugar  Role Documenting the Problem One  Care Management Telephonic Coordinator  Care Plan for Problem One  Active  THN Long Term Goal   Patient will maintain blood sugar below 160 within 90 days.  THN Long Term Goal Start Date  05/07/19  Interventions for Problem One Long Term Goal  RN CM discussed with  patient blood sugar goals, diet, exercise, and medication adherence.  Also discussed with patient annual wellness.     RN CM will provide ongoing education and support to patient through phone calls.   RN CM will send welcome packet with consent to patient.   RN CM will send initial barriers letter, assessment, and care plan to primary care physician.   RN CM will contact patient next month and patient agrees to next contact.    Pam Leriche, RN, MSN [facility] Care Management Care Management Coordinator Direct Line [phone] Cell [phone] Toll Free: [phone]  Fax: [phone]

## 2019-05-13 ENCOUNTER — Telehealth: Payer: Self-pay

## 2019-05-13 NOTE — Telephone Encounter (Signed)
Received fax stating patient was approved for the [employer] Patient Assistance Program. Patient is eligible to receive medication through the program from May 30 2019- Apr 14 2020.

## 2019-05-28 ENCOUNTER — Other Ambulatory Visit: Payer: Self-pay

## 2019-05-28 NOTE — Patient Outreach (Signed)
[facility]) Care Management  [facility] Care Manager  05/28/2019   [name] Oct 02, 1932 [id number]  Subjective: Incoming call from patient. She was wondering about the COVID vaccine.  She states that someone had called but she could not get the number.  Patient states she does not answer if she does not recognize the number calling.  Patient states she wants to take the vaccine.  Information to [facility] Department given.  Patient reports that her sugars are doing much better.  She states that the only one over 2000 was on 2-6 21.  Patient states she continues to watch her diet and limits sweets.  Encouraged patient to continue.  She verbalized understanding and voices no concerns.     Objective:   Encounter Medications:  Outpatient Encounter Medications as of 05/28/2019  Medication Sig Note  . ACCU-CHEK SOFTCLIX LANCETS lancets    . amLODipine (NORVASC) 10 MG tablet TAKE 1 TABLET EVERY DAY (Patient taking differently: Take 10 mg by mouth daily. )   . atorvastatin (LIPITOR) 40 MG tablet TAKE 1 TABLET BY MOUTH ONCE A DAY (Patient taking differently: Take 40 mg by mouth daily at 6 PM. )   . carvedilol (COREG) 12.5 MG tablet Take 1 tablet (12.5 mg total) by mouth 2 (two) times daily with a meal. 05/03/2019: Mrs. Soman says this medication was just changed by Mr. Lisabeth Devoid, [facility] to 25 mg bid.  . dabigatran (PRADAXA) 150 MG CAPS capsule Take 1 capsule (150 mg total) by mouth 2 (two) times daily.   . diclofenac sodium (VOLTAREN) 1 % GEL Apply 2 g topically 4 (four) times daily.    . fish oil-omega-3 fatty acids 1000 MG capsule Take 1 g by mouth daily.     Marland Kitchen glipiZIDE (GLUCOTROL) 10 MG tablet Take 10 mg by mouth 2 (two) times daily before a meal.     . latanoprost (XALATAN) 0.005 % ophthalmic solution Place 1 drop into both eyes at bedtime.   Marland Kitchen linagliptin (TRADJENTA) 5 MG TABS tablet Take 1 tablet (5 mg total) by mouth daily.   Marland Kitchen lisinopril (PRINIVIL,ZESTRIL) 10 MG  tablet TAKE 1 TABLET DAILY (Patient taking differently: Take 10 mg by mouth daily. )   . metFORMIN (GLUCOPHAGE) 500 MG tablet Take 500 mg by mouth 2 (two) times daily with a meal.     . Multiple Vitamins-Minerals (CENTRUM SILVER PO) Take 1 tablet by mouth 1 dose over 24 hours.     . TRUETEST TEST test strip    . [DISCONTINUED] iron polysaccharides (NIFEREX) 150 MG capsule Take 150 mg by mouth daily.     . [DISCONTINUED] topiramate (TOPAMAX) 15 MG capsule Take 15 mg by mouth 2 (two) times daily.      No facility-administered encounter medications on file as of 05/28/2019.    Functional Status:  In your present state of health, do you have any difficulty performing the following activities: 05/07/2019  Hearing? N  Vision? N  Difficulty concentrating or making decisions? N  Walking or climbing stairs? N  Dressing or bathing? N  Doing errands, shopping? N  Preparing Food and eating ? N  Using the Toilet? N  In the past six months, have you accidently leaked urine? N  Do you have problems with loss of bowel control? N  Managing your Medications? N  Managing your Finances? N  Housekeeping or managing your Housekeeping? N  Some recent data might be hidden    Fall/Depression Screening: Fall Risk  05/07/2019  05/03/2019  Falls in the past year? 0 0  Number falls in past yr: - 0  Injury with Fall? - 0   PHQ 2/9 Scores 05/07/2019 05/03/2019  PHQ - 2 Score 0 0    Assessment: Patient managing diabetes better.  Plan:  [facility] CM Care Plan Problem One     Most Recent Value  Care Plan Problem One  Recent Increased blood sugar  Role Documenting the Problem One  Care Management Telephonic [place] for Problem One  Active  THN Long Term Goal   Patient will maintain blood sugar below 160 within 90 days.  THN Long Term Goal Start Date  05/07/19  Interventions for Problem One Long Term Goal  RN CM discussed sugar ranges. One sugar reported of 213.  Patient continues limit carbohydrates  and sweets in her diet.       RN CM will contact patient next month and patient agreeable.   Jone Baseman, RN, MSN [facility] Care Management Coordinator Direct Line [phone] Cell [phone] Toll Free: [phone]  Fax: [phone]

## 2019-06-04 ENCOUNTER — Ambulatory Visit: Payer: Medicare HMO

## 2019-06-11 ENCOUNTER — Ambulatory Visit: Payer: Medicare HMO

## 2019-06-13 ENCOUNTER — Ambulatory Visit: Payer: Medicare HMO | Attending: Internal Medicine

## 2019-06-13 DIAGNOSIS — Z23 Encounter for immunization: Secondary | ICD-10-CM | POA: Insufficient documentation

## 2019-06-13 NOTE — Progress Notes (Signed)
   Covid-19 Vaccination Clinic  Name:  SHAWNISE PETERKIN    MRN: [id number] DOB: 02-13-1933  06/13/2019  Ms. Kerman was observed post Covid-19 immunization for 15 minutes without incidence. She was provided with Vaccine Information Sheet and instruction to access the V-Safe system.   Ms. Rambo was instructed to call 911 with any severe reactions post vaccine: Marland Kitchen Difficulty breathing  . Swelling of your face and throat  . A fast heartbeat  . A bad rash all over your body  . Dizziness and weakness    Immunizations Administered    Name Date Dose VIS Date Route   Pfizer COVID-19 Vaccine 06/13/2019  4:05 PM 0.3 mL 03/26/2019 Intramuscular   Manufacturer: [employer], [employer]   Lot: [id number]   NDC: [address]-7

## 2019-06-14 DIAGNOSIS — E1151 Type 2 diabetes mellitus with diabetic peripheral angiopathy without gangrene: Secondary | ICD-10-CM | POA: Diagnosis not present

## 2019-06-14 DIAGNOSIS — L84 Corns and callosities: Secondary | ICD-10-CM | POA: Diagnosis not present

## 2019-06-14 DIAGNOSIS — L603 Nail dystrophy: Secondary | ICD-10-CM | POA: Diagnosis not present

## 2019-06-14 DIAGNOSIS — I739 Peripheral vascular disease, unspecified: Secondary | ICD-10-CM | POA: Diagnosis not present

## 2019-06-14 DIAGNOSIS — R6889 Other general symptoms and signs: Secondary | ICD-10-CM | POA: Diagnosis not present

## 2019-06-23 ENCOUNTER — Other Ambulatory Visit: Payer: Self-pay

## 2019-06-23 NOTE — Patient Outreach (Signed)
[facility]) Care Management  06/23/2019  ARISSA FAGIN [birth year] [id number]   Telephone call to patient for monthly disease management follow up. No answer.  HIPAA compliant voice message left.  Plan: RN CM will attempt patient again in the month of April and send letter.    [name], RN, MSN [facility] Care Management Care Management Coordinator Direct Line [phone] Cell [phone] Toll Free: [phone]  Fax: [phone]

## 2019-06-24 ENCOUNTER — Ambulatory Visit: Payer: Self-pay

## 2019-06-24 ENCOUNTER — Other Ambulatory Visit: Payer: Self-pay

## 2019-06-24 NOTE — Patient Outreach (Signed)
[facility]) Care Management  Central  06/24/2019   [name] 1932-10-03 [id number]  Subjective: Incoming return call from patient. She reports she is doing good.  She reports that she is waiting for CBG strips to come but states she may need to go to the store to pick some up but she states that where she orders from says they are in the mail.  Patient asked questions about her CBG's.  Discussed ranges and diet.  She verbalized understanding.  Highest per patient was 181.  Encouraged patient not to stress over her sugars as they are doing great.  She verbalized understanding and appreciative of compliment.  She voices no other questions.    Objective:   Encounter Medications:  Outpatient Encounter Medications as of 06/24/2019  Medication Sig Note  . ACCU-CHEK SOFTCLIX LANCETS lancets    . amLODipine (NORVASC) 10 MG tablet TAKE 1 TABLET EVERY DAY (Patient taking differently: Take 10 mg by mouth daily. )   . atorvastatin (LIPITOR) 40 MG tablet TAKE 1 TABLET BY MOUTH ONCE A DAY (Patient taking differently: Take 40 mg by mouth daily at 6 PM. )   . carvedilol (COREG) 12.5 MG tablet Take 1 tablet (12.5 mg total) by mouth 2 (two) times daily with a meal. 05/03/2019: Mrs. Koenig says this medication was just changed by Mr. Eulas Post, [facility] to 25 mg bid.  . dabigatran (PRADAXA) 150 MG CAPS capsule Take 1 capsule (150 mg total) by mouth 2 (two) times daily.   . diclofenac sodium (VOLTAREN) 1 % GEL Apply 2 g topically 4 (four) times daily.    . fish oil-omega-3 fatty acids 1000 MG capsule Take 1 g by mouth daily.     Marland Kitchen glipiZIDE (GLUCOTROL) 10 MG tablet Take 10 mg by mouth 2 (two) times daily before a meal.     . latanoprost (XALATAN) 0.005 % ophthalmic solution Place 1 drop into both eyes at bedtime.   Marland Kitchen linagliptin (TRADJENTA) 5 MG TABS tablet Take 1 tablet (5 mg total) by mouth daily.   Marland Kitchen lisinopril (PRINIVIL,ZESTRIL) 10 MG tablet TAKE 1 TABLET DAILY (Patient taking  differently: Take 10 mg by mouth daily. )   . metFORMIN (GLUCOPHAGE) 500 MG tablet Take 500 mg by mouth 2 (two) times daily with a meal.     . Multiple Vitamins-Minerals (CENTRUM SILVER PO) Take 1 tablet by mouth 1 dose over 24 hours.     . TRUETEST TEST test strip    . [DISCONTINUED] iron polysaccharides (NIFEREX) 150 MG capsule Take 150 mg by mouth daily.     . [DISCONTINUED] topiramate (TOPAMAX) 15 MG capsule Take 15 mg by mouth 2 (two) times daily.      No facility-administered encounter medications on file as of 06/24/2019.    Functional Status:  In your present state of health, do you have any difficulty performing the following activities: 05/07/2019  Hearing? N  Vision? N  Difficulty concentrating or making decisions? N  Walking or climbing stairs? N  Dressing or bathing? N  Doing errands, shopping? N  Preparing Food and eating ? N  Using the Toilet? N  In the past six months, have you accidently leaked urine? N  Do you have problems with loss of bowel control? N  Managing your Medications? N  Managing your Finances? N  Housekeeping or managing your Housekeeping? N  Some recent data might be hidden    Fall/Depression Screening: Fall Risk  05/07/2019 05/03/2019  Falls in the  past year? 0 0  Number falls in past yr: - 0  Injury with Fall? - 0   PHQ 2/9 Scores 05/07/2019 05/03/2019  PHQ - 2 Score 0 0    Assessment: Patient continues to manage diabetes and continues to benefit from disease management.   Plan:  [facility] CM Care Plan Problem One     Most Recent Value  Care Plan Problem One  Recent Increased blood sugar  Role Documenting the Problem One  Care Management Telephonic Coordinator  Care Plan for Problem One  Active  THN Long Term Goal   Patient will maintain blood sugar below 160 within 90 days.  THN Long Term Goal Start Date  06/24/19  Interventions for Problem One Long Term Goal  Discussec with patient sugar ranges,  diet items, and importance of sugar control.        RN CM will contact in the month of May and patient agreeable.   [name], RN, MSN [facility] Care Management Care Management Coordinator Direct Line [phone] Cell [phone] Toll Free: [phone]  Fax: [phone]

## 2019-06-26 ENCOUNTER — Emergency Department (HOSPITAL_COMMUNITY)
Admission: EM | Admit: 2019-06-26 | Discharge: 2019-06-26 | Disposition: A | Discharge status: Home / Self Care | Payer: Medicare HMO | Attending: Emergency Medicine | Admitting: Emergency Medicine

## 2019-06-26 ENCOUNTER — Encounter (HOSPITAL_COMMUNITY): Payer: Self-pay | Admitting: *Deleted

## 2019-06-26 ENCOUNTER — Other Ambulatory Visit: Payer: Self-pay

## 2019-06-26 DIAGNOSIS — Z8673 Personal history of transient ischemic attack (TIA), and cerebral infarction without residual deficits: Secondary | ICD-10-CM | POA: Diagnosis not present

## 2019-06-26 DIAGNOSIS — E1165 Type 2 diabetes mellitus with hyperglycemia: Secondary | ICD-10-CM | POA: Diagnosis not present

## 2019-06-26 DIAGNOSIS — Z7984 Long term (current) use of oral hypoglycemic drugs: Secondary | ICD-10-CM | POA: Diagnosis not present

## 2019-06-26 DIAGNOSIS — Z79899 Other long term (current) drug therapy: Secondary | ICD-10-CM | POA: Diagnosis not present

## 2019-06-26 DIAGNOSIS — I10 Essential (primary) hypertension: Secondary | ICD-10-CM | POA: Insufficient documentation

## 2019-06-26 DIAGNOSIS — R739 Hyperglycemia, unspecified: Secondary | ICD-10-CM

## 2019-06-26 LAB — URINALYSIS, ROUTINE W REFLEX MICROSCOPIC
Bacteria, UA: NONE SEEN
Bilirubin Urine: NEGATIVE
Glucose, UA: >=500 mg/dL — AB
Hgb urine dipstick: NEGATIVE
Ketones, ur: NEGATIVE mg/dL
Leukocytes,Ua: NEGATIVE
Nitrite: NEGATIVE
Protein, ur: NEGATIVE mg/dL
Specific Gravity, Urine: 1.006 (ref 1.005–1.030)
pH: 7 (ref 5.0–8.0)

## 2019-06-26 LAB — BASIC METABOLIC PANEL
Anion gap: 9 (ref 5–15)
BUN: 18 mg/dL (ref 8–23)
CO2: 26 mmol/L (ref 22–32)
Calcium: 9.7 mg/dL (ref 8.9–10.3)
Chloride: 108 mmol/L (ref 98–111)
Creatinine, Ser: 0.73 mg/dL (ref 0.44–1.00)
GFR calc Af Amer: >60 mL/min (ref >60)
GFR calc non Af Amer: >60 mL/min (ref >60)
Glucose, Bld: 224 mg/dL — ABNORMAL HIGH (ref 70–99)
Potassium: 4 mmol/L (ref 3.5–5.1)
Sodium: 143 mmol/L (ref 135–145)

## 2019-06-26 LAB — CBG MONITORING, ED
Glucose-Capillary: 203 mg/dL — ABNORMAL HIGH (ref 70–99)
Glucose-Capillary: 208 mg/dL — ABNORMAL HIGH (ref 70–99)

## 2019-06-26 LAB — CBC WITH DIFFERENTIAL/PLATELET
Abs Immature Granulocytes: 0.01 10*3/uL (ref 0.00–0.07)
Basophils Absolute: 0 10*3/uL (ref 0.0–0.1)
Basophils Relative: 1 %
Eosinophils Absolute: 0.1 10*3/uL (ref 0.0–0.5)
Eosinophils Relative: 1 %
HCT: 32.9 % — ABNORMAL LOW (ref 36.0–46.0)
Hemoglobin: 10.4 g/dL — ABNORMAL LOW (ref 12.0–15.0)
Immature Granulocytes: 0 %
Lymphocytes Relative: 23 %
Lymphs Abs: 1.1 10*3/uL (ref 0.7–4.0)
MCH: 31.4 pg (ref 26.0–34.0)
MCHC: 31.6 g/dL (ref 30.0–36.0)
MCV: 99.4 fL (ref 80.0–100.0)
Monocytes Absolute: 0.7 10*3/uL (ref 0.1–1.0)
Monocytes Relative: 14 %
Neutro Abs: 2.9 10*3/uL (ref 1.7–7.7)
Neutrophils Relative %: 61 %
Platelets: 235 10*3/uL (ref 150–400)
RBC: 3.31 MIL/uL — ABNORMAL LOW (ref 3.87–5.11)
RDW: 15.7 % — ABNORMAL HIGH (ref 11.5–15.5)
WBC: 4.8 10*3/uL (ref 4.0–10.5)
nRBC: 0 % (ref 0.0–0.2)

## 2019-06-26 LAB — BLOOD GAS, VENOUS
Acid-Base Excess: 2.7 mmol/L — ABNORMAL HIGH (ref 0.0–2.0)
Bicarbonate: 28.3 mmol/L — ABNORMAL HIGH (ref 20.0–28.0)
O2 Saturation: 71.5 %
Patient temperature: 98.6
pCO2, Ven: 51.1 mmHg (ref 44.0–60.0)
pH, Ven: 7.362 (ref 7.250–7.430)
pO2, Ven: 43.5 mmHg (ref 32.0–45.0)

## 2019-06-26 MED ORDER — SODIUM CHLORIDE 0.9 % IV SOLN: Freq: Once | INTRAVENOUS | Status: AC

## 2019-06-26 MED ORDER — LACTATED RINGERS IV BOLUS: 1000.0000 mL | Freq: Once | INTRAVENOUS | Status: DC

## 2019-06-26 NOTE — ED Notes (Signed)
Patient given cup of coffee

## 2019-06-26 NOTE — ED Triage Notes (Addendum)
Pt presents with daughter with hyperglycemia, dry mouth, freq urination. Denies N/V CBG 203 in triage

## 2019-06-26 NOTE — ED Notes (Signed)
Pt lying in bed. Full monitor placed. Pt denies any needs. Will continue to monitor

## 2019-06-26 NOTE — ED Provider Notes (Signed)
[facility]-EMERGENCY DEPT Provider Note   CSN: [id number] Arrival date & time: 06/26/19  6237     History Chief Complaint  Patient presents with  . Hyperglycemia    [name] is a 86 y.o. female.   Hyperglycemia Blood sugar level PTA:  500 Severity:  Mild Onset quality:  Gradual Timing:  Intermittent Progression:  Resolved Chronicity:  New Diabetes status:  Controlled with oral medications Context: not change in medication, not insulin pump use, not new diabetes diagnosis, not noncompliance, not recent change in diet and not recent illness   Relieved by:  None tried Ineffective treatments:  None tried Associated symptoms: no abdominal pain        Past Medical History:  Diagnosis Date  . Atrial fibrillation (HCC)   . Diabetes mellitus   . DVT (deep venous thrombosis) (HCC)   . Hyperlipidemia   . Hypertension   . Leg pain   . Peripheral arterial disease (HCC)   . Stroke [facility]) 1999    Patient Active Problem List   Diagnosis Date Noted  . Cramp of both lower extremities 12/15/2014  . Foot cramps 12/15/2014  . Stiffness of foot 06/10/2014  . Aftercare following surgery of the circulatory system, NEC 12/09/2013  . Peripheral vascular disease (HCC) 10/03/2011  . Bypass graft stenosis (HCC) 07/04/2011  . Chronic total occlusion of artery of the extremities (HCC) 07/04/2011  . PURE HYPERCHOLESTEROLEMIA 03/29/2009  . ESSENTIAL HYPERTENSION, MALIGNANT 03/29/2009  . ESSENTIAL HYPERTENSION, BENIGN 03/28/2009  . DIABETES MELLITUS 08/30/2008  . HYPERLIPIDEMIA-MIXED 08/30/2008  . ATRIAL FIBRILLATION 08/30/2008  . SYNCOPE AND COLLAPSE 08/30/2008    Past Surgical History:  Procedure Laterality Date  . BACK SURGERY  1996  . CATARACT EXTRACTION  2000  . CESAREAN SECTION    . EYE SURGERY    . FEMORAL BYPASS Left July 23, 2010  . LUMBAR DISC SURGERY    . SPINE SURGERY       OB History   No obstetric history on file.     Family  History  Problem Relation Age of Onset  . Hypertension Mother   . Heart attack Mother   . Diabetes Father   . Hypertension Father   . Stroke Father   . Stroke Sister   . Diabetes Sister   . Heart disease Sister        Before age 37-Aneurysm  . Dementia Sister   . Alzheimer's disease Sister   . Diabetes Brother   . Diabetes Brother   . Heart disease Brother        Before age 66  . Heart attack Brother   . Heart attack Sister   . Diabetes Sister   . Alzheimer's disease Sister     Social History   Tobacco Use  . Smoking status: Never Smoker  . Smokeless tobacco: Never Used  Substance Use Topics  . Alcohol use: No  . Drug use: No    Home Medications Prior to Admission medications   Medication Sig Start Date End Date Taking? Authorizing Provider  ACCU-CHEK SOFTCLIX LANCETS lancets  01/16/18   [provider]  amLODipine (NORVASC) 10 MG tablet TAKE 1 TABLET EVERY DAY Patient taking differently: Take 10 mg by mouth daily.  11/04/11   Lewayne Bunting, MD  atorvastatin (LIPITOR) 40 MG tablet TAKE 1 TABLET BY MOUTH ONCE A DAY Patient taking differently: Take 40 mg by mouth daily at 6 PM.  06/17/11   Jens Som, Madolyn Frieze, MD  carvedilol (  COREG) 12.5 MG tablet Take 1 tablet (12.5 mg total) by mouth 2 (two) times daily with a meal. 04/22/19   Azalee Course, PA  dabigatran (PRADAXA) 150 MG CAPS capsule Take 1 capsule (150 mg total) by mouth 2 (two) times daily. 04/23/19   Lewayne Bunting, MD  diclofenac sodium (VOLTAREN) 1 % GEL Apply 2 g topically 4 (four) times daily.  02/20/18   [provider]  fish oil-omega-3 fatty acids 1000 MG capsule Take 1 g by mouth daily.      [provider]  glipiZIDE (GLUCOTROL) 10 MG tablet Take 10 mg by mouth 2 (two) times daily before a meal.      [provider]  latanoprost (XALATAN) 0.005 % ophthalmic solution Place 1 drop into both eyes at bedtime.    [provider]  linagliptin (TRADJENTA) 5 MG TABS tablet Take  1 tablet (5 mg total) by mouth daily. 05/20/17   Lewayne Bunting, MD  lisinopril (PRINIVIL,ZESTRIL) 10 MG tablet TAKE 1 TABLET DAILY Patient taking differently: Take 10 mg by mouth daily.  12/03/11   Lewayne Bunting, MD  metFORMIN (GLUCOPHAGE) 500 MG tablet Take 500 mg by mouth 2 (two) times daily with a meal.      [provider]  Multiple Vitamins-Minerals (CENTRUM SILVER PO) Take 1 tablet by mouth 1 dose over 24 hours.      [provider]  TRUETEST TEST test strip  02/22/13   [provider]  iron polysaccharides (NIFEREX) 150 MG capsule Take 150 mg by mouth daily.    07/04/11  [provider]  topiramate (TOPAMAX) 15 MG capsule Take 15 mg by mouth 2 (two) times daily.    07/04/11  [provider]    Allergies    Patient has no known allergies.  Review of Systems   Review of Systems  Gastrointestinal: Negative for abdominal pain.  All other systems reviewed and are negative.   Physical Exam Updated Vital Signs BP (!) 173/94 (BP Location: Left Arm)   Pulse 95   Temp (!) 97.3 F (36.3 C) (Oral)   Resp 16   Ht 4\' 11"  (1.499 m)   Wt 44 kg   SpO2 100%   BMI 19.59 kg/m   Physical Exam Vitals and nursing note reviewed.  Constitutional:      Appearance: She is well-developed.  HENT:     Head: Normocephalic and atraumatic.     Mouth/Throat:     Mouth: Mucous membranes are dry.     Pharynx: Oropharynx is clear.  Cardiovascular:     Rate and Rhythm: Normal rate and regular rhythm.  Pulmonary:     Effort: No respiratory distress.     Breath sounds: No stridor.  Abdominal:     General: There is no distension.  Musculoskeletal:        General: No swelling or tenderness. Normal range of motion.     Cervical back: Normal range of motion.  Skin:    General: Skin is warm and dry.  Neurological:     General: No focal deficit present.     Mental Status: She is alert.     ED Results / Procedures / Treatments   Labs (all labs  ordered are listed, but only abnormal results are displayed) Labs Reviewed  CBG MONITORING, ED - Abnormal; Notable for the following components:      Result Value   Glucose-Capillary 203 (*)    All other components within normal limits  CBG MONITORING, ED - Abnormal; Notable for the following components:   Glucose-Capillary 208 (*)    All other components within normal limits  CBC WITH DIFFERENTIAL/PLATELET  BASIC METABOLIC PANEL  URINALYSIS, ROUTINE W REFLEX MICROSCOPIC  BLOOD GAS, VENOUS    EKG None  Radiology No results found.  Procedures Procedures (including critical care time)  Medications Ordered in ED Medications  lactated ringers bolus 1,000 mL (has no administration in time range)    ED Course  I have reviewed the triage vital signs and the nursing notes.    Pertinent labs & imaging results that were available during my care of the patient were reviewed by me and considered in my medical decision making (see chart for details).    MDM Rules/Calculators/A&P                      Likely faulty meter at home but read >500, will check labs, fluids. eval for UA.  Overall labs not consistent with DKA.  Still pending basic metabolic panel at time of transfer of care.  Still likely discharge.  Final Clinical Impression(s) / ED Diagnoses Final diagnoses:  None    Rx / DC Orders ED Discharge Orders    None       Mayfield Schoene, Corene Cornea, MD 06/27/19 0005

## 2019-06-28 ENCOUNTER — Other Ambulatory Visit: Payer: Self-pay

## 2019-06-28 NOTE — Patient Outreach (Signed)
[facility]) Care Management  06/28/2019  [name] Aug 26, 1932 [id number]   Telephone call to patient for follow up hyperglycemia ER visit.  No answer.  HIPAA compliant voice message left.  Plan: RN CM will wait return call from patient.    Incoming call from patient. She states she is doing ok. She states that she thinks her CBG machine is bad and that she will be calling her MD to request another. She states her present machine is 34-62 years old.  She states that over the weekend that her sugar read 527 but when she got to the hospital it was in the 300's per what she was told. She states that her blood sugars have been ok and that most recent one 222.  Encouraged patient to get a new meter and notify her physician of her ED visit.  She verbalized understanding.   Plan: RN CM will outreach again in the month of May and patient agreeable.  [name], RN, MSN [facility] Care Management Care Management Coordinator Direct Line [phone] Cell [phone] Toll Free: [phone]  Fax: [phone]

## 2019-07-09 DIAGNOSIS — I10 Essential (primary) hypertension: Secondary | ICD-10-CM | POA: Diagnosis not present

## 2019-07-09 DIAGNOSIS — I480 Paroxysmal atrial fibrillation: Secondary | ICD-10-CM | POA: Diagnosis not present

## 2019-07-09 DIAGNOSIS — E1169 Type 2 diabetes mellitus with other specified complication: Secondary | ICD-10-CM | POA: Diagnosis not present

## 2019-07-09 DIAGNOSIS — E7849 Other hyperlipidemia: Secondary | ICD-10-CM | POA: Diagnosis not present

## 2019-07-13 ENCOUNTER — Ambulatory Visit: Payer: Medicare HMO | Attending: Internal Medicine

## 2019-07-13 DIAGNOSIS — Z23 Encounter for immunization: Secondary | ICD-10-CM

## 2019-07-13 NOTE — Progress Notes (Signed)
   Covid-19 Vaccination Clinic  Name:  CARRERA KIESEL    MRN: [id number] DOB: 1932-05-03  07/13/2019  Ms. Kaminski was observed post Covid-19 immunization for 15 minutes without incident. She was provided with Vaccine Information Sheet and instruction to access the V-Safe system.   Ms. Gaeta was instructed to call 911 with any severe reactions post vaccine: Marland Kitchen Difficulty breathing  . Swelling of face and throat  . A fast heartbeat  . A bad rash all over body  . Dizziness and weakness   Immunizations Administered    Name Date Dose VIS Date Route   Pfizer COVID-19 Vaccine 07/13/2019  4:30 PM 0.3 mL 03/26/2019 Intramuscular   Manufacturer: [employer], [employer]   Lot: [id number]   NDC: [address]-3

## 2019-07-22 ENCOUNTER — Ambulatory Visit: Payer: Self-pay

## 2019-07-29 DIAGNOSIS — E1169 Type 2 diabetes mellitus with other specified complication: Secondary | ICD-10-CM | POA: Diagnosis not present

## 2019-07-29 DIAGNOSIS — I10 Essential (primary) hypertension: Secondary | ICD-10-CM | POA: Diagnosis not present

## 2019-07-29 DIAGNOSIS — I739 Peripheral vascular disease, unspecified: Secondary | ICD-10-CM | POA: Diagnosis not present

## 2019-07-29 DIAGNOSIS — R6889 Other general symptoms and signs: Secondary | ICD-10-CM | POA: Diagnosis not present

## 2019-07-29 DIAGNOSIS — E782 Mixed hyperlipidemia: Secondary | ICD-10-CM | POA: Diagnosis not present

## 2019-07-29 DIAGNOSIS — M130 Polyarthritis, unspecified: Secondary | ICD-10-CM | POA: Diagnosis not present

## 2019-08-05 ENCOUNTER — Telehealth: Payer: Self-pay | Admitting: Physician Assistant

## 2019-08-05 NOTE — Telephone Encounter (Signed)
Routed to primary MD to advise on refill

## 2019-08-05 NOTE — Telephone Encounter (Signed)
New message   Patient needs a new prescription for linagliptin (TRADJENTA) 5 MG TABS tablet sent to [facility] [address] [phone]

## 2019-08-05 NOTE — Telephone Encounter (Signed)
Needs to have refilled by primary care [name]

## 2019-08-05 NOTE — Telephone Encounter (Signed)
*  STAT* If patient is at the pharmacy, call can be transferred to refill team.   1. Which medications need to be refilled? (please list name of each medication and dose if known) new prescription for Tradjenta  2. Which pharmacy/location (including street and city if local pharmacy) is medication to be sent to? [facility]- Fax [phone]  3. Do they need a 30 day or 90 day supply? 90 days and refills

## 2019-08-09 NOTE — Telephone Encounter (Signed)
Refill will be forwarded to medical doctor

## 2019-08-09 NOTE — Telephone Encounter (Signed)
Spoke with pt pharmacy, aware to contact medical doctor.

## 2019-08-13 DIAGNOSIS — E1169 Type 2 diabetes mellitus with other specified complication: Secondary | ICD-10-CM | POA: Diagnosis not present

## 2019-08-13 DIAGNOSIS — I10 Essential (primary) hypertension: Secondary | ICD-10-CM | POA: Diagnosis not present

## 2019-08-13 DIAGNOSIS — I480 Paroxysmal atrial fibrillation: Secondary | ICD-10-CM | POA: Diagnosis not present

## 2019-08-13 DIAGNOSIS — E7849 Other hyperlipidemia: Secondary | ICD-10-CM | POA: Diagnosis not present

## 2019-08-25 ENCOUNTER — Other Ambulatory Visit: Payer: Self-pay

## 2019-08-25 NOTE — Patient Outreach (Signed)
[facility]) Care Management  08/25/2019  [name] 04-18-1932 [id number]   Telephone call to patient for disease management follow up. No answer.  HIPAA compliant voice message left.    Plan: RN CM will wait return call.  If no return call will attempt patient again in the month of August and send letter.   [name], RN, MSN [facility] Care Management Care Management Coordinator Direct Line [phone] Cell [phone] Toll Free: [phone]  Fax: [phone]

## 2019-08-30 ENCOUNTER — Other Ambulatory Visit: Payer: Self-pay

## 2019-08-30 NOTE — Patient Outreach (Signed)
[facility]) Care Management  [place]  08/30/2019   [name] 1933/01/29 [id number]  Subjective: Incoming call from patient.   She reports she is doing pretty good with her sugars. She states that today her sugar was 121.  She reports a couple of blood sugars over 200 due to eating sweets or too much bread but other wise her sugars are good.  Discussed diet and exercise.  She verbalized understanding and voices no concerns.    Objective:   Encounter Medications:  Outpatient Encounter Medications as of 08/30/2019  Medication Sig Note  . ACCU-CHEK SOFTCLIX LANCETS lancets    . amLODipine (NORVASC) 10 MG tablet TAKE 1 TABLET EVERY DAY (Patient taking differently: Take 10 mg by mouth daily. )   . atorvastatin (LIPITOR) 40 MG tablet TAKE 1 TABLET BY MOUTH ONCE A DAY (Patient taking differently: Take 40 mg by mouth daily at 6 PM. )   . carvedilol (COREG) 12.5 MG tablet Take 1 tablet (12.5 mg total) by mouth 2 (two) times daily with a meal. 05/03/2019: Mrs. Giaimo says this medication was just changed by Mr. Eulas Post, [facility] to 25 mg bid.  . dabigatran (PRADAXA) 150 MG CAPS capsule Take 1 capsule (150 mg total) by mouth 2 (two) times daily.   . diclofenac sodium (VOLTAREN) 1 % GEL Apply 2 g topically 4 (four) times daily.    . fish oil-omega-3 fatty acids 1000 MG capsule Take 1 g by mouth daily.     Marland Kitchen glipiZIDE (GLUCOTROL) 10 MG tablet Take 10 mg by mouth 2 (two) times daily before a meal.     . latanoprost (XALATAN) 0.005 % ophthalmic solution Place 1 drop into both eyes at bedtime.   Marland Kitchen linagliptin (TRADJENTA) 5 MG TABS tablet Take 1 tablet (5 mg total) by mouth daily.   Marland Kitchen lisinopril (PRINIVIL,ZESTRIL) 10 MG tablet TAKE 1 TABLET DAILY (Patient taking differently: Take 10 mg by mouth daily. )   . metFORMIN (GLUCOPHAGE) 500 MG tablet Take 500 mg by mouth 2 (two) times daily with a meal.     . Multiple Vitamins-Minerals (CENTRUM SILVER PO) Take 1 tablet by mouth 1 dose  over 24 hours.     . TRUETEST TEST test strip    . [DISCONTINUED] iron polysaccharides (NIFEREX) 150 MG capsule Take 150 mg by mouth daily.     . [DISCONTINUED] topiramate (TOPAMAX) 15 MG capsule Take 15 mg by mouth 2 (two) times daily.      No facility-administered encounter medications on file as of 08/30/2019.    Functional Status:  In your present state of health, do you have any difficulty performing the following activities: 05/07/2019  Hearing? N  Vision? N  Difficulty concentrating or making decisions? N  Walking or climbing stairs? N  Dressing or bathing? N  Doing errands, shopping? N  Preparing Food and eating ? N  Using the Toilet? N  In the past six months, have you accidently leaked urine? N  Do you have problems with loss of bowel control? N  Managing your Medications? N  Managing your Finances? N  Housekeeping or managing your Housekeeping? N  Some recent data might be hidden    Fall/Depression Screening: Fall Risk  08/30/2019 05/07/2019 05/03/2019  Falls in the past year? 0 0 0  Number falls in past yr: - - 0  Injury with Fall? - - 0   PHQ 2/9 Scores 08/30/2019 05/07/2019 05/03/2019  PHQ - 2 Score 0 0 0  Assessment: Patient continues to manage diabetes and benefits from disease management support and outreach.    Plan:  [facility] CM Care Plan Problem One     Most Recent Value  Care Plan Problem One  Recent Increased blood sugar  Role Documenting the Problem One  Care Management Telephonic Coordinator  Care Plan for Problem One  Active  THN Long Term Goal   Patient will maintain blood sugar below 160 within 90 days.  THN Long Term Goal Start Date  08/30/19  Interventions for Problem One Long Term Goal  Patient checking sugars regularly.  Patient continues to watch her diet.  Discussed diet control.       RN CM will contact patient in the month of August and patient agreeable.    [name], RN, MSN [facility] Care Management Care Management Coordinator Direct  Line [phone] Cell [phone] Toll Free: [phone]  Fax: [phone]

## 2019-09-28 DIAGNOSIS — H40012 Open angle with borderline findings, low risk, left eye: Secondary | ICD-10-CM | POA: Diagnosis not present

## 2019-09-28 DIAGNOSIS — H401210 Low-tension glaucoma, right eye, stage unspecified: Secondary | ICD-10-CM | POA: Diagnosis not present

## 2019-09-28 DIAGNOSIS — E113293 Type 2 diabetes mellitus with mild nonproliferative diabetic retinopathy without macular edema, bilateral: Secondary | ICD-10-CM | POA: Diagnosis not present

## 2019-09-28 DIAGNOSIS — R6889 Other general symptoms and signs: Secondary | ICD-10-CM | POA: Diagnosis not present

## 2019-11-11 ENCOUNTER — Other Ambulatory Visit: Payer: Self-pay | Admitting: Physician Assistant

## 2019-11-24 ENCOUNTER — Other Ambulatory Visit: Payer: Self-pay

## 2019-11-24 NOTE — Patient Outreach (Signed)
[facility]) Care Management  11/24/2019  [name] [birth year] [id number]   Telephone call to patient for disease management.  No answer.  HIPAA compliant voice message left.    Plan: If no return call, RN CM will attempt patient again in the month of November.    [name], RN, MSN [facility] Care Management Care Management Coordinator Direct Line [phone] Cell [phone] Toll Free: [phone]  Fax: [phone]

## 2019-12-13 DIAGNOSIS — I480 Paroxysmal atrial fibrillation: Secondary | ICD-10-CM | POA: Diagnosis not present

## 2019-12-13 DIAGNOSIS — Z0000 Encounter for general adult medical examination without abnormal findings: Secondary | ICD-10-CM | POA: Diagnosis not present

## 2019-12-13 DIAGNOSIS — M130 Polyarthritis, unspecified: Secondary | ICD-10-CM | POA: Diagnosis not present

## 2019-12-13 DIAGNOSIS — E1169 Type 2 diabetes mellitus with other specified complication: Secondary | ICD-10-CM | POA: Diagnosis not present

## 2019-12-13 DIAGNOSIS — R6889 Other general symptoms and signs: Secondary | ICD-10-CM | POA: Diagnosis not present

## 2019-12-13 DIAGNOSIS — I6789 Other cerebrovascular disease: Secondary | ICD-10-CM | POA: Diagnosis not present

## 2019-12-14 DIAGNOSIS — I480 Paroxysmal atrial fibrillation: Secondary | ICD-10-CM | POA: Diagnosis not present

## 2019-12-14 DIAGNOSIS — E7849 Other hyperlipidemia: Secondary | ICD-10-CM | POA: Diagnosis not present

## 2019-12-14 DIAGNOSIS — I10 Essential (primary) hypertension: Secondary | ICD-10-CM | POA: Diagnosis not present

## 2019-12-14 DIAGNOSIS — E1169 Type 2 diabetes mellitus with other specified complication: Secondary | ICD-10-CM | POA: Diagnosis not present

## 2020-01-03 DIAGNOSIS — E1151 Type 2 diabetes mellitus with diabetic peripheral angiopathy without gangrene: Secondary | ICD-10-CM | POA: Diagnosis not present

## 2020-01-03 DIAGNOSIS — L84 Corns and callosities: Secondary | ICD-10-CM | POA: Diagnosis not present

## 2020-01-03 DIAGNOSIS — L603 Nail dystrophy: Secondary | ICD-10-CM | POA: Diagnosis not present

## 2020-01-03 DIAGNOSIS — R6889 Other general symptoms and signs: Secondary | ICD-10-CM | POA: Diagnosis not present

## 2020-01-03 DIAGNOSIS — I739 Peripheral vascular disease, unspecified: Secondary | ICD-10-CM | POA: Diagnosis not present

## 2020-01-26 ENCOUNTER — Other Ambulatory Visit: Payer: Self-pay

## 2020-01-26 NOTE — Patient Outreach (Signed)
[facility]) Care Management  01/26/2020  KORTNEY POTVIN 12-27-1932 [id number]   Telephone Screen  Referral Date: 01/26/2020 Referral Source: Nurse Line Referral Reason: "Called client back.  She was wanting to know if her BP at 131/83 is ok.  She stated she is on BP medication and her normal BP is 138/61.  She denies any headaches, chest pain, dizziness and states she feels fine.  Advised she could have eaten something salty the night before but she should still see her MD within 2 weeks and she stated she will."    Insurance: [employer] attempt # 1 to patient. No answer. RN CM left HIPAA compliant voicemail message along with contact info.      Plan: RN CM will make outreach attempt to patient within 3-4 business days.   Antionette Fairy, RN,BSN,CCM [facility] Care Management Telephonic Care Management Coordinator Direct Phone: [phone] Toll Free: [phone] Fax: [phone]

## 2020-01-27 ENCOUNTER — Other Ambulatory Visit: Payer: Self-pay

## 2020-01-27 NOTE — Patient Outreach (Signed)
[facility]) Care Management  01/27/2020  [name] 06/26/1932 [id number]   Telephone Screen  Referral Date: 01/26/2020 Referral Source: Nurse Line Referral Reason: "Called client back. She was wanting to know if her BP at 131/83 is ok. She stated she is on BP medication and her normal BP is 138/61. She denies any headaches, chest pain, dizziness and states she feels fine. Advised she could have eaten something salty the night before but she should still see her MD within 2 weeks and she stated she will."  Insurance: [employer]   Unsuccessful outreach attempt #2 to patient.     Plan: RN CM will make outreach attempt to patient within 3-4 business days if no return call.    Antionette Fairy, RN,BSN,CCM [facility] Care Management Telephonic Care Management Coordinator Direct Phone: [phone] Toll Free: [phone] Fax: [phone]

## 2020-01-28 ENCOUNTER — Other Ambulatory Visit: Payer: Self-pay

## 2020-01-28 NOTE — Patient Outreach (Signed)
[facility]) Care Management  01/28/2020  [name] 1932/09/13 [id number]   Telephone Screen  Referral Date:01/26/2020 Referral Source:Nurse Line Referral Reason:"Called client back.She was wanting to know if her BP at 131/83 is ok.She stated she is on BP medication and her normal BP is 138/61.She denies any headaches, chest pain, dizziness and states she feels fine.Advised she could have eaten something salty the night before but she should still see her MD within 2 weeks and she stated she will." Insurance:[employer]   Outreach attempt # 3 to patient. No answer at present and voicemail message left.   Plan: Assigned RN CM will follow up with patient within 3-4 wks if no return call from patient.   Antionette Fairy, RN,BSN,CCM [facility] Care Management Telephonic Care Management Coordinator Direct Phone: [phone] Toll Free: [phone] Fax: [phone]

## 2020-01-31 ENCOUNTER — Other Ambulatory Visit: Payer: Self-pay

## 2020-01-31 NOTE — Telephone Encounter (Signed)
This encounter was created in error - please disregard.

## 2020-01-31 NOTE — Addendum Note (Signed)
Addended by: [name] on: 01/31/2020 09:14 AM   Modules accepted: Level of Service, SmartSet

## 2020-01-31 NOTE — Patient Outreach (Signed)
[facility]) Care Management  [facility] Care Manager  01/31/2020   [name] 1933-04-05 [id number]  Subjective: Incoming call from patient. She reports doing well.  She states that she had questions about her blood pressure and had called the nurse line.  She reports since then her blood pressure has been ok.  We discussed blood pressure parameters and importance of medications and monitoring. She states her blood sugar last check was 153.  We discussed diabetes control and encouraged patient to continue to monitor and record her sugars.  She verbalized understanding and voices no concerns.      Objective:   Encounter Medications:  Outpatient Encounter Medications as of 01/31/2020  Medication Sig  . ACCU-CHEK SOFTCLIX LANCETS lancets   . amLODipine (NORVASC) 10 MG tablet TAKE 1 TABLET EVERY DAY (Patient taking differently: Take 10 mg by mouth daily. )  . atorvastatin (LIPITOR) 40 MG tablet TAKE 1 TABLET BY MOUTH ONCE A DAY (Patient taking differently: Take 40 mg by mouth daily at 6 PM. )  . carvedilol (COREG) 12.5 MG tablet TAKE 1 TABLET (12.5 MG TOTAL) BY MOUTH 2 (TWO) TIMES DAILY WITH A MEAL (NEW DOSE)  . dabigatran (PRADAXA) 150 MG CAPS capsule Take 1 capsule (150 mg total) by mouth 2 (two) times daily.  . diclofenac sodium (VOLTAREN) 1 % GEL Apply 2 g topically 4 (four) times daily.   . fish oil-omega-3 fatty acids 1000 MG capsule Take 1 g by mouth daily.    Marland Kitchen glipiZIDE (GLUCOTROL) 10 MG tablet Take 10 mg by mouth 2 (two) times daily before a meal.    . latanoprost (XALATAN) 0.005 % ophthalmic solution Place 1 drop into both eyes at bedtime.  Marland Kitchen linagliptin (TRADJENTA) 5 MG TABS tablet Take 1 tablet (5 mg total) by mouth daily.  Marland Kitchen lisinopril (PRINIVIL,ZESTRIL) 10 MG tablet TAKE 1 TABLET DAILY (Patient taking differently: Take 10 mg by mouth daily. )  . metFORMIN (GLUCOPHAGE) 500 MG tablet Take 500 mg by mouth 2 (two) times daily with a meal.    . Multiple  Vitamins-Minerals (CENTRUM SILVER PO) Take 1 tablet by mouth 1 dose over 24 hours.    . TRUETEST TEST test strip   . [DISCONTINUED] iron polysaccharides (NIFEREX) 150 MG capsule Take 150 mg by mouth daily.    . [DISCONTINUED] topiramate (TOPAMAX) 15 MG capsule Take 15 mg by mouth 2 (two) times daily.     No facility-administered encounter medications on file as of 01/31/2020.    Functional Status:  In your present state of health, do you have any difficulty performing the following activities: 01/31/2020 05/07/2019  Hearing? N N  Vision? N N  Difficulty concentrating or making decisions? N N  Walking or climbing stairs? N N  Dressing or bathing? N N  Doing errands, shopping? N N  Preparing Food and eating ? - N  Using the Toilet? N N  In the past six months, have you accidently leaked urine? - N  Do you have problems with loss of bowel control? N N  Managing your Medications? N N  Managing your Finances? N N  Housekeeping or managing your Housekeeping? N N  Some recent data might be hidden    Fall/Depression Screening: Fall Risk  01/31/2020 08/30/2019 05/07/2019  Falls in the past year? 0 0 0  Number falls in past yr: - - -  Injury with Fall? - - -   [facility] 2/9 Scores 01/31/2020 08/30/2019 05/07/2019 05/03/2019  PHQ - 2  Score 0 0 0 0    Assessment:  Patient continues to manage chronic conditions and benefits from disease management support.   Goals Addressed            This Visit's Progress   . Monitor and Manage My Blood Sugar       Follow Up Date 05/14/20   - check blood sugar at prescribed times - check blood sugar if I feel it is too high or too low - enter blood sugar readings and medication or insulin into daily log - take the blood sugar log to all doctor visits    Why is this important?   Checking your blood sugar at home helps to keep it from getting very high or very low.  Writing the results in a diary or log helps the doctor know how to care for you.  Your  blood sugar log should have the time, date and the results.  Also, write down the amount of insulin or other medicine that you take.  Other information, like what you ate, exercise done and how you were feeling, will also be helpful.     Notes:        Plan:  RN CM will contact in January and patient agreeable.   Pam Leriche, RN, MSN [facility] Care Management Care Management Coordinator Direct Line [phone] Cell [phone] Toll Free: [phone]  Fax: [phone]

## 2020-02-07 DIAGNOSIS — Z1231 Encounter for screening mammogram for malignant neoplasm of breast: Secondary | ICD-10-CM | POA: Diagnosis not present

## 2020-02-07 DIAGNOSIS — R6889 Other general symptoms and signs: Secondary | ICD-10-CM | POA: Diagnosis not present

## 2020-02-09 DIAGNOSIS — R6889 Other general symptoms and signs: Secondary | ICD-10-CM | POA: Diagnosis not present

## 2020-02-12 ENCOUNTER — Other Ambulatory Visit: Payer: Self-pay

## 2020-02-12 ENCOUNTER — Encounter (HOSPITAL_COMMUNITY): Payer: Self-pay

## 2020-02-12 ENCOUNTER — Inpatient Hospital Stay (HOSPITAL_COMMUNITY)
Admission: EM | Admit: 2020-02-12 | Discharge: 2020-02-16 | DRG: 310 | Disposition: A | Discharge status: Home Health | Payer: Medicare HMO | Attending: Internal Medicine | Admitting: Internal Medicine

## 2020-02-12 DIAGNOSIS — E1165 Type 2 diabetes mellitus with hyperglycemia: Secondary | ICD-10-CM | POA: Diagnosis present

## 2020-02-12 DIAGNOSIS — I471 Supraventricular tachycardia: Secondary | ICD-10-CM | POA: Diagnosis present

## 2020-02-12 DIAGNOSIS — R000 Tachycardia, unspecified: Secondary | ICD-10-CM | POA: Diagnosis not present

## 2020-02-12 DIAGNOSIS — I480 Paroxysmal atrial fibrillation: Secondary | ICD-10-CM | POA: Diagnosis present

## 2020-02-12 DIAGNOSIS — Z833 Family history of diabetes mellitus: Secondary | ICD-10-CM

## 2020-02-12 DIAGNOSIS — E785 Hyperlipidemia, unspecified: Secondary | ICD-10-CM | POA: Diagnosis present

## 2020-02-12 DIAGNOSIS — Z79899 Other long term (current) drug therapy: Secondary | ICD-10-CM

## 2020-02-12 DIAGNOSIS — D649 Anemia, unspecified: Secondary | ICD-10-CM | POA: Diagnosis present

## 2020-02-12 DIAGNOSIS — E1151 Type 2 diabetes mellitus with diabetic peripheral angiopathy without gangrene: Secondary | ICD-10-CM | POA: Diagnosis present

## 2020-02-12 DIAGNOSIS — I739 Peripheral vascular disease, unspecified: Secondary | ICD-10-CM | POA: Diagnosis present

## 2020-02-12 DIAGNOSIS — Z7984 Long term (current) use of oral hypoglycemic drugs: Secondary | ICD-10-CM

## 2020-02-12 DIAGNOSIS — Z820 Family history of epilepsy and other diseases of the nervous system: Secondary | ICD-10-CM | POA: Diagnosis not present

## 2020-02-12 DIAGNOSIS — Z8673 Personal history of transient ischemic attack (TIA), and cerebral infarction without residual deficits: Secondary | ICD-10-CM

## 2020-02-12 DIAGNOSIS — Z86718 Personal history of other venous thrombosis and embolism: Secondary | ICD-10-CM

## 2020-02-12 DIAGNOSIS — Z7901 Long term (current) use of anticoagulants: Secondary | ICD-10-CM | POA: Diagnosis not present

## 2020-02-12 DIAGNOSIS — I4892 Unspecified atrial flutter: Principal | ICD-10-CM | POA: Diagnosis present

## 2020-02-12 DIAGNOSIS — E1169 Type 2 diabetes mellitus with other specified complication: Secondary | ICD-10-CM | POA: Diagnosis present

## 2020-02-12 DIAGNOSIS — Z792 Long term (current) use of antibiotics: Secondary | ICD-10-CM

## 2020-02-12 DIAGNOSIS — Z8249 Family history of ischemic heart disease and other diseases of the circulatory system: Secondary | ICD-10-CM

## 2020-02-12 DIAGNOSIS — Z823 Family history of stroke: Secondary | ICD-10-CM

## 2020-02-12 DIAGNOSIS — Z20822 Contact with and (suspected) exposure to covid-19: Secondary | ICD-10-CM | POA: Diagnosis present

## 2020-02-12 DIAGNOSIS — E7800 Pure hypercholesterolemia, unspecified: Secondary | ICD-10-CM | POA: Diagnosis present

## 2020-02-12 DIAGNOSIS — E119 Type 2 diabetes mellitus without complications: Secondary | ICD-10-CM | POA: Diagnosis not present

## 2020-02-12 DIAGNOSIS — I10 Essential (primary) hypertension: Secondary | ICD-10-CM | POA: Diagnosis present

## 2020-02-12 LAB — RESPIRATORY PANEL BY RT PCR (FLU A&B, COVID)
Influenza A by PCR: NEGATIVE
Influenza B by PCR: NEGATIVE
SARS Coronavirus 2 by RT PCR: NEGATIVE

## 2020-02-12 LAB — CBC WITH DIFFERENTIAL/PLATELET
Abs Immature Granulocytes: 0.01 10*3/uL (ref 0.00–0.07)
Basophils Absolute: 0 10*3/uL (ref 0.0–0.1)
Basophils Relative: 0 %
Eosinophils Absolute: 0.1 10*3/uL (ref 0.0–0.5)
Eosinophils Relative: 1 %
HCT: 29.6 % — ABNORMAL LOW (ref 36.0–46.0)
Hemoglobin: 9.8 g/dL — ABNORMAL LOW (ref 12.0–15.0)
Immature Granulocytes: 0 %
Lymphocytes Relative: 20 %
Lymphs Abs: 0.9 10*3/uL (ref 0.7–4.0)
MCH: 31.7 pg (ref 26.0–34.0)
MCHC: 33.1 g/dL (ref 30.0–36.0)
MCV: 95.8 fL (ref 80.0–100.0)
Monocytes Absolute: 0.6 10*3/uL (ref 0.1–1.0)
Monocytes Relative: 12 %
Neutro Abs: 3.1 10*3/uL (ref 1.7–7.7)
Neutrophils Relative %: 67 %
Platelets: 195 10*3/uL (ref 150–400)
RBC: 3.09 MIL/uL — ABNORMAL LOW (ref 3.87–5.11)
RDW: 15.9 % — ABNORMAL HIGH (ref 11.5–15.5)
WBC: 4.7 10*3/uL (ref 4.0–10.5)
nRBC: 0 % (ref 0.0–0.2)

## 2020-02-12 LAB — COMPREHENSIVE METABOLIC PANEL
ALT: 33 U/L (ref 0–44)
AST: 32 U/L (ref 15–41)
Albumin: 4 g/dL (ref 3.5–5.0)
Alkaline Phosphatase: 66 U/L (ref 38–126)
Anion gap: 11 (ref 5–15)
BUN: 19 mg/dL (ref 8–23)
CO2: 23 mmol/L (ref 22–32)
Calcium: 9.5 mg/dL (ref 8.9–10.3)
Chloride: 105 mmol/L (ref 98–111)
Creatinine, Ser: 0.82 mg/dL (ref 0.44–1.00)
GFR, Estimated: >60 mL/min (ref >60)
Glucose, Bld: 262 mg/dL — ABNORMAL HIGH (ref 70–99)
Potassium: 3.9 mmol/L (ref 3.5–5.1)
Sodium: 139 mmol/L (ref 135–145)
Total Bilirubin: 0.6 mg/dL (ref 0.3–1.2)
Total Protein: 7.8 g/dL (ref 6.5–8.1)

## 2020-02-12 LAB — TSH: TSH: 2.06 u[IU]/mL (ref 0.350–4.500)

## 2020-02-12 LAB — MAGNESIUM: Magnesium: 1.5 mg/dL — ABNORMAL LOW (ref 1.7–2.4)

## 2020-02-12 LAB — CBG MONITORING, ED
Glucose-Capillary: 87 mg/dL (ref 70–99)
Glucose-Capillary: 185 mg/dL — ABNORMAL HIGH (ref 70–99)

## 2020-02-12 MED ORDER — AMOXICILLIN 250 MG PO CAPS
500.0000 mg | ORAL_CAPSULE | Freq: Three times a day (TID) | ORAL | Status: DC
  Administered 2020-02-12 – 2020-02-14 (×7): 500 mg via ORAL
  Filled 2020-02-12: qty 1
  Filled 2020-02-12 (×3): qty 2
  Filled 2020-02-12: qty 1
  Filled 2020-02-12 (×3): qty 2

## 2020-02-12 MED ORDER — METOPROLOL TARTRATE 5 MG/5ML IV SOLN
5.0000 mg | Freq: Once | INTRAVENOUS | Status: AC
  Administered 2020-02-12: 5 mg via INTRAVENOUS
  Filled 2020-02-12: qty 5

## 2020-02-12 MED ORDER — ATORVASTATIN CALCIUM 40 MG PO TABS
40.0000 mg | ORAL_TABLET | Freq: Every day | ORAL | Status: DC
  Administered 2020-02-12 – 2020-02-16 (×5): 40 mg via ORAL
  Filled 2020-02-12 (×5): qty 1

## 2020-02-12 MED ORDER — OMEGA-3-ACID ETHYL ESTERS 1 G PO CAPS
1.0000 g | ORAL_CAPSULE | Freq: Every day | ORAL | Status: DC
  Administered 2020-02-13 – 2020-02-16 (×4): 1 g via ORAL
  Filled 2020-02-12 (×4): qty 1

## 2020-02-12 MED ORDER — LINAGLIPTIN 5 MG PO TABS
5.0000 mg | ORAL_TABLET | Freq: Every day | ORAL | Status: DC
  Administered 2020-02-13: 5 mg via ORAL
  Filled 2020-02-12: qty 1

## 2020-02-12 MED ORDER — DILTIAZEM HCL 25 MG/5ML IV SOLN
15.0000 mg | Freq: Once | INTRAVENOUS | Status: AC
  Administered 2020-02-12: 15 mg via INTRAVENOUS
  Filled 2020-02-12: qty 5

## 2020-02-12 MED ORDER — METFORMIN HCL 500 MG PO TABS
1000.0000 mg | ORAL_TABLET | Freq: Two times a day (BID) | ORAL | Status: DC
  Administered 2020-02-13: 1000 mg via ORAL
  Filled 2020-02-12: qty 2

## 2020-02-12 MED ORDER — CARVEDILOL 12.5 MG PO TABS: 12.5000 mg | ORAL_TABLET | Freq: Two times a day (BID) | ORAL | Status: DC

## 2020-02-12 MED ORDER — MAGNESIUM SULFATE IN D5W 1-5 GM/100ML-% IV SOLN
1.0000 g | Freq: Once | INTRAVENOUS | Status: AC
  Administered 2020-02-12: 1 g via INTRAVENOUS
  Filled 2020-02-12: qty 100

## 2020-02-12 MED ORDER — DILTIAZEM HCL 25 MG/5ML IV SOLN
10.0000 mg | Freq: Once | INTRAVENOUS | Status: AC
  Administered 2020-02-12: 10 mg via INTRAVENOUS
  Filled 2020-02-12: qty 5

## 2020-02-12 MED ORDER — LATANOPROST 0.005 % OP SOLN
1.0000 [drp] | Freq: Every day | OPHTHALMIC | Status: DC
  Administered 2020-02-12 – 2020-02-15 (×4): 1 [drp] via OPHTHALMIC
  Filled 2020-02-12: qty 2.5

## 2020-02-12 MED ORDER — DABIGATRAN ETEXILATE MESYLATE 150 MG PO CAPS
150.0000 mg | ORAL_CAPSULE | Freq: Two times a day (BID) | ORAL | Status: DC
  Administered 2020-02-12 – 2020-02-16 (×8): 150 mg via ORAL
  Filled 2020-02-12 (×8): qty 1

## 2020-02-12 MED ORDER — GLIPIZIDE 10 MG PO TABS
10.0000 mg | ORAL_TABLET | Freq: Two times a day (BID) | ORAL | Status: DC
  Administered 2020-02-13: 10 mg via ORAL
  Filled 2020-02-12: qty 1

## 2020-02-12 NOTE — ED Provider Notes (Addendum)
Medical screening examination/treatment/procedure(s) were conducted as a shared visit with non-physician practitioner(s) and myself.  I personally evaluated the patient during the encounter.  EKG Interpretation  Date/Time:  Saturday February 12 2020 13:27:27 EDT Ventricular Rate:  116 PR Interval:    QRS Duration: 71 QT Interval:  296 QTC Calculation: 412 R Axis:   62 Text Interpretation: Ectopic atrial tachycardia, unifocal Low voltage, precordial leads Minimal ST depression, inferior leads 12 Lead; Mason-Likar Since last tracing non-sinus atrial pacing is evident Otherwise no significant change Confirmed by Mancel Bale [phone]) on 02/12/2020 1:43:47 PM  Patient has history of atrial fibrillation anticoagulated and taking carvedilol 12.5 mg twice a day.  She monitors her heart rate at home periodically.  She reports yesterday she noted her heart rate to be unusually high.  She was not having any symptoms.  This morning she checked the heart rate was 140.  She then gave it about an hour checked again it was 145.  She contacted her health care nurse who recommended evaluation of the emergency department.  She does not have any shortness of breath or feel lightheaded or dizzy.  Alert and appropriate.  No respiratory distress.  Mental status normal.  Heart extreme tachycardia without appreciable murmur gallop.  Distal pulses 2+.  No peripheral edema.  Extremities warm and dry.  Lungs are clear without crackles.  We will proceed with additional dose of metoprolol 5 mg IV.  Will reassess for rate change.  Anticipate phone consultation with cardiology.  Patient given Toprol 5 mg IV x2. Pressure remained stable, patient mains asymptomatic but heart rate remains elevated in the 130s. Additional 10 mg IV of Cardizem administered. No significant change. Is been reviewed with cardiology by Trudie Reed. Plan will be for cardiology admission for persistent atrial flutter, not responding to  combination rate  control treatment.  Patient taking amoxicillin for recent dental procedure.  We will continue this. 20: 23 patient remains clinically asymptomatic.  Heart rate ranging from 110s-130s.  Will order 15 mg Cardizem.  Patient awaiting bed assignment at [facility].   Arby Barrette, MD 02/12/20 1420    Arby Barrette, MD 02/12/20 Jacklynn Barnacle    Arby Barrette, MD 02/12/20 2024

## 2020-02-12 NOTE — ED Provider Notes (Addendum)
[facility]-EMERGENCY DEPT Provider Note   CSN: [id number] Arrival date & time: 02/12/20  1311     History No chief complaint on file.   [name] is a 86 y.o. female.  HPI Patient is an 86 year old female with history of paroxysmal A. fib, DM, HLD, HTN, PAD, stroke.   Patient is presented today with chief complaint of elevated heart rate.  She checked her heart rate this morning when she had a blood pressure checked and states that it was low 140s and then rechecked about an hour later and it was 145.  She states she had no symptoms of lightheadedness, dizziness, fatigue, malaise, no fevers or chills cough cold congestion, chest pain, shortness of breath abdominal pain.  She states that she recalls yesterday saying that her heart rate was somewhat elevated as well.  Because of persistence of the elevated heart rate she called "the nurse" who recommended she come to the emergency department for evaluation.  She states that she is seen by Dr. Jens Som of [facility].  She states that she has been taking her carvedilol twice daily as prescribed and has had no missed doses.  She also takes her dabigatran as prescribed and has no missed doses of this medication either.  She has had normal intake denies any dehydration states that she has been eating fine does not feel that she is losing weight.      Past Medical History:  Diagnosis Date  . Atrial fibrillation (HCC)   . Diabetes mellitus   . DVT (deep venous thrombosis) (HCC)   . Hyperlipidemia   . Hypertension   . Leg pain   . Peripheral arterial disease (HCC)   . Stroke [facility]) 1999    Patient Active Problem List   Diagnosis Date Noted  . Atrial flutter (HCC) 02/12/2020  . Cramp of both lower extremities 12/15/2014  . Foot cramps 12/15/2014  . Stiffness of foot 06/10/2014  . Aftercare following surgery of the circulatory system, NEC 12/09/2013  . Peripheral vascular disease (HCC) 10/03/2011  . Bypass graft  stenosis (HCC) 07/04/2011  . Chronic total occlusion of artery of the extremities (HCC) 07/04/2011  . PURE HYPERCHOLESTEROLEMIA 03/29/2009  . ESSENTIAL HYPERTENSION, MALIGNANT 03/29/2009  . ESSENTIAL HYPERTENSION, BENIGN 03/28/2009  . DIABETES MELLITUS 08/30/2008  . HYPERLIPIDEMIA-MIXED 08/30/2008  . ATRIAL FIBRILLATION 08/30/2008  . SYNCOPE AND COLLAPSE 08/30/2008    Past Surgical History:  Procedure Laterality Date  . BACK SURGERY  1996  . CATARACT EXTRACTION  2000  . CESAREAN SECTION    . EYE SURGERY    . FEMORAL BYPASS Left July 23, 2010  . LUMBAR DISC SURGERY    . SPINE SURGERY       OB History   No obstetric history on file.     Family History  Problem Relation Age of Onset  . Hypertension Mother   . Heart attack Mother   . Diabetes Father   . Hypertension Father   . Stroke Father   . Stroke Sister   . Diabetes Sister   . Heart disease Sister        Before age 87-Aneurysm  . Dementia Sister   . Alzheimer's disease Sister   . Diabetes Brother   . Diabetes Brother   . Heart disease Brother        Before age 64  . Heart attack Brother   . Heart attack Sister   . Diabetes Sister   . Alzheimer's disease Sister  Social History   Tobacco Use  . Smoking status: Never Smoker  . Smokeless tobacco: Never Used  Vaping Use  . Vaping Use: Never used  Substance Use Topics  . Alcohol use: No  . Drug use: No    Home Medications Prior to Admission medications   Medication Sig Start Date End Date Taking? Authorizing Provider  amLODipine (NORVASC) 10 MG tablet TAKE 1 TABLET EVERY DAY Patient taking differently: Take 10 mg by mouth daily.  11/04/11  Yes Lewayne Bunting, MD  amoxicillin (AMOXIL) 500 MG capsule Take 500 mg by mouth 3 (three) times daily. Start date : 02/09/20 02/09/20  Yes [provider]  atorvastatin (LIPITOR) 40 MG tablet TAKE 1 TABLET BY MOUTH ONCE A DAY Patient taking differently: Take 40 mg by mouth daily.  06/17/11  Yes Lewayne Bunting, MD  carvedilol (COREG) 12.5 MG tablet TAKE 1 TABLET (12.5 MG TOTAL) BY MOUTH 2 (TWO) TIMES DAILY WITH A MEAL (NEW DOSE) Patient taking differently: Take 12.5 mg by mouth 2 (two) times daily with a meal.  11/12/19  Yes Azalee Course, PA  cholecalciferol (VITAMIN D3) 25 MCG (1000 UNIT) tablet Take 1,000 Units by mouth daily.   Yes [provider]  dabigatran (PRADAXA) 150 MG CAPS capsule Take 1 capsule (150 mg total) by mouth 2 (two) times daily. 04/23/19  Yes Lewayne Bunting, MD  fish oil-omega-3 fatty acids 1000 MG capsule Take 1 g by mouth daily.     Yes [provider]  glipiZIDE (GLUCOTROL) 10 MG tablet Take 10 mg by mouth 2 (two) times daily before a meal.     Yes [provider]  latanoprost (XALATAN) 0.005 % ophthalmic solution Place 1 drop into both eyes at bedtime.   Yes [provider]  linagliptin (TRADJENTA) 5 MG TABS tablet Take 1 tablet (5 mg total) by mouth daily. 05/20/17  Yes Lewayne Bunting, MD  lisinopril (PRINIVIL,ZESTRIL) 10 MG tablet TAKE 1 TABLET DAILY Patient taking differently: Take 10 mg by mouth daily.  12/03/11  Yes Lewayne Bunting, MD  metFORMIN (GLUCOPHAGE) 500 MG tablet Take 1,000 mg by mouth 2 (two) times daily with a meal.    Yes [provider]  ACCU-CHEK SOFTCLIX LANCETS lancets  01/16/18   [provider]  TRUETEST TEST test strip  02/22/13   [provider]  iron polysaccharides (NIFEREX) 150 MG capsule Take 150 mg by mouth daily.    07/04/11  [provider]  topiramate (TOPAMAX) 15 MG capsule Take 15 mg by mouth 2 (two) times daily.    07/04/11  [provider]    Allergies    Patient has no known allergies.  Review of Systems   Review of Systems  Constitutional: Negative for chills and fever.  HENT: Negative for congestion.   Eyes: Negative for pain.  Respiratory: Negative for cough and shortness of breath.   Cardiovascular: Negative for chest pain and leg swelling.        Tachycardia (noted by machine)  Gastrointestinal: Negative for abdominal pain and vomiting.  Genitourinary: Negative for dysuria.  Musculoskeletal: Negative for myalgias.  Skin: Negative for rash.  Neurological: Negative for dizziness and headaches.    Physical Exam Updated Vital Signs BP 122/72   Pulse (!) 132   Temp 97.7 F (36.5 C) (Oral)   Resp 16   Ht 4\' 11"  (1.499 m)   Wt 48.1 kg   SpO2 100%   BMI 21.41 kg/m   Physical  Exam Vitals and nursing note reviewed.  Constitutional:      General: She is not in acute distress.    Comments: Pleasant well-appearing 86 year old.  Somewhat emaciated appearing.  In no acute distress.  Sitting comfortably in bed.  Able answer questions appropriately follow commands. No increased work of breathing. Speaking in full sentences.  HENT:     Head: Normocephalic and atraumatic.     Nose: Nose normal.  Eyes:     General: No scleral icterus. Cardiovascular:     Rate and Rhythm: Regular rhythm. Tachycardia present.     Pulses: Normal pulses.     Heart sounds: Normal heart sounds.     Comments: Regular rapid heart rate of approximately 130-140 Pulmonary:     Effort: Pulmonary effort is normal. No respiratory distress.     Breath sounds: Normal breath sounds. No wheezing.     Comments: No crackles auscultated. Abdominal:     Palpations: Abdomen is soft.     Tenderness: There is no abdominal tenderness. There is no guarding or rebound.  Musculoskeletal:     Cervical back: Normal range of motion.     Right lower leg: No edema.     Left lower leg: No edema.  Skin:    General: Skin is warm and dry.     Capillary Refill: Capillary refill takes less than 2 seconds.  Neurological:     Mental Status: She is alert. Mental status is at baseline.  Psychiatric:        Mood and Affect: Mood normal.        Behavior: Behavior normal.     ED Results / Procedures / Treatments   Labs (all labs ordered are listed, but only abnormal results are  displayed) Labs Reviewed  MAGNESIUM - Abnormal; Notable for the following components:      Result Value   Magnesium 1.5 (*)    All other components within normal limits  CBC WITH DIFFERENTIAL/PLATELET - Abnormal; Notable for the following components:   RBC 3.09 (*)    Hemoglobin 9.8 (*)    HCT 29.6 (*)    RDW 15.9 (*)    All other components within normal limits  COMPREHENSIVE METABOLIC PANEL - Abnormal; Notable for the following components:   Glucose, Bld 262 (*)    All other components within normal limits  RESPIRATORY PANEL BY RT PCR (FLU A&B, COVID)  TSH    EKG EKG Interpretation  Date/Time:  Saturday February 12 2020 13:27:27 EDT Ventricular Rate:  116 PR Interval:    QRS Duration: 71 QT Interval:  296 QTC Calculation: 412 R Axis:   62 Text Interpretation: Ectopic atrial tachycardia, unifocal Low voltage, precordial leads Minimal ST depression, inferior leads 12 Lead; Mason-Likar Since last tracing non-sinus atrial pacing is evident Otherwise no significant change Confirmed by Mancel Bale [phone]) on 02/12/2020 1:43:47 PM   Radiology No results found.  Procedures Procedures (including critical care time)  Medications Ordered in ED Medications  metoprolol tartrate (LOPRESSOR) injection 5 mg (5 mg Intravenous Given 02/12/20 1420)  magnesium sulfate IVPB 1 g 100 mL (0 g Intravenous Stopped 02/12/20 1658)  metoprolol tartrate (LOPRESSOR) injection 5 mg (5 mg Intravenous Given 02/12/20 1555)  diltiazem (CARDIZEM) injection 10 mg (10 mg Intravenous Given 02/12/20 1726)    ED Course  I have reviewed the triage vital signs and the nursing notes.  Pertinent labs & imaging results that were available during my care of the patient were reviewed by me and considered in  my medical decision making (see chart for details).    MDM Rules/Calculators/A&P                          86 year old female with past medical history developed specifically pertinent for history of PAF  on Pradaxa and carvedilol she has been taking medications as prescribed.  Here for asymptomatic tachycardia.  Heart rate has been between 130-140 for most of her visit so far.  Physical exam unremarkable apart from tachycardia.  Is regular.  CMP without any significant abnormalities apart from hyperglycemia.  CBC without leukocytosis or significant/new anemia.  It is a mildly low this was repleted IV TSH within normal limits.  Covid swab obtained and pending.  I discussed this case with my attending physician who cosigned this note including patient's presenting symptoms, physical exam, and planned diagnostics and interventions. Attending physician stated agreement with plan or made changes to plan which were implemented.   Attending physician assessed patient at bedside.  Discussed with Dr. Tresa Endo of cardiology who recommended an additional 5 mg dose of metoprolol and monitor if no change could attempt 10 mg dose of Cardizem.  Anticipate following up with the cardiology fellow this evening.  Patient has received 2x 5 mg doses of metoprolol as well as 10 milligrams dose of carvedilol.  Did not convert chemically.  Discussed with Dr. Carollee Sires - recommends admission to cardiology.  No further recommendations were controlled this time.  Will admit to [facility].  Temporary admission orders were placed.  Patient is agreeable to plan and understands need for transfer and admission at [facility].   Final Clinical Impression(s) / ED Diagnoses Final diagnoses:  None    Rx / DC Orders ED Discharge Orders    None       Gailen Shelter, [place] 02/12/20 1911    Gailen Shelter, [place] 02/12/20 1912    Arby Barrette, MD 02/12/20 2009

## 2020-02-12 NOTE — ED Triage Notes (Signed)
Pt reports she took her BP this morning and found that her HR was 138. She called her nurse and was instructed to speak with her doctor who recommended she come into the ED to be checked out. Pt states she received her flu vaccine and her COVID booster Thursday.

## 2020-02-12 NOTE — ED Notes (Signed)
CBG 87. 

## 2020-02-13 DIAGNOSIS — Z86718 Personal history of other venous thrombosis and embolism: Secondary | ICD-10-CM | POA: Diagnosis not present

## 2020-02-13 DIAGNOSIS — Z833 Family history of diabetes mellitus: Secondary | ICD-10-CM | POA: Diagnosis not present

## 2020-02-13 DIAGNOSIS — Z20822 Contact with and (suspected) exposure to covid-19: Secondary | ICD-10-CM | POA: Diagnosis present

## 2020-02-13 DIAGNOSIS — Z7901 Long term (current) use of anticoagulants: Secondary | ICD-10-CM | POA: Diagnosis not present

## 2020-02-13 DIAGNOSIS — E1165 Type 2 diabetes mellitus with hyperglycemia: Secondary | ICD-10-CM | POA: Diagnosis present

## 2020-02-13 DIAGNOSIS — Z8249 Family history of ischemic heart disease and other diseases of the circulatory system: Secondary | ICD-10-CM | POA: Diagnosis not present

## 2020-02-13 DIAGNOSIS — I471 Supraventricular tachycardia: Secondary | ICD-10-CM | POA: Diagnosis present

## 2020-02-13 DIAGNOSIS — I480 Paroxysmal atrial fibrillation: Secondary | ICD-10-CM | POA: Diagnosis present

## 2020-02-13 DIAGNOSIS — I4892 Unspecified atrial flutter: Secondary | ICD-10-CM | POA: Diagnosis present

## 2020-02-13 DIAGNOSIS — Z8673 Personal history of transient ischemic attack (TIA), and cerebral infarction without residual deficits: Secondary | ICD-10-CM | POA: Diagnosis not present

## 2020-02-13 DIAGNOSIS — E1169 Type 2 diabetes mellitus with other specified complication: Secondary | ICD-10-CM

## 2020-02-13 DIAGNOSIS — D649 Anemia, unspecified: Secondary | ICD-10-CM | POA: Diagnosis present

## 2020-02-13 DIAGNOSIS — Z823 Family history of stroke: Secondary | ICD-10-CM | POA: Diagnosis not present

## 2020-02-13 DIAGNOSIS — E1151 Type 2 diabetes mellitus with diabetic peripheral angiopathy without gangrene: Secondary | ICD-10-CM | POA: Diagnosis present

## 2020-02-13 DIAGNOSIS — I10 Essential (primary) hypertension: Secondary | ICD-10-CM | POA: Diagnosis not present

## 2020-02-13 DIAGNOSIS — E785 Hyperlipidemia, unspecified: Secondary | ICD-10-CM | POA: Diagnosis present

## 2020-02-13 DIAGNOSIS — Z792 Long term (current) use of antibiotics: Secondary | ICD-10-CM | POA: Diagnosis not present

## 2020-02-13 DIAGNOSIS — Z79899 Other long term (current) drug therapy: Secondary | ICD-10-CM | POA: Diagnosis not present

## 2020-02-13 DIAGNOSIS — Z7984 Long term (current) use of oral hypoglycemic drugs: Secondary | ICD-10-CM | POA: Diagnosis not present

## 2020-02-13 DIAGNOSIS — E7800 Pure hypercholesterolemia, unspecified: Secondary | ICD-10-CM | POA: Diagnosis present

## 2020-02-13 DIAGNOSIS — Z820 Family history of epilepsy and other diseases of the nervous system: Secondary | ICD-10-CM | POA: Diagnosis not present

## 2020-02-13 LAB — BASIC METABOLIC PANEL
Anion gap: 12 (ref 5–15)
BUN: 14 mg/dL (ref 8–23)
CO2: 23 mmol/L (ref 22–32)
Calcium: 9.6 mg/dL (ref 8.9–10.3)
Chloride: 105 mmol/L (ref 98–111)
Creatinine, Ser: 0.8 mg/dL (ref 0.44–1.00)
GFR, Estimated: >60 mL/min (ref >60)
Glucose, Bld: 198 mg/dL — ABNORMAL HIGH (ref 70–99)
Potassium: 3.8 mmol/L (ref 3.5–5.1)
Sodium: 140 mmol/L (ref 135–145)

## 2020-02-13 LAB — LIPID PANEL
Cholesterol: 120 mg/dL (ref 0–200)
HDL: 46 mg/dL (ref >40)
LDL Cholesterol: 62 mg/dL (ref 0–99)
Total CHOL/HDL Ratio: 2.6 RATIO
Triglycerides: 60 mg/dL (ref <150)
VLDL: 12 mg/dL (ref 0–40)

## 2020-02-13 LAB — CBC WITH DIFFERENTIAL/PLATELET
Abs Immature Granulocytes: 0.02 10*3/uL (ref 0.00–0.07)
Basophils Absolute: 0 10*3/uL (ref 0.0–0.1)
Basophils Relative: 0 %
Eosinophils Absolute: 0.1 10*3/uL (ref 0.0–0.5)
Eosinophils Relative: 1 %
HCT: 33.7 % — ABNORMAL LOW (ref 36.0–46.0)
Hemoglobin: 11 g/dL — ABNORMAL LOW (ref 12.0–15.0)
Immature Granulocytes: 0 %
Lymphocytes Relative: 19 %
Lymphs Abs: 1.3 10*3/uL (ref 0.7–4.0)
MCH: 31.3 pg (ref 26.0–34.0)
MCHC: 32.6 g/dL (ref 30.0–36.0)
MCV: 95.7 fL (ref 80.0–100.0)
Monocytes Absolute: 0.8 10*3/uL (ref 0.1–1.0)
Monocytes Relative: 11 %
Neutro Abs: 5 10*3/uL (ref 1.7–7.7)
Neutrophils Relative %: 69 %
Platelets: 206 10*3/uL (ref 150–400)
RBC: 3.52 MIL/uL — ABNORMAL LOW (ref 3.87–5.11)
RDW: 16.1 % — ABNORMAL HIGH (ref 11.5–15.5)
WBC: 7.2 10*3/uL (ref 4.0–10.5)
nRBC: 0 % (ref 0.0–0.2)

## 2020-02-13 LAB — GLUCOSE, CAPILLARY
Glucose-Capillary: 237 mg/dL — ABNORMAL HIGH (ref 70–99)
Glucose-Capillary: 205 mg/dL — ABNORMAL HIGH (ref 70–99)

## 2020-02-13 LAB — HEMOGLOBIN A1C
Hgb A1c MFr Bld: 8.3 % — ABNORMAL HIGH (ref 4.8–5.6)
Mean Plasma Glucose: 191.51 mg/dL

## 2020-02-13 LAB — BRAIN NATRIURETIC PEPTIDE: B Natriuretic Peptide: 222.4 pg/mL — ABNORMAL HIGH (ref 0.0–100.0)

## 2020-02-13 LAB — TSH: TSH: 2.718 u[IU]/mL (ref 0.350–4.500)

## 2020-02-13 LAB — CBG MONITORING, ED: Glucose-Capillary: 185 mg/dL — ABNORMAL HIGH (ref 70–99)

## 2020-02-13 LAB — MAGNESIUM: Magnesium: 1.7 mg/dL (ref 1.7–2.4)

## 2020-02-13 MED ORDER — ONDANSETRON HCL 4 MG/2ML IJ SOLN: 4.0000 mg | Freq: Four times a day (QID) | INTRAMUSCULAR | Status: DC | PRN

## 2020-02-13 MED ORDER — METOPROLOL TARTRATE 25 MG PO TABS
25.0000 mg | ORAL_TABLET | Freq: Four times a day (QID) | ORAL | Status: DC
  Administered 2020-02-13: 25 mg via ORAL
  Filled 2020-02-13: qty 1

## 2020-02-13 MED ORDER — LISINOPRIL 10 MG PO TABS
10.0000 mg | ORAL_TABLET | Freq: Every day | ORAL | Status: DC
  Administered 2020-02-13 – 2020-02-16 (×4): 10 mg via ORAL
  Filled 2020-02-13 (×4): qty 1

## 2020-02-13 MED ORDER — DILTIAZEM HCL-DEXTROSE 125-5 MG/125ML-% IV SOLN (PREMIX)
5.0000 mg/h | INTRAVENOUS | Status: DC
  Administered 2020-02-13: 5 mg/h via INTRAVENOUS
  Administered 2020-02-13: 15 mg/h via INTRAVENOUS
  Administered 2020-02-14: 10 mg/h via INTRAVENOUS
  Filled 2020-02-13 (×3): qty 125

## 2020-02-13 MED ORDER — ACETAMINOPHEN 325 MG PO TABS
650.0000 mg | ORAL_TABLET | ORAL | Status: DC | PRN
  Administered 2020-02-16: 650 mg via ORAL
  Filled 2020-02-13: qty 2

## 2020-02-13 MED ORDER — DILTIAZEM LOAD VIA INFUSION
10.0000 mg | Freq: Once | INTRAVENOUS | Status: AC
  Administered 2020-02-13: 10 mg via INTRAVENOUS
  Filled 2020-02-13: qty 10

## 2020-02-13 MED ORDER — ACETAMINOPHEN 325 MG PO TABS: 650.0000 mg | ORAL_TABLET | ORAL | Status: DC | PRN

## 2020-02-13 MED ORDER — METOPROLOL TARTRATE 25 MG PO TABS
25.0000 mg | ORAL_TABLET | Freq: Once | ORAL | Status: AC
  Administered 2020-02-13: 25 mg via ORAL
  Filled 2020-02-13: qty 1

## 2020-02-13 MED ORDER — INSULIN GLARGINE 100 UNIT/ML ~~LOC~~ SOLN
5.0000 [IU] | Freq: Every day | SUBCUTANEOUS | Status: DC
  Administered 2020-02-13 – 2020-02-15 (×3): 5 [IU] via SUBCUTANEOUS
  Filled 2020-02-13 (×3): qty 0.05

## 2020-02-13 MED ORDER — METOPROLOL TARTRATE 50 MG PO TABS
50.0000 mg | ORAL_TABLET | Freq: Two times a day (BID) | ORAL | Status: DC
  Administered 2020-02-13 – 2020-02-16 (×6): 50 mg via ORAL
  Filled 2020-02-13 (×6): qty 1

## 2020-02-13 MED ORDER — INSULIN ASPART 100 UNIT/ML ~~LOC~~ SOLN
0.0000 [IU] | Freq: Three times a day (TID) | SUBCUTANEOUS | Status: DC
  Administered 2020-02-13 – 2020-02-14 (×3): 3 [IU] via SUBCUTANEOUS
  Administered 2020-02-14: 1 [IU] via SUBCUTANEOUS
  Administered 2020-02-15: 2 [IU] via SUBCUTANEOUS
  Administered 2020-02-15: 3 [IU] via SUBCUTANEOUS

## 2020-02-13 NOTE — ED Notes (Signed)
EMPTIED PURE WICK CANISTER

## 2020-02-13 NOTE — ED Notes (Signed)
Pt HR 140-160 sustaining. Pt asymptomatic.Cardiology informed and ordered EKG. Will continue to monitor.

## 2020-02-13 NOTE — Progress Notes (Signed)
   02/13/20 2200  Assess: MEWS Score  Temp (!) 97.5 F (36.4 C)  BP 125/71  Pulse Rate (!) 132  ECG Heart Rate (!) 122  Resp (!) 22  Level of Consciousness Alert  SpO2 98 %  O2 Device Room Air  Assess: MEWS Score  MEWS Temp 0  MEWS Systolic 0  MEWS Pulse 2  MEWS RR 1  MEWS LOC 0  MEWS Score 3  MEWS Score Color Yellow  Assess: if the MEWS score is Yellow or Red  Were vital signs taken at a resting state? Yes  Focused Assessment No change from prior assessment  Early Detection of Sepsis Score *See Row Information* Low  MEWS guidelines implemented *See Row Information* Yes  Treat  MEWS Interventions Administered scheduled meds/treatments  Pain Scale 0-10  Pain Score 0  Take Vital Signs  Increase Vital Sign Frequency  Yellow: Q 2hr X 2 then Q 4hr X 2, if remains yellow, continue Q 4hrs  Escalate  MEWS: Escalate Yellow: discuss with charge nurse/RN and consider discussing with provider and RRT  Notify: Charge Nurse/RN  Name of Charge Nurse/RN Notified Pam  Date Charge Nurse/RN Notified 02/13/20  Time Charge Nurse/RN Notified 2130  Document  Patient Outcome Stabilized after interventions  patient has been in the yellow MEWS for HR and RR.  Cardizem has been infused since ED,  Titrated at 1945 to max dose 15 mg/Hr, 50 mg metoprolol PO given at bedtime.continue tele monitoring and Q 30 minutes BP monitoring.  Patient is not in distress and resting in bed.

## 2020-02-13 NOTE — H&P (Signed)
History and Physical        Hospital Admission Note Date: 02/13/2020  Patient name: [name] Medical record number: [id number] Date of birth: [birth year] Age: 86 y.o. Gender: female  PCP: [name], MD  Patient coming from: Home Lives with: Alone At baseline, ambulates: Independently  Chief Complaint    No chief complaint on file.     HPI:   This is an 86 year old female with past medical history of paroxysmal atrial fibrillation on Pradaxa, diabetes, hypertension, hyperlipidemia, PAD, stroke, DVT who came to the ED on 10/30 with complaint of elevated heart rate.  Patient checks her vitals routinely throughout the week and noted her HR to be in the 140s, asymptomatic.  Patient called her doctor who recommended to come to the ED for evaluation.  She denies any chest pain, palpitations, nausea or vomiting or any other complaints at this time.  Of note, she states she has been taking amoxicillin for the past 1 to 2 days in anticipation of upcoming dental procedure.  ED Course: Afebrile, tachycardic up to 150s, otherwise stable on room air.  Labs overall unremarkable with the exception of glucose 262, TSH 2.06.  She was given beta-blockers without improvement.  She was noted to have SVT up to 116.  On 10/30 the ED provider discussed with Dr. Tresa Endo, cardiology who recommended additional dose of beta-blocker Cardizem bolus if no improvement.  She did not improve and the cardiology fellow was contacted who recommended initially transfer to [facility] admission to cardiology.  Cardiology was formally consulted on 10/31 who noted a flutter with RVR and recommended medicine admission.  Care continued at [facility] for now.  Vitals:   02/13/20 1200 02/13/20 1230  BP: 125/67 121/77  Pulse: 91 82  Resp: (!) 21 17  Temp:  (!) 97.5 F (36.4 C)  SpO2: 95% 98%     Review of Systems:  Review of  Systems  Constitutional: Negative for chills and fever.  Respiratory: Negative for shortness of breath and wheezing.   Cardiovascular: Negative for chest pain and palpitations.  Gastrointestinal: Negative for nausea and vomiting.  Genitourinary: Negative.   Musculoskeletal: Negative.   All other systems reviewed and are negative.   Medical/Social/Family History   Past Medical History: Past Medical History:  Diagnosis Date  . Atrial fibrillation (HCC)   . Diabetes mellitus   . DVT (deep venous thrombosis) (HCC)   . Hyperlipidemia   . Hypertension   . Leg pain   . Peripheral arterial disease (HCC)   . Stroke [facility]) 1999    Past Surgical History:  Procedure Laterality Date  . BACK SURGERY  1996  . CATARACT EXTRACTION  2000  . CESAREAN SECTION    . EYE SURGERY    . FEMORAL BYPASS Left July 23, 2010  . LUMBAR DISC SURGERY    . SPINE SURGERY      Medications: Prior to Admission medications   Medication Sig Start Date End Date Taking? Authorizing Provider  amLODipine (NORVASC) 10 MG tablet TAKE 1 TABLET EVERY DAY Patient taking differently: Take 10 mg by mouth daily.  11/04/11  Yes Lewayne Bunting, MD  amoxicillin (AMOXIL) 500 MG capsule Take 500 mg by mouth 3 (three)  times daily. Start date : 02/09/20 02/09/20  Yes [provider]  atorvastatin (LIPITOR) 40 MG tablet TAKE 1 TABLET BY MOUTH ONCE A DAY Patient taking differently: Take 40 mg by mouth daily.  06/17/11  Yes Lewayne Bunting, MD  carvedilol (COREG) 12.5 MG tablet TAKE 1 TABLET (12.5 MG TOTAL) BY MOUTH 2 (TWO) TIMES DAILY WITH A MEAL (NEW DOSE) Patient taking differently: Take 12.5 mg by mouth 2 (two) times daily with a meal.  11/12/19  Yes Azalee Course, PA  cholecalciferol (VITAMIN D3) 25 MCG (1000 UNIT) tablet Take 1,000 Units by mouth daily.   Yes [provider]  dabigatran (PRADAXA) 150 MG CAPS capsule Take 1 capsule (150 mg total) by mouth 2 (two) times daily. 04/23/19  Yes Lewayne Bunting, MD   fish oil-omega-3 fatty acids 1000 MG capsule Take 1 g by mouth daily.     Yes [provider]  glipiZIDE (GLUCOTROL) 10 MG tablet Take 10 mg by mouth 2 (two) times daily before a meal.     Yes [provider]  latanoprost (XALATAN) 0.005 % ophthalmic solution Place 1 drop into both eyes at bedtime.   Yes [provider]  linagliptin (TRADJENTA) 5 MG TABS tablet Take 1 tablet (5 mg total) by mouth daily. 05/20/17  Yes Lewayne Bunting, MD  lisinopril (PRINIVIL,ZESTRIL) 10 MG tablet TAKE 1 TABLET DAILY Patient taking differently: Take 10 mg by mouth daily.  12/03/11  Yes Lewayne Bunting, MD  metFORMIN (GLUCOPHAGE) 500 MG tablet Take 1,000 mg by mouth 2 (two) times daily with a meal.    Yes [provider]  ACCU-CHEK SOFTCLIX LANCETS lancets  01/16/18   [provider]  TRUETEST TEST test strip  02/22/13   [provider]  iron polysaccharides (NIFEREX) 150 MG capsule Take 150 mg by mouth daily.    07/04/11  [provider]  topiramate (TOPAMAX) 15 MG capsule Take 15 mg by mouth 2 (two) times daily.    07/04/11  [provider]    Allergies:  No Known Allergies  Social History:  reports that she has never smoked. She has never used smokeless tobacco. She reports that she does not drink alcohol and does not use drugs.  Family History: Family History  Problem Relation Age of Onset  . Hypertension Mother   . Heart attack Mother   . Diabetes Father   . Hypertension Father   . Stroke Father   . Stroke Sister   . Diabetes Sister   . Heart disease Sister        Before age 58-Aneurysm  . Dementia Sister   . Alzheimer's disease Sister   . Diabetes Brother   . Diabetes Brother   . Heart disease Brother        Before age 58  . Heart attack Brother   . Heart attack Sister   . Diabetes Sister   . Alzheimer's disease Sister      Objective   Physical Exam: Blood pressure 121/77, pulse 82, temperature (!) 97.5 F (36.4  C), temperature source Oral, resp. rate 17, height 4\' 11"  (1.499 m), weight 48.1 kg, SpO2 98 %.  Physical Exam Vitals and nursing note reviewed.  Constitutional:      Appearance: Normal appearance.  HENT:     Head: Normocephalic and atraumatic.  Eyes:     Conjunctiva/sclera: Conjunctivae normal.  Cardiovascular:     Rate and Rhythm: Tachycardia present.     Heart sounds: No murmur  heard.   Pulmonary:     Effort: Pulmonary effort is normal.     Breath sounds: Normal breath sounds.  Abdominal:     General: Abdomen is flat.     Palpations: Abdomen is soft.  Musculoskeletal:        General: No swelling or tenderness.  Skin:    Coloration: Skin is not jaundiced or pale.  Neurological:     Mental Status: She is alert. Mental status is at baseline.  Psychiatric:        Mood and Affect: Mood normal.        Behavior: Behavior normal.     LABS on Admission: I have personally reviewed all the labs and imaging below    Basic Metabolic Panel: Recent Labs  Lab 02/12/20 1401  NA 139  K 3.9  CL 105  CO2 23  GLUCOSE 262*  BUN 19  CREATININE 0.82  CALCIUM 9.5  MG 1.5*   Liver Function Tests: Recent Labs  Lab 02/12/20 1401  AST 32  ALT 33  ALKPHOS 66  BILITOT 0.6  PROT 7.8  ALBUMIN 4.0   No results for input(s): LIPASE, AMYLASE in the last 168 hours. No results for input(s): AMMONIA in the last 168 hours. CBC: Recent Labs  Lab 02/12/20 1401  WBC 4.7  NEUTROABS 3.1  HGB 9.8*  HCT 29.6*  MCV 95.8  PLT 195   Cardiac Enzymes: No results for input(s): CKTOTAL, CKMB, CKMBINDEX, TROPONINI in the last 168 hours. BNP: Invalid input(s): POCBNP CBG: Recent Labs  Lab 02/12/20 2133 02/13/20 0804  GLUCAP 185* 185*    Radiological Exams on Admission:  No results found.    EKG: Independently reviewed.    A & P   Principal Problem:   Atrial flutter with rapid ventricular response (HCC) Active Problems:   Type 2 diabetes mellitus with hyperlipidemia  (HCC)   Essential hypertension   Hypomagnesemia   1. Asymptomatic and hemodynamically stable atrial flutter with RVR a. CHA2DS2-VASc: 8 b. Currently anticoagulated with Pradaxa and does not skip doses c. TSH is normal d. No echo on file e. Home carvedilol changed to Lopressor for now f. Cardiology consulted: Given another bolus of Cardizem and started Cardizem drip, increase Lopressor to 50 mg twice daily.  If she has not converted to NSR then would can consider DCCV.  Make n.p.o. after midnight  2. Hypertension a. Per cardiology: Stop amlodipine, continue lisinopril continue Cardizem  3. Hyperlipidemia a. Continue home statin  4. Upcoming dental procedure a. Currently on amoxicillin for prophylaxis, continuing for now  5. Diabetes with hyperglycemia a. Holding home meds b. Starting insulin while inpatient  6. Hypomagnesemia a. repleted in the ED, will repeat lab   DVT prophylaxis: Pradaxa   Code Status: Full Code  Diet: carb modified, NPO after midnight Family Communication: Admission, patients condition and plan of care including tests being ordered have been discussed with the patient who indicates understanding and agrees with the plan and Code Status. Patient's daughter was updated  Disposition Plan: The appropriate patient status for this patient is INPATIENT. Inpatient status is judged to be reasonable and necessary in order to provide the required intensity of service to ensure the patient's safety. The patient's presenting symptoms, physical exam findings, and initial radiographic and laboratory data in the context of their chronic comorbidities is felt to place them at high risk for further clinical deterioration. Furthermore, it is not anticipated that the patient will be medically stable for discharge from the hospital  within 2 midnights of admission. The following factors support the patient status of inpatient.   " The patient's presenting symptoms include  tachycardia. " The worrisome physical exam findings include irregular heart rhythm. " The initial radiographic and laboratory data are worrisome because of atrial flutter with RVR. " The chronic co-morbidities include atrial fibrillation, diabetes, hypertension.   * I certify that at the point of admission it is my clinical judgment that the patient will require inpatient hospital care spanning beyond 2 midnights from the point of admission due to high intensity of service, high risk for further deterioration and high frequency of surveillance required.*   Status is: Inpatient  Remains inpatient appropriate because:IV treatments appropriate due to intensity of illness or inability to take PO and Inpatient level of care appropriate due to severity of illness   Dispo: The patient is from: Home              Anticipated d/c is to: Home              Anticipated d/c date is: 2 days              Patient currently is not medically stable to d/c.      Consultants  . Cardiology  Procedures  . none  Time Spent on Admission: 65 minutes    Jae Dire, DO [facility]  02/13/2020, 12:50 PM

## 2020-02-13 NOTE — ED Notes (Addendum)
PT CBG 185

## 2020-02-13 NOTE — ED Provider Notes (Signed)
Patient has been in the [facility], ER for 21 hours. I reviewed notes she has been seen by Dr. Mayford Knife this morning.    10:17 AM Discussed with Dr. Carolanne Grumbling of cardiology who evaluated patient this morning she recommends admission to medicine with cardiology on consult has made additional recommendations in place orders for additional rate control medications.  Plan is to hopefully chemically cardiovert patient if this is successful we will be able to be discharged home with new medication regimen if unsuccessful may be a candidate for DC cardioversion electively tomorrow.  10:21 AM Discussed with Dr. Henrene Hawking of internal medicine who will admit.   [place] [address], [place] 02/13/20 1021    Sabas Sous, MD 02/13/20 [phone]

## 2020-02-13 NOTE — Consult Note (Signed)
Cardiology Consultation:   Patient ID: [name] MRN: [id number]; DOB: [birth year]  Admit date: 02/12/2020 Date of Consult: 02/13/2020  Primary Care Provider: [name], MD [facility] Cardiologist: Olga Millers, MD  [facility] Electrophysiologist:  None    Patient Profile:   [name] is a 86 y.o. female with a hx of PAF on Pradaxa, HTN, HLD, DM2, DVT, PAD s/p L above the knee bypass 2012 and CVA in 1999 who is being seen today for the evaluation of tachycardia at the request of Arby Barrette, MD.  History of Present Illness:   [name]  is a 86 y.o. female with a hx of PAF on Pradaxa, HTN, HLD, DM2, DVT, PAD s/p L above the knee bypass 2012 and CVA in 1999.  She apparently took her BP yesterday and noticed that her HR was 138bpm.  She call her physician and was told to go to the ER.  She was completely asymptomatic and denied any chest pain or pressure, SOB, DOE, LE edema, PND, orthopnea, dizziness or syncope.  She denied any palpitations and says that she feels fine.  In ER noted to have SVT at 116bpm.   She was given IV Lopressor in the ER but HR persisted in the 130's and was given IV Cardizem with no change in HR. Cardiology now asked to consult.   Past Medical History:  Diagnosis Date  . Atrial fibrillation (HCC)   . Diabetes mellitus   . DVT (deep venous thrombosis) (HCC)   . Hyperlipidemia   . Hypertension   . Leg pain   . Peripheral arterial disease (HCC)   . Stroke [facility]) 1999    Past Surgical History:  Procedure Laterality Date  . BACK SURGERY  1996  . CATARACT EXTRACTION  2000  . CESAREAN SECTION    . EYE SURGERY    . FEMORAL BYPASS Left July 23, 2010  . LUMBAR DISC SURGERY    . SPINE SURGERY       Home Medications:  Prior to Admission medications   Medication Sig Start Date End Date Taking? Authorizing Provider  amLODipine (NORVASC) 10 MG tablet TAKE 1 TABLET EVERY DAY Patient taking differently: Take 10 mg by mouth daily.   11/04/11  Yes Lewayne Bunting, MD  amoxicillin (AMOXIL) 500 MG capsule Take 500 mg by mouth 3 (three) times daily. Start date : 02/09/20 02/09/20  Yes [provider]  atorvastatin (LIPITOR) 40 MG tablet TAKE 1 TABLET BY MOUTH ONCE A DAY Patient taking differently: Take 40 mg by mouth daily.  06/17/11  Yes Lewayne Bunting, MD  carvedilol (COREG) 12.5 MG tablet TAKE 1 TABLET (12.5 MG TOTAL) BY MOUTH 2 (TWO) TIMES DAILY WITH A MEAL (NEW DOSE) Patient taking differently: Take 12.5 mg by mouth 2 (two) times daily with a meal.  11/12/19  Yes Azalee Course, PA  cholecalciferol (VITAMIN D3) 25 MCG (1000 UNIT) tablet Take 1,000 Units by mouth daily.   Yes [provider]  dabigatran (PRADAXA) 150 MG CAPS capsule Take 1 capsule (150 mg total) by mouth 2 (two) times daily. 04/23/19  Yes Lewayne Bunting, MD  fish oil-omega-3 fatty acids 1000 MG capsule Take 1 g by mouth daily.     Yes [provider]  glipiZIDE (GLUCOTROL) 10 MG tablet Take 10 mg by mouth 2 (two) times daily before a meal.     Yes [provider]  latanoprost (XALATAN) 0.005 % ophthalmic solution Place 1 drop into both eyes at  bedtime.   Yes [provider]  linagliptin (TRADJENTA) 5 MG TABS tablet Take 1 tablet (5 mg total) by mouth daily. 05/20/17  Yes Lewayne Bunting, MD  lisinopril (PRINIVIL,ZESTRIL) 10 MG tablet TAKE 1 TABLET DAILY Patient taking differently: Take 10 mg by mouth daily.  12/03/11  Yes Lewayne Bunting, MD  metFORMIN (GLUCOPHAGE) 500 MG tablet Take 1,000 mg by mouth 2 (two) times daily with a meal.    Yes [provider]  ACCU-CHEK SOFTCLIX LANCETS lancets  01/16/18   [provider]  TRUETEST TEST test strip  02/22/13   [provider]  iron polysaccharides (NIFEREX) 150 MG capsule Take 150 mg by mouth daily.    07/04/11  [provider]  topiramate (TOPAMAX) 15 MG capsule Take 15 mg by mouth 2 (two) times daily.    07/04/11  [provider]    Inpatient Medications: Scheduled Meds: . amoxicillin  500 mg Oral TID  . atorvastatin  40 mg Oral Daily  . dabigatran  150 mg Oral BID  . diltiazem  10 mg Intravenous Once  . glipiZIDE  10 mg Oral BID AC  . latanoprost  1 drop Both Eyes QHS  . linagliptin  5 mg Oral Daily  . metFORMIN  1,000 mg Oral BID WC  . metoprolol tartrate  50 mg Oral BID  . omega-3 acid ethyl esters  1 g Oral Daily   Continuous Infusions: . diltiazem (CARDIZEM) infusion     PRN Meds:   Allergies:   No Known Allergies  Social History:   Social History   Socioeconomic History  . Marital status: Widowed    Spouse name: Not on file  . Number of children: 1  . Years of education: Not on file  . Highest education level: Not on file  Occupational History  . Occupation: Retired    Comment: Teacher, adult education  Tobacco Use  . Smoking status: Never Smoker  . Smokeless tobacco: Never Used  Vaping Use  . Vaping Use: Never used  Substance and Sexual Activity  . Alcohol use: No  . Drug use: No  . Sexual activity: Not on file  Other Topics Concern  . Not on file  Social History Narrative   Lives alone. Has daily contact with friends. Transportation by friends and sometimes [employer].   Social Determinants of Health   Financial Resource Strain: Low Risk   . Difficulty of Paying Living Expenses: Not hard at all  Food Insecurity: No Food Insecurity  . Worried About Programme researcher, broadcasting/film/video in the Last Year: Never true  . Ran Out of Food in the Last Year: Never true  Transportation Needs: No Transportation Needs  . Lack of Transportation (Medical): No  . Lack of Transportation (Non-Medical): No  Physical Activity: Sufficiently Active  . Days of Exercise per Week: 7 days  . Minutes of Exercise per Session: 120 min  Stress: No Stress Concern Present  . Feeling of Stress : Not at all  Social Connections:   . Frequency of Communication with Friends and Family: Not on file  .  Frequency of Social Gatherings with Friends and Family: Not on file  . Attends Religious Services: Not on file  . Active Member of Clubs or Organizations: Not on file  . Attends Banker Meetings: Not on file  . Marital Status: Not on file  Intimate Partner Violence:   . Fear of Current or Ex-Partner: Not on file  .  Emotionally Abused: Not on file  . Physically Abused: Not on file  . Sexually Abused: Not on file    Family History:    Family History  Problem Relation Age of Onset  . Hypertension Mother   . Heart attack Mother   . Diabetes Father   . Hypertension Father   . Stroke Father   . Stroke Sister   . Diabetes Sister   . Heart disease Sister        Before age 38-Aneurysm  . Dementia Sister   . Alzheimer's disease Sister   . Diabetes Brother   . Diabetes Brother   . Heart disease Brother        Before age 51  . Heart attack Brother   . Heart attack Sister   . Diabetes Sister   . Alzheimer's disease Sister      ROS:  Please see the history of present illness.   All other ROS reviewed and negative.     Physical Exam/Data:   Vitals:   02/13/20 0445 02/13/20 0500 02/13/20 0715 02/13/20 0900  BP: 125/77 113/86 (!) 154/88 (!) 153/106  Pulse: (!) 108 (!) 57 (!) 143 (!) 156  Resp: 17 (!) 22 20 20   Temp:      TempSrc:      SpO2: 95% 98% 98% 100%  Weight:      Height:        Intake/Output Summary (Last 24 hours) at 02/13/2020 1003 Last data filed at 02/12/2020 1658 Gross per 24 hour  Intake 96.86 ml  Output --  Net 96.86 ml   Last 3 Weights 02/12/2020 06/26/2019 04/22/2019  Weight (lbs) 106 lb 97 lb 95 lb  Weight (kg) 48.081 kg 43.999 kg 43.092 kg     Body mass index is 21.41 kg/m.  General:  Well nourished, well developed, in no acute distress HEENT: normal Lymph: no adenopathy Neck: no JVD Endocrine:  No thryomegaly Vascular: No carotid bruits; FA pulses 2+ bilaterally without bruits  Cardiac:  normal S1, S2; RRR; no murmur  Lungs:   clear to auscultation bilaterally, no wheezing, rhonchi or rales  Abd: soft, nontender, no hepatomegaly  Ext: no edema Musculoskeletal:  No deformities, BUE and BLE strength normal and equal Skin: warm and dry  Neuro:  CNs 2-12 intact, no focal abnormalities noted Psych:  Normal affect   EKG:  The EKG was personally reviewed and demonstrates:  Atrial flutter with RVr and nonspecific ST abnormality Telemetry:  Telemetry was personally reviewed and demonstrates:  Atrial flutter with RVR and variable block at 147bpm  Relevant CV Studies: none  Laboratory Data:  High Sensitivity Troponin:  No results for input(s): TROPONINIHS in the last 720 hours.   Chemistry Recent Labs  Lab 02/12/20 1401  NA 139  K 3.9  CL 105  CO2 23  GLUCOSE 262*  BUN 19  CREATININE 0.82  CALCIUM 9.5  GFRNONAA >60  ANIONGAP 11    Recent Labs  Lab 02/12/20 1401  PROT 7.8  ALBUMIN 4.0  AST 32  ALT 33  ALKPHOS 66  BILITOT 0.6   Hematology Recent Labs  Lab 02/12/20 1401  WBC 4.7  RBC 3.09*  HGB 9.8*  HCT 29.6*  MCV 95.8  MCH 31.7  MCHC 33.1  RDW 15.9*  PLT 195   BNPNo results for input(s): BNP, PROBNP in the last 168 hours.  DDimer No results for input(s): DDIMER in the last 168 hours.   Radiology/Studies:  No results found.   Assessment  and Plan:   1. Atrial flutter with RVR -she is completely asymptomatic and denies any palpitations.  Only noticed when she took her Bp and HR read high -given IV Lopressor and IV cardizem but HR remains elevated -she is on chronic anticoagulation with Pradaxa 150mg  BID and has not missed any doses in the past 4 week -TSH normal -will given another bolus of IV Cardizem and start gtt at 5mg /hr and titrate for HR control -continue Lopressor but change from 25mg  q6hours to 50mg  BID -if she does not convert to NSR then consider DCCV -will make NPO after MN  2.  HTN -Bp elevated -starting IV Cardizem which will help with BP -changed carvedilol to  lopressor to help with HR control -stop amlodipine -continue Lisinopril 10mg  daily    CHA2DS2-VASc Score = 8  This indicates a 10.8% annual risk of stroke. The patient's score is based upon: CHF History: 0 HTN History: 1 Diabetes History: 1 Stroke History: 2 Vascular Disease History: 1 Age Score: 2 Gender Score: 1         For questions or updates, please contact [facility] Please consult [email or web address] for contact info under    Signed, , MD  02/13/2020 10:03 AM

## 2020-02-13 NOTE — Progress Notes (Signed)
PT Cancellation Note  Patient Details Name: [name] MRN: [id number] DOB: [birth year]   Cancelled Treatment:    Reason Eval/Treat Not Completed: Medical issues which prohibited therapy RN reports pt on IV cardizem and HR remains uncontrolled.  Will check back as schedule permits.   Taquan Bralley,KATHrine E 02/13/2020, 2:23 PM Kati PT, DPT Acute Rehabilitation Services Pager: [phone] Office: [phone]

## 2020-02-13 NOTE — ED Notes (Signed)
Report called to 1410 RN Pam

## 2020-02-14 ENCOUNTER — Other Ambulatory Visit: Payer: Self-pay

## 2020-02-14 ENCOUNTER — Inpatient Hospital Stay (HOSPITAL_COMMUNITY): Payer: Medicare HMO

## 2020-02-14 DIAGNOSIS — I4892 Unspecified atrial flutter: Secondary | ICD-10-CM

## 2020-02-14 DIAGNOSIS — Z792 Long term (current) use of antibiotics: Secondary | ICD-10-CM

## 2020-02-14 LAB — ECHOCARDIOGRAM COMPLETE
Height: 59 in
S' Lateral: 3 cm
Single Plane A4C EF: 40.1 %
Weight: 1696 oz

## 2020-02-14 LAB — BASIC METABOLIC PANEL
Anion gap: 12 (ref 5–15)
BUN: 18 mg/dL (ref 8–23)
CO2: 24 mmol/L (ref 22–32)
Calcium: 9.7 mg/dL (ref 8.9–10.3)
Chloride: 102 mmol/L (ref 98–111)
Creatinine, Ser: 0.85 mg/dL (ref 0.44–1.00)
GFR, Estimated: >60 mL/min (ref >60)
Glucose, Bld: 207 mg/dL — ABNORMAL HIGH (ref 70–99)
Potassium: 3.8 mmol/L (ref 3.5–5.1)
Sodium: 138 mmol/L (ref 135–145)

## 2020-02-14 LAB — CBC
HCT: 31.9 % — ABNORMAL LOW (ref 36.0–46.0)
Hemoglobin: 10.7 g/dL — ABNORMAL LOW (ref 12.0–15.0)
MCH: 31.6 pg (ref 26.0–34.0)
MCHC: 33.5 g/dL (ref 30.0–36.0)
MCV: 94.1 fL (ref 80.0–100.0)
Platelets: 215 10*3/uL (ref 150–400)
RBC: 3.39 MIL/uL — ABNORMAL LOW (ref 3.87–5.11)
RDW: 15.8 % — ABNORMAL HIGH (ref 11.5–15.5)
WBC: 7.2 10*3/uL (ref 4.0–10.5)
nRBC: 0 % (ref 0.0–0.2)

## 2020-02-14 LAB — GLUCOSE, CAPILLARY
Glucose-Capillary: 215 mg/dL — ABNORMAL HIGH (ref 70–99)
Glucose-Capillary: 133 mg/dL — ABNORMAL HIGH (ref 70–99)
Glucose-Capillary: 244 mg/dL — ABNORMAL HIGH (ref 70–99)

## 2020-02-14 MED ORDER — DILTIAZEM HCL 60 MG PO TABS
60.0000 mg | ORAL_TABLET | Freq: Four times a day (QID) | ORAL | Status: DC
  Administered 2020-02-14 – 2020-02-16 (×9): 60 mg via ORAL
  Filled 2020-02-14 (×9): qty 1

## 2020-02-14 NOTE — Assessment & Plan Note (Signed)
-  Slowly down trended but stable.  If unable to continue Cardizem, may need to consider alternative options such as amiodarone

## 2020-02-14 NOTE — Evaluation (Signed)
Physical Therapy Evaluation Patient Details Name: [name] MRN: [id number] DOB: [birth year] Today's Date: 02/14/2020   History of Present Illness  86 y.o. female with a history of paroxysmal atrial fibrillation on Pradaxa, PAD s/p left above knee bypass in 2012, CVA in 1999, hypertension, hyperlipidemia, type 2 diabetes mellitus, and prior DVT who came to hospital due to asymptomatic tachycardia, with cardiology consult revealing atrial flutter with RVR.  Clinical Impression  On eval, pt was Min guard assist for mobility. She walked ~50 feet during session. During walk, HR was up to 133 bpm. Pt was unsteady during session. Pt stated she has a straight cane available for use at home but she would like to have a quad cane. Pt lives alone so will recommend HHPT for a home safety evaluation to ensure safe transition back into home environment. Will plan to follow and progress activity as tolerated.     Follow Up Recommendations Home health PT;Supervision - Intermittent    Equipment Recommendations   (the patient is requesting a quad cane)    Recommendations for Other Services       Precautions / Restrictions Precautions Precautions: Fall Precaution Comments: monitor HR Restrictions Weight Bearing Restrictions: No      Mobility  Bed Mobility Overal bed mobility: Modified Independent                  Transfers Overall transfer level: Needs assistance Equipment used: None Transfers: Sit to/from Stand Sit to Stand: Min guard         General transfer comment: Posterior bias with pt bracing backs of knees against bed to stabilize.  Ambulation/Gait Ambulation/Gait assistance: Min guard Gait Distance (Feet): 50 Feet Assistive device: None Gait Pattern/deviations: Step-through pattern;Decreased stance time - left     General Gait Details: Unsteady. Very close guard assist provided. HR 133 bpm.  Stairs            Wheelchair Mobility    Modified Rankin  (Stroke Patients Only)       Balance Overall balance assessment: Needs assistance         Standing balance support: No upper extremity supported Standing balance-Leahy Scale: Fair                               Pertinent Vitals/Pain Pain Assessment: No/denies pain    Home Living Family/patient expects to be discharged to:: Private residence Living Arrangements: Alone Available Help at Discharge: Friend(s);Available PRN/intermittently Type of Home: House Home Access: Stairs to enter Entrance Stairs-Rails: Left Entrance Stairs-Number of Steps: 4 Home Layout: One level Home Equipment: Walker - 2 wheels;Cane - single point;Grab bars - tub/shower;Bedside commode;Shower seat (all borrowed/stored in attic)      Prior Function Level of Independence: Independent         Comments: Pt reports that her DME is left over from a family member, and she has stored in attic and has not used. Pt reports independent, unlimited ambulation, performs all IADLs except driving for which pt has assistance from her very close friend, [name] who is like a daughter to the pt, per pt report, as well as chuch friends.     Hand Dominance   Dominant Hand: Right    Extremity/Trunk Assessment   Upper Extremity Assessment Upper Extremity Assessment: Defer to OT evaluation    Lower Extremity Assessment Lower Extremity Assessment: Generalized weakness    Cervical / Trunk Assessment Cervical / Trunk Assessment: Normal  Communication   Communication: No difficulties  Cognition Arousal/Alertness: Awake/alert Behavior During Therapy: WFL for tasks assessed/performed Overall Cognitive Status: Within Functional Limits for tasks assessed                                        General Comments      Exercises     Assessment/Plan    PT Assessment Patient needs continued PT services  PT Problem List Decreased strength;Decreased mobility;Decreased balance;Decreased  activity tolerance;Decreased knowledge of use of DME       PT Treatment Interventions DME instruction;Gait training;Therapeutic activities;Therapeutic exercise;Patient/family education;Balance training;Functional mobility training    PT Goals (Current goals can be found in the Care Plan section)  Acute Rehab PT Goals Patient Stated Goal: home tomorrow PT Goal Formulation: With patient Time For Goal Achievement: 02/28/20 Potential to Achieve Goals: Good    Frequency Min 3X/week   Barriers to discharge        Co-evaluation               AM-PAC PT "6 Clicks" Mobility  Outcome Measure Help needed turning from your back to your side while in a flat bed without using bedrails?: None Help needed moving from lying on your back to sitting on the side of a flat bed without using bedrails?: None Help needed moving to and from a bed to a chair (including a wheelchair)?: A Little Help needed standing up from a chair using your arms (e.g., wheelchair or bedside chair)?: A Little Help needed to walk in hospital room?: A Little Help needed climbing 3-5 steps with a railing? : A Little 6 Click Score: 20    End of Session   Activity Tolerance: Patient tolerated treatment well Patient left: with call bell/phone within reach;with bed alarm set;with family/visitor present   PT Visit Diagnosis: Unsteadiness on feet (R26.81);Muscle weakness (generalized) (M62.81)    Time: 1287-8676 PT Time Calculation (min) (ACUTE ONLY): 15 min   Charges:   PT Evaluation $PT Eval Low Complexity: 1 Low             Faye Ramsay, PT Acute Rehabilitation  Office: [phone] Pager: [phone]

## 2020-02-14 NOTE — Assessment & Plan Note (Signed)
-   Continue SSI and CBG monitoring ?

## 2020-02-14 NOTE — Patient Outreach (Signed)
[facility]) Care Management  02/14/2020  [name] 03/29/1933 [id number]   Patiebt called the nurse line over the weekend due to rapid heart rate. Patient advised to seek medical treatment.  Patient did go to the ER and was subsequently admitted.    Plan: RN CM will follow hospitalization and outreach as appropriate.    [name], RN, MSN [facility] Care Management Care Management Coordinator Direct Line [phone] Cell [phone] Toll Free: [phone]  Fax: [phone]

## 2020-02-14 NOTE — Assessment & Plan Note (Signed)
-  Patient asymptomatic and rhythm unaware -Chronically anticoagulated on Pradaxa with good compliance and no missed doses -Cardizem drip being transitioned to oral regimen today -Continue Lopressor -TSH normal -Follow-up echo -If remains in RVR, plan is for cardioversion with cardiology on 02/15/2020

## 2020-02-14 NOTE — Assessment & Plan Note (Addendum)
-  Replete and recheck as needed 

## 2020-02-14 NOTE — Hospital Course (Signed)
[name] is an 86 yo female with PMH afib on chronic Pradaxa, HTN, HLD, PAD, DMII who presented after her BP machine at home noted an elevated HR. She was asymptomatic and admitted for treatment.  Her rate was up to the 150s on admission and she was ultimately started on a Cardizem drip.  She continued to have an elevated heart rate with downtrending blood pressure.  Cardiology was consulted and tentative plans are for cardioversion on 02/15/2020.  She was transitioned to oral Cardizem per cardiology on 02/14/2020.

## 2020-02-14 NOTE — Assessment & Plan Note (Signed)
-  Continue Lipitor °

## 2020-02-14 NOTE — Progress Notes (Signed)
  Echocardiogram 2D Echocardiogram has been performed.  Stark Bray Swaim 02/14/2020, 10:11 AM

## 2020-02-14 NOTE — Assessment & Plan Note (Signed)
-  Patient has upcoming dental procedure and has been on amoxicillin for prophylaxis -This is continued while inpatient

## 2020-02-14 NOTE — Evaluation (Signed)
Occupational Therapy Evaluation Patient Details Name: [name] MRN: [id number] DOB: 02-16-1933 Today's Date: 02/14/2020    History of Present Illness 86 y.o. female with a history of paroxysmal atrial fibrillation on Pradaxa, PAD s/p left above knee bypass in 2012, CVA in 1999, hypertension, hyperlipidemia, type 2 diabetes mellitus, and prior DVT who came to hospital due to asymptomatic tachycardia, with cardiology consult revealing atrial flutter with RVR.   Clinical Impression   Patient evaluated by Occupational Therapy with no further acute OT needs identified. All education has been completed and the patient has no further questions.  See below for any follow-up Occupational Therapy or equipment needs. OT is signing off. Will defer to Physical therapy to assess gait for distance and activity toerlance.  Thank you for this referral.     Follow Up Recommendations  No OT follow up    Equipment Recommendations   (Pt reports that she would like to have a Hurry-Cane)    Recommendations for Other Services PT consult     Precautions / Restrictions Precautions Precautions: Fall Precaution Comments: Mon HR Restrictions Weight Bearing Restrictions: No      Mobility Bed Mobility Overal bed mobility: Modified Independent                  Transfers Overall transfer level: Modified independent Equipment used: Rolling walker (2 wheeled)             General transfer comment: Pt stood from EOB Mod I.    Balance Overall balance assessment: Modified Independent                                         ADL either performed or assessed with clinical judgement   ADL Overall ADL's : Modified independent                                       General ADL Comments: Pt able to demonstrate LE dressing, toileting, and toilet transfer to standard toilet, ambulation in room with RW, and standing at sink for grooming/hygiene all Modified  independent except for line mangement.     Vision Baseline Vision/History: No visual deficits Patient Visual Report: No change from baseline Vision Assessment?: No apparent visual deficits     Perception     Praxis      Pertinent Vitals/Pain Pain Assessment: No/denies pain     Hand Dominance Right   Extremity/Trunk Assessment Upper Extremity Assessment Upper Extremity Assessment: Overall WFL for tasks assessed   Lower Extremity Assessment Lower Extremity Assessment: Overall WFL for tasks assessed   Cervical / Trunk Assessment Cervical / Trunk Assessment: Normal   Communication Communication Communication: No difficulties   Cognition Arousal/Alertness: Awake/alert Behavior During Therapy: WFL for tasks assessed/performed Overall Cognitive Status: Within Functional Limits for tasks assessed                                     General Comments       Exercises     Shoulder Instructions      Home Living Family/patient expects to be discharged to:: Private residence Living Arrangements: Alone Available Help at Discharge: Friend(s);Available PRN/intermittently Type of Home: House Home Access: Stairs to enter [employer] of Steps: 4  Entrance Stairs-Rails: Left (Side door (pt uses for groceries) with LT rail. Front door, which pt also uses with Bil rails but wide apart.) Home Layout: One level     Bathroom Shower/Tub: Tub/shower unit;Door   Bathroom Toilet: Handicapped height     Home Equipment: Environmental consultant - 2 wheels;Cane - single point;Grab bars - tub/shower;Bedside commode;Shower seat          Prior Functioning/Environment Level of Independence: Independent        Comments: Pt reports that her DME is left over from a family member, and she has stored in attic and has not used. Pt reports independent, unlimited ambulation, performs all IADLs except driving for which pt has assistance from her very close friend, [name] who is like a  daughter to the pt, per pt report, as well as chuch friends.        OT Problem List:        OT Treatment/Interventions:      OT Goals(Current goals can be found in the care plan section) Acute Rehab OT Goals Patient Stated Goal: To go home and fix wobbly rails to front door herself. OT Goal Formulation: With patient  OT Frequency:     Barriers to D/C:            Co-evaluation              AM-PAC OT "6 Clicks" Daily Activity     Outcome Measure Help from another person eating meals?: None Help from another person taking care of personal grooming?: None Help from another person toileting, which includes using toliet, bedpan, or urinal?: None Help from another person bathing (including washing, rinsing, drying)?: A Little Help from another person to put on and taking off regular upper body clothing?: None Help from another person to put on and taking off regular lower body clothing?: None 6 Click Score: 23   End of Session Equipment Utilized During Treatment: Gait belt;Rolling walker Nurse Communication: Mobility status  Activity Tolerance: Patient tolerated treatment well Patient left: in bed;with call bell/phone within reach;with bed alarm set  OT Visit Diagnosis: History of falling (Z91.81) (tachycardia. 1 fall in past year.)                Time: 9407-6808 OT Time Calculation (min): 27 min Charges:  OT General Charges $OT Visit: 1 Visit OT Evaluation $OT Eval Low Complexity: 1 Low  Emanie Behan, OT Acute Rehab Services Office: [phone] 02/14/2020   Theodoro Clock 02/14/2020, 10:52 AM

## 2020-02-14 NOTE — Progress Notes (Signed)
Progress Note  Patient Name: [name] Date of Encounter: 02/14/2020  [facility] Cardiologist: Olga Millers, MD   Subjective   Asymptomatic in flutter Had breakfast   Inpatient Medications    Scheduled Meds: . amoxicillin  500 mg Oral TID  . atorvastatin  40 mg Oral Daily  . dabigatran  150 mg Oral BID  . insulin aspart  0-9 Units Subcutaneous TID WC  . insulin glargine  5 Units Subcutaneous Daily  . latanoprost  1 drop Both Eyes QHS  . lisinopril  10 mg Oral Daily  . metoprolol tartrate  50 mg Oral BID  . omega-3 acid ethyl esters  1 g Oral Daily   Continuous Infusions: . diltiazem (CARDIZEM) infusion 10 mg/hr (02/14/20 0506)   PRN Meds: acetaminophen, ondansetron (ZOFRAN) IV   Vital Signs    Vitals:   02/14/20 0200 02/14/20 0300 02/14/20 0400 02/14/20 0500  BP: 93/69 103/64 103/66 119/87  Pulse: (!) 37 90 94 (!) 131  Resp: 18 17 (!) 31 20  Temp: 97.6 F (36.4 C)     TempSrc: Oral     SpO2: 99% 97% 97% 96%  Weight:      Height:       No intake or output data in the 24 hours ending 02/14/20 0732 Last 3 Weights 02/12/2020 06/26/2019 04/22/2019  Weight (lbs) 106 lb 97 lb 95 lb  Weight (kg) 48.081 kg 43.999 kg 43.092 kg      Telemetry    Flutter rates 100  - Personally Reviewed  ECG    Flutter no acute ST changes   Physical Exam  Thin black female  GEN: No acute distress.   Neck: No JVD Cardiac: RRR, no murmurs, rubs, or gallops.  Respiratory: Clear to auscultation bilaterally. GI: Soft, nontender, non-distended  MS: No edema; No deformity. Neuro:  Nonfocal  Psych: Normal affect   Labs    High Sensitivity Troponin:  No results for input(s): TROPONINIHS in the last 720 hours.    Chemistry Recent Labs  Lab 02/12/20 1401 02/13/20 1251 02/14/20 0434  NA 139 140 138  K 3.9 3.8 3.8  CL 105 105 102  CO2 23 23 24   GLUCOSE 262* 198* 207*  BUN 19 14 18   CREATININE 0.82 0.80 0.85  CALCIUM 9.5 9.6 9.7  PROT 7.8  --   --   ALBUMIN  4.0  --   --   AST 32  --   --   ALT 33  --   --   ALKPHOS 66  --   --   BILITOT 0.6  --   --   GFRNONAA >60 >60 >60  ANIONGAP 11 12 12      Hematology Recent Labs  Lab 02/12/20 1401 02/13/20 1251 02/14/20 0434  WBC 4.7 7.2 7.2  RBC 3.09* 3.52* 3.39*  HGB 9.8* 11.0* 10.7*  HCT 29.6* 33.7* 31.9*  MCV 95.8 95.7 94.1  MCH 31.7 31.3 31.6  MCHC 33.1 32.6 33.5  RDW 15.9* 16.1* 15.8*  PLT 195 206 215    BNP Recent Labs  Lab 02/13/20 1251  BNP 222.4*     DDimer No results for input(s): DDIMER in the last 168 hours.   Radiology    No results found.  Cardiac Studies   Echo being done now   Patient Profile     86 y.o. female with a history of paroxysmal atrial fibrillation on Pradaxa, PAD s/p left above knee bypass in 2012, CVA in 1999, hypertension, hyperlipidemia,  type 2 diabetes mellitus, and prior DVT who is being seen today for evaluation of atrial flutter with RVR at the request of Dr. Donnald Garre.  Assessment & Plan    Atrial Flutter with RVR History of Atrial Fibrillation  - Potassium 3.8 today. - Magnesium 1.5 on admission. Repleted and 1.7 yesterday. - TSH normal. - Echo pending. - Continue lopressor and change cardizem to PO - Continue chronic anticoagulation with Pradaxa 150mg  twice daily. She has not missed any doses in the last 4 weeks. - Tentative DCC for 2:00 tomorrow is earliest time available if she dose not convert   Hypertension - BP mostly well controlled but soft BP at times last night.  Hyperlipidemia - Lipid panel this admission: Total Cholesterol 120, Triglycerides 60, HDL 46, LDL 62. - Continue Lipitor 40mg  daily.  For questions or updates, please contact [facility] Please consult [email or web address] for contact info under        MD [facility]

## 2020-02-14 NOTE — Progress Notes (Signed)
PROGRESS NOTE    [name]   KDX:[id number]  DOB: 1932/12/08  DOA: 02/12/2020     1  PCP: [name], MD  CC: elevated HR  Hospital Course: [name] is an 86 yo female with PMH afib on chronic Pradaxa, HTN, HLD, PAD, DMII who presented after her BP machine at home noted an elevated HR. She was asymptomatic and admitted for treatment.  Her rate was up to the 150s on admission and she was ultimately started on a Cardizem drip.  She continued to have an elevated heart rate with downtrending blood pressure.  Cardiology was consulted and tentative plans are for cardioversion on 02/15/2020.  She was transitioned to oral Cardizem per cardiology on 02/14/2020.   Interval History:  Resting in bed this am. No CP or SOB. Can not tell her HR is elevated nor feels any palpitations. Understands plan is for possible cardioversion tomorrow.   Old records reviewed in assessment of this patient  ROS: Constitutional: negative for chills and fevers, Respiratory: negative for cough, Cardiovascular: negative for chest pain and Gastrointestinal: negative for abdominal pain  Assessment & Plan: * Atrial flutter with rapid ventricular response (HCC) -Patient asymptomatic and rhythm unaware -Chronically anticoagulated on Pradaxa with good compliance and no missed doses -Cardizem drip being transitioned to oral regimen today -Continue Lopressor -TSH normal -Follow-up echo -If remains in RVR, plan is for cardioversion with cardiology on 02/15/2020  Need for prophylactic antibiotic -Patient has upcoming dental procedure and has been on amoxicillin for prophylaxis -This is continued while inpatient  Hypomagnesemia - Replete and recheck as needed  Peripheral vascular disease (HCC) -Continue Lipitor  Essential hypertension -Slowly down trended but stable.  If unable to continue Cardizem, may need to consider alternative options such as amiodarone  Type 2 diabetes mellitus with hyperlipidemia  (HCC) -Continue SSI and CBG monitoring    Antimicrobials: Amoxicillin  DVT prophylaxis: Pradaxa Code Status: Full Family Communication: none present Disposition Plan: Status is: Inpatient  Remains inpatient appropriate because:Hemodynamically unstable, Ongoing diagnostic testing needed not appropriate for outpatient work up, IV treatments appropriate due to intensity of illness or inability to take PO and Inpatient level of care appropriate due to severity of illness   Dispo: The patient is from: Home              Anticipated d/c is to: Home              Anticipated d/c date is: 2 days              Patient currently is not medically stable to d/c.  Objective: Blood pressure 108/72, pulse (!) 102, temperature (!) 97.5 F (36.4 C), temperature source Oral, resp. rate 16, height 4\' 11"  (1.499 m), weight 48.1 kg, SpO2 100 %.  Examination: General appearance: alert, cooperative and no distress Head: Normocephalic, without obvious abnormality, atraumatic Eyes: EOMI Lungs: clear to auscultation bilaterally Heart: irregularly irregular rhythm, S1, S2 normal and tachycardic Abdomen: normal findings: bowel sounds normal and soft, non-tender Extremities: no edema Skin: mobility and turgor normal Neurologic: Grossly normal  Consultants:   Cardiology  Procedures:   n/a  Data Reviewed: I have personally reviewed following labs and imaging studies Results for orders placed or performed during the hospital encounter of 02/12/20 (from the past 24 hour(s))  Glucose, capillary     Status: Abnormal   Collection Time: 02/13/20  4:54 PM  Result Value Ref Range   Glucose-Capillary 237 (H) 70 - 99 mg/dL  Glucose, capillary  Status: Abnormal   Collection Time: 02/13/20  8:32 PM  Result Value Ref Range   Glucose-Capillary 205 (H) 70 - 99 mg/dL  Basic metabolic panel     Status: Abnormal   Collection Time: 02/14/20  4:34 AM  Result Value Ref Range   Sodium 138 135 - 145 mmol/L    Potassium 3.8 3.5 - 5.1 mmol/L   Chloride 102 98 - 111 mmol/L   CO2 24 22 - 32 mmol/L   Glucose, Bld 207 (H) 70 - 99 mg/dL   BUN 18 8 - 23 mg/dL   Creatinine, Ser 2.58 0.44 - 1.00 mg/dL   Calcium 9.7 8.9 - 52.7 mg/dL   GFR, Estimated >78 >24 mL/min   Anion gap 12 5 - 15  CBC     Status: Abnormal   Collection Time: 02/14/20  4:34 AM  Result Value Ref Range   WBC 7.2 4.0 - 10.5 K/uL   RBC 3.39 (L) 3.87 - 5.11 MIL/uL   Hemoglobin 10.7 (L) 12.0 - 15.0 g/dL   HCT 23.5 (L) 36 - 46 %   MCV 94.1 80.0 - 100.0 fL   MCH 31.6 26.0 - 34.0 pg   MCHC 33.5 30.0 - 36.0 g/dL   RDW 36.1 (H) 44.3 - 15.4 %   Platelets 215 150 - 400 K/uL   nRBC 0.0 0.0 - 0.2 %  Glucose, capillary     Status: Abnormal   Collection Time: 02/14/20  7:35 AM  Result Value Ref Range   Glucose-Capillary 215 (H) 70 - 99 mg/dL  Glucose, capillary     Status: Abnormal   Collection Time: 02/14/20 11:19 AM  Result Value Ref Range   Glucose-Capillary 133 (H) 70 - 99 mg/dL    Recent Results (from the past 240 hour(s))  Respiratory Panel by RT PCR (Flu A&B, Covid) - Nasopharyngeal Swab     Status: None   Collection Time: 02/12/20  7:20 PM   Specimen: Nasopharyngeal Swab  Result Value Ref Range Status   SARS Coronavirus 2 by RT PCR NEGATIVE NEGATIVE Final    Comment: (NOTE) SARS-CoV-2 target nucleic acids are NOT DETECTED.  The SARS-CoV-2 RNA is generally detectable in upper respiratoy specimens during the acute phase of infection. The lowest concentration of SARS-CoV-2 viral copies this assay can detect is 131 copies/mL. A negative result does not preclude SARS-Cov-2 infection and should not be used as the sole basis for treatment or other patient management decisions. A negative result may occur with  improper specimen collection/handling, submission of specimen other than nasopharyngeal swab, presence of viral mutation(s) within the areas targeted by this assay, and inadequate number of viral copies (<131 copies/mL). A  negative result must be combined with clinical observations, patient history, and epidemiological information. The expected result is Negative.  Fact Sheet for Patients:  https://www.moore.com/  Fact Sheet for Healthcare Providers:  https://www.young.biz/  This test is no t yet approved or cleared by the [place] FDA and  has been authorized for detection and/or diagnosis of SARS-CoV-2 by FDA under an Emergency Use Authorization (EUA). This EUA will remain  in effect (meaning this test can be used) for the duration of the COVID-19 declaration under Section 564(b)(1) of the Act, 21 U.S.C. section 360bbb-3(b)(1), unless the authorization is terminated or revoked sooner.     Influenza A by PCR NEGATIVE NEGATIVE Final   Influenza B by PCR NEGATIVE NEGATIVE Final    Comment: (NOTE) The Xpert Xpress SARS-CoV-2/FLU/RSV assay is intended as an aid in  the diagnosis of influenza from Nasopharyngeal swab specimens and  should not be used as a sole basis for treatment. Nasal washings and  aspirates are unacceptable for Xpert Xpress SARS-CoV-2/FLU/RSV  testing.  Fact Sheet for Patients: https://www.moore.com/  Fact Sheet for Healthcare Providers: https://www.young.biz/  This test is not yet approved or cleared by the [place] FDA and  has been authorized for detection and/or diagnosis of SARS-CoV-2 by  FDA under an Emergency Use Authorization (EUA). This EUA will remain  in effect (meaning this test can be used) for the duration of the  Covid-19 declaration under Section 564(b)(1) of the Act, 21  U.S.C. section 360bbb-3(b)(1), unless the authorization is  terminated or revoked. Performed at [facility], [address] [place], [place] [address]      Radiology Studies: No results found. No orders to display    Scheduled Meds: . amoxicillin  500 mg Oral TID  . atorvastatin   40 mg Oral Daily  . dabigatran  150 mg Oral BID  . diltiazem  60 mg Oral Q6H  . insulin aspart  0-9 Units Subcutaneous TID WC  . insulin glargine  5 Units Subcutaneous Daily  . latanoprost  1 drop Both Eyes QHS  . lisinopril  10 mg Oral Daily  . metoprolol tartrate  50 mg Oral BID  . omega-3 acid ethyl esters  1 g Oral Daily   PRN Meds: acetaminophen, ondansetron (ZOFRAN) IV Continuous Infusions:   LOS: 1 day  Time spent: Greater than 50% of the 35 minute visit was spent in counseling/coordination of care for the patient as laid out in the A&P.   Lewie Chamber, MD [facility] 02/14/2020, 1:12 PM

## 2020-02-15 ENCOUNTER — Encounter (HOSPITAL_COMMUNITY): Payer: Self-pay | Admitting: Internal Medicine

## 2020-02-15 ENCOUNTER — Inpatient Hospital Stay (HOSPITAL_COMMUNITY): Payer: Medicare HMO | Admitting: Anesthesiology

## 2020-02-15 ENCOUNTER — Encounter (HOSPITAL_COMMUNITY)
Admission: EM | Disposition: A | Discharge status: Home Health | Payer: Self-pay | Source: Home / Self Care | Attending: Internal Medicine

## 2020-02-15 DIAGNOSIS — E785 Hyperlipidemia, unspecified: Secondary | ICD-10-CM

## 2020-02-15 DIAGNOSIS — I739 Peripheral vascular disease, unspecified: Secondary | ICD-10-CM

## 2020-02-15 DIAGNOSIS — I10 Essential (primary) hypertension: Secondary | ICD-10-CM | POA: Diagnosis not present

## 2020-02-15 DIAGNOSIS — I4892 Unspecified atrial flutter: Secondary | ICD-10-CM | POA: Diagnosis not present

## 2020-02-15 HISTORY — PX: CARDIOVERSION: SHX1299

## 2020-02-15 LAB — CBC WITH DIFFERENTIAL/PLATELET
Abs Immature Granulocytes: 0.02 10*3/uL (ref 0.00–0.07)
Basophils Absolute: 0 10*3/uL (ref 0.0–0.1)
Basophils Relative: 0 %
Eosinophils Absolute: 0.1 10*3/uL (ref 0.0–0.5)
Eosinophils Relative: 1 %
HCT: 29.6 % — ABNORMAL LOW (ref 36.0–46.0)
Hemoglobin: 9.7 g/dL — ABNORMAL LOW (ref 12.0–15.0)
Immature Granulocytes: 0 %
Lymphocytes Relative: 25 %
Lymphs Abs: 1.4 10*3/uL (ref 0.7–4.0)
MCH: 31.2 pg (ref 26.0–34.0)
MCHC: 32.8 g/dL (ref 30.0–36.0)
MCV: 95.2 fL (ref 80.0–100.0)
Monocytes Absolute: 0.9 10*3/uL (ref 0.1–1.0)
Monocytes Relative: 16 %
Neutro Abs: 3.3 10*3/uL (ref 1.7–7.7)
Neutrophils Relative %: 58 %
Platelets: 184 10*3/uL (ref 150–400)
RBC: 3.11 MIL/uL — ABNORMAL LOW (ref 3.87–5.11)
RDW: 16.1 % — ABNORMAL HIGH (ref 11.5–15.5)
WBC: 5.7 10*3/uL (ref 4.0–10.5)
nRBC: 0 % (ref 0.0–0.2)

## 2020-02-15 LAB — MAGNESIUM: Magnesium: 1.6 mg/dL — ABNORMAL LOW (ref 1.7–2.4)

## 2020-02-15 LAB — GLUCOSE, CAPILLARY
Glucose-Capillary: 109 mg/dL — ABNORMAL HIGH (ref 70–99)
Glucose-Capillary: 120 mg/dL — ABNORMAL HIGH (ref 70–99)
Glucose-Capillary: 187 mg/dL — ABNORMAL HIGH (ref 70–99)
Glucose-Capillary: 206 mg/dL — ABNORMAL HIGH (ref 70–99)
Glucose-Capillary: 249 mg/dL — ABNORMAL HIGH (ref 70–99)
Glucose-Capillary: 372 mg/dL — ABNORMAL HIGH (ref 70–99)
Glucose-Capillary: 418 mg/dL — ABNORMAL HIGH (ref 70–99)

## 2020-02-15 LAB — BASIC METABOLIC PANEL
Anion gap: 11 (ref 5–15)
BUN: 32 mg/dL — ABNORMAL HIGH (ref 8–23)
CO2: 21 mmol/L — ABNORMAL LOW (ref 22–32)
Calcium: 9.1 mg/dL (ref 8.9–10.3)
Chloride: 102 mmol/L (ref 98–111)
Creatinine, Ser: 1.08 mg/dL — ABNORMAL HIGH (ref 0.44–1.00)
GFR, Estimated: 50 mL/min — ABNORMAL LOW (ref >60)
Glucose, Bld: 260 mg/dL — ABNORMAL HIGH (ref 70–99)
Potassium: 3.5 mmol/L (ref 3.5–5.1)
Sodium: 134 mmol/L — ABNORMAL LOW (ref 135–145)

## 2020-02-15 SURGERY — CARDIOVERSION
Anesthesia: General

## 2020-02-15 MED ORDER — POTASSIUM CHLORIDE CRYS ER 20 MEQ PO TBCR
40.0000 meq | EXTENDED_RELEASE_TABLET | Freq: Once | ORAL | Status: AC
  Administered 2020-02-15: 40 meq via ORAL
  Filled 2020-02-15: qty 2

## 2020-02-15 MED ORDER — PROPOFOL 10 MG/ML IV BOLUS
INTRAVENOUS | Status: DC | PRN
  Administered 2020-02-15: 50 mg via INTRAVENOUS

## 2020-02-15 MED ORDER — INSULIN ASPART 100 UNIT/ML ~~LOC~~ SOLN
0.0000 [IU] | SUBCUTANEOUS | Status: DC
  Administered 2020-02-15: 15 [IU] via SUBCUTANEOUS
  Administered 2020-02-16: 3 [IU] via SUBCUTANEOUS
  Administered 2020-02-16: 11 [IU] via SUBCUTANEOUS

## 2020-02-15 MED ORDER — SODIUM CHLORIDE 0.9 % IV SOLN: INTRAVENOUS | Status: DC | PRN

## 2020-02-15 MED ORDER — LIDOCAINE 2% (20 MG/ML) 5 ML SYRINGE
INTRAMUSCULAR | Status: DC | PRN
  Administered 2020-02-15: 80 mg via INTRAVENOUS

## 2020-02-15 MED ORDER — INSULIN GLARGINE 100 UNIT/ML ~~LOC~~ SOLN
8.0000 [IU] | Freq: Two times a day (BID) | SUBCUTANEOUS | Status: DC
  Administered 2020-02-15 – 2020-02-16 (×2): 8 [IU] via SUBCUTANEOUS
  Filled 2020-02-15 (×2): qty 0.08

## 2020-02-15 MED ORDER — PHENYLEPHRINE 40 MCG/ML (10ML) SYRINGE FOR IV PUSH (FOR BLOOD PRESSURE SUPPORT)
PREFILLED_SYRINGE | INTRAVENOUS | Status: DC | PRN
  Administered 2020-02-15 (×2): 40 ug via INTRAVENOUS

## 2020-02-15 MED ORDER — MAGNESIUM SULFATE 2 GM/50ML IV SOLN
2.0000 g | Freq: Once | INTRAVENOUS | Status: AC
  Administered 2020-02-15: 2 g via INTRAVENOUS
  Filled 2020-02-15: qty 50

## 2020-02-15 MED ORDER — AMOXICILLIN 250 MG PO CAPS
500.0000 mg | ORAL_CAPSULE | Freq: Two times a day (BID) | ORAL | Status: DC
  Administered 2020-02-15 – 2020-02-16 (×2): 500 mg via ORAL
  Filled 2020-02-15 (×2): qty 2

## 2020-02-15 NOTE — Progress Notes (Signed)
PROGRESS NOTE    [name]  OAC:[id number] DOB: 09-24-1932 DOA: 02/12/2020 PCP: [name], MD     Brief Narrative:  [name] is an 86 yo BF PMHx  Afib on chronic Pradaxa, HTN, HLD, PAD, DMII   who presented after her BP machine at home noted an elevated HR. She was asymptomatic and admitted for treatment.  Her rate was up to the 150s on admission and she was ultimately started on a Cardizem drip.  She continued to have an elevated heart rate with downtrending blood pressure.  Cardiology was consulted and tentative plans are for cardioversion on 02/15/2020.  She was transitioned to oral Cardizem per cardiology on 02/14/2020.   Subjective: Afebrile overnight, currently in a flutter   Assessment & Plan: Covid vaccination;   Principal Problem:   Atrial flutter with rapid ventricular response (HCC) Active Problems:   Type 2 diabetes mellitus with hyperlipidemia (HCC)   Essential hypertension   Peripheral vascular disease (HCC)   Hypomagnesemia   Need for prophylactic antibiotic   Atrial flutter with rapid ventricular response (HCC) -Patient asymptomatic and rhythm unaware -Chronically anticoagulated on Pradaxa with good compliance and no missed doses -Cardizem 60 mg QID -Lisinopril 10 mg daily -Metoprolol 50 mg BID -TSH normal -Follow-up echo -11/2 s/p DCCV   Essential hypertension -See a flutter   Need for prophylactic antibiotic -Patient has upcoming dental procedure prophylactic amoxicillin 500 mg BID -Continue as inpatient  Hypomagnesemia - Replete and recheck as needed  Peripheral vascular disease (HCC) -Lipitor 40 mg daily -Lipid panel pending   Type 2 diabetes mellitus controlled with hyper glycemia  -10/31 hemoglobin A1c= 8.3 --11/2 increase Lantus 8 units BID -11/2 increase moderate SSI  HLD -See PVD    DVT prophylaxis: Pradaxa Code Status: Full Family Communication: 11/2 daughter at bedside for discussion of plan of care  answered all questions Status is: Inpatient    Dispo: The patient is from: Home              Anticipated d/c is to: Home              Anticipated d/c date is: Per cardiology              Patient currently unstable      Consultants:  Cardiology   Procedures/Significant Events:  11/2 s/p DCCV  I have personally reviewed and interpreted all radiology studies and my findings are as above.  VENTILATOR SETTINGS:    Cultures 10/30: SARS coronavirus negative 10/30 influenza A/B negative   Antimicrobials: Anti-infectives (From admission, onward)   Start     Ordered Stop   02/15/20 2200  amoxicillin (AMOXIL) capsule 500 mg        02/15/20 1356     02/12/20 2200  amoxicillin (AMOXIL) capsule 500 mg  Status:  Discontinued        02/12/20 2019 02/15/20 1356       Devices    LINES / TUBES:      Continuous Infusions: . magnesium sulfate bolus IVPB       Objective: Vitals:   02/14/20 1828 02/14/20 2050 02/15/20 0019 02/15/20 0455  BP:  112/64 114/66 109/61  Pulse:  87 75 75  Resp:  20 16   Temp: 97.8 F (36.6 C)  97.8 F (36.6 C) 97.7 F (36.5 C)  TempSrc: Axillary  Oral Oral  SpO2:  96% 95% 99%  Weight:      Height:        Intake/Output Summary (  Last 24 hours) at 02/15/2020 0849 Last data filed at 02/15/2020 0455 Gross per 24 hour  Intake 732.7 ml  Output 1 ml  Net 731.7 ml   Filed Weights   02/12/20 1327  Weight: 48.1 kg    Examination:  General: A/O x4, No acute respiratory distress, cachectic Eyes: negative scleral hemorrhage, negative anisocoria, negative icterus ENT: Negative Runny nose, negative gingival bleeding,, Neck:  Negative scars, masses, torticollis, lymphadenopathy, JVD Lungs: Clear to auscultation bilaterally without wheezes or crackles Cardiovascular: Irregularly irregular rhythm and rate without murmur gallop or rub normal S1 and S2 Abdomen: negative abdominal pain, nondistended, positive soft, bowel sounds, no rebound, no  ascites, no appreciable mass Extremities: No significant cyanosis, clubbing, or edema bilateral lower extremities Skin: Negative rashes, lesions, ulcers Psychiatric:  Negative depression, negative anxiety, negative fatigue, negative mania  Central nervous system:  Cranial nerves II through XII intact, tongue/uvula midline, all extremities muscle strength 5/5, sensation intact throughout, negative dysarthria, negative expressive aphasia, negative receptive aphasia.  .     Data Reviewed: Care during the described time interval was provided by me .  I have reviewed this patient's available data, including medical history, events of note, physical examination, and all test results as part of my evaluation.  CBC: Recent Labs  Lab 02/12/20 1401 02/13/20 1251 02/14/20 0434 02/15/20 0448  WBC 4.7 7.2 7.2 5.7  NEUTROABS 3.1 5.0  --  3.3  HGB 9.8* 11.0* 10.7* 9.7*  HCT 29.6* 33.7* 31.9* 29.6*  MCV 95.8 95.7 94.1 95.2  PLT 195 206 215 184   Basic Metabolic Panel: Recent Labs  Lab 02/12/20 1401 02/13/20 1251 02/14/20 0434 02/15/20 0448  NA 139 140 138 134*  K 3.9 3.8 3.8 3.5  CL 105 105 102 102  CO2 23 23 24  21*  GLUCOSE 262* 198* 207* 260*  BUN 19 14 18  32*  CREATININE 0.82 0.80 0.85 1.08*  CALCIUM 9.5 9.6 9.7 9.1  MG 1.5* 1.7  --  1.6*   GFR: Estimated Creatinine Clearance: 26 mL/min (A) (by C-G formula based on SCr of 1.08 mg/dL (H)). Liver Function Tests: Recent Labs  Lab 02/12/20 1401  AST 32  ALT 33  ALKPHOS 66  BILITOT 0.6  PROT 7.8  ALBUMIN 4.0   No results for input(s): LIPASE, AMYLASE in the last 168 hours. No results for input(s): AMMONIA in the last 168 hours. Coagulation Profile: No results for input(s): INR, PROTIME in the last 168 hours. Cardiac Enzymes: No results for input(s): CKTOTAL, CKMB, CKMBINDEX, TROPONINI in the last 168 hours. BNP (last 3 results) No results for input(s): PROBNP in the last 8760 hours. HbA1C: Recent Labs    02/13/20 1251   HGBA1C 8.3*   CBG: Recent Labs  Lab 02/14/20 0735 02/14/20 1119 02/14/20 1601 02/15/20 0023 02/15/20 0802  GLUCAP 215* 133* 244* 206* 249*   Lipid Profile: Recent Labs    02/13/20 1251  CHOL 120  HDL 46  LDLCALC 62  TRIG 60  CHOLHDL 2.6   Thyroid Function Tests: Recent Labs    02/13/20 1251  TSH 2.718   Anemia Panel: No results for input(s): VITAMINB12, FOLATE, FERRITIN, TIBC, IRON, RETICCTPCT in the last 72 hours. Sepsis Labs: No results for input(s): PROCALCITON, LATICACIDVEN in the last 168 hours.  Recent Results (from the past 240 hour(s))  Respiratory Panel by RT PCR (Flu A&B, Covid) - Nasopharyngeal Swab     Status: None   Collection Time: 02/12/20  7:20 PM   Specimen: Nasopharyngeal Swab  Result Value Ref Range Status   SARS Coronavirus 2 by RT PCR NEGATIVE NEGATIVE Final    Comment: (NOTE) SARS-CoV-2 target nucleic acids are NOT DETECTED.  The SARS-CoV-2 RNA is generally detectable in upper respiratoy specimens during the acute phase of infection. The lowest concentration of SARS-CoV-2 viral copies this assay can detect is 131 copies/mL. A negative result does not preclude SARS-Cov-2 infection and should not be used as the sole basis for treatment or other patient management decisions. A negative result may occur with  improper specimen collection/handling, submission of specimen other than nasopharyngeal swab, presence of viral mutation(s) within the areas targeted by this assay, and inadequate number of viral copies (<131 copies/mL). A negative result must be combined with clinical observations, patient history, and epidemiological information. The expected result is Negative.  Fact Sheet for Patients:  https://www.moore.com/  Fact Sheet for Healthcare Providers:  https://www.young.biz/  This test is no t yet approved or cleared by the [place] FDA and  has been authorized for detection and/or  diagnosis of SARS-CoV-2 by FDA under an Emergency Use Authorization (EUA). This EUA will remain  in effect (meaning this test can be used) for the duration of the COVID-19 declaration under Section 564(b)(1) of the Act, 21 U.S.C. section 360bbb-3(b)(1), unless the authorization is terminated or revoked sooner.     Influenza A by PCR NEGATIVE NEGATIVE Final   Influenza B by PCR NEGATIVE NEGATIVE Final    Comment: (NOTE) The Xpert Xpress SARS-CoV-2/FLU/RSV assay is intended as an aid in  the diagnosis of influenza from Nasopharyngeal swab specimens and  should not be used as a sole basis for treatment. Nasal washings and  aspirates are unacceptable for Xpert Xpress SARS-CoV-2/FLU/RSV  testing.  Fact Sheet for Patients: https://www.moore.com/  Fact Sheet for Healthcare Providers: https://www.young.biz/  This test is not yet approved or cleared by the [place] FDA and  has been authorized for detection and/or diagnosis of SARS-CoV-2 by  FDA under an Emergency Use Authorization (EUA). This EUA will remain  in effect (meaning this test can be used) for the duration of the  Covid-19 declaration under Section 564(b)(1) of the Act, 21  U.S.C. section 360bbb-3(b)(1), unless the authorization is  terminated or revoked. Performed at [facility], [address] [place], [place] [address]          Radiology Studies: ECHOCARDIOGRAM COMPLETE  Result Date: 02/14/2020    ECHOCARDIOGRAM REPORT   Patient Name:   [name] [facility] Date of Exam: 02/14/2020 Medical Rec #:  [id number]        Height:       59.0 in Accession #:    [id number]       Weight:       106.0 lb Date of Birth:  1932/10/21       BSA:          1.408 m Patient Age:    87 years         BP:           119/87 mmHg Patient Gender: F                HR:           108 bpm. Exam Location:  Inpatient Procedure: 2D Echo, Cardiac Doppler and Color Doppler Indications:    Atrial  Flutter 427.32 / I48.92  History:        Patient has prior history of Echocardiogram examinations, most  recent 10/28/2007. Stroke, Arrythmias:Atrial Fibrillation; Risk                 Factors:Hypertension, Diabetes, Dyslipidemia and Non-Smoker.                 PAD.  Sonographer:    Renella Cunas RDCS Referring Phys: [id number] JARED E SEGAL IMPRESSIONS  1. Left ventricular ejection fraction, by estimation, is 50 to 55%. The left ventricle has low normal function. The left ventricle has no regional wall motion abnormalities. Indeterminate diastolic filling due to E-A fusion.  2. Right ventricular systolic function is mildly reduced. The right ventricular size is normal. There is normal pulmonary artery systolic pressure.  3. The mitral valve is normal in structure. Trivial mitral valve regurgitation.  4. The aortic valve was not well visualized. Aortic valve regurgitation is not visualized. Comparison(s): Compared to prior report in 2009, the patient is now in Afib with RVR with LVEF 50-55%. FINDINGS  Left Ventricle: Left ventricular ejection fraction, by estimation, is 50 to 55%. The left ventricle has low normal function. The left ventricle has no regional wall motion abnormalities. The left ventricular internal cavity size was normal in size. There is no left ventricular hypertrophy. Indeterminate diastolic filling due to E-A fusion. Right Ventricle: The right ventricular size is normal. Right vetricular wall thickness was not well visualized. Right ventricular systolic function is mildly reduced. There is normal pulmonary artery systolic pressure. The tricuspid regurgitant velocity is 2.11 m/s, and with an assumed right atrial pressure of 3 mmHg, the estimated right ventricular systolic pressure is 20.8 mmHg. Left Atrium: Left atrial size was normal in size. Right Atrium: Right atrial size was normal in size. Pericardium: There is no evidence of pericardial effusion. Mitral Valve: The mitral valve is  normal in structure. There is mild thickening of the mitral valve leaflet(s). There is mild calcification of the mitral valve leaflet(s). Mild mitral annular calcification. Trivial mitral valve regurgitation. Tricuspid Valve: The tricuspid valve is normal in structure. Tricuspid valve regurgitation is trivial. Aortic Valve: The aortic valve was not well visualized. Aortic valve regurgitation is not visualized. Pulmonic Valve: The pulmonic valve was normal in structure. Pulmonic valve regurgitation is not visualized. Aorta: The aortic root is normal in size and structure. IAS/Shunts: No atrial level shunt detected by color flow Doppler.  LEFT VENTRICLE PLAX 2D LVIDd:         3.30 cm LVIDs:         3.00 cm LV PW:         0.80 cm LV IVS:        0.80 cm LVOT diam:     1.60 cm LV SV:         20 LV SV Index:   14 LVOT Area:     2.01 cm  LV Volumes (MOD) LV vol d, MOD A4C: 30.2 ml LV vol s, MOD A4C: 18.1 ml LV SV MOD A4C:     30.2 ml RIGHT VENTRICLE TAPSE (M-mode): 1.0 cm LEFT ATRIUM             Index       RIGHT ATRIUM           Index LA diam:        3.60 cm 2.56 cm/m  RA Area:     11.90 cm LA Vol (A2C):   24.9 ml 17.68 ml/m RA Volume:   23.40 ml  16.62 ml/m LA Vol (A4C):   15.2 ml 10.79 ml/m LA Biplane Vol:  20.8 ml 14.77 ml/m  AORTIC VALVE LVOT Vmax:   61.10 cm/s LVOT Vmean:  44.700 cm/s LVOT VTI:    0.099 m  AORTA Ao Root diam: 2.40 cm TRICUSPID VALVE TR Peak grad:   17.8 mmHg TR Vmax:        211.00 cm/s  SHUNTS Systemic VTI:  0.10 m Systemic Diam: 1.60 cm Laurance Flatten MD Electronically signed by Laurance Flatten MD Signature Date/Time: 02/14/2020/2:31:41 PM    Final         Scheduled Meds: . amoxicillin  500 mg Oral TID  . atorvastatin  40 mg Oral Daily  . dabigatran  150 mg Oral BID  . diltiazem  60 mg Oral Q6H  . insulin aspart  0-9 Units Subcutaneous TID WC  . insulin glargine  5 Units Subcutaneous Daily  . latanoprost  1 drop Both Eyes QHS  . lisinopril  10 mg Oral Daily  . metoprolol  tartrate  50 mg Oral BID  . omega-3 acid ethyl esters  1 g Oral Daily  . potassium chloride  40 mEq Oral Once   Continuous Infusions: . magnesium sulfate bolus IVPB       LOS: 2 days    Time spent:40 min    Ressie Slevin, Roselind Messier, MD [facility] Pager [phone]  If 7PM-7AM, please contact night-coverage [email or web address] Password [facility] 02/15/2020, 8:49 AM

## 2020-02-15 NOTE — Progress Notes (Addendum)
Progress Note  Patient Name: [name] Date of Encounter: 02/15/2020  [facility] Cardiologist: Olga Millers, MD   Subjective   No acute overnight events. Patient unaware of being in atrial flutter. No palpitations, chest pain, or shortness of breath.  Inpatient Medications    Scheduled Meds:  amoxicillin  500 mg Oral TID   atorvastatin  40 mg Oral Daily   dabigatran  150 mg Oral BID   diltiazem  60 mg Oral Q6H   insulin aspart  0-9 Units Subcutaneous TID WC   insulin glargine  5 Units Subcutaneous Daily   latanoprost  1 drop Both Eyes QHS   lisinopril  10 mg Oral Daily   metoprolol tartrate  50 mg Oral BID   omega-3 acid ethyl esters  1 g Oral Daily   Continuous Infusions:   PRN Meds: acetaminophen, ondansetron (ZOFRAN) IV   Vital Signs    Vitals:   02/14/20 1828 02/14/20 2050 02/15/20 0019 02/15/20 0455  BP:  112/64 114/66 109/61  Pulse:  87 75 75  Resp:  20 16   Temp: 97.8 F (36.6 C)  97.8 F (36.6 C) 97.7 F (36.5 C)  TempSrc: Axillary  Oral Oral  SpO2:  96% 95% 99%  Weight:      Height:        Intake/Output Summary (Last 24 hours) at 02/15/2020 0820 Last data filed at 02/15/2020 0455 Gross per 24 hour  Intake 972.7 ml  Output 1 ml  Net 971.7 ml   Last 3 Weights 02/12/2020 06/26/2019 04/22/2019  Weight (lbs) 106 lb 97 lb 95 lb  Weight (kg) 48.081 kg 43.999 kg 43.092 kg      Telemetry    Atrial flutter. Rates in the 70's to 80's at rest but will spike as high as 140's with ambulation. - Personally Reviewed  ECG    Rate controlled atrial flutter. No acute ischemic changes. - Personally Reviewed  Physical Exam   GEN: No acute distress.   Neck: No JVD Cardiac: Irregular with normal rate. No murmurs, rubs, or gallops.  Respiratory: Clear to auscultation bilaterally. GI: Soft, non-tender, non-distended  MS: No lower extremity edema. No deformity. Neuro:  No focal deficits. Psych: Normal affect. Responds appropriately.  Labs     High Sensitivity Troponin:  No results for input(s): TROPONINIHS in the last 720 hours.    Chemistry Recent Labs  Lab 02/12/20 1401 02/12/20 1401 02/13/20 1251 02/14/20 0434 02/15/20 0448  NA 139   < > 140 138 134*  K 3.9   < > 3.8 3.8 3.5  CL 105   < > 105 102 102  CO2 23   < > 23 24 21*  GLUCOSE 262*   < > 198* 207* 260*  BUN 19   < > 14 18 32*  CREATININE 0.82   < > 0.80 0.85 1.08*  CALCIUM 9.5   < > 9.6 9.7 9.1  PROT 7.8  --   --   --   --   ALBUMIN 4.0  --   --   --   --   AST 32  --   --   --   --   ALT 33  --   --   --   --   ALKPHOS 66  --   --   --   --   BILITOT 0.6  --   --   --   --   GFRNONAA >60   < > >60 >  60 50*  ANIONGAP 11   < > 12 12 11    < > = values in this interval not displayed.     Hematology Recent Labs  Lab 02/13/20 1251 02/14/20 0434 02/15/20 0448  WBC 7.2 7.2 5.7  RBC 3.52* 3.39* 3.11*  HGB 11.0* 10.7* 9.7*  HCT 33.7* 31.9* 29.6*  MCV 95.7 94.1 95.2  MCH 31.3 31.6 31.2  MCHC 32.6 33.5 32.8  RDW 16.1* 15.8* 16.1*  PLT 206 215 184    BNP Recent Labs  Lab 02/13/20 1251  BNP 222.4*     DDimer No results for input(s): DDIMER in the last 168 hours.   Radiology    ECHOCARDIOGRAM COMPLETE  Result Date: 02/14/2020    ECHOCARDIOGRAM REPORT   Patient Name:   Pam VECCHIARELLI [facility] Date of Exam: 02/14/2020 Medical Rec #:  13/04/2019        Height:       59.0 in Accession #:    [id number]       Weight:       106.0 lb Date of Birth:  1932-08-06       BSA:          1.408 m Patient Age:    86 years         BP:           119/87 mmHg Patient Gender: F                HR:           108 bpm. Exam Location:  Inpatient Procedure: 2D Echo, Cardiac Doppler and Color Doppler Indications:    Atrial Flutter 427.32 / I48.92  History:        Patient has prior history of Echocardiogram examinations, most                 recent 10/28/2007. Stroke, Arrythmias:Atrial Fibrillation; Risk                 Factors:Hypertension, Diabetes, Dyslipidemia and Non-Smoker.                  PAD.  Sonographer:    10/30/2007 RDCS Referring Phys: Renella Cunas JARED E SEGAL IMPRESSIONS  1. Left ventricular ejection fraction, by estimation, is 50 to 55%. The left ventricle has low normal function. The left ventricle has no regional wall motion abnormalities. Indeterminate diastolic filling due to E-A fusion.  2. Right ventricular systolic function is mildly reduced. The right ventricular size is normal. There is normal pulmonary artery systolic pressure.  3. The mitral valve is normal in structure. Trivial mitral valve regurgitation.  4. The aortic valve was not well visualized. Aortic valve regurgitation is not visualized. Comparison(s): Compared to prior report in 2009, the patient is now in Afib with RVR with LVEF 50-55%. FINDINGS  Left Ventricle: Left ventricular ejection fraction, by estimation, is 50 to 55%. The left ventricle has low normal function. The left ventricle has no regional wall motion abnormalities. The left ventricular internal cavity size was normal in size. There is no left ventricular hypertrophy. Indeterminate diastolic filling due to E-A fusion. Right Ventricle: The right ventricular size is normal. Right vetricular wall thickness was not well visualized. Right ventricular systolic function is mildly reduced. There is normal pulmonary artery systolic pressure. The tricuspid regurgitant velocity is 2.11 m/s, and with an assumed right atrial pressure of 3 mmHg, the estimated right ventricular systolic pressure is 20.8 mmHg. Left Atrium: Left atrial size was normal in size.  Right Atrium: Right atrial size was normal in size. Pericardium: There is no evidence of pericardial effusion. Mitral Valve: The mitral valve is normal in structure. There is mild thickening of the mitral valve leaflet(s). There is mild calcification of the mitral valve leaflet(s). Mild mitral annular calcification. Trivial mitral valve regurgitation. Tricuspid Valve: The tricuspid valve is normal in structure.  Tricuspid valve regurgitation is trivial. Aortic Valve: The aortic valve was not well visualized. Aortic valve regurgitation is not visualized. Pulmonic Valve: The pulmonic valve was normal in structure. Pulmonic valve regurgitation is not visualized. Aorta: The aortic root is normal in size and structure. IAS/Shunts: No atrial level shunt detected by color flow Doppler.  LEFT VENTRICLE PLAX 2D LVIDd:         3.30 cm LVIDs:         3.00 cm LV PW:         0.80 cm LV IVS:        0.80 cm LVOT diam:     1.60 cm LV SV:         20 LV SV Index:   14 LVOT Area:     2.01 cm  LV Volumes (MOD) LV vol d, MOD A4C: 30.2 ml LV vol s, MOD A4C: 18.1 ml LV SV MOD A4C:     30.2 ml RIGHT VENTRICLE TAPSE (M-mode): 1.0 cm LEFT ATRIUM             Index       RIGHT ATRIUM           Index LA diam:        3.60 cm 2.56 cm/m  RA Area:     11.90 cm LA Vol (A2C):   24.9 ml 17.68 ml/m RA Volume:   23.40 ml  16.62 ml/m LA Vol (A4C):   15.2 ml 10.79 ml/m LA Biplane Vol: 20.8 ml 14.77 ml/m  AORTIC VALVE LVOT Vmax:   61.10 cm/s LVOT Vmean:  44.700 cm/s LVOT VTI:    0.099 m  AORTA Ao Root diam: 2.40 cm TRICUSPID VALVE TR Peak grad:   17.8 mmHg TR Vmax:        211.00 cm/s  SHUNTS Systemic VTI:  0.10 m Systemic Diam: 1.60 cm Heather Pemberton MD Electronically signed by Heather Pemberton MD Signature Date/Time: 02/14/2020/2:31:41 PM    Final     Cardiac Studies   Echocardiogram 02/14/2020: 1. Left ventricular ejection fraction, by estimation, is 50 to 55%. The  left ventricle has low normal function. The left ventricle has no regional  wall motion abnormalities. Indeterminate diastolic filling due to E-A  fusion.   2. Right ventricular systolic function is mildly reduced. The right  ventricular size is normal. There is normal pulmonary artery systolic  pressure.   3. The mitral valve is normal in structure. Trivial mitral valve  regurgitation.   4. The aortic valve was not well visualized. Aortic valve regurgitation  is not  visualized.   Comparison(s): Compared to prior report in 2009, the patient is now in  Afib with RVR with LVEF 50-55%.   Patient Profile     87 y.o. female with a history of paroxysmal atrial fibrillation on Pradaxa, PAD s/p left above knee bypass in 2012, CVA in 1999, hypertension, hyperlipidemia, type 2 diabetes mellitus, and prior DVT who is being seen today for evaluation of atrial flutter with RVR at the request of Dr. Pfeiffer.  Assessment & Plan    Atrial Flutter with RVR History of Atrial Fibrillation  -   Potassium 3.5 today. Goal >4.0. Will supplement.  - Magnesium 1.5 on admission. Repleted and 1.7 yesterday and 1.6 today. Goal >2.0. Will supplement.  - TSH normal. - Echo showed LVEF of 50-55% with normal wall motion. - Continue PO Lopressor 50mg  twice daily and PO Cardizem 60mg  every 6 hours. - Continue chronic anticoagulation with Pradaxa 150mg  twice daily. She has not missed any doses in the last 4 weeks. - Plan is for DCCV today.   Hypertension - BP soft at times but mostly well controlled. - Continue Lopressor and Cardizem as above. - OK to continue Lisinopril 10mg  daily as well.   Hyperlipidemia - Lipid panel this admission: Total Cholesterol 120, Triglycerides 60, HDL 46, LDL 62. - Continue Lipitor 40mg  daily.  Chronic Anemia - Hemoglobin 9.7 today, down from 10.7 yesterday. Baseline around 10-11 range. - Management per primary team.  For questions or updates, please contact [facility] Please consult [email or web address] for contact info under        Signed, , PA-C  02/15/2020, 8:20 AM    Patient examined chart reviewed Telemetry shows continued flutter Patient confirms no missed doses of Pradaxa. For cardioversion at [facility] Today 2:00 with Dr  Further w/u anemia per primary team. Possible d/c in am if [facility] successful  Corrin Parker MD [facility]

## 2020-02-15 NOTE — CV Procedure (Signed)
° ° °  Cardioversion Note  ALDORA PERMAN [id number] Apr 19, 1932  Procedure: DC Cardioversion Indications: atrial flutter   Procedure Details Consent: Obtained Time Out: Verified patient identification, verified procedure, site/side was marked, verified correct patient position, special equipment/implants available, Radiology Safety Procedures followed,  medications/allergies/relevent history reviewed, required imaging and test results available.  Performed  The patient has been on adequate anticoagulation.  The patient received IV  Lidocaine 80 mg IV followed by Propofol 50 mg IV  for sedation.  Synchronous cardioversion was performed at 50  joules.  The cardioversion was successful.     Complications: No apparent complications Patient did tolerate procedure well.   Vesta Mixer, Montez Hageman., MD, [facility] 02/15/2020, 2:36 PM

## 2020-02-15 NOTE — Transfer of Care (Signed)
Immediate Anesthesia Transfer of Care Note  Patient: [name]  Procedure(s) Performed: CARDIOVERSION (N/A )  Patient Location: Endoscopy Unit  Anesthesia Type:General  Level of Consciousness: awake, alert  and oriented  Airway & Oxygen Therapy: Patient Spontanous Breathing  Post-op Assessment: Report given to RN and Post -op Vital signs reviewed and stable  Post vital signs: Reviewed and stable  Last Vitals:  Vitals Value Taken Time  BP 89/52 02/15/20 1432  Temp    Pulse 58 02/15/20 1433  Resp 18 02/15/20 1433  SpO2 96 % 02/15/20 1433    Last Pain:  Vitals:   02/15/20 1355  TempSrc: Temporal  PainSc: 0-No pain         Complications: No complications documented.

## 2020-02-15 NOTE — Anesthesia Postprocedure Evaluation (Signed)
Anesthesia Post Note  Patient: [name]  Procedure(s) Performed: CARDIOVERSION (N/A )     Patient location during evaluation: PACU Anesthesia Type: General Level of consciousness: sedated Pain management: pain level controlled Vital Signs Assessment: post-procedure vital signs reviewed and stable Respiratory status: spontaneous breathing and respiratory function stable Cardiovascular status: stable Postop Assessment: no apparent nausea or vomiting Anesthetic complications: no   No complications documented.  Last Vitals:  Vitals:   02/15/20 1440 02/15/20 1445  BP: (!) 95/49 (!) 108/54  Pulse: 64 67  Resp: 17 (!) 26  Temp:  37.1 C  SpO2: 95% 98%    Last Pain:  Vitals:   02/15/20 1445  TempSrc: Temporal  PainSc: 0-No pain                 Mellody Dance

## 2020-02-15 NOTE — Anesthesia Preprocedure Evaluation (Addendum)
Anesthesia Evaluation  Patient identified by MRN, date of birth, ID band Patient awake    Reviewed: Allergy & Precautions, NPO status , Patient's Chart, lab work & pertinent test results  Airway Mallampati: II  TM Distance: >3 FB Neck ROM: Full    Dental   Pulmonary neg pulmonary ROS,    Pulmonary exam normal breath sounds clear to auscultation       Cardiovascular hypertension, + Peripheral Vascular Disease  + dysrhythmias Atrial Fibrillation  Rhythm:Irregular Rate:Normal     Neuro/Psych CVA negative psych ROS   GI/Hepatic negative GI ROS, Neg liver ROS,   Endo/Other  diabetes  Renal/GU negative Renal ROS  negative genitourinary   Musculoskeletal negative musculoskeletal ROS (+)   Abdominal   Peds  Hematology negative hematology ROS (+)   Anesthesia Other Findings   Reproductive/Obstetrics negative OB ROS                            Anesthesia Physical Anesthesia Plan  ASA: III  Anesthesia Plan: General   Post-op Pain Management:    Induction:   PONV Risk Score and Plan: 3 and Treatment may vary due to age or medical condition  Airway Management Planned: Mask  Additional Equipment:   Intra-op Plan:   Post-operative Plan:   Informed Consent: I have reviewed the patients History and Physical, chart, labs and discussed the procedure including the risks, benefits and alternatives for the proposed anesthesia with the patient or authorized representative who has indicated his/her understanding and acceptance.     Dental advisory given  Plan Discussed with: Anesthesiologist  Anesthesia Plan Comments:        Anesthesia Quick Evaluation

## 2020-02-15 NOTE — Progress Notes (Signed)
Inpatient Diabetes Program Recommendations  AACE/ADA: New Consensus Statement on Inpatient Glycemic Control (2015)  Target Ranges:  Prepandial:   less than 140 mg/dL      Peak postprandial:   less than 180 mg/dL (1-2 hours)      Critically ill patients:  140 - 180 mg/dL   Lab Results  Component Value Date   GLUCAP 187 (H) 02/15/2020   HGBA1C 8.3 (H) 02/13/2020    Review of Glycemic Control  Diabetes history: DM2 Outpatient Diabetes medications: tradjenta 5 mg QD, Glipizide 10 mg bid, metformin 1000 mg bid Current orders for Inpatient glycemic control: Lantus 5 units QD, Novolog 0-9 units tidwc  Inpatient Diabetes Program Recommendations:     Consider increasing Lantus to 7 units QD Consider adding Novolog 2 units tidwc for meal coverage insulin when diet resumes.   Continue to closely follow.  Thank you. Ailene Ards, RD, LDN, CDE Inpatient Diabetes Coordinator [phone]

## 2020-02-15 NOTE — Interval H&P Note (Signed)
History and Physical Interval Note:  02/15/2020 1:59 PM  [name]  has presented today for surgery, with the diagnosis of AFLUTTECA.  The various methods of treatment have been discussed with the patient and family. After consideration of risks, benefits and other options for treatment, the patient has consented to  Procedure(s): CARDIOVERSION (N/A) as a surgical intervention.  The patient's history has been reviewed, patient examined, no change in status, stable for surgery.  I have reviewed the patient's chart and labs.  Questions were answered to the patient's satisfaction.     [name]

## 2020-02-15 NOTE — TOC Initial Note (Signed)
Transition of Care [facility]) - Initial/Assessment Note    Patient Details  Name: [name] MRN: [id number] Date of Birth: Mar 10, 1933  Transition of Care [facility]) CM/SW Contact:    Lanier Clam, RN Phone Number: 02/15/2020, 3:04 PM  Clinical Narrative: Patient for procedure DCCV @ [facility]. Spoke to dtr [name] on phone-agree to dme-quad cane-[employer] to deliver to rm prior d/c. HHPT recc-[employer] rep Kandee Keen following for HHC.                 Expected Discharge Plan: Home w Home Health Services Barriers to Discharge: Continued Medical Work up   Patient Goals and CMS Choice Patient states their goals for this hospitalization and ongoing recovery are:: go home CMS Medicare.gov Compare Post Acute Care list provided to:: Patient Represenative (must comment) Choice offered to / list presented to : Adult Children  Expected Discharge Plan and Services Expected Discharge Plan: Home w Home Health Services   Discharge Planning Services: CM Consult Post Acute Care Choice: Durable Medical Equipment, Home Health Living arrangements for the past 2 months: Single Family Home                 DME Arranged: Cane (Quad cane) DME Agency: [employer] Date DME Agency Contacted: 02/15/20 Time DME Agency Contacted: [phone] Representative spoke with at DME Agency: Velna Hatchet HH Arranged: PT HH Agency: [facility] Date [facility] Agency Contacted: 02/15/20 Time HH Agency Contacted: [phone] Representative spoke with at [facility] Agency: Kandee Keen  Prior Living Arrangements/Services Living arrangements for the past 2 months: Single Family Home Lives with:: Adult Children Patient language and need for interpreter reviewed:: Yes Do you feel safe going back to the place where you live?: Yes      Need for Family Participation in Patient Care: No (Comment) Care giver support system in place?: Yes (comment)   Criminal Activity/Legal Involvement Pertinent to Current Situation/Hospitalization: No - Comment as  needed  Activities of Daily Living Home Assistive Devices/Equipment: Eyeglasses, CBG Meter ADL Screening (condition at time of admission) Patient's cognitive ability adequate to safely complete daily activities?: Yes Is the patient deaf or have difficulty hearing?: No Does the patient have difficulty seeing, even when wearing glasses/contacts?: No Does the patient have difficulty concentrating, remembering, or making decisions?: No Patient able to express need for assistance with ADLs?: Yes Does the patient have difficulty dressing or bathing?: No Independently performs ADLs?: Yes (appropriate for developmental age) Does the patient have difficulty walking or climbing stairs?: No Weakness of Legs: None Weakness of Arms/Hands: None  Permission Sought/Granted Permission sought to share information with : Case Manager Permission granted to share information with : Yes, Verbal Permission Granted  Share Information with NAME: Case Manager     Permission granted to share info w Relationship: [name] dtr [phone]     Emotional Assessment Appearance:: Appears stated age Attitude/Demeanor/Rapport: Gracious Affect (typically observed): Accepting Orientation: : Oriented to Self, Oriented to Place, Oriented to  Time, Oriented to Situation Alcohol / Substance Use: Not Applicable Psych Involvement: No (comment)  Admission diagnosis:  Atrial flutter (HCC) [I48.92] Atrial flutter with rapid ventricular response (HCC) [I48.92] Atrial flutter, unspecified type [facility]) [I48.92] Patient Active Problem List   Diagnosis Date Noted  . Need for prophylactic antibiotic 02/14/2020  . Atrial flutter with rapid ventricular response (HCC) 02/13/2020  . Hypomagnesemia 02/13/2020  . Atrial flutter (HCC) 02/12/2020  . Cramp of both lower extremities 12/15/2014  . Foot cramps 12/15/2014  . Stiffness of foot 06/10/2014  . Aftercare  following surgery of the circulatory system, NEC 12/09/2013  . Peripheral  vascular disease (HCC) 10/03/2011  . Bypass graft stenosis (HCC) 07/04/2011  . Chronic total occlusion of artery of the extremities (HCC) 07/04/2011  . PURE HYPERCHOLESTEROLEMIA 03/29/2009  . ESSENTIAL HYPERTENSION, MALIGNANT 03/29/2009  . Essential hypertension 03/28/2009  . Type 2 diabetes mellitus with hyperlipidemia (HCC) 08/30/2008  . HYPERLIPIDEMIA-MIXED 08/30/2008  . ATRIAL FIBRILLATION 08/30/2008  . SYNCOPE AND COLLAPSE 08/30/2008   PCP:  [name], MD Pharmacy:   [facility] [phone], [place] - [address] [place] [address] Phone: [phone] Fax: [phone]     Social Determinants of Health (SDOH) Interventions    Readmission Risk Interventions No flowsheet data found.

## 2020-02-15 NOTE — H&P (View-Only) (Signed)
Progress Note  Patient Name: [name] Date of Encounter: 02/15/2020  [facility] Cardiologist: Olga Millers, MD   Subjective   No acute overnight events. Patient unaware of being in atrial flutter. No palpitations, chest pain, or shortness of breath.  Inpatient Medications    Scheduled Meds:  amoxicillin  500 mg Oral TID   atorvastatin  40 mg Oral Daily   dabigatran  150 mg Oral BID   diltiazem  60 mg Oral Q6H   insulin aspart  0-9 Units Subcutaneous TID WC   insulin glargine  5 Units Subcutaneous Daily   latanoprost  1 drop Both Eyes QHS   lisinopril  10 mg Oral Daily   metoprolol tartrate  50 mg Oral BID   omega-3 acid ethyl esters  1 g Oral Daily   Continuous Infusions:   PRN Meds: acetaminophen, ondansetron (ZOFRAN) IV   Vital Signs    Vitals:   02/14/20 1828 02/14/20 2050 02/15/20 0019 02/15/20 0455  BP:  112/64 114/66 109/61  Pulse:  87 75 75  Resp:  20 16   Temp: 97.8 F (36.6 C)  97.8 F (36.6 C) 97.7 F (36.5 C)  TempSrc: Axillary  Oral Oral  SpO2:  96% 95% 99%  Weight:      Height:        Intake/Output Summary (Last 24 hours) at 02/15/2020 0820 Last data filed at 02/15/2020 0455 Gross per 24 hour  Intake 972.7 ml  Output 1 ml  Net 971.7 ml   Last 3 Weights 02/12/2020 06/26/2019 04/22/2019  Weight (lbs) 106 lb 97 lb 95 lb  Weight (kg) 48.081 kg 43.999 kg 43.092 kg      Telemetry    Atrial flutter. Rates in the 70's to 80's at rest but will spike as high as 140's with ambulation. - Personally Reviewed  ECG    Rate controlled atrial flutter. No acute ischemic changes. - Personally Reviewed  Physical Exam   GEN: No acute distress.   Neck: No JVD Cardiac: Irregular with normal rate. No murmurs, rubs, or gallops.  Respiratory: Clear to auscultation bilaterally. GI: Soft, non-tender, non-distended  MS: No lower extremity edema. No deformity. Neuro:  No focal deficits. Psych: Normal affect. Responds appropriately.  Labs     High Sensitivity Troponin:  No results for input(s): TROPONINIHS in the last 720 hours.    Chemistry Recent Labs  Lab 02/12/20 1401 02/12/20 1401 02/13/20 1251 02/14/20 0434 02/15/20 0448  NA 139   < > 140 138 134*  K 3.9   < > 3.8 3.8 3.5  CL 105   < > 105 102 102  CO2 23   < > 23 24 21*  GLUCOSE 262*   < > 198* 207* 260*  BUN 19   < > 14 18 32*  CREATININE 0.82   < > 0.80 0.85 1.08*  CALCIUM 9.5   < > 9.6 9.7 9.1  PROT 7.8  --   --   --   --   ALBUMIN 4.0  --   --   --   --   AST 32  --   --   --   --   ALT 33  --   --   --   --   ALKPHOS 66  --   --   --   --   BILITOT 0.6  --   --   --   --   GFRNONAA >60   < > >60 >  60 50*  ANIONGAP 11   < > 12 12 11    < > = values in this interval not displayed.     Hematology Recent Labs  Lab 02/13/20 1251 02/14/20 0434 02/15/20 0448  WBC 7.2 7.2 5.7  RBC 3.52* 3.39* 3.11*  HGB 11.0* 10.7* 9.7*  HCT 33.7* 31.9* 29.6*  MCV 95.7 94.1 95.2  MCH 31.3 31.6 31.2  MCHC 32.6 33.5 32.8  RDW 16.1* 15.8* 16.1*  PLT 206 215 184    BNP Recent Labs  Lab 02/13/20 1251  BNP 222.4*     DDimer No results for input(s): DDIMER in the last 168 hours.   Radiology    ECHOCARDIOGRAM COMPLETE  Result Date: 02/14/2020    ECHOCARDIOGRAM REPORT   Patient Name:   MEAGHEN VECCHIARELLI [facility] Date of Exam: 02/14/2020 Medical Rec #:  13/04/2019        Height:       59.0 in Accession #:    [id number]       Weight:       106.0 lb Date of Birth:  1932-08-06       BSA:          1.408 m Patient Age:    86 years         BP:           119/87 mmHg Patient Gender: F                HR:           108 bpm. Exam Location:  Inpatient Procedure: 2D Echo, Cardiac Doppler and Color Doppler Indications:    Atrial Flutter 427.32 / I48.92  History:        Patient has prior history of Echocardiogram examinations, most                 recent 10/28/2007. Stroke, Arrythmias:Atrial Fibrillation; Risk                 Factors:Hypertension, Diabetes, Dyslipidemia and Non-Smoker.                  PAD.  Sonographer:    10/30/2007 RDCS Referring Phys: Renella Cunas JARED E SEGAL IMPRESSIONS  1. Left ventricular ejection fraction, by estimation, is 50 to 55%. The left ventricle has low normal function. The left ventricle has no regional wall motion abnormalities. Indeterminate diastolic filling due to E-A fusion.  2. Right ventricular systolic function is mildly reduced. The right ventricular size is normal. There is normal pulmonary artery systolic pressure.  3. The mitral valve is normal in structure. Trivial mitral valve regurgitation.  4. The aortic valve was not well visualized. Aortic valve regurgitation is not visualized. Comparison(s): Compared to prior report in 2009, the patient is now in Afib with RVR with LVEF 50-55%. FINDINGS  Left Ventricle: Left ventricular ejection fraction, by estimation, is 50 to 55%. The left ventricle has low normal function. The left ventricle has no regional wall motion abnormalities. The left ventricular internal cavity size was normal in size. There is no left ventricular hypertrophy. Indeterminate diastolic filling due to E-A fusion. Right Ventricle: The right ventricular size is normal. Right vetricular wall thickness was not well visualized. Right ventricular systolic function is mildly reduced. There is normal pulmonary artery systolic pressure. The tricuspid regurgitant velocity is 2.11 m/s, and with an assumed right atrial pressure of 3 mmHg, the estimated right ventricular systolic pressure is 20.8 mmHg. Left Atrium: Left atrial size was normal in size.  Right Atrium: Right atrial size was normal in size. Pericardium: There is no evidence of pericardial effusion. Mitral Valve: The mitral valve is normal in structure. There is mild thickening of the mitral valve leaflet(s). There is mild calcification of the mitral valve leaflet(s). Mild mitral annular calcification. Trivial mitral valve regurgitation. Tricuspid Valve: The tricuspid valve is normal in structure.  Tricuspid valve regurgitation is trivial. Aortic Valve: The aortic valve was not well visualized. Aortic valve regurgitation is not visualized. Pulmonic Valve: The pulmonic valve was normal in structure. Pulmonic valve regurgitation is not visualized. Aorta: The aortic root is normal in size and structure. IAS/Shunts: No atrial level shunt detected by color flow Doppler.  LEFT VENTRICLE PLAX 2D LVIDd:         3.30 cm LVIDs:         3.00 cm LV PW:         0.80 cm LV IVS:        0.80 cm LVOT diam:     1.60 cm LV SV:         20 LV SV Index:   14 LVOT Area:     2.01 cm  LV Volumes (MOD) LV vol d, MOD A4C: 30.2 ml LV vol s, MOD A4C: 18.1 ml LV SV MOD A4C:     30.2 ml RIGHT VENTRICLE TAPSE (M-mode): 1.0 cm LEFT ATRIUM             Index       RIGHT ATRIUM           Index LA diam:        3.60 cm 2.56 cm/m  RA Area:     11.90 cm LA Vol (A2C):   24.9 ml 17.68 ml/m RA Volume:   23.40 ml  16.62 ml/m LA Vol (A4C):   15.2 ml 10.79 ml/m LA Biplane Vol: 20.8 ml 14.77 ml/m  AORTIC VALVE LVOT Vmax:   61.10 cm/s LVOT Vmean:  44.700 cm/s LVOT VTI:    0.099 m  AORTA Ao Root diam: 2.40 cm TRICUSPID VALVE TR Peak grad:   17.8 mmHg TR Vmax:        211.00 cm/s  SHUNTS Systemic VTI:  0.10 m Systemic Diam: 1.60 cm Laurance Flatten MD Electronically signed by Laurance Flatten MD Signature Date/Time: 02/14/2020/2:31:41 PM    Final     Cardiac Studies   Echocardiogram 02/14/2020: 1. Left ventricular ejection fraction, by estimation, is 50 to 55%. The  left ventricle has low normal function. The left ventricle has no regional  wall motion abnormalities. Indeterminate diastolic filling due to E-A  fusion.   2. Right ventricular systolic function is mildly reduced. The right  ventricular size is normal. There is normal pulmonary artery systolic  pressure.   3. The mitral valve is normal in structure. Trivial mitral valve  regurgitation.   4. The aortic valve was not well visualized. Aortic valve regurgitation  is not  visualized.   Comparison(s): Compared to prior report in 2009, the patient is now in  Afib with RVR with LVEF 50-55%.   Patient Profile     86 y.o. female with a history of paroxysmal atrial fibrillation on Pradaxa, PAD s/p left above knee bypass in 2012, CVA in 1999, hypertension, hyperlipidemia, type 2 diabetes mellitus, and prior DVT who is being seen today for evaluation of atrial flutter with RVR at the request of Dr. Donnald Garre.  Assessment & Plan    Atrial Flutter with RVR History of Atrial Fibrillation  -  Potassium 3.5 today. Goal >4.0. Will supplement.  - Magnesium 1.5 on admission. Repleted and 1.7 yesterday and 1.6 today. Goal >2.0. Will supplement.  - TSH normal. - Echo showed LVEF of 50-55% with normal wall motion. - Continue PO Lopressor 50mg  twice daily and PO Cardizem 60mg  every 6 hours. - Continue chronic anticoagulation with Pradaxa 150mg  twice daily. She has not missed any doses in the last 4 weeks. - Plan is for DCCV today.   Hypertension - BP soft at times but mostly well controlled. - Continue Lopressor and Cardizem as above. - OK to continue Lisinopril 10mg  daily as well.   Hyperlipidemia - Lipid panel this admission: Total Cholesterol 120, Triglycerides 60, HDL 46, LDL 62. - Continue Lipitor 40mg  daily.  Chronic Anemia - Hemoglobin 9.7 today, down from 10.7 yesterday. Baseline around 10-11 range. - Management per primary team.  For questions or updates, please contact [facility] Please consult [email or web address] for contact info under        Signed, , PA-C  02/15/2020, 8:20 AM    Patient examined chart reviewed Telemetry shows continued flutter Patient confirms no missed doses of Pradaxa. For cardioversion at [facility] Today 2:00 with Dr  Further w/u anemia per primary team. Possible d/c in am if [facility] successful  Corrin Parker MD [facility]

## 2020-02-16 DIAGNOSIS — I4892 Unspecified atrial flutter: Secondary | ICD-10-CM | POA: Diagnosis not present

## 2020-02-16 LAB — CBC WITH DIFFERENTIAL/PLATELET
Abs Immature Granulocytes: 0.01 10*3/uL (ref 0.00–0.07)
Basophils Absolute: 0 10*3/uL (ref 0.0–0.1)
Basophils Relative: 0 %
Eosinophils Absolute: 0.1 10*3/uL (ref 0.0–0.5)
Eosinophils Relative: 1 %
HCT: 29.1 % — ABNORMAL LOW (ref 36.0–46.0)
Hemoglobin: 9.5 g/dL — ABNORMAL LOW (ref 12.0–15.0)
Immature Granulocytes: 0 %
Lymphocytes Relative: 16 %
Lymphs Abs: 0.9 10*3/uL (ref 0.7–4.0)
MCH: 31.8 pg (ref 26.0–34.0)
MCHC: 32.6 g/dL (ref 30.0–36.0)
MCV: 97.3 fL (ref 80.0–100.0)
Monocytes Absolute: 0.6 10*3/uL (ref 0.1–1.0)
Monocytes Relative: 11 %
Neutro Abs: 3.7 10*3/uL (ref 1.7–7.7)
Neutrophils Relative %: 72 %
Platelets: 197 10*3/uL (ref 150–400)
RBC: 2.99 MIL/uL — ABNORMAL LOW (ref 3.87–5.11)
RDW: 16.4 % — ABNORMAL HIGH (ref 11.5–15.5)
WBC: 5.3 10*3/uL (ref 4.0–10.5)
nRBC: 0 % (ref 0.0–0.2)

## 2020-02-16 LAB — BASIC METABOLIC PANEL
Anion gap: 10 (ref 5–15)
BUN: 27 mg/dL — ABNORMAL HIGH (ref 8–23)
CO2: 21 mmol/L — ABNORMAL LOW (ref 22–32)
Calcium: 9 mg/dL (ref 8.9–10.3)
Chloride: 108 mmol/L (ref 98–111)
Creatinine, Ser: 0.89 mg/dL (ref 0.44–1.00)
GFR, Estimated: >60 mL/min (ref >60)
Glucose, Bld: 220 mg/dL — ABNORMAL HIGH (ref 70–99)
Potassium: 4.5 mmol/L (ref 3.5–5.1)
Sodium: 139 mmol/L (ref 135–145)

## 2020-02-16 LAB — GLUCOSE, CAPILLARY
Glucose-Capillary: 75 mg/dL (ref 70–99)
Glucose-Capillary: 195 mg/dL — ABNORMAL HIGH (ref 70–99)
Glucose-Capillary: 348 mg/dL — ABNORMAL HIGH (ref 70–99)

## 2020-02-16 LAB — LIPID PANEL
Cholesterol: 105 mg/dL (ref 0–200)
HDL: 37 mg/dL — ABNORMAL LOW (ref >40)
LDL Cholesterol: 59 mg/dL (ref 0–99)
Total CHOL/HDL Ratio: 2.8 RATIO
Triglycerides: 43 mg/dL (ref <150)
VLDL: 9 mg/dL (ref 0–40)

## 2020-02-16 LAB — PHOSPHORUS: Phosphorus: 5.2 mg/dL — ABNORMAL HIGH (ref 2.5–4.6)

## 2020-02-16 LAB — MAGNESIUM: Magnesium: 1.8 mg/dL (ref 1.7–2.4)

## 2020-02-16 MED ORDER — DILTIAZEM HCL 60 MG PO TABS
60.0000 mg | ORAL_TABLET | Freq: Four times a day (QID) | ORAL | 0 refills | Status: DC
Start: 2020-02-16 — End: 2020-11-23

## 2020-02-16 MED ORDER — METOPROLOL TARTRATE 50 MG PO TABS
50.0000 mg | ORAL_TABLET | Freq: Two times a day (BID) | ORAL | 0 refills | Status: DC
Start: 2020-02-16 — End: 2020-11-23

## 2020-02-16 NOTE — Discharge Summary (Signed)
Physician Discharge Summary  [name] FHL:[id number] DOB: [birth year] DOA: 02/12/2020  PCP: [name], MD  Admit date: 02/12/2020 Discharge date: 02/16/2020  Admitted From: Home Disposition: Home  Recommendations for Outpatient Follow-up:  1. Follow up with PCP in 1 week with repeat CBC/BMP 2. Outpatient follow-up with cardiology 3. Follow up in ED if symptoms worsen or new appear   Home Health: No Equipment/Devices: None  Discharge Condition: Stable CODE STATUS: Full Diet recommendation: Heart healthy/carb modified  Brief/Interim Summary: 86 year old female with history of paroxysmal A. fib on Pradaxa, PAD status post left above-knee bypass in 2012, unspecified CVA, hypertension, hyperlipidemia, diabetes mellitus type 2 presented with elevated heart rate.  On presentation, her heart rate was up to the 150s and she was started on Cardizem drip.  Cardiology was consulted.  She underwent DCCV on 02/15/2020.  Subsequently, her heart rate has remained controlled.  Cardiology has cleared the patient for discharge with outpatient follow-up with cardiology.  She'll be discharged home today with home health PT.  Discharge Diagnoses:   Atrial flutter with rapid ventricular response History of paroxysmal atrial fibrillation -Presented with heart rate in the 150s requiring Cardizem drip.  Cardiology was consulted. - She underwent DCCV on 02/15/2020.  Subsequently, her heart rate has remained controlled.  Cardiology has cleared the patient for discharge with outpatient follow-up with cardiology -Continue Cardizem 60 mg every 6 hours along with metoprolol 50 mg twice daily and Pradaxa as per cardiology recommendations.  Essential hypertension -Blood pressure stable.  Continue Cardizem and metoprolol as above.  Continue lisinopril.  Amlodipine held during the hospitalization; DC amlodipine on discharge.  Coreg has been changed to metoprolol.  Outpatient follow-up  Diabetes mellitus type  2 with hyperglycemia -A1c 8.3.  Continue home regimen.  Carb modified diet.  Hyperlipidemia -Continue statin  Discharge Instructions  Discharge Instructions    Ambulatory referral to Cardiology   Complete by: As directed    Hospital followup   Diet - low sodium heart healthy   Complete by: As directed    Diet Carb Modified   Complete by: As directed    Face-to-face encounter (required for Medicare/Medicaid patients)   Complete by: As directed    I [name] certify that this patient is under my care and that I, or a nurse practitioner or physician's assistant working with me, had a face-to-face encounter that meets the physician face-to-face encounter requirements with this patient on 02/16/2020. The encounter with the patient was in whole, or in part for the following medical condition(s) which is the primary reason for home health care (List medical condition): afib/weakness   The encounter with the patient was in whole, or in part, for the following medical condition, which is the primary reason for home health care: afib/weakness   I certify that, based on my findings, the following services are medically necessary home health services: Physical therapy   Reason for Medically Necessary Home Health Services: Therapy- Therapeutic Exercises to Increase Strength and Endurance   My clinical findings support the need for the above services: Shortness of breath with activity   Further, I certify that my clinical findings support that this patient is homebound due to: Shortness of Breath with activity   Home Health   Complete by: As directed    To provide the following care/treatments: PT   Increase activity slowly   Complete by: As directed      Allergies as of 02/16/2020   No Known Allergies     Medication  List    STOP taking these medications   amLODipine 10 MG tablet Commonly known as: NORVASC   carvedilol 12.5 MG tablet Commonly known as: COREG     TAKE these  medications   Accu-Chek Softclix Lancets lancets   amoxicillin 500 MG capsule Commonly known as: AMOXIL Take 500 mg by mouth 3 (three) times daily. Start date : 02/09/20   atorvastatin 40 MG tablet Commonly known as: LIPITOR TAKE 1 TABLET BY MOUTH ONCE A DAY   cholecalciferol 25 MCG (1000 UNIT) tablet Commonly known as: VITAMIN D3 Take 1,000 Units by mouth daily.   dabigatran 150 MG Caps capsule Commonly known as: Pradaxa Take 1 capsule (150 mg total) by mouth 2 (two) times daily.   diltiazem 60 MG tablet Commonly known as: CARDIZEM Take 1 tablet (60 mg total) by mouth every 6 (six) hours.   fish oil-omega-3 fatty acids 1000 MG capsule Take 1 g by mouth daily.   glipiZIDE 10 MG tablet Commonly known as: GLUCOTROL Take 10 mg by mouth 2 (two) times daily before a meal.   latanoprost 0.005 % ophthalmic solution Commonly known as: XALATAN Place 1 drop into both eyes at bedtime.   linagliptin 5 MG Tabs tablet Commonly known as: TRADJENTA Take 1 tablet (5 mg total) by mouth daily.   lisinopril 10 MG tablet Commonly known as: ZESTRIL TAKE 1 TABLET DAILY   metFORMIN 500 MG tablet Commonly known as: GLUCOPHAGE Take 1,000 mg by mouth 2 (two) times daily with a meal.   metoprolol tartrate 50 MG tablet Commonly known as: LOPRESSOR Take 1 tablet (50 mg total) by mouth 2 (two) times daily.   TRUEtest Test test strip Generic drug: glucose blood            Durable Medical Equipment  (From admission, onward)         Start     Ordered   02/15/20 1501  For home use only DME Cane  Once       Comments: [facility] cane   02/15/20 1501          Follow-up Information    [facility] Follow up.   Why: [facility] Contact information: [address] [place] [address] [phone]        [facility] Follow up.   Specialty: Home Health Services Why: [facility] physical therapy Contact information: [address] [place]  [address] [phone]        [name], MD. Schedule an appointment as soon as possible for a visit in 1 week(s).   Specialty: Family Medicine Contact information: [address] [place] [address] [phone]        [name] Asters, NP Follow up.   Specialty: Cardiology Why: Hospital follow-up scheduled for 03/22/2020 at 8:15am with Reymundo Poll, one of Dr. Ludwig Clarks NPs. Please arrive 15 minutes early for check-in. If this date/time does not work for you, please call our office to reschedule. Contact information: [address] [place] [address] [phone]              No Known Allergies  Consultations:  Cardiology   Procedures/Studies: ECHOCARDIOGRAM COMPLETE  Result Date: 02/14/2020    ECHOCARDIOGRAM REPORT   Patient Name:   MARLISSA EMERICK Date of Exam: 02/14/2020 Medical Rec #:  [id number]        Height:       59.0 in Accession #:    [id number]  Weight:       106.0 lb Date of Birth:  02/04/1933       BSA:          1.408 m Patient Age:    87 years         BP:           119/87 mmHg Patient Gender: F                HR:           108 bpm. Exam Location:  Inpatient Procedure: 2D Echo, Cardiac Doppler and Color Doppler Indications:    Atrial Flutter 427.32 / I48.92  History:        Patient has prior history of Echocardiogram examinations, most                 recent 10/28/2007. Stroke, Arrythmias:Atrial Fibrillation; Risk                 Factors:Hypertension, Diabetes, Dyslipidemia and Non-Smoker.                 PAD.  Sonographer:    Renella Cunas RDCS Referring Phys: [id number] JARED E SEGAL IMPRESSIONS  1. Left ventricular ejection fraction, by estimation, is 50 to 55%. The left ventricle has low normal function. The left ventricle has no regional wall motion abnormalities. Indeterminate diastolic filling due to E-A fusion.  2. Right ventricular systolic function is mildly reduced. The right ventricular size is normal. There is normal pulmonary  artery systolic pressure.  3. The mitral valve is normal in structure. Trivial mitral valve regurgitation.  4. The aortic valve was not well visualized. Aortic valve regurgitation is not visualized. Comparison(s): Compared to prior report in 2009, the patient is now in Afib with RVR with LVEF 50-55%. FINDINGS  Left Ventricle: Left ventricular ejection fraction, by estimation, is 50 to 55%. The left ventricle has low normal function. The left ventricle has no regional wall motion abnormalities. The left ventricular internal cavity size was normal in size. There is no left ventricular hypertrophy. Indeterminate diastolic filling due to E-A fusion. Right Ventricle: The right ventricular size is normal. Right vetricular wall thickness was not well visualized. Right ventricular systolic function is mildly reduced. There is normal pulmonary artery systolic pressure. The tricuspid regurgitant velocity is 2.11 m/s, and with an assumed right atrial pressure of 3 mmHg, the estimated right ventricular systolic pressure is 20.8 mmHg. Left Atrium: Left atrial size was normal in size. Right Atrium: Right atrial size was normal in size. Pericardium: There is no evidence of pericardial effusion. Mitral Valve: The mitral valve is normal in structure. There is mild thickening of the mitral valve leaflet(s). There is mild calcification of the mitral valve leaflet(s). Mild mitral annular calcification. Trivial mitral valve regurgitation. Tricuspid Valve: The tricuspid valve is normal in structure. Tricuspid valve regurgitation is trivial. Aortic Valve: The aortic valve was not well visualized. Aortic valve regurgitation is not visualized. Pulmonic Valve: The pulmonic valve was normal in structure. Pulmonic valve regurgitation is not visualized. Aorta: The aortic root is normal in size and structure. IAS/Shunts: No atrial level shunt detected by color flow Doppler.  LEFT VENTRICLE PLAX 2D LVIDd:         3.30 cm LVIDs:         3.00 cm LV  PW:         0.80 cm LV IVS:        0.80 cm LVOT diam:     1.60  cm LV SV:         20 LV SV Index:   14 LVOT Area:     2.01 cm  LV Volumes (MOD) LV vol d, MOD A4C: 30.2 ml LV vol s, MOD A4C: 18.1 ml LV SV MOD A4C:     30.2 ml RIGHT VENTRICLE TAPSE (M-mode): 1.0 cm LEFT ATRIUM             Index       RIGHT ATRIUM           Index LA diam:        3.60 cm 2.56 cm/m  RA Area:     11.90 cm LA Vol (A2C):   24.9 ml 17.68 ml/m RA Volume:   23.40 ml  16.62 ml/m LA Vol (A4C):   15.2 ml 10.79 ml/m LA Biplane Vol: 20.8 ml 14.77 ml/m  AORTIC VALVE LVOT Vmax:   61.10 cm/s LVOT Vmean:  44.700 cm/s LVOT VTI:    0.099 m  AORTA Ao Root diam: 2.40 cm TRICUSPID VALVE TR Peak grad:   17.8 mmHg TR Vmax:        211.00 cm/s  SHUNTS Systemic VTI:  0.10 m Systemic Diam: 1.60 cm Laurance Flatten MD Electronically signed by Laurance Flatten MD Signature Date/Time: 02/14/2020/2:31:41 PM    Final     DCCV on 02/15/2020   Subjective: Patient seen and examined at bedside.  Denies current chest pain, palpitations, nausea, vomiting.  Feels better and feels okay to go home today.  Discharge Exam: Vitals:   02/16/20 0001 02/16/20 0408  BP: 108/62 125/68  Pulse: 74 65  Resp:  18  Temp:  97.9 F (36.6 C)  SpO2:  100%    General: Pt is alert, awake, not in acute distress.  Elderly female lying in bed. Cardiovascular: rate controlled, S1/S2 + Respiratory: bilateral decreased breath sounds at bases with some scattered crackles Abdominal: Soft, NT, ND, bowel sounds + Extremities: no edema, no cyanosis    The results of significant diagnostics from this hospitalization (including imaging, microbiology, ancillary and laboratory) are listed below for reference.     Microbiology: Recent Results (from the past 240 hour(s))  Respiratory Panel by RT PCR (Flu A&B, Covid) - Nasopharyngeal Swab     Status: None   Collection Time: 02/12/20  7:20 PM   Specimen: Nasopharyngeal Swab  Result Value Ref Range Status   SARS  Coronavirus 2 by RT PCR NEGATIVE NEGATIVE Final    Comment: (NOTE) SARS-CoV-2 target nucleic acids are NOT DETECTED.  The SARS-CoV-2 RNA is generally detectable in upper respiratoy specimens during the acute phase of infection. The lowest concentration of SARS-CoV-2 viral copies this assay can detect is 131 copies/mL. A negative result does not preclude SARS-Cov-2 infection and should not be used as the sole basis for treatment or other patient management decisions. A negative result may occur with  improper specimen collection/handling, submission of specimen other than nasopharyngeal swab, presence of viral mutation(s) within the areas targeted by this assay, and inadequate number of viral copies (<131 copies/mL). A negative result must be combined with clinical observations, patient history, and epidemiological information. The expected result is Negative.  Fact Sheet for Patients:  https://www.moore.com/  Fact Sheet for Healthcare Providers:  https://www.young.biz/  This test is no t yet approved or cleared by the [place] FDA and  has been authorized for detection and/or diagnosis of SARS-CoV-2 by FDA under an Emergency Use Authorization (EUA). This EUA will remain  in effect (  meaning this test can be used) for the duration of the COVID-19 declaration under Section 564(b)(1) of the Act, 21 U.S.C. section 360bbb-3(b)(1), unless the authorization is terminated or revoked sooner.     Influenza A by PCR NEGATIVE NEGATIVE Final   Influenza B by PCR NEGATIVE NEGATIVE Final    Comment: (NOTE) The Xpert Xpress SARS-CoV-2/FLU/RSV assay is intended as an aid in  the diagnosis of influenza from Nasopharyngeal swab specimens and  should not be used as a sole basis for treatment. Nasal washings and  aspirates are unacceptable for Xpert Xpress SARS-CoV-2/FLU/RSV  testing.  Fact Sheet for  Patients: https://www.moore.com/  Fact Sheet for Healthcare Providers: https://www.young.biz/  This test is not yet approved or cleared by the [place] FDA and  has been authorized for detection and/or diagnosis of SARS-CoV-2 by  FDA under an Emergency Use Authorization (EUA). This EUA will remain  in effect (meaning this test can be used) for the duration of the  Covid-19 declaration under Section 564(b)(1) of the Act, 21  U.S.C. section 360bbb-3(b)(1), unless the authorization is  terminated or revoked. Performed at [facility], [address] [place], [place] [address]      Labs: BNP (last 3 results) Recent Labs    02/13/20 1251  BNP 222.4*   Basic Metabolic Panel: Recent Labs  Lab 02/12/20 1401 02/13/20 1251 02/14/20 0434 02/15/20 0448 02/16/20 0513  NA 139 140 138 134* 139  K 3.9 3.8 3.8 3.5 4.5  CL 105 105 102 102 108  CO2 23 23 24  21* 21*  GLUCOSE 262* 198* 207* 260* 220*  BUN 19 14 18  32* 27*  CREATININE 0.82 0.80 0.85 1.08* 0.89  CALCIUM 9.5 9.6 9.7 9.1 9.0  MG 1.5* 1.7  --  1.6* 1.8  PHOS  --   --   --   --  5.2*   Liver Function Tests: Recent Labs  Lab 02/12/20 1401  AST 32  ALT 33  ALKPHOS 66  BILITOT 0.6  PROT 7.8  ALBUMIN 4.0   No results for input(s): LIPASE, AMYLASE in the last 168 hours. No results for input(s): AMMONIA in the last 168 hours. CBC: Recent Labs  Lab 02/12/20 1401 02/13/20 1251 02/14/20 0434 02/15/20 0448 02/16/20 0513  WBC 4.7 7.2 7.2 5.7 5.3  NEUTROABS 3.1 5.0  --  3.3 3.7  HGB 9.8* 11.0* 10.7* 9.7* 9.5*  HCT 29.6* 33.7* 31.9* 29.6* 29.1*  MCV 95.8 95.7 94.1 95.2 97.3  PLT 195 206 215 184 197   Cardiac Enzymes: No results for input(s): CKTOTAL, CKMB, CKMBINDEX, TROPONINI in the last 168 hours. BNP: Invalid input(s): POCBNP CBG: Recent Labs  Lab 02/15/20 2049 02/15/20 2105 02/15/20 2356 02/16/20 0405 02/16/20 0728  GLUCAP 418* 372* 120* 75  195*   D-Dimer No results for input(s): DDIMER in the last 72 hours. Hgb A1c Recent Labs    02/13/20 1251  HGBA1C 8.3*   Lipid Profile Recent Labs    02/13/20 1251 02/16/20 0513  CHOL 120 105  HDL 46 37*  LDLCALC 62 59  TRIG 60 43  CHOLHDL 2.6 2.8   Thyroid function studies Recent Labs    02/13/20 1251  TSH 2.718   Anemia work up No results for input(s): VITAMINB12, FOLATE, FERRITIN, TIBC, IRON, RETICCTPCT in the last 72 hours. Urinalysis    Component Value Date/Time   COLORURINE STRAW (A) 06/26/2019 0457   APPEARANCEUR CLEAR 06/26/2019 0457   LABSPEC 1.006 06/26/2019 0457   PHURINE 7.0 06/26/2019  0457   GLUCOSEU >=500 (A) 06/26/2019 0457   HGBUR NEGATIVE 06/26/2019 0457   BILIRUBINUR NEGATIVE 06/26/2019 0457   KETONESUR NEGATIVE 06/26/2019 0457   PROTEINUR NEGATIVE 06/26/2019 0457   UROBILINOGEN 0.2 10/28/2007 0214   NITRITE NEGATIVE 06/26/2019 0457   LEUKOCYTESUR NEGATIVE 06/26/2019 0457   Sepsis Labs Invalid input(s): PROCALCITONIN,  WBC,  LACTICIDVEN Microbiology Recent Results (from the past 240 hour(s))  Respiratory Panel by RT PCR (Flu A&B, Covid) - Nasopharyngeal Swab     Status: None   Collection Time: 02/12/20  7:20 PM   Specimen: Nasopharyngeal Swab  Result Value Ref Range Status   SARS Coronavirus 2 by RT PCR NEGATIVE NEGATIVE Final    Comment: (NOTE) SARS-CoV-2 target nucleic acids are NOT DETECTED.  The SARS-CoV-2 RNA is generally detectable in upper respiratoy specimens during the acute phase of infection. The lowest concentration of SARS-CoV-2 viral copies this assay can detect is 131 copies/mL. A negative result does not preclude SARS-Cov-2 infection and should not be used as the sole basis for treatment or other patient management decisions. A negative result may occur with  improper specimen collection/handling, submission of specimen other than nasopharyngeal swab, presence of viral mutation(s) within the areas targeted by this  assay, and inadequate number of viral copies (<131 copies/mL). A negative result must be combined with clinical observations, patient history, and epidemiological information. The expected result is Negative.  Fact Sheet for Patients:  https://www.moore.com/  Fact Sheet for Healthcare Providers:  https://www.young.biz/  This test is no t yet approved or cleared by the [place] FDA and  has been authorized for detection and/or diagnosis of SARS-CoV-2 by FDA under an Emergency Use Authorization (EUA). This EUA will remain  in effect (meaning this test can be used) for the duration of the COVID-19 declaration under Section 564(b)(1) of the Act, 21 U.S.C. section 360bbb-3(b)(1), unless the authorization is terminated or revoked sooner.     Influenza A by PCR NEGATIVE NEGATIVE Final   Influenza B by PCR NEGATIVE NEGATIVE Final    Comment: (NOTE) The Xpert Xpress SARS-CoV-2/FLU/RSV assay is intended as an aid in  the diagnosis of influenza from Nasopharyngeal swab specimens and  should not be used as a sole basis for treatment. Nasal washings and  aspirates are unacceptable for Xpert Xpress SARS-CoV-2/FLU/RSV  testing.  Fact Sheet for Patients: https://www.moore.com/  Fact Sheet for Healthcare Providers: https://www.young.biz/  This test is not yet approved or cleared by the [place] FDA and  has been authorized for detection and/or diagnosis of SARS-CoV-2 by  FDA under an Emergency Use Authorization (EUA). This EUA will remain  in effect (meaning this test can be used) for the duration of the  Covid-19 declaration under Section 564(b)(1) of the Act, 21  U.S.C. section 360bbb-3(b)(1), unless the authorization is  terminated or revoked. Performed at [facility], [address] [place], [place] [address]      Time coordinating discharge: 35 minutes  SIGNED:   Glade Lloyd, MD  [facility] 02/16/2020, 11:03 AM

## 2020-02-16 NOTE — Progress Notes (Signed)
Progress Note  Patient Name: [name] Date of Encounter: 02/16/2020  [facility] Cardiologist: Olga Millers, MD   Subjective   Feels great eating breakfast Ready for d/c   Inpatient Medications    Scheduled Meds: . amoxicillin  500 mg Oral Q12H  . atorvastatin  40 mg Oral Daily  . dabigatran  150 mg Oral BID  . diltiazem  60 mg Oral Q6H  . insulin aspart  0-15 Units Subcutaneous Q4H  . insulin glargine  8 Units Subcutaneous BID  . latanoprost  1 drop Both Eyes QHS  . lisinopril  10 mg Oral Daily  . metoprolol tartrate  50 mg Oral BID  . omega-3 acid ethyl esters  1 g Oral Daily   Continuous Infusions:  PRN Meds: acetaminophen, ondansetron (ZOFRAN) IV   Vital Signs    Vitals:   02/15/20 1552 02/15/20 2051 02/16/20 0001 02/16/20 0408  BP: 128/60 123/65 108/62 125/68  Pulse: 71 76 74 65  Resp:  18  18  Temp: (!) 97.5 F (36.4 C) 98.1 F (36.7 C)  97.9 F (36.6 C)  TempSrc: Oral Oral  Oral  SpO2: 96% 98%  100%  Weight:      Height:        Intake/Output Summary (Last 24 hours) at 02/16/2020 0833 Last data filed at 02/15/2020 1900 Gross per 24 hour  Intake 540 ml  Output --  Net 540 ml   Last 3 Weights 02/12/2020 06/26/2019 04/22/2019  Weight (lbs) 106 lb 97 lb 95 lb  Weight (kg) 48.081 kg 43.999 kg 43.092 kg      Telemetry    NSR rate 70's 02/16/2020   ECG    Rate controlled atrial flutter. No acute ischemic changes. - Personally Reviewed  Physical Exam   GEN: No acute distress.   Neck: No JVD Cardiac: Irregular with normal rate. No murmurs, rubs, or gallops.  Respiratory: Clear to auscultation bilaterally. GI: Soft, non-tender, non-distended  MS: No lower extremity edema. No deformity. Neuro:  No focal deficits. Psych: Normal affect. Responds appropriately.  Labs    High Sensitivity Troponin:  No results for input(s): TROPONINIHS in the last 720 hours.    Chemistry Recent Labs  Lab 02/12/20 1401 02/13/20 1251 02/14/20 0434  02/15/20 0448 02/16/20 0513  NA 139   < > 138 134* 139  K 3.9   < > 3.8 3.5 4.5  CL 105   < > 102 102 108  CO2 23   < > 24 21* 21*  GLUCOSE 262*   < > 207* 260* 220*  BUN 19   < > 18 32* 27*  CREATININE 0.82   < > 0.85 1.08* 0.89  CALCIUM 9.5   < > 9.7 9.1 9.0  PROT 7.8  --   --   --   --   ALBUMIN 4.0  --   --   --   --   AST 32  --   --   --   --   ALT 33  --   --   --   --   ALKPHOS 66  --   --   --   --   BILITOT 0.6  --   --   --   --   GFRNONAA >60   < > >60 50* >60  ANIONGAP 11   < > 12 11 10    < > = values in this interval not displayed.     Hematology Recent Labs  Lab 02/14/20 0434 02/15/20 0448 02/16/20 0513  WBC 7.2 5.7 5.3  RBC 3.39* 3.11* 2.99*  HGB 10.7* 9.7* 9.5*  HCT 31.9* 29.6* 29.1*  MCV 94.1 95.2 97.3  MCH 31.6 31.2 31.8  MCHC 33.5 32.8 32.6  RDW 15.8* 16.1* 16.4*  PLT 215 184 197    BNP Recent Labs  Lab 02/13/20 1251  BNP 222.4*     DDimer No results for input(s): DDIMER in the last 168 hours.   Radiology    ECHOCARDIOGRAM COMPLETE  Result Date: 02/14/2020    ECHOCARDIOGRAM REPORT   Patient Name:   [name] Date of Exam: 02/14/2020 Medical Rec #:  [id number]        Height:       59.0 in Accession #:    [id number]       Weight:       106.0 lb Date of Birth:  [birth year]       BSA:          1.408 m Patient Age:    87 years         BP:           119/87 mmHg Patient Gender: F                HR:           108 bpm. Exam Location:  Inpatient Procedure: 2D Echo, Cardiac Doppler and Color Doppler Indications:    Atrial Flutter 427.32 / I48.92  History:        Patient has prior history of Echocardiogram examinations, most                 recent 10/28/2007. Stroke, Arrythmias:Atrial Fibrillation; Risk                 Factors:Hypertension, Diabetes, Dyslipidemia and Non-Smoker.                 PAD.  Sonographer:    Renella Cunas RDCS Referring Phys: [id number] JARED E SEGAL IMPRESSIONS  1. Left ventricular ejection fraction, by estimation, is 50 to 55%. The  left ventricle has low normal function. The left ventricle has no regional wall motion abnormalities. Indeterminate diastolic filling due to E-A fusion.  2. Right ventricular systolic function is mildly reduced. The right ventricular size is normal. There is normal pulmonary artery systolic pressure.  3. The mitral valve is normal in structure. Trivial mitral valve regurgitation.  4. The aortic valve was not well visualized. Aortic valve regurgitation is not visualized. Comparison(s): Compared to prior report in 2009, the patient is now in Afib with RVR with LVEF 50-55%. FINDINGS  Left Ventricle: Left ventricular ejection fraction, by estimation, is 50 to 55%. The left ventricle has low normal function. The left ventricle has no regional wall motion abnormalities. The left ventricular internal cavity size was normal in size. There is no left ventricular hypertrophy. Indeterminate diastolic filling due to E-A fusion. Right Ventricle: The right ventricular size is normal. Right vetricular wall thickness was not well visualized. Right ventricular systolic function is mildly reduced. There is normal pulmonary artery systolic pressure. The tricuspid regurgitant velocity is 2.11 m/s, and with an assumed right atrial pressure of 3 mmHg, the estimated right ventricular systolic pressure is 20.8 mmHg. Left Atrium: Left atrial size was normal in size. Right Atrium: Right atrial size was normal in size. Pericardium: There is no evidence of pericardial effusion. Mitral Valve: The mitral valve is normal in structure. There is mild thickening of  the mitral valve leaflet(s). There is mild calcification of the mitral valve leaflet(s). Mild mitral annular calcification. Trivial mitral valve regurgitation. Tricuspid Valve: The tricuspid valve is normal in structure. Tricuspid valve regurgitation is trivial. Aortic Valve: The aortic valve was not well visualized. Aortic valve regurgitation is not visualized. Pulmonic Valve: The  pulmonic valve was normal in structure. Pulmonic valve regurgitation is not visualized. Aorta: The aortic root is normal in size and structure. IAS/Shunts: No atrial level shunt detected by color flow Doppler.  LEFT VENTRICLE PLAX 2D LVIDd:         3.30 cm LVIDs:         3.00 cm LV PW:         0.80 cm LV IVS:        0.80 cm LVOT diam:     1.60 cm LV SV:         20 LV SV Index:   14 LVOT Area:     2.01 cm  LV Volumes (MOD) LV vol d, MOD A4C: 30.2 ml LV vol s, MOD A4C: 18.1 ml LV SV MOD A4C:     30.2 ml RIGHT VENTRICLE TAPSE (M-mode): 1.0 cm LEFT ATRIUM             Index       RIGHT ATRIUM           Index LA diam:        3.60 cm 2.56 cm/m  RA Area:     11.90 cm LA Vol (A2C):   24.9 ml 17.68 ml/m RA Volume:   23.40 ml  16.62 ml/m LA Vol (A4C):   15.2 ml 10.79 ml/m LA Biplane Vol: 20.8 ml 14.77 ml/m  AORTIC VALVE LVOT Vmax:   61.10 cm/s LVOT Vmean:  44.700 cm/s LVOT VTI:    0.099 m  AORTA Ao Root diam: 2.40 cm TRICUSPID VALVE TR Peak grad:   17.8 mmHg TR Vmax:        211.00 cm/s  SHUNTS Systemic VTI:  0.10 m Systemic Diam: 1.60 cm Laurance Flatten MD Electronically signed by Laurance Flatten MD Signature Date/Time: 02/14/2020/2:31:41 PM    Final     Cardiac Studies   Echocardiogram 02/14/2020: 1. Left ventricular ejection fraction, by estimation, is 50 to 55%. The  left ventricle has low normal function. The left ventricle has no regional  wall motion abnormalities. Indeterminate diastolic filling due to E-A  fusion.   2. Right ventricular systolic function is mildly reduced. The right  ventricular size is normal. There is normal pulmonary artery systolic  pressure.   3. The mitral valve is normal in structure. Trivial mitral valve  regurgitation.   4. The aortic valve was not well visualized. Aortic valve regurgitation  is not visualized.   Comparison(s): Compared to prior report in 2009, the patient is now in  Afib with RVR with LVEF 50-55%.   Patient Profile     86 y.o. female with a  history of paroxysmal atrial fibrillation on Pradaxa, PAD s/p left above knee bypass in 2012, CVA in 1999, hypertension, hyperlipidemia, type 2 diabetes mellitus, and prior DVT who is being seen today for evaluation of atrial flutter with RVR at the request of Dr. Donnald Garre.  Assessment & Plan    Atrial Flutter with RVR History of Atrial Fibrillation  - post [facility] yesterday with restoration of NSR  - continue current meds and pradaxa  Hypertension - BP soft at times but mostly well controlled. - Continue Lopressor and Cardizem  as above. - OK to continue Lisinopril 10mg  daily as well.   Hyperlipidemia - Lipid panel this admission: Total Cholesterol 120, Triglycerides 60, HDL 46, LDL 62. - Continue Lipitor 40mg  daily.  Chronic Anemia - Hemoglobin 99.5  - Management per primary team.  Will arrange outpatient f/u with Dr  For questions or updates, please contact [facility] Please consult [email or web address] for contact info under        Signed, , MD  02/16/2020, 8:33 AM    Patient examined chart reviewed Telemetry shows continued flutter Patient confirms no missed doses of Pradaxa. For cardioversion at [facility] Today 2:00 with Dr 13/06/2019  Further w/u anemia per primary team. Possible d/c in am if [facility] successful  Elease Hashimoto MD [facility]

## 2020-02-16 NOTE — TOC Transition Note (Signed)
Transition of Care [facility]) - CM/SW Discharge Note   Patient Details  Name: [name] MRN: [id number] Date of Birth: 1932/12/18  Transition of Care [facility]) CM/SW Contact:  Lanier Clam, RN Phone Number: 02/16/2020, 9:43 AM   Clinical Narrative: d/c today home w/[facility] has already brought quad cane to rm. No further CM needs.     Final next level of care: Home w Home Health Services Barriers to Discharge: No Barriers Identified   Patient Goals and CMS Choice Patient states their goals for this hospitalization and ongoing recovery are:: go home CMS Medicare.gov Compare Post Acute Care list provided to:: Patient Represenative (must comment) Choice offered to / list presented to : Adult Children  Discharge Placement                       Discharge Plan and Services   Discharge Planning Services: CM Consult Post Acute Care Choice: Durable Medical Equipment, Home Health          DME Arranged: Cane (Isla Pence cane) DME Agency: [employer] Date DME Agency Contacted: 02/15/20 Time DME Agency Contacted: [phone] Representative spoke with at DME Agency: Francesco Sor Arranged: PT HH Agency: [facility] Date [facility] Agency Contacted: 02/15/20 Time HH Agency Contacted: 1503 Representative spoke with at [facility] Agency: Kandee Keen  Social Determinants of Health (SDOH) Interventions     Readmission Risk Interventions No flowsheet data found.

## 2020-02-17 ENCOUNTER — Encounter (HOSPITAL_COMMUNITY): Payer: Self-pay | Admitting: Cardiovascular Disease

## 2020-02-17 ENCOUNTER — Other Ambulatory Visit: Payer: Self-pay

## 2020-02-17 DIAGNOSIS — I10 Essential (primary) hypertension: Secondary | ICD-10-CM | POA: Diagnosis not present

## 2020-02-17 DIAGNOSIS — Z7984 Long term (current) use of oral hypoglycemic drugs: Secondary | ICD-10-CM | POA: Diagnosis not present

## 2020-02-17 DIAGNOSIS — E1151 Type 2 diabetes mellitus with diabetic peripheral angiopathy without gangrene: Secondary | ICD-10-CM | POA: Diagnosis not present

## 2020-02-17 DIAGNOSIS — I480 Paroxysmal atrial fibrillation: Secondary | ICD-10-CM | POA: Diagnosis not present

## 2020-02-17 DIAGNOSIS — Z7901 Long term (current) use of anticoagulants: Secondary | ICD-10-CM | POA: Diagnosis not present

## 2020-02-17 DIAGNOSIS — Z9181 History of falling: Secondary | ICD-10-CM | POA: Diagnosis not present

## 2020-02-17 DIAGNOSIS — E1165 Type 2 diabetes mellitus with hyperglycemia: Secondary | ICD-10-CM | POA: Diagnosis not present

## 2020-02-17 DIAGNOSIS — I4892 Unspecified atrial flutter: Secondary | ICD-10-CM | POA: Diagnosis not present

## 2020-02-17 DIAGNOSIS — E785 Hyperlipidemia, unspecified: Secondary | ICD-10-CM | POA: Diagnosis not present

## 2020-02-17 NOTE — Patient Outreach (Signed)
[facility]) Care Management  [facility] Care Manager  02/17/2020   [name] [birth year] [id number]  Subjective: Telephone call to patient for hospital follow up. Patient admitted for Atrial Flutter.  Patient reports she feels good. Reviewed medications with patient.  No questions. Provided A. Flutter education.  Patient bp 112/52 and heart rate 68.  Blood sugar 165 this am.  Patient to have home health evaluation. Patient has support from family but lives alone. Patient independent with managing all aspects of care at this time. Patient to call Dr. Maxie Better for follow up. Patient has appointment with cardiology made already. No concerns.      Objective:   Encounter Medications:  Outpatient Encounter Medications as of 02/17/2020  Medication Sig  . ACCU-CHEK SOFTCLIX LANCETS lancets   . amoxicillin (AMOXIL) 500 MG capsule Take 500 mg by mouth 3 (three) times daily. Start date : 02/09/20  . atorvastatin (LIPITOR) 40 MG tablet TAKE 1 TABLET BY MOUTH ONCE A DAY (Patient taking differently: Take 40 mg by mouth daily. )  . cholecalciferol (VITAMIN D3) 25 MCG (1000 UNIT) tablet Take 1,000 Units by mouth daily.  . dabigatran (PRADAXA) 150 MG CAPS capsule Take 1 capsule (150 mg total) by mouth 2 (two) times daily.  Marland Kitchen diltiazem (CARDIZEM) 60 MG tablet Take 1 tablet (60 mg total) by mouth every 6 (six) hours.  . fish oil-omega-3 fatty acids 1000 MG capsule Take 1 g by mouth daily.    Marland Kitchen glipiZIDE (GLUCOTROL) 10 MG tablet Take 10 mg by mouth 2 (two) times daily before a meal.    . latanoprost (XALATAN) 0.005 % ophthalmic solution Place 1 drop into both eyes at bedtime.  Marland Kitchen linagliptin (TRADJENTA) 5 MG TABS tablet Take 1 tablet (5 mg total) by mouth daily.  Marland Kitchen lisinopril (PRINIVIL,ZESTRIL) 10 MG tablet TAKE 1 TABLET DAILY (Patient taking differently: Take 10 mg by mouth daily. )  . metFORMIN (GLUCOPHAGE) 500 MG tablet Take 1,000 mg by mouth 2 (two) times daily with a meal.   . metoprolol  tartrate (LOPRESSOR) 50 MG tablet Take 1 tablet (50 mg total) by mouth 2 (two) times daily.  . TRUETEST TEST test strip   . [DISCONTINUED] iron polysaccharides (NIFEREX) 150 MG capsule Take 150 mg by mouth daily.    . [DISCONTINUED] topiramate (TOPAMAX) 15 MG capsule Take 15 mg by mouth 2 (two) times daily.     No facility-administered encounter medications on file as of 02/17/2020.    Functional Status:  In your present state of health, do you have any difficulty performing the following activities: 02/17/2020 02/14/2020  Hearing? N -  Vision? N -  Difficulty concentrating or making decisions? N -  Walking or climbing stairs? N -  Dressing or bathing? N -  Doing errands, shopping? 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 N -  Using the Toilet? N -  In the past six months, have you accidently leaked urine? N -  Do you have problems with loss of bowel control? N -  Managing your Medications? N -  Managing your Finances? N -  Housekeeping or managing your Housekeeping? - -  Some recent data might be hidden    Fall/Depression Screening: Fall Risk  02/17/2020 01/31/2020 08/30/2019  Falls in the past year? 0 0 0  Number falls in past yr: - - -  Injury with Fall? - - -   PHQ 2/9 Scores 02/17/2020 01/31/2020 08/30/2019 05/07/2019 05/03/2019  PHQ - 2 Score 0 0 0  0 0    Assessment: Patient with hospitalization from 10-30 to 11-3 for A. Flutter. Patient did have cardioversion done.   Goals Addressed            This Visit's Progress   . Monitor and Manage My Blood Sugar   On track    Follow Up Date 05/14/20   - check blood sugar at prescribed times - check blood sugar if I feel it is too high or too low - enter blood sugar readings and medication or insulin into daily log - take the blood sugar log to all doctor visits    Why is this important?   Checking your blood sugar at home helps to keep it from getting very high or very low.  Writing the results in a diary or log helps the doctor know  how to care for you.  Your blood sugar log should have the time, date and the results.  Also, write down the amount of insulin or other medicine that you take.  Other information, like what you ate, exercise done and how you were feeling, will also be helpful.     Notes:     . Track and Manage Heart Rate and Rhythm   On track    Follow Up Date 03/14/21   - check pulse (heart) rate once a day - make a plan to exercise regularly - make a plan to eat healthy - take medicine as prescribed    Why is this important?   Atrial fibrillation may have no symptoms. Sometimes the symptoms get worse or happen more often.  It is important to keep track of what your symptoms are and when they happen.  A change in symptoms is important to discuss with your doctor or nurse.  Being active and healthy eating will also help you manage your heart condition.     Notes:        Plan: RN CM will send new welcome letter to patient and barriers letter to physician.   RN CM will contact patient again in the month of November and patient agreeable.    [name], RN, MSN [facility] Care Management Care Management Coordinator Direct Line [phone] Cell [phone] Toll Free: [phone]  Fax: [phone]

## 2020-02-17 NOTE — Patient Outreach (Signed)
[facility]) Care Management  02/17/2020  [name] 01-Jan-1933 [id number]   Telephone call back to patient.  HIPAA compliant voice message left.    Plan: RN CM will wait return call.  [name], RN, MSN [facility] Care Management Care Management Coordinator Direct Line [phone] Cell [phone] Toll Free: [phone]  Fax: [phone]

## 2020-02-17 NOTE — Patient Outreach (Signed)
[facility]) Care Management  02/17/2020  [name] 11-Jul-1932 [id number]   Patient discharge from hospital on 02-16-20 diagnosis Atrial Flutter.  Telephone call to patient for follow up. No answer.  HIPAA compliant voice message left.    Plan: RN CM will attempt again within 4 business days.    [name], RN, MSN [facility] Care Management Care Management Coordinator Direct Line [phone] Cell [phone] Toll Free: [phone]  Fax: [phone]

## 2020-02-21 ENCOUNTER — Ambulatory Visit: Payer: Medicare HMO

## 2020-02-22 DIAGNOSIS — I10 Essential (primary) hypertension: Secondary | ICD-10-CM | POA: Diagnosis not present

## 2020-02-22 DIAGNOSIS — I739 Peripheral vascular disease, unspecified: Secondary | ICD-10-CM | POA: Diagnosis not present

## 2020-02-22 DIAGNOSIS — I4891 Unspecified atrial fibrillation: Secondary | ICD-10-CM | POA: Diagnosis not present

## 2020-02-22 DIAGNOSIS — E1169 Type 2 diabetes mellitus with other specified complication: Secondary | ICD-10-CM | POA: Diagnosis not present

## 2020-02-22 DIAGNOSIS — E782 Mixed hyperlipidemia: Secondary | ICD-10-CM | POA: Diagnosis not present

## 2020-02-23 ENCOUNTER — Ambulatory Visit: Payer: Self-pay

## 2020-02-23 DIAGNOSIS — R6889 Other general symptoms and signs: Secondary | ICD-10-CM | POA: Diagnosis not present

## 2020-02-24 DIAGNOSIS — E1165 Type 2 diabetes mellitus with hyperglycemia: Secondary | ICD-10-CM | POA: Diagnosis not present

## 2020-02-24 DIAGNOSIS — E785 Hyperlipidemia, unspecified: Secondary | ICD-10-CM | POA: Diagnosis not present

## 2020-02-24 DIAGNOSIS — Z7984 Long term (current) use of oral hypoglycemic drugs: Secondary | ICD-10-CM | POA: Diagnosis not present

## 2020-02-24 DIAGNOSIS — E1151 Type 2 diabetes mellitus with diabetic peripheral angiopathy without gangrene: Secondary | ICD-10-CM | POA: Diagnosis not present

## 2020-02-24 DIAGNOSIS — I4892 Unspecified atrial flutter: Secondary | ICD-10-CM | POA: Diagnosis not present

## 2020-02-24 DIAGNOSIS — Z7901 Long term (current) use of anticoagulants: Secondary | ICD-10-CM | POA: Diagnosis not present

## 2020-02-24 DIAGNOSIS — I10 Essential (primary) hypertension: Secondary | ICD-10-CM | POA: Diagnosis not present

## 2020-02-24 DIAGNOSIS — Z9181 History of falling: Secondary | ICD-10-CM | POA: Diagnosis not present

## 2020-02-24 DIAGNOSIS — I480 Paroxysmal atrial fibrillation: Secondary | ICD-10-CM | POA: Diagnosis not present

## 2020-02-28 ENCOUNTER — Other Ambulatory Visit: Payer: Self-pay

## 2020-02-28 NOTE — Patient Outreach (Signed)
[facility]) Care Management  02/28/2020  [name] 1933/03/18 [id number]   Telephone call to patient for follow up nurse line call.  No answer.  Unable to leave a message.    Plan: RN CM will wait return call.  If no return call will attempt again within the next 4 business days.    [name], RN, MSN [facility] Care Management Care Management Coordinator Direct Line [phone] Cell [phone] Toll Free: [phone]  Fax: [phone]

## 2020-02-29 DIAGNOSIS — E1151 Type 2 diabetes mellitus with diabetic peripheral angiopathy without gangrene: Secondary | ICD-10-CM | POA: Diagnosis not present

## 2020-02-29 DIAGNOSIS — E785 Hyperlipidemia, unspecified: Secondary | ICD-10-CM | POA: Diagnosis not present

## 2020-02-29 DIAGNOSIS — I4892 Unspecified atrial flutter: Secondary | ICD-10-CM | POA: Diagnosis not present

## 2020-02-29 DIAGNOSIS — I480 Paroxysmal atrial fibrillation: Secondary | ICD-10-CM | POA: Diagnosis not present

## 2020-02-29 DIAGNOSIS — Z7901 Long term (current) use of anticoagulants: Secondary | ICD-10-CM | POA: Diagnosis not present

## 2020-02-29 DIAGNOSIS — Z7984 Long term (current) use of oral hypoglycemic drugs: Secondary | ICD-10-CM | POA: Diagnosis not present

## 2020-02-29 DIAGNOSIS — I10 Essential (primary) hypertension: Secondary | ICD-10-CM | POA: Diagnosis not present

## 2020-02-29 DIAGNOSIS — E1165 Type 2 diabetes mellitus with hyperglycemia: Secondary | ICD-10-CM | POA: Diagnosis not present

## 2020-02-29 DIAGNOSIS — Z9181 History of falling: Secondary | ICD-10-CM | POA: Diagnosis not present

## 2020-03-01 ENCOUNTER — Other Ambulatory Visit: Payer: Self-pay

## 2020-03-01 NOTE — Patient Outreach (Signed)
[facility]) Care Management  03/01/2020  [name] 07/31/1932 [id number]   Telephone call to patient for follow up. No answer.  Unable to leave a message.   Plan: RN CM will attempt patient again in the month of November.    [name], RN, MSN [facility] Care Management Care Management Coordinator Direct Line [phone] Cell [phone] Toll Free: [phone]  Fax: [phone]

## 2020-03-06 ENCOUNTER — Other Ambulatory Visit: Payer: Self-pay

## 2020-03-06 NOTE — Patient Outreach (Signed)
[facility]) Care Management  03/06/2020  [name] [birth year] [id number]   Telephone call to patient.  No answer. HIPAA compliant voice message left.  Called to son- wrong number. Called to [facility] no answer.  HIPAA compliant voice message left.   Plan: RN CM will wait return call, if no return call will attempt again in the month of December.    [name], RN, MSN [facility] Care Management Care Management Coordinator Direct Line [phone] Cell [phone] Toll Free: [phone]  Fax: [phone]

## 2020-03-06 NOTE — Patient Outreach (Signed)
[facility]) Care Management  [facility] Care Manager  03/06/2020   [name] January 19, 1933 [id number]  Subjective: Incoming call from patient.  She reports she is doing good.  She reports no problems with increased heart rate.  Discussed her calling the nurse line about her medication.  She reports that her refill medication looked different than before.  She states she found out it was a Scientist, forensic but the same medication. Discussed with patient signs of A. Fib./ Flutter.  She verbalized understanding.  Patient reports that she is concerned as her blood sugar has been higher than normal and her diet is the same.  She states she sees her PCP on tomorrow and will discuss with her. She states her sugar this am was 287.  Discussed diabetes and importance of control. She verbalized understanding and voices no concerns.     Objective:   Encounter Medications:  Outpatient Encounter Medications as of 03/06/2020  Medication Sig  . ACCU-CHEK SOFTCLIX LANCETS lancets   . amoxicillin (AMOXIL) 500 MG capsule Take 500 mg by mouth 3 (three) times daily. Start date : 02/09/20  . atorvastatin (LIPITOR) 40 MG tablet TAKE 1 TABLET BY MOUTH ONCE A DAY (Patient taking differently: Take 40 mg by mouth daily. )  . cholecalciferol (VITAMIN D3) 25 MCG (1000 UNIT) tablet Take 1,000 Units by mouth daily.  . dabigatran (PRADAXA) 150 MG CAPS capsule Take 1 capsule (150 mg total) by mouth 2 (two) times daily.  Marland Kitchen diltiazem (CARDIZEM) 60 MG tablet Take 1 tablet (60 mg total) by mouth every 6 (six) hours.  . fish oil-omega-3 fatty acids 1000 MG capsule Take 1 g by mouth daily.    Marland Kitchen glipiZIDE (GLUCOTROL) 10 MG tablet Take 10 mg by mouth 2 (two) times daily before a meal.    . latanoprost (XALATAN) 0.005 % ophthalmic solution Place 1 drop into both eyes at bedtime.  Marland Kitchen linagliptin (TRADJENTA) 5 MG TABS tablet Take 1 tablet (5 mg total) by mouth daily.  Marland Kitchen lisinopril (PRINIVIL,ZESTRIL) 10 MG tablet TAKE  1 TABLET DAILY (Patient taking differently: Take 10 mg by mouth daily. )  . metFORMIN (GLUCOPHAGE) 500 MG tablet Take 1,000 mg by mouth 2 (two) times daily with a meal.   . metoprolol tartrate (LOPRESSOR) 50 MG tablet Take 1 tablet (50 mg total) by mouth 2 (two) times daily.  . TRUETEST TEST test strip   . [DISCONTINUED] iron polysaccharides (NIFEREX) 150 MG capsule Take 150 mg by mouth daily.    . [DISCONTINUED] topiramate (TOPAMAX) 15 MG capsule Take 15 mg by mouth 2 (two) times daily.     No facility-administered encounter medications on file as of 03/06/2020.    Functional Status:  In your present state of health, do you have any difficulty performing the following activities: 02/17/2020 02/14/2020  Hearing? N -  Vision? N -  Difficulty concentrating or making decisions? N -  Walking or climbing stairs? N -  Dressing or bathing? N -  Doing errands, shopping? 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 N -  Using the Toilet? N -  In the past six months, have you accidently leaked urine? N -  Do you have problems with loss of bowel control? N -  Managing your Medications? N -  Managing your Finances? N -  Housekeeping or managing your Housekeeping? - -  Some recent data might be hidden    Fall/Depression Screening: Fall Risk  02/17/2020 01/31/2020 08/30/2019  Falls in the past  year? 0 0 0  Number falls in past yr: - - -  Injury with Fall? - - -   PHQ 2/9 Scores 02/17/2020 01/31/2020 08/30/2019 05/07/2019 05/03/2019  PHQ - 2 Score 0 0 0 0 0    Assessment: Patient continues hospital recovery and managing chronic conditions.   Goals Addressed            This Visit's Progress   . Monitor and Manage My Blood Sugar   On track    Follow Up Date 05/14/20   - check blood sugar at prescribed times - check blood sugar if I feel it is too high or too low - enter blood sugar readings and medication or insulin into daily log - take the blood sugar log to all doctor visits    Why is this  important?   Checking your blood sugar at home helps to keep it from getting very high or very low.  Writing the results in a diary or log helps the doctor know how to care for you.  Your blood sugar log should have the time, date and the results.  Also, write down the amount of insulin or other medicine that you take.  Other information, like what you ate, exercise done and how you were feeling, will also be helpful.     Notes:     . Track and Manage Heart Rate and Rhythm   On track    Follow Up Date 05/15/20   - check pulse (heart) rate once a day - make a plan to exercise regularly - make a plan to eat healthy - take medicine as prescribed    Why is this important?   Atrial fibrillation may have no symptoms. Sometimes the symptoms get worse or happen more often.  It is important to keep track of what your symptoms are and when they happen.  A change in symptoms is important to discuss with your doctor or nurse.  Being active and healthy eating will also help you manage your heart condition.     Notes:        Plan: RN CM will contact patient again in the month of December and patient agreeable.    [name], RN, MSN [facility] Care Management Care Management Coordinator Direct Line [phone] Cell [phone] Toll Free: [phone]  Fax: [phone]

## 2020-03-07 DIAGNOSIS — I739 Peripheral vascular disease, unspecified: Secondary | ICD-10-CM | POA: Diagnosis not present

## 2020-03-07 DIAGNOSIS — I10 Essential (primary) hypertension: Secondary | ICD-10-CM | POA: Diagnosis not present

## 2020-03-07 DIAGNOSIS — I480 Paroxysmal atrial fibrillation: Secondary | ICD-10-CM | POA: Diagnosis not present

## 2020-03-07 DIAGNOSIS — E1169 Type 2 diabetes mellitus with other specified complication: Secondary | ICD-10-CM | POA: Diagnosis not present

## 2020-03-08 ENCOUNTER — Other Ambulatory Visit: Payer: Self-pay

## 2020-03-08 NOTE — Patient Outreach (Signed)
[facility]) Care Management  03/08/2020  [name] Oct 17, 1932 [id number]   Telephone call to patient for follow up concerning Cardizem change from MD visit.  No answer.  HIPAA compliant voice message left.  Plan: RN CM will wait return call, if no return call CM will call again within 4 business days.

## 2020-03-10 DIAGNOSIS — E1165 Type 2 diabetes mellitus with hyperglycemia: Secondary | ICD-10-CM | POA: Diagnosis not present

## 2020-03-10 DIAGNOSIS — E1151 Type 2 diabetes mellitus with diabetic peripheral angiopathy without gangrene: Secondary | ICD-10-CM | POA: Diagnosis not present

## 2020-03-10 DIAGNOSIS — Z7984 Long term (current) use of oral hypoglycemic drugs: Secondary | ICD-10-CM | POA: Diagnosis not present

## 2020-03-10 DIAGNOSIS — I10 Essential (primary) hypertension: Secondary | ICD-10-CM | POA: Diagnosis not present

## 2020-03-10 DIAGNOSIS — I480 Paroxysmal atrial fibrillation: Secondary | ICD-10-CM | POA: Diagnosis not present

## 2020-03-10 DIAGNOSIS — E785 Hyperlipidemia, unspecified: Secondary | ICD-10-CM | POA: Diagnosis not present

## 2020-03-10 DIAGNOSIS — Z7901 Long term (current) use of anticoagulants: Secondary | ICD-10-CM | POA: Diagnosis not present

## 2020-03-10 DIAGNOSIS — Z9181 History of falling: Secondary | ICD-10-CM | POA: Diagnosis not present

## 2020-03-10 DIAGNOSIS — I4892 Unspecified atrial flutter: Secondary | ICD-10-CM | POA: Diagnosis not present

## 2020-03-16 ENCOUNTER — Other Ambulatory Visit: Payer: Self-pay

## 2020-03-16 NOTE — Patient Outreach (Signed)
[facility]) Care Management  03/16/2020  ANNALI LYBRAND 09-Nov-1932 [id number]   Telephone call to patient for follow up. No answer. HIPAA compliant voice message left.    Plan: RN CM will attempt patient again within the next 4 business days and send letter.   [name], RN, MSN [facility] Care Management Care Management Coordinator Direct Line [phone] Cell [phone] Toll Free: [phone]  Fax: [phone]

## 2020-03-16 NOTE — Patient Outreach (Signed)
[facility]) Care Management  [facility] Care Manager  03/16/2020   [name] [birth year] [id number]  Subjective: Telephone call to patient to follow up on Cardizem question. Patient reports that she was told to continue to take her medication she has and when her new medications come to start those.  She states she feels fine and that her sugars have come down a lot. This mornings sugar was 181.  Advised patient to continue her diet and medications. She verbalized understanding.    Objective:   Encounter Medications:  Outpatient Encounter Medications as of 03/16/2020  Medication Sig  . ACCU-CHEK SOFTCLIX LANCETS lancets   . amoxicillin (AMOXIL) 500 MG capsule Take 500 mg by mouth 3 (three) times daily. Start date : 02/09/20  . atorvastatin (LIPITOR) 40 MG tablet TAKE 1 TABLET BY MOUTH ONCE A DAY (Patient taking differently: Take 40 mg by mouth daily. )  . cholecalciferol (VITAMIN D3) 25 MCG (1000 UNIT) tablet Take 1,000 Units by mouth daily.  . dabigatran (PRADAXA) 150 MG CAPS capsule Take 1 capsule (150 mg total) by mouth 2 (two) times daily.  Marland Kitchen diltiazem (CARDIZEM) 60 MG tablet Take 1 tablet (60 mg total) by mouth every 6 (six) hours.  . fish oil-omega-3 fatty acids 1000 MG capsule Take 1 g by mouth daily.    Marland Kitchen glipiZIDE (GLUCOTROL) 10 MG tablet Take 10 mg by mouth 2 (two) times daily before a meal.    . latanoprost (XALATAN) 0.005 % ophthalmic solution Place 1 drop into both eyes at bedtime.  Marland Kitchen linagliptin (TRADJENTA) 5 MG TABS tablet Take 1 tablet (5 mg total) by mouth daily.  Marland Kitchen lisinopril (PRINIVIL,ZESTRIL) 10 MG tablet TAKE 1 TABLET DAILY (Patient taking differently: Take 10 mg by mouth daily. )  . metFORMIN (GLUCOPHAGE) 500 MG tablet Take 1,000 mg by mouth 2 (two) times daily with a meal.   . metoprolol tartrate (LOPRESSOR) 50 MG tablet Take 1 tablet (50 mg total) by mouth 2 (two) times daily.  . TRUETEST TEST test strip   . [DISCONTINUED] iron polysaccharides  (NIFEREX) 150 MG capsule Take 150 mg by mouth daily.    . [DISCONTINUED] topiramate (TOPAMAX) 15 MG capsule Take 15 mg by mouth 2 (two) times daily.     No facility-administered encounter medications on file as of 03/16/2020.    Functional Status:  In your present state of health, do you have any difficulty performing the following activities: 02/17/2020 02/14/2020  Hearing? N -  Vision? N -  Difficulty concentrating or making decisions? N -  Walking or climbing stairs? N -  Dressing or bathing? N -  Doing errands, shopping? 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 N -  Using the Toilet? N -  In the past six months, have you accidently leaked urine? N -  Do you have problems with loss of bowel control? N -  Managing your Medications? N -  Managing your Finances? N -  Housekeeping or managing your Housekeeping? - -  Some recent data might be hidden    Fall/Depression Screening: Fall Risk  02/17/2020 01/31/2020 08/30/2019  Falls in the past year? 0 0 0  Number falls in past yr: - - -  Injury with Fall? - - -   PHQ 2/9 Scores 02/17/2020 01/31/2020 08/30/2019 05/07/2019 05/03/2019  PHQ - 2 Score 0 0 0 0 0    Assessment: Patient continues hospital recovery and follows up with physicians as scheduled. Goals Addressed  This Visit's Progress   . Monitor and Manage My Blood Sugar   On track    Follow Up Date 05/14/20   - check blood sugar at prescribed times - check blood sugar if I feel it is too high or too low - enter blood sugar readings and medication or insulin into daily log - take the blood sugar log to all doctor visits    Why is this important?   Checking your blood sugar at home helps to keep it from getting very high or very low.  Writing the results in a diary or log helps the doctor know how to care for you.  Your blood sugar log should have the time, date and the results.  Also, write down the amount of insulin or other medicine that you take.  Other information,  like what you ate, exercise done and how you were feeling, will also be helpful.     Notes:     . Track and Manage Heart Rate and Rhythm   On track    Follow Up Date 05/15/20   - check pulse (heart) rate once a day - make a plan to exercise regularly - make a plan to eat healthy - take medicine as prescribed    Why is this important?   Atrial fibrillation may have no symptoms. Sometimes the symptoms get worse or happen more often.  It is important to keep track of what your symptoms are and when they happen.  A change in symptoms is important to discuss with your doctor or nurse.  Being active and healthy eating will also help you manage your heart condition.     Notes:        Plan: RN CM will contact patient again in the month of December and patient agreeable.   [name], RN, MSN [facility] Care Management Care Management Coordinator Direct Line [phone] Cell [phone] Toll Free: [phone]  Fax: [phone]

## 2020-03-16 NOTE — Patient Instructions (Signed)
Goals Addressed            This Visit's Progress   . Monitor and Manage My Blood Sugar   On track    Follow Up Date 05/14/20   - check blood sugar at prescribed times - check blood sugar if I feel it is too high or too low - enter blood sugar readings and medication or insulin into daily log - take the blood sugar log to all doctor visits    Why is this important?   Checking your blood sugar at home helps to keep it from getting very high or very low.  Writing the results in a diary or log helps the doctor know how to care for you.  Your blood sugar log should have the time, date and the results.  Also, write down the amount of insulin or other medicine that you take.  Other information, like what you ate, exercise done and how you were feeling, will also be helpful.     Notes:     . Track and Manage Heart Rate and Rhythm   On track    Follow Up Date 05/15/20   - check pulse (heart) rate once a day - make a plan to exercise regularly - make a plan to eat healthy - take medicine as prescribed    Why is this important?   Atrial fibrillation may have no symptoms. Sometimes the symptoms get worse or happen more often.  It is important to keep track of what your symptoms are and when they happen.  A change in symptoms is important to discuss with your doctor or nurse.  Being active and healthy eating will also help you manage your heart condition.     Notes:

## 2020-03-19 NOTE — Progress Notes (Signed)
Cardiology Clinic Note   Patient Name: [name] Date of Encounter: 03/22/2020  Primary Care Provider:  [name], MD Primary Cardiologist:  Olga Millers, MD  Patient Profile    [name] 86 year old female presents the clinic today for follow-up evaluation of her essential hypertension and atrial fibrillation.  Past Medical History    Past Medical History:  Diagnosis Date  . Atrial fibrillation (HCC)   . Diabetes mellitus   . DVT (deep venous thrombosis) (HCC)   . Hyperlipidemia   . Hypertension   . Leg pain   . Peripheral arterial disease (HCC)   . Stroke [facility]) 1999   Past Surgical History:  Procedure Laterality Date  . BACK SURGERY  1996  . CARDIOVERSION N/A 02/15/2020   Procedure: CARDIOVERSION;  Surgeon: Elease Hashimoto Deloris Ping, MD;  Location: [facility] ENDOSCOPY;  Service: Cardiovascular;  Laterality: N/A;  . CATARACT EXTRACTION  2000  . CESAREAN SECTION    . EYE SURGERY    . FEMORAL BYPASS Left July 23, 2010  . LUMBAR DISC SURGERY    . SPINE SURGERY      Allergies  No Known Allergies  History of Present Illness    [name] has a PMH of essential hypertension, atrial fibrillation, PVD, atrial flutter, type 2 diabetes, pure hypercholesterolemia, hypomagnesemia, DVT, and syncope with collapse.  She is status post left above-the-knee bypass 2012 and had CVA in 2019.  She presented to the emergency department and was seen for evaluation 02/13/2020.  She had noticed her heart rate increased to 138 bpm.  She contacted her PCP who told her to go to the emergency room.  She was completely asymptomatic, denied chest pain/pressure, shortness of breath, dyspnea on exertion, lower extremity edema, dizziness and syncope.  She also denied palpitations.  She reported she felt fine.  In the ED she was noted to have SVT with a heart rate of 116 bpm.  She was given IV Lopressor in the ER however, her heart rate persisted in the 130s.  She was given IV Cardizem and her  heart rate once again did not change.  Cardiology was consulted.  She was given another bolus of IV Cardizem and started on a gtt. her metoprolol was increased to 50 mg twice daily.  She had not missed any doses of her Pradaxa.  She underwent DCCV on 02/15/2020.  She was successfully converted to NSR.  During follow-up 02/16/2020 she reported she was feeling great and ready to go home.  She presents the clinic today for follow-up evaluation states she feels well. She has not noticed any more episodes of increased heart rate. She has been monitoring her blood pressure and heart rate at home have been well controlled. She indicates that she has not missed any doses of her Pradaxa and denies bleeding issues. She is deciding whether she is going to go to [place] or to [place] for Christmas to visit her family. I will give her the salty 6 diet sheet, have her avoid triggers for atrial fibrillation, and have her follow-up in 3 months.  Today she denies chest pain, shortness of breath, lower extremity edema, fatigue, palpitations, melena, hematuria, hemoptysis, diaphoresis, weakness, presyncope, syncope, orthopnea, and PND.   Home Medications    Prior to Admission medications   Medication Sig Start Date End Date Taking? Authorizing Provider  ACCU-CHEK SOFTCLIX LANCETS lancets  01/16/18   [provider]  amoxicillin (AMOXIL) 500 MG capsule Take 500 mg by mouth 3 (three) times  daily. Start date : 02/09/20 02/09/20   [provider]  atorvastatin (LIPITOR) 40 MG tablet TAKE 1 TABLET BY MOUTH ONCE A DAY Patient taking differently: Take 40 mg by mouth daily.  06/17/11   Lewayne Bunting, MD  cholecalciferol (VITAMIN D3) 25 MCG (1000 UNIT) tablet Take 1,000 Units by mouth daily.    [provider]  dabigatran (PRADAXA) 150 MG CAPS capsule Take 1 capsule (150 mg total) by mouth 2 (two) times daily. 04/23/19   Lewayne Bunting, MD  diltiazem (CARDIZEM) 60 MG tablet Take 1 tablet (60 mg  total) by mouth every 6 (six) hours. 02/16/20   Glade Lloyd, MD  fish oil-omega-3 fatty acids 1000 MG capsule Take 1 g by mouth daily.      [provider]  glipiZIDE (GLUCOTROL) 10 MG tablet Take 10 mg by mouth 2 (two) times daily before a meal.      [provider]  latanoprost (XALATAN) 0.005 % ophthalmic solution Place 1 drop into both eyes at bedtime.    [provider]  linagliptin (TRADJENTA) 5 MG TABS tablet Take 1 tablet (5 mg total) by mouth daily. 05/20/17   Lewayne Bunting, MD  lisinopril (PRINIVIL,ZESTRIL) 10 MG tablet TAKE 1 TABLET DAILY Patient taking differently: Take 10 mg by mouth daily.  12/03/11   Lewayne Bunting, MD  metFORMIN (GLUCOPHAGE) 500 MG tablet Take 1,000 mg by mouth 2 (two) times daily with a meal.     [provider]  metoprolol tartrate (LOPRESSOR) 50 MG tablet Take 1 tablet (50 mg total) by mouth 2 (two) times daily. 02/16/20   Glade Lloyd, MD  TRUETEST TEST test strip  02/22/13   [provider]  iron polysaccharides (NIFEREX) 150 MG capsule Take 150 mg by mouth daily.    07/04/11  [provider]  topiramate (TOPAMAX) 15 MG capsule Take 15 mg by mouth 2 (two) times daily.    07/04/11  [provider]    Family History    Family History  Problem Relation Age of Onset  . Hypertension Mother   . Heart attack Mother   . Diabetes Father   . Hypertension Father   . Stroke Father   . Stroke Sister   . Diabetes Sister   . Heart disease Sister        Before age 29-Aneurysm  . Dementia Sister   . Alzheimer's disease Sister   . Diabetes Brother   . Diabetes Brother   . Heart disease Brother        Before age 66  . Heart attack Brother   . Heart attack Sister   . Diabetes Sister   . Alzheimer's disease Sister    She indicated that her mother is deceased. She indicated that her father is deceased. She indicated that only one of her three sisters is alive. She indicated that only one of her  two brothers is alive. She indicated that her maternal grandmother is deceased. She indicated that her maternal grandfather is deceased. She indicated that her paternal grandmother is deceased. She indicated that her paternal grandfather is deceased.  Social History    Social History   Socioeconomic History  . Marital status: Widowed    Spouse name: Not on file  . Number of children: 1  . Years of education: Not on file  . Highest education level: Not on file  Occupational History  . Occupation: Retired    Comment: Teacher, adult education  Tobacco Use  . Smoking status: Never Smoker  . Smokeless tobacco: Never Used  Vaping Use  . Vaping Use: Never used  Substance and Sexual Activity  . Alcohol use: No  . Drug use: No  . Sexual activity: Not on file  Other Topics Concern  . Not on file  Social History Narrative   Lives alone. Has daily contact with friends. Transportation by friends and sometimes [employer].   Social Determinants of Health   Financial Resource Strain: Low Risk   . Difficulty of Paying Living Expenses: Not hard at all  Food Insecurity: No Food Insecurity  . Worried About Programme researcher, broadcasting/film/video in the Last Year: Never true  . Ran Out of Food in the Last Year: Never true  Transportation Needs: No Transportation Needs  . Lack of Transportation (Medical): No  . Lack of Transportation (Non-Medical): No  Physical Activity: Sufficiently Active  . Days of Exercise per Week: 7 days  . Minutes of Exercise per Session: 120 min  Stress: No Stress Concern Present  . Feeling of Stress : Not at all  Social Connections:   . Frequency of Communication with Friends and Family: Not on file  . Frequency of Social Gatherings with Friends and Family: Not on file  . Attends Religious Services: Not on file  . Active Member of Clubs or Organizations: Not on file  . Attends Banker Meetings: Not on file  . Marital Status: Not on file  Intimate Partner  Violence:   . Fear of Current or Ex-Partner: Not on file  . Emotionally Abused: Not on file  . Physically Abused: Not on file  . Sexually Abused: Not on file     Review of Systems    General:  No chills, fever, night sweats or weight changes.  Cardiovascular:  No chest pain, dyspnea on exertion, edema, orthopnea, palpitations, paroxysmal nocturnal dyspnea. Dermatological: No rash, lesions/masses Respiratory: No cough, dyspnea Urologic: No hematuria, dysuria Abdominal:   No nausea, vomiting, diarrhea, bright red blood per rectum, melena, or hematemesis Neurologic:  No visual changes, wkns, changes in mental status. All other systems reviewed and are otherwise negative except as noted above.  Physical Exam    VS:  BP 136/72   Pulse 74   Wt 93 lb 12.8 oz (42.5 kg)   BMI 18.95 kg/m  , BMI Body mass index is 18.95 kg/m. GEN: Well nourished, well developed, in no acute distress. HEENT: normal. Neck: Supple, no JVD, carotid bruits, or masses. Cardiac: RRR, no murmurs, rubs, or gallops. No clubbing, cyanosis, edema.  Radials/DP/PT 2+ and equal bilaterally.  Respiratory:  Respirations regular and unlabored, clear to auscultation bilaterally. GI: Soft, nontender, nondistended, BS + x 4. MS: no deformity or atrophy. Skin: warm and dry, no rash. Neuro:  Strength and sensation are intact. Psych: Normal affect.  Accessory Clinical Findings    Recent Labs: 02/12/2020: ALT 33 02/13/2020: B Natriuretic Peptide 222.4; TSH 2.718 02/16/2020: BUN 27; Creatinine, Ser 0.89; Hemoglobin 9.5; Magnesium 1.8; Platelets 197; Potassium 4.5; Sodium 139   Recent Lipid Panel    Component Value Date/Time   CHOL 105 02/16/2020 0513   TRIG 43 02/16/2020 0513   HDL 37 (L) 02/16/2020 0513   CHOLHDL 2.8 02/16/2020 0513   VLDL 9 02/16/2020 0513   LDLCALC 59 02/16/2020 0513    ECG personally reviewed by me today-sinus rhythm with premature atrial complexes 85 bpm- No acute changes  EKG12/05/2019 Sinus  rhythm with PVCs 63 bpm  Echocardiogram  02/14/2020 IMPRESSIONS    1. Left ventricular ejection fraction, by estimation, is 50 to 55%. The  left ventricle has low normal function. The left ventricle has no regional  wall motion abnormalities. Indeterminate diastolic filling due to E-A  fusion.  2. Right ventricular systolic function is mildly reduced. The right  ventricular size is normal. There is normal pulmonary artery systolic  pressure.  3. The mitral valve is normal in structure. Trivial mitral valve  regurgitation.  4. The aortic valve was not well visualized. Aortic valve regurgitation  is not visualized.   Comparison(s): Compared to prior report in 2009, the patient is now in  Afib with RVR with LVEF 50-55%.   Assessment & Plan   1.  Atrial flutter with RVR-HR today 85.  No recent episodes of heart flutter or accelerated heart rate.  Has been increasing her physical activity. EKG today shows sinus rhythm with premature atrial complexes 85 bpm Continue Cardizem, Pradaxa, Lopressor Heart healthy low-sodium diet-salty 6 given Increase physical activity as tolerated Avoid triggers caffeine, chocolate, EtOH etc.  Essential hypertension-BP today 136/72.  Well-controlled at home. Continue metoprolol, lisinopril, Cardizem Heart healthy low-sodium diet-salty 6 given Increase physical activity as tolerated  Hyperlipidemia-02/16/2020: Cholesterol 105; HDL 37; LDL Cholesterol 59; Triglycerides 43; VLDL 9 Continue atorvastatin Heart healthy low-sodium high-fiber diet Increase physical activity as tolerated  Chronic anemia-9.5 on 02/16/2020.  No increased fatigue. Followed by PCP  Disposition: Follow-up with Dr. Jens Som or myself in 3 months.  Thomasene Ripple.  NP-C    03/22/2020, 8:05 AM [facility] [address] Office [phone] Fax [phone]  Notice: This dictation was prepared with Dragon dictation along with smaller  phrase technology. Any transcriptional errors that result from this process are unintentional and may not be corrected upon review.

## 2020-03-22 ENCOUNTER — Ambulatory Visit: Payer: Medicare HMO | Admitting: General Practice

## 2020-03-22 ENCOUNTER — Encounter: Payer: Self-pay | Admitting: General Practice

## 2020-03-22 ENCOUNTER — Other Ambulatory Visit: Payer: Self-pay

## 2020-03-22 VITALS — BP 136/72 | HR 74 | Wt 93.8 lb

## 2020-03-22 DIAGNOSIS — I10 Essential (primary) hypertension: Secondary | ICD-10-CM | POA: Diagnosis not present

## 2020-03-22 DIAGNOSIS — I480 Paroxysmal atrial fibrillation: Secondary | ICD-10-CM | POA: Diagnosis not present

## 2020-03-22 DIAGNOSIS — E785 Hyperlipidemia, unspecified: Secondary | ICD-10-CM

## 2020-03-22 DIAGNOSIS — R6889 Other general symptoms and signs: Secondary | ICD-10-CM | POA: Diagnosis not present

## 2020-03-22 DIAGNOSIS — D649 Anemia, unspecified: Secondary | ICD-10-CM

## 2020-03-22 MED ORDER — ATORVASTATIN CALCIUM 40 MG PO TABS: 40.0000 mg | ORAL_TABLET | Freq: Every day | ORAL | 3 refills | Status: AC

## 2020-03-22 NOTE — Patient Instructions (Addendum)
Medication Instructions:  The current medical regimen is effective;  continue present plan and medications as directed. Please refer to the Current Medication list given to you today.  *If you need a refill on your cardiac medications before your next appointment, please call your pharmacy*  Lab Work:   Testing/Procedures:  NONE    NONE  Special Instructions CONTINUE TO TAKE BLOOD PRESSURE AT HOME AND CALL WITH ANY CONCERNING NUMBERS  PLEASE READ AND FOLLOW SALTY 6-ATTACHED-1,800mg  daily  Please try to avoid these triggers:  Do not use any products that have nicotine or tobacco in them. These include cigarettes, e-cigarettes, and chewing tobacco. If you need help quitting, ask your doctor.  Eat heart-healthy foods. Talk with your doctor about the right eating plan for you.  Exercise regularly as told by your doctor.  Do not drink alcohol, Caffeine or chocolate.  Lose weight if you are overweight.  Do not use drugs, including cannabis  MAKE SURE TO STAY HYDRATED   Follow-Up: Your next appointment:  3 month(s) In Person with Olga Millers, MD OR IF UNAVAILABLE JESSE CLEAVER, FNP-C  At [facility], you and your health needs are our priority.  As part of our continuing mission to provide you with exceptional heart care, we have created designated Provider Care Teams.  These Care Teams include your primary Cardiologist (physician) and Advanced Practice Providers (APPs -  Physician Assistants and Nurse Practitioners) who all work together to provide you with the care you need, when you need it.  We recommend signing up for the patient portal called "MyChart".  Sign up information is provided on this After Visit Summary.  MyChart is used to connect with patients for Virtual Visits (Telemedicine).  Patients are able to view lab/test results, encounter notes, upcoming appointments, etc.  Non-urgent messages can be sent to your provider as well.   To learn more about what you can do with  MyChart, go to [email or web address].              6 SALTY THINGS TO AVOID     1,800MG  DAILY

## 2020-03-23 ENCOUNTER — Ambulatory Visit: Payer: Self-pay

## 2020-03-27 ENCOUNTER — Other Ambulatory Visit: Payer: Self-pay

## 2020-03-27 ENCOUNTER — Encounter: Payer: Self-pay | Admitting: Physician Assistant

## 2020-03-27 ENCOUNTER — Ambulatory Visit (HOSPITAL_COMMUNITY)
Admission: RE | Admit: 2020-03-27 | Discharge: 2020-03-27 | Disposition: A | Discharge status: Home / Self Care | Payer: Medicare HMO | Source: Ambulatory Visit | Attending: Physician Assistant | Admitting: Physician Assistant

## 2020-03-27 ENCOUNTER — Ambulatory Visit: Payer: Medicare HMO | Admitting: Physician Assistant

## 2020-03-27 VITALS — BP 156/77 | HR 78 | Temp 97.9°F | Resp 20 | Ht 59.0 in | Wt 92.2 lb

## 2020-03-27 DIAGNOSIS — I779 Disorder of arteries and arterioles, unspecified: Secondary | ICD-10-CM | POA: Insufficient documentation

## 2020-03-27 NOTE — Progress Notes (Signed)
History of Present Illness:  Patient is a 86 y.o. year old female who presents for evaluation of known PAD.  S/P left above-knee to below-knee popliteal bypass. The patient has single vessel runoff via the posterior tibial artery previously.   The graft occluded shortly after it's placement.  She denise symptoms of claudication and is able to do ADL's and exercise daily without any changes in the last year.  She denise loss of motor, loss of sensation and no non healing wounds.  She takes a daily Statin and Pradaxa for afib BID.    Past Medical History:  Diagnosis Date  . Atrial fibrillation (HCC)   . Diabetes mellitus   . DVT (deep venous thrombosis) (HCC)   . Hyperlipidemia   . Hypertension   . Leg pain   . Peripheral arterial disease (HCC)   . Stroke [facility]) 1999    Past Surgical History:  Procedure Laterality Date  . BACK SURGERY  1996  . CARDIOVERSION N/A 02/15/2020   Procedure: CARDIOVERSION;  Surgeon: Elease Hashimoto Deloris Ping, MD;  Location: [facility] ENDOSCOPY;  Service: Cardiovascular;  Laterality: N/A;  . CATARACT EXTRACTION  2000  . CESAREAN SECTION    . EYE SURGERY    . FEMORAL BYPASS Left July 23, 2010  . LUMBAR DISC SURGERY    . SPINE SURGERY      ROS:   General:  No weight loss, Fever, chills  HEENT: No recent headaches, no nasal bleeding, no visual changes, no sore throat  Neurologic: No dizziness, blackouts, seizures. No recent symptoms of stroke or mini- stroke. No recent episodes of slurred speech, or temporary blindness.  Cardiac: No recent episodes of chest pain/pressure, no shortness of breath at rest.  No shortness of breath with exertion.  Denies history of atrial fibrillation or irregular heartbeat  Vascular: No history of rest pain in feet.  No history of claudication.  No history of non-healing ulcer, No history of DVT   Pulmonary: No home oxygen, no productive cough, no hemoptysis,  No asthma or wheezing  Musculoskeletal:  [ ]  Arthritis, [ ]  Low back pain,   [ ]  Joint pain  Hematologic:No history of hypercoagulable state.  No history of easy bleeding.  No history of anemia  Gastrointestinal: No hematochezia or melena,  No gastroesophageal reflux, no trouble swallowing  Urinary: [ ]  chronic Kidney disease, [ ]  on HD - [ ]  MWF or [ ]  TTHS, [ ]  Burning with urination, [ ]  Frequent urination, [ ]  Difficulty urinating;   Skin: No rashes  Psychological: No history of anxiety,  No history of depression  Social History Social History   Tobacco Use  . Smoking status: Never Smoker  . Smokeless tobacco: Never Used  Vaping Use  . Vaping Use: Never used  Substance Use Topics  . Alcohol use: No  . Drug use: No    Family History Family History  Problem Relation Age of Onset  . Hypertension Mother   . Heart attack Mother   . Diabetes Father   . Hypertension Father   . Stroke Father   . Stroke Sister   . Diabetes Sister   . Heart disease Sister        Before age 13-Aneurysm  . Dementia Sister   . Alzheimer's disease Sister   . Diabetes Brother   . Diabetes Brother   . Heart disease Brother        Before age 52  . Heart attack Brother   .  Heart attack Sister   . Diabetes Sister   . Alzheimer's disease Sister     Allergies  No Known Allergies   Current Outpatient Medications  Medication Sig Dispense Refill  . ACCU-CHEK SOFTCLIX LANCETS lancets     . amoxicillin (AMOXIL) 500 MG capsule Take 500 mg by mouth 3 (three) times daily. Start date : 02/09/20    . atorvastatin (LIPITOR) 40 MG tablet Take 1 tablet (40 mg total) by mouth daily. 90 tablet 3  . cholecalciferol (VITAMIN D3) 25 MCG (1000 UNIT) tablet Take 1,000 Units by mouth daily.    . dabigatran (PRADAXA) 150 MG CAPS capsule Take 1 capsule (150 mg total) by mouth 2 (two) times daily. 180 capsule 1  . diltiazem (CARDIZEM) 60 MG tablet Take 1 tablet (60 mg total) by mouth every 6 (six) hours. 120 tablet 0  . fish oil-omega-3 fatty acids 1000 MG capsule Take 1 g by mouth  daily.    Marland Kitchen glipiZIDE (GLUCOTROL) 10 MG tablet Take 10 mg by mouth 2 (two) times daily before a meal.    . latanoprost (XALATAN) 0.005 % ophthalmic solution Place 1 drop into both eyes at bedtime.    Marland Kitchen linagliptin (TRADJENTA) 5 MG TABS tablet Take 1 tablet (5 mg total) by mouth daily. 90 tablet 3  . lisinopril (PRINIVIL,ZESTRIL) 10 MG tablet TAKE 1 TABLET DAILY (Patient taking differently: Take 10 mg by mouth daily.) 30 tablet 0  . metFORMIN (GLUCOPHAGE) 500 MG tablet Take 1,000 mg by mouth 2 (two) times daily with a meal.    . metoprolol tartrate (LOPRESSOR) 50 MG tablet Take 1 tablet (50 mg total) by mouth 2 (two) times daily. 60 tablet 0  . TRUETEST TEST test strip      No current facility-administered medications for this visit.    Physical Examination  Vitals:   03/27/20 1050  BP: (!) 156/77  Pulse: 78  Resp: 20  Temp: 97.9 F (36.6 C)  TempSrc: Temporal  SpO2: 100%  Weight: 92 lb 3.2 oz (41.8 kg)  Height: 4\' 11"  (1.499 m)    Body mass index is 18.62 kg/m.  General:  Alert and oriented, no acute distress HEENT: Normal Neck: No bruit or JVD Pulmonary: Clear to auscultation bilaterally Cardiac: Regular Rate and Rhythm without murmur Abdomen: Soft, non-tender, non-distended, no mass, no scars Skin: No rash, no evidence of ischemic changes to B LE. Extremity Pulses:  2+ radial, brachial, femoral, right LE posterior tibial pulses, weak doppler signals PT/DP on the left LE Musculoskeletal: No deformity or edema  Neurologic: Upper and lower extremity motor 5/5 and symmetric  DATA:    ABI Findings:  +---------+------------------+-----+---------+--------+  Right  Rt Pressure (mmHg)IndexWaveform Comment   +---------+------------------+-----+---------+--------+  Brachial 161                      +---------+------------------+-----+---------+--------+  PTA   255        1.58 biphasic        +---------+------------------+-----+---------+--------+  DP    255        1.58 triphasic      +---------+------------------+-----+---------+--------+  Great Toe111        0.69            +---------+------------------+-----+---------+--------+   +--------+------------------+-----+----------+-------+  Left  Lt Pressure (mmHg)IndexWaveform Comment  +--------+------------------+-----+----------+-------+                       +--------+------------------+-----+----------+-------+  PTA   255  1.58 monophasic      +--------+------------------+-----+----------+-------+  DP   0         0.00 absent        +--------+------------------+-----+----------+-------+   +-------+-----------+-----------+------------+------------+  ABI/TBIToday's ABIToday's TBIPrevious ABIPrevious TBI  +-------+-----------+-----------+------------+------------+  Right [place]     0.69    [place]     0.53      +-------+-----------+-----------+------------+------------+  Left  [place]     0          0.21      +-------+-----------+-----------+------------+------------+    Previous ABI on 02/24/19.    Summary:  Right: Resting right ankle-brachial index indicates noncompressible right  lower extremity arteries. The right toe-brachial index is abnormal. RT  great toe pressure = 111 mmHg.   Left: Resting left ankle-brachial index indicates noncompressible left  lower extremity arteries. The left toe-brachial index is abnormal.   ASSESSMENT:  Chronically Occluded left above-knee to below-knee popliteal bypass.  She denise new or increased symptoms of claudication, rest pain or non healing wounds.  She is able to perform all daily activities.   No significant changes in ABI's  PLAN: At this time she will continue with daily activities and exercise as  she tolerates without restrictions.  If she develops symptoms of rest pain, non healing wounds or significant claudication she will call us.  Other wise she will f/u in 1 year for repeat ABI's.   Mosetta Pigeon PA-C [facility] Office: [phone]  MD on call [facility]

## 2020-03-30 ENCOUNTER — Telehealth: Payer: Self-pay | Admitting: Cardiology

## 2020-03-30 NOTE — Telephone Encounter (Signed)
Patient is inquiring about AMOXICILLIN This was on med list from last hospitalization   Need for prophylactic antibiotic Noted 02/14/2020 [facility] problem   Current Assessment & Plan Note -Patient has upcoming dental procedure and has been on amoxicillin for prophylaxis -This is continued while inpatient"   She is having 2 teeth extracted on 04/11/2020 -- she plans to HOLD pradaxa starting on 04/06/20  Patient states she has never taken antibiotics for prophylaxis  Will route to MD/RN to review

## 2020-03-30 NOTE — Telephone Encounter (Signed)
No need for prophylaxis from a cardiac standpoint. [name]

## 2020-03-30 NOTE — Telephone Encounter (Signed)
Pt c/o medication issue: 1. Name of Medication: Amoxicillin 2. How are you currently taking this medication (dosage and times per day)? Patient is not taking it 3. Are you having a reaction (difficulty breathing--STAT)?  No 4. What is your medication issue?  Patient not sure if she is suppose to be taken this medication.

## 2020-03-30 NOTE — Telephone Encounter (Signed)
Spoke with pt, Aware of dr crenshaw's recommendations.  °

## 2020-04-03 DIAGNOSIS — E1151 Type 2 diabetes mellitus with diabetic peripheral angiopathy without gangrene: Secondary | ICD-10-CM | POA: Diagnosis not present

## 2020-04-03 DIAGNOSIS — L84 Corns and callosities: Secondary | ICD-10-CM | POA: Diagnosis not present

## 2020-04-03 DIAGNOSIS — L603 Nail dystrophy: Secondary | ICD-10-CM | POA: Diagnosis not present

## 2020-04-03 DIAGNOSIS — I739 Peripheral vascular disease, unspecified: Secondary | ICD-10-CM | POA: Diagnosis not present

## 2020-04-04 ENCOUNTER — Other Ambulatory Visit: Payer: Self-pay

## 2020-04-04 NOTE — Patient Instructions (Signed)
Goals Addressed            This Visit's Progress    Monitor and Manage My Blood Sugar   On track    Timeframe: Long-Range Goal Priority:  High Start Date:    04/04/20                         Expected End Date:                      Follow Up Date 05/14/20   - check blood sugar at prescribed times - check blood sugar if I feel it is too high or too low - enter blood sugar readings and medication or insulin into daily log - take the blood sugar log to all doctor visits    Why is this important?   Checking your blood sugar at home helps to keep it from getting very high or very low.  Writing the results in a diary or log helps the doctor know how to care for you.  Your blood sugar log should have the time, date and the results.  Also, write down the amount of insulin or other medicine that you take.  Other information, like what you ate, exercise done and how you were feeling, will also be helpful.     Notes:      Track and Manage Heart Rate and Rhythm   On track    Timeframe:  Long-Range Goal Priority:  High Start Date:   04/04/20                          Expected End Date:05/15/20                    Follow Up Date 05/15/20   - check pulse (heart) rate once a day - make a plan to exercise regularly - make a plan to eat healthy - take medicine as prescribed    Why is this important?   Atrial fibrillation may have no symptoms. Sometimes the symptoms get worse or happen more often.  It is important to keep track of what your symptoms are and when they happen.  A change in symptoms is important to discuss with your doctor or nurse.  Being active and healthy eating will also help you manage your heart condition.     Notes:

## 2020-04-04 NOTE — Patient Outreach (Signed)
[facility]) Care Management  [facility] Care Manager  04/04/2020   [name] 1932-11-16 [id number]  Subjective: Telephone call to patient for follow up. Patient doing good. She reports that her sugars are better controlled. She reports most recent sugar 140 this morning. She states last blood pressure 145/72 HR-64.  Reviewed Signs of A. Fibrillation/Flutter.  Discussed importance of continuing diabetic diet. She verbalized understanding. CM made sure she had CM contact information for future reference.    Objective:   Encounter Medications:  Outpatient Encounter Medications as of 04/04/2020  Medication Sig  . ACCU-CHEK SOFTCLIX LANCETS lancets   . amoxicillin (AMOXIL) 500 MG capsule Take 500 mg by mouth 3 (three) times daily. Start date : 02/09/20  . atorvastatin (LIPITOR) 40 MG tablet Take 1 tablet (40 mg total) by mouth daily.  . cholecalciferol (VITAMIN D3) 25 MCG (1000 UNIT) tablet Take 1,000 Units by mouth daily.  . dabigatran (PRADAXA) 150 MG CAPS capsule Take 1 capsule (150 mg total) by mouth 2 (two) times daily.  Marland Kitchen diltiazem (CARDIZEM) 60 MG tablet Take 1 tablet (60 mg total) by mouth every 6 (six) hours.  . fish oil-omega-3 fatty acids 1000 MG capsule Take 1 g by mouth daily.  Marland Kitchen glipiZIDE (GLUCOTROL) 10 MG tablet Take 10 mg by mouth 2 (two) times daily before a meal.  . latanoprost (XALATAN) 0.005 % ophthalmic solution Place 1 drop into both eyes at bedtime.  Marland Kitchen linagliptin (TRADJENTA) 5 MG TABS tablet Take 1 tablet (5 mg total) by mouth daily.  Marland Kitchen lisinopril (PRINIVIL,ZESTRIL) 10 MG tablet TAKE 1 TABLET DAILY (Patient taking differently: Take 10 mg by mouth daily.)  . metFORMIN (GLUCOPHAGE) 500 MG tablet Take 1,000 mg by mouth 2 (two) times daily with a meal.  . metoprolol tartrate (LOPRESSOR) 50 MG tablet Take 1 tablet (50 mg total) by mouth 2 (two) times daily.  . TRUETEST TEST test strip   . [DISCONTINUED] iron polysaccharides (NIFEREX) 150 MG capsule  Take 150 mg by mouth daily.    . [DISCONTINUED] topiramate (TOPAMAX) 15 MG capsule Take 15 mg by mouth 2 (two) times daily.     No facility-administered encounter medications on file as of 04/04/2020.    Functional Status:  In your present state of health, do you have any difficulty performing the following activities: 02/17/2020 02/14/2020  Hearing? N -  Vision? N -  Difficulty concentrating or making decisions? N -  Walking or climbing stairs? N -  Dressing or bathing? N -  Doing errands, shopping? 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 N -  Using the Toilet? N -  In the past six months, have you accidently leaked urine? N -  Do you have problems with loss of bowel control? N -  Managing your Medications? N -  Managing your Finances? N -  Housekeeping or managing your Housekeeping? - -  Some recent data might be hidden    Fall/Depression Screening: Fall Risk  02/17/2020 01/31/2020 08/30/2019  Falls in the past year? 0 0 0  Number falls in past yr: - - -  Injury with Fall? - - -   PHQ 2/9 Scores 02/17/2020 01/31/2020 08/30/2019 05/07/2019 05/03/2019  PHQ - 2 Score 0 0 0 0 0    Assessment: Patient continues to manage chronic conditions and see physicians as scheduled. Goals Addressed            This Visit's Progress   . Monitor and Manage My Blood Sugar  On track    Timeframe: Long-Range Goal Priority:  High Start Date:    04/04/20                         Expected End Date:                      Follow Up Date 05/14/20   - check blood sugar at prescribed times - check blood sugar if I feel it is too high or too low - enter blood sugar readings and medication or insulin into daily log - take the blood sugar log to all doctor visits    Why is this important?   Checking your blood sugar at home helps to keep it from getting very high or very low.  Writing the results in a diary or log helps the doctor know how to care for you.  Your blood sugar log should have the time, date and  the results.  Also, write down the amount of insulin or other medicine that you take.  Other information, like what you ate, exercise done and how you were feeling, will also be helpful.     Notes:     . Track and Manage Heart Rate and Rhythm   On track    Timeframe:  Long-Range Goal Priority:  High Start Date:   04/04/20                          Expected End Date:05/15/20                    Follow Up Date 05/15/20   - check pulse (heart) rate once a day - make a plan to exercise regularly - make a plan to eat healthy - take medicine as prescribed    Why is this important?   Atrial fibrillation may have no symptoms. Sometimes the symptoms get worse or happen more often.  It is important to keep track of what your symptoms are and when they happen.  A change in symptoms is important to discuss with your doctor or nurse.  Being active and healthy eating will also help you manage your heart condition.     Notes:        Plan: RN CM will contact next month.   Follow-up:  Patient agrees to Care Plan and Follow-up.   [name], RN, MSN [facility] Care Management Care Management Coordinator Direct Line [phone] Cell [phone] Toll Free: [phone]  Fax: [phone]

## 2020-04-04 NOTE — Patient Outreach (Addendum)
[facility]) Care Management  04/04/2020  [name] 08-27-1932 [id number]   Telephone call to patient for disease management follow up.   No answer.  HIPAA compliant voice message left.    Plan: If no return call, RN CM will attempt patient again within the next 4 business days.     12:00 pm Update: Telephone call again to patient after she returned call to the nurse line.  HIPAA compliant voice message left.   Plan: RN CM will attempt again within 4 business days.    [name], RN, MSN [facility] Care Management Care Management Coordinator Direct Line [phone] Cell [phone] Toll Free: [phone]  Fax: [phone]

## 2020-04-10 ENCOUNTER — Ambulatory Visit: Payer: Self-pay

## 2020-04-18 ENCOUNTER — Telehealth: Payer: Self-pay | Admitting: *Deleted

## 2020-04-18 DIAGNOSIS — I10 Essential (primary) hypertension: Secondary | ICD-10-CM | POA: Diagnosis not present

## 2020-04-18 DIAGNOSIS — E782 Mixed hyperlipidemia: Secondary | ICD-10-CM | POA: Diagnosis not present

## 2020-04-18 DIAGNOSIS — E1169 Type 2 diabetes mellitus with other specified complication: Secondary | ICD-10-CM | POA: Diagnosis not present

## 2020-04-18 DIAGNOSIS — I4891 Unspecified atrial fibrillation: Secondary | ICD-10-CM | POA: Diagnosis not present

## 2020-04-18 NOTE — Progress Notes (Signed)
Virtual Visit via Video Note   This visit type was conducted due to national recommendations for restrictions regarding the COVID-19 Pandemic (e.g. social distancing) in an effort to limit this patient's exposure and mitigate transmission in our community.  Due to her co-morbid illnesses, this patient is at least at moderate risk for complications without adequate follow up.  This format is felt to be most appropriate for this patient at this time.  All issues noted in this document were discussed and addressed.  A limited physical exam was performed with this format.  Please refer to the patient's chart for her consent to telehealth for [facility].      Date:  05/02/2020   ID:  [name], DOB 08/01/1932, MRN [id number]  Patient Location:Home Provider Location: Home  PCP:  [name], MD  Cardiologist:  Dr Jens Som  Evaluation Performed:  Follow-Up Visit  Chief Complaint:  FU PAF  History of Present Illness:    HPI: FU paroxysmal atrial fibrillation, atrial flutter and prior CVA. She had a Myoview performed on Aug 27, 2005, that showed an ejection fraction of 80% and normal perfusion. Patient followed by vascular surgery for peripheral vascular disease.  ABIs December 2021 noncompressible on the right and left. Echocardiogram November 2021 showed ejection fraction 50 to 55%, mild RV dysfunction.  Had cardioversion November 2021.  Since I last saw her,  patient denies dyspnea, chest pain, palpitations, syncope or bleeding.  She has not fallen.  The patient does not have symptoms concerning for COVID-19 infection (fever, chills, cough, or new shortness of breath).    Past Medical History:  Diagnosis Date  . Atrial fibrillation (HCC)   . Diabetes mellitus   . DVT (deep venous thrombosis) (HCC)   . Hyperlipidemia   . Hypertension   . Leg pain   . Peripheral arterial disease (HCC)   . Stroke [facility]) 1999   Past Surgical History:  Procedure Laterality Date  . BACK  SURGERY  1996  . CARDIOVERSION N/A 02/15/2020   Procedure: CARDIOVERSION;  Surgeon: Elease Hashimoto Deloris Ping, MD;  Location: [facility] ENDOSCOPY;  Service: Cardiovascular;  Laterality: N/A;  . CATARACT EXTRACTION  2000  . CESAREAN SECTION    . EYE SURGERY    . FEMORAL BYPASS Left July 23, 2010  . LUMBAR DISC SURGERY    . SPINE SURGERY       Current Meds  Medication Sig  . ACCU-CHEK SOFTCLIX LANCETS lancets   . atorvastatin (LIPITOR) 40 MG tablet Take 1 tablet (40 mg total) by mouth daily.  . cholecalciferol (VITAMIN D3) 25 MCG (1000 UNIT) tablet Take 1,000 Units by mouth daily.  . dabigatran (PRADAXA) 150 MG CAPS capsule Take 1 capsule (150 mg total) by mouth 2 (two) times daily.  Marland Kitchen diltiazem (CARDIZEM) 60 MG tablet Take 1 tablet (60 mg total) by mouth every 6 (six) hours.  . fish oil-omega-3 fatty acids 1000 MG capsule Take 1 g by mouth daily.  Marland Kitchen glipiZIDE (GLUCOTROL) 10 MG tablet Take 10 mg by mouth 2 (two) times daily before a meal.  . latanoprost (XALATAN) 0.005 % ophthalmic solution Place 1 drop into both eyes at bedtime.  Marland Kitchen linagliptin (TRADJENTA) 5 MG TABS tablet Take 1 tablet (5 mg total) by mouth daily.  Marland Kitchen lisinopril (PRINIVIL,ZESTRIL) 10 MG tablet TAKE 1 TABLET DAILY (Patient taking differently: Take 10 mg by mouth daily.)  . metFORMIN (GLUCOPHAGE) 500 MG tablet Take 1,000 mg by mouth 2 (two) times daily with a meal.  .  metoprolol tartrate (LOPRESSOR) 50 MG tablet Take 1 tablet (50 mg total) by mouth 2 (two) times daily.  . TRUETEST TEST test strip      Allergies:   Patient has no known allergies.   Social History   Tobacco Use  . Smoking status: Never Smoker  . Smokeless tobacco: Never Used  Vaping Use  . Vaping Use: Never used  Substance Use Topics  . Alcohol use: No  . Drug use: No     Family Hx: The patient's family history includes Alzheimer's disease in her sister and sister; Dementia in her sister; Diabetes in her brother, brother, father, sister, and sister; Heart attack  in her brother, mother, and sister; Heart disease in her brother and sister; Hypertension in her father and mother; Stroke in her father and sister.  ROS:   Please see the history of present illness.    No Fever, chills  or productive cough All other systems reviewed and are negative.   Recent Labs: 02/12/2020: ALT 33 02/13/2020: B Natriuretic Peptide 222.4; TSH 2.718 02/16/2020: BUN 27; Creatinine, Ser 0.89; Hemoglobin 9.5; Magnesium 1.8; Platelets 197; Potassium 4.5; Sodium 139   Recent Lipid Panel Lab Results  Component Value Date/Time   CHOL 105 02/16/2020 05:13 AM   TRIG 43 02/16/2020 05:13 AM   HDL 37 (L) 02/16/2020 05:13 AM   CHOLHDL 2.8 02/16/2020 05:13 AM   LDLCALC 59 02/16/2020 05:13 AM    Wt Readings from Last 3 Encounters:  05/02/20 98 lb (44.5 kg)  03/27/20 92 lb 3.2 oz (41.8 kg)  03/22/20 93 lb 12.8 oz (42.5 kg)     Objective:    Vital Signs:  BP 121/79   Pulse 75   Ht 4\' 11"  (1.499 m)   Wt 98 lb (44.5 kg)   BMI 19.79 kg/m    VITAL SIGNS:  reviewed NAD Answers questions appropriately Normal affect Remainder of physical examination not performed (telehealth visit; coronavirus pandemic)  ASSESSMENT & PLAN:    1 paroxysmal atrial fibrillation/atrial flutter-patient remains in sinus rhythm today.  Continue Cardizem and Lopressor for rate control if atrial arrhythmias recur.  Continue Pradaxa.  2 hypertension-patient's blood pressure is controlled today.  Continue present medications and follow.  3 hyperlipidemia-continue statin.  4 peripheral vascular disease-continue statin.  She is not on aspirin given need for apixaban.  Vascular disease is followed by vascular surgery.   COVID-19 Education: The importance of social distancing was discussed today.  Time:   Today, I have spent 16 minutes with the patient with telehealth technology discussing the above problems.     Medication Adjustments/Labs and Tests Ordered: Current medicines are reviewed  at length with the patient today.  Concerns regarding medicines are outlined above.   Tests Ordered: No orders of the defined types were placed in this encounter.   Medication Changes: No orders of the defined types were placed in this encounter.   Follow Up:  In Person in 6 month(s)  Signed, , MD  05/02/2020 8:26 AM

## 2020-04-18 NOTE — Telephone Encounter (Signed)
Left message for patient, pradaxa patient assistance application mailed to the company.

## 2020-04-25 ENCOUNTER — Other Ambulatory Visit: Payer: Self-pay

## 2020-04-25 NOTE — Patient Outreach (Signed)
[facility]) Care Management  04/25/2020  [name] 1933/03/05 [id number]   Telephone call to patient for disease management follow up.   No answer.  HIPAA compliant voice message left.    Plan: If no return call, RN CM will attempt patient again within 4 business days.  [name], RN, MSN [facility] Care Management Care Management Coordinator Direct Line [phone] Cell [phone] Toll Free: [phone]  Fax: [phone]

## 2020-04-26 ENCOUNTER — Ambulatory Visit: Payer: Self-pay

## 2020-04-26 ENCOUNTER — Other Ambulatory Visit: Payer: Self-pay

## 2020-04-26 NOTE — Patient Outreach (Signed)
[facility]) Care Management  04/26/2020  [name] Aug 21, 1932 [id number]   Telephone call to patient for follow up. No answer.  HIPAA compliant voice message left.    Plan: RN CM will attempt patient again within 4 business days and send letter.  [name], RN, MSN [facility] Care Management Care Management Coordinator Direct Line [phone] Cell [phone] Toll Free: [phone]  Fax: [phone]

## 2020-04-27 ENCOUNTER — Other Ambulatory Visit: Payer: Self-pay

## 2020-04-27 NOTE — Patient Outreach (Signed)
[facility]) Care Management  [facility] Care Manager  04/27/2020   [name] 1933/02/20 [id number]  Subjective: Telephone call to patient for follow up. Patient reports she is doing good.  She reports sugars are good with this morning reading 98.  Discussed importance of diabetic control.  Patient denies any fast heart rates.  Patient monitors blood pressure and pulse at home with last reading of 137/65, HR 80.  Encouraged patient to continue to monitor.  She verbalized understanding.    Objective:   Encounter Medications:  Outpatient Encounter Medications as of 04/27/2020  Medication Sig  . ACCU-CHEK SOFTCLIX LANCETS lancets   . atorvastatin (LIPITOR) 40 MG tablet Take 1 tablet (40 mg total) by mouth daily.  . cholecalciferol (VITAMIN D3) 25 MCG (1000 UNIT) tablet Take 1,000 Units by mouth daily.  . dabigatran (PRADAXA) 150 MG CAPS capsule Take 1 capsule (150 mg total) by mouth 2 (two) times daily.  Marland Kitchen diltiazem (CARDIZEM) 60 MG tablet Take 1 tablet (60 mg total) by mouth every 6 (six) hours.  . fish oil-omega-3 fatty acids 1000 MG capsule Take 1 g by mouth daily.  Marland Kitchen glipiZIDE (GLUCOTROL) 10 MG tablet Take 10 mg by mouth 2 (two) times daily before a meal.  . latanoprost (XALATAN) 0.005 % ophthalmic solution Place 1 drop into both eyes at bedtime.  Marland Kitchen linagliptin (TRADJENTA) 5 MG TABS tablet Take 1 tablet (5 mg total) by mouth daily.  Marland Kitchen lisinopril (PRINIVIL,ZESTRIL) 10 MG tablet TAKE 1 TABLET DAILY (Patient taking differently: Take 10 mg by mouth daily.)  . metFORMIN (GLUCOPHAGE) 500 MG tablet Take 1,000 mg by mouth 2 (two) times daily with a meal.  . metoprolol tartrate (LOPRESSOR) 50 MG tablet Take 1 tablet (50 mg total) by mouth 2 (two) times daily.  . TRUETEST TEST test strip   . amoxicillin (AMOXIL) 500 MG capsule Take 500 mg by mouth 3 (three) times daily. Start date : 02/09/20 (Patient not taking: Reported on 04/27/2020)  . [DISCONTINUED] iron polysaccharides  (NIFEREX) 150 MG capsule Take 150 mg by mouth daily.    . [DISCONTINUED] topiramate (TOPAMAX) 15 MG capsule Take 15 mg by mouth 2 (two) times daily.     No facility-administered encounter medications on file as of 04/27/2020.    Functional Status:  In your present state of health, do you have any difficulty performing the following activities: 04/27/2020 02/17/2020  Hearing? N N  Vision? N N  Difficulty concentrating or making decisions? N N  Walking or climbing stairs? N N  Dressing or bathing? N N  Doing errands, shopping? N N  Preparing Food and eating ? N N  Using the Toilet? N N  In the past six months, have you accidently leaked urine? N N  Do you have problems with loss of bowel control? N N  Managing your Medications? N N  Managing your Finances? N N  Housekeeping or managing your Housekeeping? N -  Some recent data might be hidden    Fall/Depression Screening: Fall Risk  04/27/2020 02/17/2020 01/31/2020  Falls in the past year? 0 0 0  Number falls in past yr: - - -  Injury with Fall? - - -   PHQ 2/9 Scores 04/27/2020 02/17/2020 01/31/2020 08/30/2019 05/07/2019 05/03/2019  PHQ - 2 Score 0 0 0 0 0 0    Assessment:  Patient managing chronic health conditions and following up with her physician as scheduled.   Goals Addressed  This Visit's Progress   . Monitor and Manage My Blood Sugar   On track    Timeframe: Long-Range Goal Priority:  High Start Date:    04/04/20                         Expected End Date:    08/12/20                  Follow Up Date 08/12/20   - check blood sugar at prescribed times - check blood sugar if I feel it is too high or too low - enter blood sugar readings and medication or insulin into daily log - take the blood sugar log to all doctor visits    Why is this important?   Checking your blood sugar at home helps to keep it from getting very high or very low.  Writing the results in a diary or log helps the doctor know how to care for  you.  Your blood sugar log should have the time, date and the results.  Also, write down the amount of insulin or other medicine that you take.  Other information, like what you ate, exercise done and how you were feeling, will also be helpful.     Notes:     . Track and Manage Heart Rate and Rhythm   On track    Timeframe:  Long-Range Goal Priority:  High Start Date:   04/04/20                          Expected End Date:08/12/20                    Follow Up Date 08/12/20   - check pulse (heart) rate once a day - make a plan to exercise regularly - make a plan to eat healthy - take medicine as prescribed    Why is this important?   Atrial fibrillation may have no symptoms. Sometimes the symptoms get worse or happen more often.  It is important to keep track of what your symptoms are and when they happen.  A change in symptoms is important to discuss with your doctor or nurse.  Being active and healthy eating will also help you manage your heart condition.     Notes:        Plan: RN CM will contact patient next month and patient agreeable.   RN CM will send update to physician.  Follow-up:  Patient agrees to Care Plan and Follow-up.   [name], RN, MSN [facility] Care Management Care Management Coordinator Direct Line [phone] Cell [phone] Toll Free: [phone]  Fax: [phone]

## 2020-04-27 NOTE — Patient Instructions (Signed)
Goals    . Monitor and Manage My Blood Sugar     Timeframe: Long-Range Goal Priority:  High Start Date:    04/04/20                         Expected End Date:    08/12/20                  Follow Up Date 08/12/20   - check blood sugar at prescribed times - check blood sugar if I feel it is too high or too low - enter blood sugar readings and medication or insulin into daily log - take the blood sugar log to all doctor visits    Why is this important?   Checking your blood sugar at home helps to keep it from getting very high or very low.  Writing the results in a diary or log helps the doctor know how to care for you.  Your blood sugar log should have the time, date and the results.  Also, write down the amount of insulin or other medicine that you take.  Other information, like what you ate, exercise done and how you were feeling, will also be helpful.     Notes:     . Track and Manage Heart Rate and Rhythm     Timeframe:  Long-Range Goal Priority:  High Start Date:   04/04/20                          Expected End Date:08/12/20                    Follow Up Date 08/12/20   - check pulse (heart) rate once a day - make a plan to exercise regularly - make a plan to eat healthy - take medicine as prescribed    Why is this important?   Atrial fibrillation may have no symptoms. Sometimes the symptoms get worse or happen more often.  It is important to keep track of what your symptoms are and when they happen.  A change in symptoms is important to discuss with your doctor or nurse.  Being active and healthy eating will also help you manage your heart condition.     Notes:

## 2020-05-02 ENCOUNTER — Telehealth (INDEPENDENT_AMBULATORY_CARE_PROVIDER_SITE_OTHER): Payer: Medicare HMO | Admitting: Cardiology

## 2020-05-02 ENCOUNTER — Encounter: Payer: Self-pay | Admitting: Cardiology

## 2020-05-02 VITALS — BP 121/79 | HR 75 | Ht 59.0 in | Wt 98.0 lb

## 2020-05-02 DIAGNOSIS — E785 Hyperlipidemia, unspecified: Secondary | ICD-10-CM

## 2020-05-02 DIAGNOSIS — I779 Disorder of arteries and arterioles, unspecified: Secondary | ICD-10-CM

## 2020-05-02 DIAGNOSIS — I10 Essential (primary) hypertension: Secondary | ICD-10-CM

## 2020-05-02 DIAGNOSIS — I480 Paroxysmal atrial fibrillation: Secondary | ICD-10-CM

## 2020-05-02 NOTE — Patient Instructions (Signed)
Medication Instructions:  ?Your physician recommends that you continue on your current medications as directed. Please refer to the Current Medication list given to you today. ? ?*If you need a refill on your cardiac medications before your next appointment, please call your pharmacy* ? ?Follow-Up: ?At [facility], you and your health needs are our priority.  As part of our continuing mission to provide you with exceptional heart care, we have created designated Provider Care Teams.  These Care Teams include your primary Cardiologist (physician) and Advanced Practice Providers (APPs -  Physician Assistants and Nurse Practitioners) who all work together to provide you with the care you need, when you need it. ? ?We recommend signing up for the patient portal called "MyChart".  Sign up information is provided on this After Visit Summary.  MyChart is used to connect with patients for Virtual Visits (Telemedicine).  Patients are able to view lab/test results, encounter notes, upcoming appointments, etc.  Non-urgent messages can be sent to your provider as well.   ?To learn more about what you can do with MyChart, go to [email or web address].   ? ?Your next appointment:   ?6 month(s) ? ?The format for your next appointment:   ?In Person ? ?Provider:   ?Brian Crenshaw, MD { ? ? ? ?

## 2020-05-08 NOTE — Telephone Encounter (Signed)
Received fax from [facility], the patient did not qualify for the program because she has medicaid. Patient made aware.

## 2020-05-11 ENCOUNTER — Other Ambulatory Visit: Payer: Self-pay

## 2020-06-06 ENCOUNTER — Other Ambulatory Visit: Payer: Self-pay

## 2020-06-06 NOTE — Patient Outreach (Signed)
[facility]) Care Management  06/06/2020  [name] 1932-05-04 [id number]   Telephone call to patient for disease management follow up.   No answer.  HIPAA compliant voice message left.    Plan: If no return call, RN CM will attempt patient again in the month of March.  [name], RN, MSN [facility] Care Management Care Management Coordinator Direct Line [phone] Cell [phone] Toll Free: [phone]  Fax: [phone]

## 2020-06-06 NOTE — Patient Outreach (Signed)
[facility]) Care Management  [facility] Care Manager  06/06/2020   [name] [birth year] [id number]  Subjective: Telephone call to patient for disease management follow up.PAtient reports doing well. Managing sugars and following up with physicians as scheduled.    Objective:   Encounter Medications:  Outpatient Encounter Medications as of 06/06/2020  Medication Sig  . ACCU-CHEK SOFTCLIX LANCETS lancets   . atorvastatin (LIPITOR) 40 MG tablet Take 1 tablet (40 mg total) by mouth daily.  . cholecalciferol (VITAMIN D3) 25 MCG (1000 UNIT) tablet Take 1,000 Units by mouth daily.  . dabigatran (PRADAXA) 150 MG CAPS capsule Take 1 capsule (150 mg total) by mouth 2 (two) times daily.  Marland Kitchen diltiazem (CARDIZEM) 60 MG tablet Take 1 tablet (60 mg total) by mouth every 6 (six) hours.  . fish oil-omega-3 fatty acids 1000 MG capsule Take 1 g by mouth daily.  Marland Kitchen glipiZIDE (GLUCOTROL) 10 MG tablet Take 10 mg by mouth 2 (two) times daily before a meal.  . latanoprost (XALATAN) 0.005 % ophthalmic solution Place 1 drop into both eyes at bedtime.  Marland Kitchen linagliptin (TRADJENTA) 5 MG TABS tablet Take 1 tablet (5 mg total) by mouth daily.  Marland Kitchen lisinopril (PRINIVIL,ZESTRIL) 10 MG tablet TAKE 1 TABLET DAILY (Patient taking differently: Take 10 mg by mouth daily.)  . metFORMIN (GLUCOPHAGE) 500 MG tablet Take 1,000 mg by mouth 2 (two) times daily with a meal.  . metoprolol tartrate (LOPRESSOR) 50 MG tablet Take 1 tablet (50 mg total) by mouth 2 (two) times daily.  . TRUETEST TEST test strip   . [DISCONTINUED] iron polysaccharides (NIFEREX) 150 MG capsule Take 150 mg by mouth daily.    . [DISCONTINUED] topiramate (TOPAMAX) 15 MG capsule Take 15 mg by mouth 2 (two) times daily.     No facility-administered encounter medications on file as of 06/06/2020.    Functional Status:  In your present state of health, do you have any difficulty performing the following activities: 04/27/2020 02/17/2020  Hearing?  N N  Vision? N N  Difficulty concentrating or making decisions? N N  Walking or climbing stairs? N N  Dressing or bathing? N N  Doing errands, shopping? N N  Preparing Food and eating ? N N  Using the Toilet? N N  In the past six months, have you accidently leaked urine? N N  Do you have problems with loss of bowel control? N N  Managing your Medications? N N  Managing your Finances? N N  Housekeeping or managing your Housekeeping? N -  Some recent data might be hidden    Fall/Depression Screening: Fall Risk  04/27/2020 02/17/2020 01/31/2020  Falls in the past year? 0 0 0  Number falls in past yr: - - -  Injury with Fall? - - -   PHQ 2/9 Scores 04/27/2020 02/17/2020 01/31/2020 08/30/2019 05/07/2019 05/03/2019  PHQ - 2 Score 0 0 0 0 0 0    Assessment:  Goals Addressed            This Visit's Progress   . Monitor and Manage My Blood Sugar   On track    Timeframe: Long-Range Goal Priority:  High Start Date:    04/04/20                         Expected End Date:    12/13/20                Follow Up Date 08/12/20   -  check blood sugar if I feel it is too high or too low - take the blood sugar log to all doctor visits    Why is this important?   Checking your blood sugar at home helps to keep it from getting very high or very low.  Writing the results in a diary or log helps the doctor know how to care for you.  Your blood sugar log should have the time, date and the results.  Also, write down the amount of insulin or other medicine that you take.  Other information, like what you ate, exercise done and how you were feeling, will also be helpful.     Notes:     . Track and Manage Heart Rate and Rhythm   On track    Timeframe:  Long-Range Goal Priority:  High Start Date:   04/04/20                          Expected End Date:12/13/20                    Follow Up Date 08/12/20   - make a plan to eat healthy    Why is this important?   Atrial fibrillation may have no  symptoms. Sometimes the symptoms get worse or happen more often.  It is important to keep track of what your symptoms are and when they happen.  A change in symptoms is important to discuss with your doctor or nurse.  Being active and healthy eating will also help you manage your heart condition.     Notes:        Plan: RN CM will follow up in April.   Follow-up:  Patient agrees to Care Plan and Follow-up.   [name], RN, MSN [facility] Care Management Care Management Coordinator Direct Line [phone] Cell [phone] Toll Free: [phone]  Fax: [phone]

## 2020-06-06 NOTE — Patient Instructions (Signed)
Goals Addressed            This Visit's Progress   . Monitor and Manage My Blood Sugar   On track    Timeframe: Long-Range Goal Priority:  High Start Date:    04/04/20                         Expected End Date:    12/13/20                Follow Up Date 08/12/20   - check blood sugar if I feel it is too high or too low - take the blood sugar log to all doctor visits    Why is this important?   Checking your blood sugar at home helps to keep it from getting very high or very low.  Writing the results in a diary or log helps the doctor know how to care for you.  Your blood sugar log should have the time, date and the results.  Also, write down the amount of insulin or other medicine that you take.  Other information, like what you ate, exercise done and how you were feeling, will also be helpful.     Notes:     . Track and Manage Heart Rate and Rhythm   On track    Timeframe:  Long-Range Goal Priority:  High Start Date:   04/04/20                          Expected End Date:12/13/20                    Follow Up Date 08/12/20   - make a plan to eat healthy    Why is this important?   Atrial fibrillation may have no symptoms. Sometimes the symptoms get worse or happen more often.  It is important to keep track of what your symptoms are and when they happen.  A change in symptoms is important to discuss with your doctor or nurse.  Being active and healthy eating will also help you manage your heart condition.     Notes:

## 2020-06-12 DIAGNOSIS — E1169 Type 2 diabetes mellitus with other specified complication: Secondary | ICD-10-CM | POA: Diagnosis not present

## 2020-06-12 DIAGNOSIS — E7849 Other hyperlipidemia: Secondary | ICD-10-CM | POA: Diagnosis not present

## 2020-06-12 DIAGNOSIS — I480 Paroxysmal atrial fibrillation: Secondary | ICD-10-CM | POA: Diagnosis not present

## 2020-06-12 DIAGNOSIS — I10 Essential (primary) hypertension: Secondary | ICD-10-CM | POA: Diagnosis not present

## 2020-06-15 DIAGNOSIS — R6889 Other general symptoms and signs: Secondary | ICD-10-CM | POA: Diagnosis not present

## 2020-06-16 DIAGNOSIS — E7849 Other hyperlipidemia: Secondary | ICD-10-CM | POA: Diagnosis not present

## 2020-06-16 DIAGNOSIS — I6789 Other cerebrovascular disease: Secondary | ICD-10-CM | POA: Diagnosis not present

## 2020-06-16 DIAGNOSIS — F0280 Dementia in other diseases classified elsewhere without behavioral disturbance: Secondary | ICD-10-CM | POA: Diagnosis not present

## 2020-06-16 DIAGNOSIS — E1169 Type 2 diabetes mellitus with other specified complication: Secondary | ICD-10-CM | POA: Diagnosis not present

## 2020-06-16 DIAGNOSIS — I10 Essential (primary) hypertension: Secondary | ICD-10-CM | POA: Diagnosis not present

## 2020-06-16 DIAGNOSIS — I480 Paroxysmal atrial fibrillation: Secondary | ICD-10-CM | POA: Diagnosis not present

## 2020-06-26 DIAGNOSIS — H40012 Open angle with borderline findings, low risk, left eye: Secondary | ICD-10-CM | POA: Diagnosis not present

## 2020-06-26 DIAGNOSIS — H401210 Low-tension glaucoma, right eye, stage unspecified: Secondary | ICD-10-CM | POA: Diagnosis not present

## 2020-07-03 DIAGNOSIS — L84 Corns and callosities: Secondary | ICD-10-CM | POA: Diagnosis not present

## 2020-07-03 DIAGNOSIS — E1151 Type 2 diabetes mellitus with diabetic peripheral angiopathy without gangrene: Secondary | ICD-10-CM | POA: Diagnosis not present

## 2020-07-03 DIAGNOSIS — L603 Nail dystrophy: Secondary | ICD-10-CM | POA: Diagnosis not present

## 2020-07-03 DIAGNOSIS — I739 Peripheral vascular disease, unspecified: Secondary | ICD-10-CM | POA: Diagnosis not present

## 2020-07-03 DIAGNOSIS — R6889 Other general symptoms and signs: Secondary | ICD-10-CM | POA: Diagnosis not present

## 2020-07-04 ENCOUNTER — Ambulatory Visit: Payer: Self-pay

## 2020-07-05 ENCOUNTER — Other Ambulatory Visit: Payer: Self-pay

## 2020-07-05 NOTE — Patient Instructions (Signed)
Goals Addressed            This Visit's Progress   . Monitor and Manage My Blood Sugar   On track    Timeframe: Long-Range Goal Priority:  High Start Date:    04/04/20                         Expected End Date:    12/13/20                Follow Up Date 08/12/20   - check blood sugar at prescribed times - take the blood sugar meter to all doctor visits    Why is this important?   Checking your blood sugar at home helps to keep it from getting very high or very low.  Writing the results in a diary or log helps the doctor know how to care for you.  Your blood sugar log should have the time, date and the results.  Also, write down the amount of insulin or other medicine that you take.  Other information, like what you ate, exercise done and how you were feeling, will also be helpful.     Notes:     . Track and Manage Heart Rate and Rhythm   On track    Timeframe:  Long-Range Goal Priority:  High Start Date:   04/04/20                          Expected End Date:12/13/20                    Follow Up Date 09/11/20   - check pulse (heart) rate once a day - take medicine as prescribed    Why is this important?   Atrial fibrillation may have no symptoms. Sometimes the symptoms get worse or happen more often.  It is important to keep track of what your symptoms are and when they happen.  A change in symptoms is important to discuss with your doctor or nurse.  Being active and healthy eating will also help you manage your heart condition.     Notes:

## 2020-07-05 NOTE — Patient Outreach (Signed)
[facility]) Care Management  [facility] Care Manager  07/05/2020   [name] [birth year] [id number]  Subjective: Telephone call from patient. She reports she was not sure if she missed my call.    Patient has support local from friends and church family.  Patient reports doing good.  Blood sugar this am 165. Discussed diet and control.  She verbalized understanding.     Objective:   Encounter Medications:  Outpatient Encounter Medications as of 07/05/2020  Medication Sig  . ACCU-CHEK SOFTCLIX LANCETS lancets   . atorvastatin (LIPITOR) 40 MG tablet Take 1 tablet (40 mg total) by mouth daily.  . cholecalciferol (VITAMIN D3) 25 MCG (1000 UNIT) tablet Take 1,000 Units by mouth daily.  . dabigatran (PRADAXA) 150 MG CAPS capsule Take 1 capsule (150 mg total) by mouth 2 (two) times daily.  Marland Kitchen diltiazem (CARDIZEM) 60 MG tablet Take 1 tablet (60 mg total) by mouth every 6 (six) hours.  . fish oil-omega-3 fatty acids 1000 MG capsule Take 1 g by mouth daily.  Marland Kitchen glipiZIDE (GLUCOTROL) 10 MG tablet Take 10 mg by mouth 2 (two) times daily before a meal.  . latanoprost (XALATAN) 0.005 % ophthalmic solution Place 1 drop into both eyes at bedtime.  Marland Kitchen linagliptin (TRADJENTA) 5 MG TABS tablet Take 1 tablet (5 mg total) by mouth daily.  Marland Kitchen lisinopril (PRINIVIL,ZESTRIL) 10 MG tablet TAKE 1 TABLET DAILY (Patient taking differently: Take 10 mg by mouth daily.)  . metFORMIN (GLUCOPHAGE) 500 MG tablet Take 1,000 mg by mouth 2 (two) times daily with a meal.  . metoprolol tartrate (LOPRESSOR) 50 MG tablet Take 1 tablet (50 mg total) by mouth 2 (two) times daily.  . TRUETEST TEST test strip   . [DISCONTINUED] iron polysaccharides (NIFEREX) 150 MG capsule Take 150 mg by mouth daily.    . [DISCONTINUED] topiramate (TOPAMAX) 15 MG capsule Take 15 mg by mouth 2 (two) times daily.     No facility-administered encounter medications on file as of 07/05/2020.    Functional Status:  In your present  state of health, do you have any difficulty performing the following activities: 04/27/2020 02/17/2020  Hearing? N N  Vision? N N  Difficulty concentrating or making decisions? N N  Walking or climbing stairs? N N  Dressing or bathing? N N  Doing errands, shopping? N N  Preparing Food and eating ? N N  Using the Toilet? N N  In the past six months, have you accidently leaked urine? N N  Do you have problems with loss of bowel control? N N  Managing your Medications? N N  Managing your Finances? N N  Housekeeping or managing your Housekeeping? N -  Some recent data might be hidden    Fall/Depression Screening: Fall Risk  04/27/2020 02/17/2020 01/31/2020  Falls in the past year? 0 0 0  Number falls in past yr: - - -  Injury with Fall? - - -   PHQ 2/9 Scores 04/27/2020 02/17/2020 01/31/2020 08/30/2019 05/07/2019 05/03/2019  PHQ - 2 Score 0 0 0 0 0 0    Assessment:  Goals Addressed            This Visit's Progress   . Monitor and Manage My Blood Sugar   On track    Timeframe: Long-Range Goal Priority:  High Start Date:    04/04/20  Expected End Date:    12/13/20                Follow Up Date 08/12/20   - check blood sugar at prescribed times - take the blood sugar meter to all doctor visits    Why is this important?   Checking your blood sugar at home helps to keep it from getting very high or very low.  Writing the results in a diary or log helps the doctor know how to care for you.  Your blood sugar log should have the time, date and the results.  Also, write down the amount of insulin or other medicine that you take.  Other information, like what you ate, exercise done and how you were feeling, will also be helpful.     Notes:     . Track and Manage Heart Rate and Rhythm   On track    Timeframe:  Long-Range Goal Priority:  High Start Date:   04/04/20                          Expected End Date:12/13/20                    Follow Up Date 09/11/20   -  check pulse (heart) rate once a day - take medicine as prescribed    Why is this important?   Atrial fibrillation may have no symptoms. Sometimes the symptoms get worse or happen more often.  It is important to keep track of what your symptoms are and when they happen.  A change in symptoms is important to discuss with your doctor or nurse.  Being active and healthy eating will also help you manage your heart condition.     Notes:        Plan: RN CM will contact patient in May. Follow-up:  Patient agrees to Care Plan and Follow-up.   [name], RN, MSN [facility] Care Management Care Management Coordinator Direct Line [phone] Cell [phone] Toll Free: [phone]  Fax: [phone]

## 2020-07-10 DIAGNOSIS — R6889 Other general symptoms and signs: Secondary | ICD-10-CM | POA: Diagnosis not present

## 2020-07-10 DIAGNOSIS — H401211 Low-tension glaucoma, right eye, mild stage: Secondary | ICD-10-CM | POA: Diagnosis not present

## 2020-07-13 DIAGNOSIS — I10 Essential (primary) hypertension: Secondary | ICD-10-CM | POA: Diagnosis not present

## 2020-07-13 DIAGNOSIS — E7849 Other hyperlipidemia: Secondary | ICD-10-CM | POA: Diagnosis not present

## 2020-07-17 DIAGNOSIS — I739 Peripheral vascular disease, unspecified: Secondary | ICD-10-CM | POA: Diagnosis not present

## 2020-07-17 DIAGNOSIS — I10 Essential (primary) hypertension: Secondary | ICD-10-CM | POA: Diagnosis not present

## 2020-07-17 DIAGNOSIS — I4891 Unspecified atrial fibrillation: Secondary | ICD-10-CM | POA: Diagnosis not present

## 2020-07-17 DIAGNOSIS — E782 Mixed hyperlipidemia: Secondary | ICD-10-CM | POA: Diagnosis not present

## 2020-07-24 ENCOUNTER — Encounter: Payer: Self-pay | Admitting: Psychology

## 2020-08-02 ENCOUNTER — Ambulatory Visit: Payer: Self-pay

## 2020-08-10 ENCOUNTER — Other Ambulatory Visit: Payer: Self-pay

## 2020-08-10 NOTE — Patient Outreach (Signed)
[facility]) Care Management  08/10/2020  [name] 1932/07/31 [id number]   Return phone call to patient. No answer.  HIPAA compliant voice message left.  Plan: RN CM will wait return call.  If no return call will outreach as scheduled.    [name], RN, MSN [facility] Care Management Care Management Coordinator Direct Line [phone] Cell [phone] Toll Free: [phone]  Fax: [phone]

## 2020-09-01 DIAGNOSIS — H40012 Open angle with borderline findings, low risk, left eye: Secondary | ICD-10-CM | POA: Diagnosis not present

## 2020-09-01 DIAGNOSIS — H401210 Low-tension glaucoma, right eye, stage unspecified: Secondary | ICD-10-CM | POA: Diagnosis not present

## 2020-09-04 ENCOUNTER — Other Ambulatory Visit: Payer: Self-pay

## 2020-09-04 NOTE — Patient Outreach (Signed)
[facility]) Care Management  09/04/2020  MALENA TIMPONE 02/15/1933 [id number]   Telephone call to patient for disease management follow up.   No answer.  HIPAA compliant voice message left.    Plan: If no return call, RN CM will attempt patient again in June.     [name], RN, MSN [facility] Care Management Care Management Coordinator Direct Line [phone] Cell [phone] Toll Free: [phone]  Fax: [phone]

## 2020-09-06 ENCOUNTER — Ambulatory Visit: Payer: Self-pay

## 2020-09-10 ENCOUNTER — Other Ambulatory Visit: Payer: Self-pay

## 2020-09-10 ENCOUNTER — Emergency Department (HOSPITAL_COMMUNITY)
Admission: EM | Admit: 2020-09-10 | Discharge: 2020-09-10 | Disposition: A | Discharge status: Home / Self Care | Payer: Medicare HMO | Attending: Emergency Medicine | Admitting: Emergency Medicine

## 2020-09-10 ENCOUNTER — Encounter (HOSPITAL_COMMUNITY): Payer: Self-pay

## 2020-09-10 DIAGNOSIS — E1165 Type 2 diabetes mellitus with hyperglycemia: Secondary | ICD-10-CM | POA: Diagnosis not present

## 2020-09-10 DIAGNOSIS — Z7984 Long term (current) use of oral hypoglycemic drugs: Secondary | ICD-10-CM | POA: Insufficient documentation

## 2020-09-10 DIAGNOSIS — I10 Essential (primary) hypertension: Secondary | ICD-10-CM | POA: Diagnosis not present

## 2020-09-10 DIAGNOSIS — R739 Hyperglycemia, unspecified: Secondary | ICD-10-CM

## 2020-09-10 DIAGNOSIS — Z79899 Other long term (current) drug therapy: Secondary | ICD-10-CM | POA: Diagnosis not present

## 2020-09-10 LAB — CBC WITH DIFFERENTIAL/PLATELET
Abs Immature Granulocytes: 0.01 10*3/uL (ref 0.00–0.07)
Basophils Absolute: 0 10*3/uL (ref 0.0–0.1)
Basophils Relative: 0 %
Eosinophils Absolute: 0 10*3/uL (ref 0.0–0.5)
Eosinophils Relative: 1 %
HCT: 41.8 % (ref 36.0–46.0)
Hemoglobin: 13.8 g/dL (ref 12.0–15.0)
Immature Granulocytes: 0 %
Lymphocytes Relative: 18 %
Lymphs Abs: 1 10*3/uL (ref 0.7–4.0)
MCH: 32.7 pg (ref 26.0–34.0)
MCHC: 33 g/dL (ref 30.0–36.0)
MCV: 99.1 fL (ref 80.0–100.0)
Monocytes Absolute: 0.5 10*3/uL (ref 0.1–1.0)
Monocytes Relative: 9 %
Neutro Abs: 4 10*3/uL (ref 1.7–7.7)
Neutrophils Relative %: 72 %
Platelets: 183 10*3/uL (ref 150–400)
RBC: 4.22 MIL/uL (ref 3.87–5.11)
RDW: 15.4 % (ref 11.5–15.5)
WBC: 5.5 10*3/uL (ref 4.0–10.5)
nRBC: 0 % (ref 0.0–0.2)

## 2020-09-10 LAB — COMPREHENSIVE METABOLIC PANEL
ALT: 181 U/L — ABNORMAL HIGH (ref 0–44)
AST: 149 U/L — ABNORMAL HIGH (ref 15–41)
Albumin: 5.1 g/dL — ABNORMAL HIGH (ref 3.5–5.0)
Alkaline Phosphatase: 98 U/L (ref 38–126)
Anion gap: 11 (ref 5–15)
BUN: 19 mg/dL (ref 8–23)
CO2: 31 mmol/L (ref 22–32)
Calcium: 10.2 mg/dL (ref 8.9–10.3)
Chloride: 97 mmol/L — ABNORMAL LOW (ref 98–111)
Creatinine, Ser: 0.71 mg/dL (ref 0.44–1.00)
GFR, Estimated: >60 mL/min (ref >60)
Glucose, Bld: 217 mg/dL — ABNORMAL HIGH (ref 70–99)
Potassium: 3.8 mmol/L (ref 3.5–5.1)
Sodium: 139 mmol/L (ref 135–145)
Total Bilirubin: 0.5 mg/dL (ref 0.3–1.2)
Total Protein: 8.9 g/dL — ABNORMAL HIGH (ref 6.5–8.1)

## 2020-09-10 LAB — URINALYSIS, ROUTINE W REFLEX MICROSCOPIC
Bacteria, UA: NONE SEEN
Bilirubin Urine: NEGATIVE
Glucose, UA: >=500 mg/dL — AB
Hgb urine dipstick: NEGATIVE
Ketones, ur: NEGATIVE mg/dL
Nitrite: NEGATIVE
Protein, ur: NEGATIVE mg/dL
Specific Gravity, Urine: 1.005 (ref 1.005–1.030)
pH: 9 — ABNORMAL HIGH (ref 5.0–8.0)

## 2020-09-10 LAB — CBG MONITORING, ED: Glucose-Capillary: 209 mg/dL — ABNORMAL HIGH (ref 70–99)

## 2020-09-10 MED ORDER — SODIUM CHLORIDE 0.9 % IV BOLUS
500.0000 mL | Freq: Once | INTRAVENOUS | Status: AC
  Administered 2020-09-10: 500 mL via INTRAVENOUS

## 2020-09-10 NOTE — ED Provider Notes (Addendum)
[facility] DEPT Provider Note   CSN: [id number] Arrival date & time: 09/10/20  0855     History Chief Complaint  Patient presents with  . Hyperglycemia    [name] is a 87 y.o. female.  HPI   Patient is an 87 year old female with a history of atrial fibrillation, DVT, hyperlipidemia, hypertension, CVA, PAD, diabetes mellitus, anticoagulated on Pradaxa who presents to the emergency department due to hyperglycemia.  Patient states that she has been checking her blood sugar at home and for the past 2 weeks it has been between 160-180.  Her highest reading was today and was 203.  She has been taking her medications as prescribed.  She takes Tradjenta as well as glipizide for diabetes mellitus.  She reports some mild xerostomia as well as an episode of nausea this morning.  No vomiting.  No chest pain or shortness of breath.  She has no other complaints.  No recent illnesses.  She lives alone but states that her daughters live close by and help care for her daily.     Past Medical History:  Diagnosis Date  . Atrial fibrillation ([place])   . Diabetes mellitus   . DVT (deep venous thrombosis) ([place])   . Hyperlipidemia   . Hypertension   . Leg pain   . Peripheral arterial disease ([place])   . Stroke [facility]) 1999    Patient Active Problem List   Diagnosis Date Noted  . Need for prophylactic antibiotic 02/14/2020  . Atrial flutter with rapid ventricular response ([place]) 02/13/2020  . Hypomagnesemia 02/13/2020  . Atrial flutter ([place]) 02/12/2020  . Cramp of both lower extremities 12/15/2014  . Foot cramps 12/15/2014  . Stiffness of foot 06/10/2014  . Aftercare following surgery of the circulatory system, [place] 12/09/2013  . Peripheral vascular disease ([place]) 10/03/2011  . 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 Chronic total occlusion of artery of the extremities ([place]) 07/04/2011  . PURE HYPERCHOLESTEROLEMIA 03/29/2009  . ESSENTIAL HYPERTENSION,  [place] 03/29/2009  . Essential hypertension 03/28/2009  . Type 2 diabetes mellitus with hyperlipidemia ([place]) 08/30/2008  . HYPERLIPIDEMIA-MIXED 08/30/2008  . ATRIAL FIBRILLATION 08/30/2008  . SYNCOPE AND COLLAPSE 08/30/2008    Past Surgical History:  Procedure Laterality Date  . BACK SURGERY  1996  . CARDIOVERSION N/A 02/15/2020   Procedure: CARDIOVERSION;  Surgeon: Acie Fredrickson Wonda Cheng, MD;  Location: [facility];  Service: Cardiovascular;  Laterality: N/A;  . CATARACT EXTRACTION  2000  . CESAREAN SECTION    . EYE SURGERY    . FEMORAL BYPASS Left July 23, 2010  . LUMBAR DISC SURGERY    . SPINE SURGERY       OB History   No obstetric history on file.     Family History  Problem Relation Age of Onset  . Hypertension Mother   . Heart attack Mother   . Diabetes Father   . Hypertension Father   . Stroke Father   . Stroke Sister   . Diabetes Sister   . Heart disease Sister        Before age 72-Aneurysm  . Dementia Sister   . Alzheimer's disease Sister   . Diabetes Brother   . Diabetes Brother   . Heart disease Brother        Before age 19  . Heart attack Brother   . Heart attack Sister   . Diabetes Sister   . Alzheimer's disease Sister     Social History   Tobacco Use  .  Smoking status: Never Smoker  . Smokeless tobacco: Never Used  Vaping Use  . Vaping Use: Never used  Substance Use Topics  . Alcohol use: No  . Drug use: No    Home Medications Prior to Admission medications   Medication Sig Start Date End Date Taking? Authorizing Provider  ACCU-CHEK SOFTCLIX LANCETS lancets  01/16/18   [provider]  atorvastatin (LIPITOR) 40 MG tablet Take 1 tablet (40 mg total) by mouth daily. 03/22/20 06/20/20  Deberah Pelton, NP  cholecalciferol (VITAMIN D3) 25 MCG (1000 UNIT) tablet Take 1,000 Units by mouth daily.    [provider]  dabigatran (PRADAXA) 150 MG CAPS capsule Take 1 capsule (150 mg total) by mouth 2 (two) times daily. 04/23/19    Lelon Perla, MD  diltiazem (CARDIZEM) 60 MG tablet Take 1 tablet (60 mg total) by mouth every 6 (six) hours. 02/16/20   Aline August, MD  fish oil-omega-3 fatty acids 1000 MG capsule Take 1 g by mouth daily.    [provider]  glipiZIDE (GLUCOTROL) 10 MG tablet Take 10 mg by mouth 2 (two) times daily before a meal.    [provider]  latanoprost (XALATAN) 0.005 % ophthalmic solution Place 1 drop into both eyes at bedtime.    [provider]  linagliptin (TRADJENTA) 5 MG TABS tablet Take 1 tablet (5 mg total) by mouth daily. 05/20/17   Lelon Perla, MD  lisinopril (PRINIVIL,ZESTRIL) 10 MG tablet TAKE 1 TABLET DAILY Patient taking differently: Take 10 mg by mouth daily. 12/03/11   Lelon Perla, MD  metFORMIN (GLUCOPHAGE) 500 MG tablet Take 1,000 mg by mouth 2 (two) times daily with a meal.    [provider]  metoprolol tartrate (LOPRESSOR) 50 MG tablet Take 1 tablet (50 mg total) by mouth 2 (two) times daily. 02/16/20   Aline August, MD  TRUETEST TEST test strip  02/22/13   [provider]  iron polysaccharides (NIFEREX) 150 MG capsule Take 150 mg by mouth daily.    07/04/11  [provider]  topiramate (TOPAMAX) 15 MG capsule Take 15 mg by mouth 2 (two) times daily.    07/04/11  [provider]    Allergies    Patient has no known allergies.  Review of Systems   Review of Systems  All other systems reviewed and are negative. Ten systems reviewed and are negative for acute change, except as noted in the HPI.   Physical Exam Updated Vital Signs BP (!) 189/70   Pulse 65   Temp (!) 97.5 F (36.4 C) (Oral)   Resp 12   Ht _0  (1.499 m)   Wt 41.7 kg   SpO2 100%   BMI 18.58 kg/m   Physical Exam Vitals and nursing note reviewed.  Constitutional:      General: She is not in acute distress.    Appearance: Normal appearance. She is not ill-appearing, toxic-appearing or diaphoretic.     Comments: Sarcopenic  elderly adult female.  Sitting upright and speaking clearly and coherently.  HENT:     Head: Normocephalic and atraumatic.     Right Ear: External ear normal.     Left Ear: External ear normal.     Nose: Nose normal.     Mouth/Throat:     Mouth: Mucous membranes are moist.     Pharynx: Oropharynx is clear. No oropharyngeal exudate or posterior oropharyngeal erythema.  Eyes:     Extraocular Movements: Extraocular movements intact.  Cardiovascular:     Rate and Rhythm: Normal rate and regular rhythm.     Pulses: Normal pulses.     Heart sounds: Normal heart sounds. No murmur heard. No friction rub. No gallop.      Comments: RRR without M/R/G. Pulmonary:     Effort: Pulmonary effort is normal. No respiratory distress.     Breath sounds: Normal breath sounds. No stridor. No wheezing, rhonchi or rales.     Comments: LCTAB. Abdominal:     General: Abdomen is flat.     Palpations: Abdomen is soft.     Tenderness: There is no abdominal tenderness.     Comments: Abdomen is flat, soft, and nontender.  Musculoskeletal:        General: Normal range of motion.     Cervical back: Normal range of motion and neck supple. No tenderness.  Skin:    General: Skin is warm and dry.  Neurological:     General: No focal deficit present.     Mental Status: She is alert and oriented to person, place, and time.  Psychiatric:        Mood and Affect: Mood normal.        Behavior: Behavior normal.    ED Results / Procedures / Treatments   Labs (all labs ordered are listed, but only abnormal results are displayed) Labs Reviewed  COMPREHENSIVE METABOLIC PANEL - Abnormal; Notable for the following components:      Result Value   Chloride 97 (*)    Glucose, Bld 217 (*)    Total Protein 8.9 (*)    Albumin 5.1 (*)    AST 149 (*)    ALT 181 (*)    All other components within normal limits  URINALYSIS, ROUTINE W REFLEX MICROSCOPIC - Abnormal; Notable for the following components:   Color, Urine STRAW  (*)    pH 9.0 (*)    Glucose, UA >=500 (*)    Leukocytes,Ua TRACE (*)    All other components within normal limits  CBG MONITORING, ED - Abnormal; Notable for the following components:   Glucose-Capillary 209 (*)    All other components within normal limits  CBC WITH DIFFERENTIAL/PLATELET    EKG None  Radiology No results found.  Procedures Procedures   Medications Ordered in ED Medications  sodium chloride 0.9 % bolus 500 mL (500 mLs Intravenous New Bag/Given 09/10/20 1100)    ED Course  I have reviewed the triage vital signs and the nursing notes.  Pertinent labs & imaging results that were available during my care of the patient were reviewed by me and considered in my medical decision making (see chart for details).    MDM Rules/Calculators/A&P                          Pt is a 87 y.o. female who presents to the ED d/t hyperglycemia.   Labs: CBC with differential without abnormalities. CMP with a chloride of 97, glucose of 217, total protein of 8.9, albumin of 5.1, AST of 149, ALT of 181. UA shows pH of 9, glucose 500, trace leukocytes.  No ketonuria.  I, Rayna Sexton, PA-C, personally reviewed and evaluated these images and lab results as part of my medical decision-making.  Patient presents to the emergency department with her daughter due to hyperglycemia.  Blood sugars have been ranging between 160-200 at home.  Has been taking her appropriate home medications.  Denies any missed doses.  Physical exam is reassuring.  Patient has no physical complaints at this time.  She did note an episode of nausea this morning but no vomiting.  No current nausea.  Abdomen is soft and nontender.  Lab work is reassuring as well.  No leukocytosis on CBC.  Glucose was 217 with a mild hypochloremia of 97.  Patient was given 500 cc of normal saline.  UA negative for ketones.  No elevation in anion gap.  Doubt DKA.  Patient did have elevation in her LFTs with an AST of 149 and an  ALT of 181.  Unsure of the cause.  Possibly secondary to her glipizide use.  Alk phos and total bilirubin within normal limits.  Abdomen was soft and nontender on my exam.  This was discussed with patient's daughter and I recommended she follow-up with her PCP regarding this and have her LFTs rechecked.  Recommended patient continue checking her blood sugars daily and take her normal home regimen.  Discussed return precautions in length with her daughter.  Recommended PCP follow-up next week regarding her hyperglycemia.  Feel the patient is stable for discharge at this time and both she and her daughter are agreeable.  Their questions were answered and they were amicable at the time of discharge.  Note: Portions of this report may have been transcribed using voice recognition software. Every effort was made to ensure accuracy; however, inadvertent computerized transcription errors may be present.    Final Clinical Impression(s) / ED Diagnoses Final diagnoses:  Hyperglycemia    Rx / DC Orders ED Discharge Orders    None       Rayna Sexton, PA-C 09/10/20 1246    Malvin Johns, MD 09/10/20 1407    Rayna Sexton, PA-C 09/10/20 1414    Malvin Johns, MD 09/10/20 1423

## 2020-09-10 NOTE — Discharge Instructions (Signed)
Please continue to monitor [name]'s blood sugar.  If you find that it is increasing and she is developing symptoms such as nausea, vomiting, fatigue, confusion, please bring her back to the emergency department immediately for reevaluation.  Otherwise, please follow-up with her regular doctor next week for reevaluation.  It was a pleasure to meet you both.

## 2020-09-10 NOTE — ED Triage Notes (Signed)
Pt presents to ED c/o CBG 1602-180s x at least 2 weeks. 5/29 reading of 203. Taking glipizide 10mg  BID. Denies abdominal pain, polyuria, polydipsia, polyphagia. No other complaints.

## 2020-09-20 DIAGNOSIS — E1169 Type 2 diabetes mellitus with other specified complication: Secondary | ICD-10-CM | POA: Diagnosis not present

## 2020-09-20 DIAGNOSIS — I10 Essential (primary) hypertension: Secondary | ICD-10-CM | POA: Diagnosis not present

## 2020-09-20 DIAGNOSIS — F0280 Dementia in other diseases classified elsewhere without behavioral disturbance: Secondary | ICD-10-CM | POA: Diagnosis not present

## 2020-09-20 DIAGNOSIS — I4891 Unspecified atrial fibrillation: Secondary | ICD-10-CM | POA: Diagnosis not present

## 2020-09-22 ENCOUNTER — Other Ambulatory Visit: Payer: Self-pay

## 2020-09-22 NOTE — Patient Outreach (Signed)
[facility]) Care Management  09/22/2020  [name] [birth year] [id number]   Telephone call to patient for follow up nurse line.  No answer. HIPAA compliant voice message left.    Plan: RN CM will attempt again as scheduled.    [name], RN, MSN [facility] Care Management Care Management Coordinator Direct Line [phone] Cell [phone] Toll Free: [phone]  Fax: [phone]

## 2020-10-04 ENCOUNTER — Other Ambulatory Visit: Payer: Self-pay

## 2020-10-04 DIAGNOSIS — I739 Peripheral vascular disease, unspecified: Secondary | ICD-10-CM | POA: Diagnosis not present

## 2020-10-04 DIAGNOSIS — E1151 Type 2 diabetes mellitus with diabetic peripheral angiopathy without gangrene: Secondary | ICD-10-CM | POA: Diagnosis not present

## 2020-10-04 DIAGNOSIS — L84 Corns and callosities: Secondary | ICD-10-CM | POA: Diagnosis not present

## 2020-10-04 DIAGNOSIS — L603 Nail dystrophy: Secondary | ICD-10-CM | POA: Diagnosis not present

## 2020-10-04 NOTE — Patient Outreach (Signed)
[facility]) Care Management  10/04/2020  [name] April 08, 1933 [id number]   Telephone call to patient for disease management follow up.   No answer.  HIPAA compliant voice message left.    Plan: If no return call, RN CM will attempt patient again in the month of July.  [name], RN, MSN [facility] Care Management Care Management Coordinator Direct Line [phone] Cell [phone] Toll Free: [phone]  Fax: [phone]

## 2020-10-05 ENCOUNTER — Other Ambulatory Visit: Payer: Self-pay

## 2020-10-05 NOTE — Patient Outreach (Signed)
[facility]) Care Management  10/05/2020  [name] Apr 04, 1933 [id number]   Return call to patient for follow up. No answer.  HIPAA compliant voice message left.    Plan: RN CM will attempt again as previously scheduled.   [name], RN, MSN [facility] Care Management Care Management Coordinator Direct Line [phone] Cell [phone] Toll Free: [phone]  Fax: [phone]

## 2020-10-06 ENCOUNTER — Other Ambulatory Visit: Payer: Self-pay

## 2020-10-06 NOTE — Patient Outreach (Signed)
[facility]) Care Management  [facility] Care Manager  10/06/2020   [name] 19-Oct-1932 [id number]  Subjective: Telephone from patient and caregiver [name].  Patient states she is ok but sugars trending higher in the 200's.  She is on a trial of Rybelsus and to see the doctor again on 10-18-20.  Discussed diabetes management and importance of limiting carbohydrates.  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  Discussed medicaid and pcs.  She states patient's income too high.  Discussed private pay as an option.  She verbalized understanding.      Objective:   Encounter Medications:  Outpatient Encounter Medications as of 10/06/2020  Medication Sig   ACCU-CHEK SOFTCLIX LANCETS lancets    cholecalciferol (VITAMIN D3) 25 MCG (1000 UNIT) tablet Take 1,000 Units by mouth daily.   dabigatran (PRADAXA) 150 MG CAPS capsule Take 1 capsule (150 mg total) by mouth 2 (two) times daily.   diltiazem (CARDIZEM) 60 MG tablet Take 1 tablet (60 mg total) by mouth every 6 (six) hours.   fish oil-omega-3 fatty acids 1000 MG capsule Take 1 g by mouth daily.   glipiZIDE (GLUCOTROL) 10 MG tablet Take 10 mg by mouth 2 (two) times daily before a meal.   latanoprost (XALATAN) 0.005 % ophthalmic solution Place 1 drop into both eyes at bedtime.   linagliptin (TRADJENTA) 5 MG TABS tablet Take 1 tablet (5 mg total) by mouth daily.   lisinopril (PRINIVIL,ZESTRIL) 10 MG tablet TAKE 1 TABLET DAILY (Patient taking differently: Take 10 mg by mouth daily.)   metFORMIN (GLUCOPHAGE) 500 MG tablet Take 1,000 mg by mouth 2 (two) times daily with a meal.   metoprolol tartrate (LOPRESSOR) 50 MG tablet Take 1 tablet (50 mg total) by mouth 2 (two) times daily.   Semaglutide (RYBELSUS) 3 MG TABS Take 3 mg by mouth daily.   TRUETEST TEST test strip    atorvastatin (LIPITOR) 40 MG tablet Take 1 tablet (40 mg total) by mouth daily.   [DISCONTINUED] iron polysaccharides (NIFEREX) 150 MG capsule Take 150 mg by mouth  daily.     [DISCONTINUED] topiramate (TOPAMAX) 15 MG capsule Take 15 mg by mouth 2 (two) times daily.     No facility-administered encounter medications on file as of 10/06/2020.    Functional Status:  In your present state of health, do you have any difficulty performing the following activities: 10/06/2020 04/27/2020  Hearing? N N  Vision? N N  Difficulty concentrating or making decisions? N N  Walking or climbing stairs? N N  Dressing or bathing? N N  Doing errands, shopping? N N  Preparing Food and eating ? N N  Using the Toilet? N N  In the past six months, have you accidently leaked urine? N N  Do you have problems with loss of bowel control? N N  Managing your Medications? Y N  Comment [name] helps -  Managing your Finances? Y N  Comment [name] Helps -  Housekeeping or managing your Housekeeping? Y N  Comment [name] helps -  Some recent data might be hidden    Fall/Depression Screening: Fall Risk  10/06/2020 04/27/2020 02/17/2020  Falls in the past year? 0 0 0  Number falls in past yr: - - -  Injury with Fall? - - -   PHQ 2/9 Scores 10/06/2020 04/27/2020 02/17/2020 01/31/2020 08/30/2019 05/07/2019 05/03/2019  PHQ - 2 Score 0 0 0 0 0 0 0    Assessment:   Care Plan Care Plan : Diabetes Type  2 (Adult)  Updates made by Fleeta Emmer, RN since 10/06/2020 12:00 AM     Problem: Disease Progression (Diabetes, Type 2)      Long-Range Goal: Disease Progression Prevented or Minimized as evidenced by A1c of less than 8.5   Start Date: 02/17/2020  Expected End Date: 04/14/2021  This Visit's Progress: On track  Recent Progress: On track  Priority: High  Note:   Evidence-based guidance:  Prepare patient for laboratory and diagnostic exams based on risk and presentation.  Encourage lifestyle changes, such as increased intake of plant-based foods, stress reduction, consistent physical activity and smoking cessation to prevent long-term complications and chronic disease.   Individualize  activity and exercise recommendations while considering potential limitations, such as neuropathy, retinopathy or the ability to prevent hyperglycemia or hypoglycemia.   Prepare patient for use of pharmacologic therapy that may include antihypertensive, analgesic, prostaglandin E1 with periodic adjustments, based on presenting chronic condition and laboratory results.  Notes:     Task: Monitor and Manage Follow-up for Comorbidities   Due Date: 01/12/2021  Priority: Routine  Responsible User: Fleeta Emmer, RN  Note:   Care Management Activities:    - healthy lifestyle promoted - response to pharmacologic therapy monitored - vital signs and trends reviewed    Notes: Patient sugars in 100-120 range.   10/06/20 Diabetes Management Discussed: Medication adherence Reviewed importance of limiting carbs such as rice, potatoes, breads, and pastas. Also discussed limiting sweets and sugary drinks.  Discussed importance of portion control.  Also discussed importance of annual exams, foot exams, and eye exams.         Goals Addressed             This Visit's Progress    Monitor and Manage My Blood Sugar   On track    Timeframe: Long-Range Goal Priority:  High Start Date:    04/04/20                         Expected End Date:    12/13/20                Follow Up Date 08/12/20   - check blood sugar at prescribed times - enter blood sugar readings and medication or insulin into daily log - take the blood sugar meter to all doctor visits    Why is this important?   Checking your blood sugar at home helps to keep it from getting very high or very low.  Writing the results in a diary or log helps the doctor know how to care for you.  Your blood sugar log should have the time, date and the results.  Also, write down the amount of insulin or other medicine that you take.  Other information, like what you ate, exercise done and how you were feeling, will also be helpful.     Notes:   10/06/20 Spoke with patient and caregiver [name], patient sugars have been increased in the 200's. On trial of Rybelsus. To see physician on 10/18/20.  Discussed diabetes diet.   Diabetes Management Discussed: Medication adherence Reviewed importance of limiting carbs such as rice, potatoes, breads, and pastas. Also discussed limiting sweets and sugary drinks.  Discussed importance of portion control.  Also discussed importance of annual exams, foot exams, and eye exams.         COMPLETED: Track and Manage Heart Rate and Rhythm   On track    Timeframe:  Long-Range Goal Priority:  High Start Date:   04/04/20                          Expected End Date:12/13/20                    Follow Up Date 09/11/20   - check pulse (heart) rate once a day - take medicine as prescribed    Why is this important?   Atrial fibrillation may have no symptoms. Sometimes the symptoms get worse or happen more often.  It is important to keep track of what your symptoms are and when they happen.  A change in symptoms is important to discuss with your doctor or nurse.  Being active and healthy eating will also help you manage your heart condition.     Notes:          Plan:  Follow-up: Patient agrees to Care Plan and Follow-up. Follow-up in 4-6 week(s)  [name], RN, MSN [facility] Care Management Care Management Coordinator Direct Line [phone] Cell [phone] Toll Free: [phone]  Fax: [phone]

## 2020-10-06 NOTE — Patient Instructions (Signed)
Goals Addressed             This Visit's Progress    Monitor and Manage My Blood Sugar   On track    Timeframe: Long-Range Goal Priority:  High Start Date:    04/04/20                         Expected End Date:    12/13/20                Follow Up Date 08/12/20   - check blood sugar at prescribed times - enter blood sugar readings and medication or insulin into daily log - take the blood sugar meter to all doctor visits    Why is this important?   Checking your blood sugar at home helps to keep it from getting very high or very low.  Writing the results in a diary or log helps the doctor know how to care for you.  Your blood sugar log should have the time, date and the results.  Also, write down the amount of insulin or other medicine that you take.  Other information, like what you ate, exercise done and how you were feeling, will also be helpful.     Notes:  10/06/20 Spoke with patient and caregiver [name], patient sugars have been increased in the 200's. On trial of Rybelsus. To see physician on 10/18/20.  Discussed diabetes diet.   Diabetes Management Discussed: Medication adherence Reviewed importance of limiting carbs such as rice, potatoes, breads, and pastas. Also discussed limiting sweets and sugary drinks.  Discussed importance of portion control.  Also discussed importance of annual exams, foot exams, and eye exams.         COMPLETED: Track and Manage Heart Rate and Rhythm   On track    Timeframe:  Long-Range Goal Priority:  High Start Date:   04/04/20                          Expected End Date:12/13/20                    Follow Up Date 09/11/20   - check pulse (heart) rate once a day - take medicine as prescribed    Why is this important?   Atrial fibrillation may have no symptoms. Sometimes the symptoms get worse or happen more often.  It is important to keep track of what your symptoms are and when they happen.  A change in symptoms is important to discuss with  your doctor or nurse.  Being active and healthy eating will also help you manage your heart condition.     Notes:

## 2020-10-09 DIAGNOSIS — E1169 Type 2 diabetes mellitus with other specified complication: Secondary | ICD-10-CM | POA: Diagnosis not present

## 2020-10-09 DIAGNOSIS — Z794 Long term (current) use of insulin: Secondary | ICD-10-CM | POA: Diagnosis not present

## 2020-10-09 DIAGNOSIS — E1165 Type 2 diabetes mellitus with hyperglycemia: Secondary | ICD-10-CM | POA: Diagnosis not present

## 2020-10-09 DIAGNOSIS — I4891 Unspecified atrial fibrillation: Secondary | ICD-10-CM | POA: Diagnosis not present

## 2020-10-09 DIAGNOSIS — E782 Mixed hyperlipidemia: Secondary | ICD-10-CM | POA: Diagnosis not present

## 2020-10-09 DIAGNOSIS — I739 Peripheral vascular disease, unspecified: Secondary | ICD-10-CM | POA: Diagnosis not present

## 2020-10-09 DIAGNOSIS — Z8673 Personal history of transient ischemic attack (TIA), and cerebral infarction without residual deficits: Secondary | ICD-10-CM | POA: Diagnosis not present

## 2020-10-09 DIAGNOSIS — Z7984 Long term (current) use of oral hypoglycemic drugs: Secondary | ICD-10-CM | POA: Diagnosis not present

## 2020-10-09 DIAGNOSIS — I10 Essential (primary) hypertension: Secondary | ICD-10-CM | POA: Diagnosis not present

## 2020-10-12 DIAGNOSIS — I480 Paroxysmal atrial fibrillation: Secondary | ICD-10-CM | POA: Diagnosis not present

## 2020-10-12 DIAGNOSIS — E1165 Type 2 diabetes mellitus with hyperglycemia: Secondary | ICD-10-CM | POA: Diagnosis not present

## 2020-10-12 DIAGNOSIS — E1169 Type 2 diabetes mellitus with other specified complication: Secondary | ICD-10-CM | POA: Diagnosis not present

## 2020-10-12 DIAGNOSIS — E782 Mixed hyperlipidemia: Secondary | ICD-10-CM | POA: Diagnosis not present

## 2020-10-12 DIAGNOSIS — I4891 Unspecified atrial fibrillation: Secondary | ICD-10-CM | POA: Diagnosis not present

## 2020-10-12 DIAGNOSIS — Z8673 Personal history of transient ischemic attack (TIA), and cerebral infarction without residual deficits: Secondary | ICD-10-CM | POA: Diagnosis not present

## 2020-10-12 DIAGNOSIS — Z794 Long term (current) use of insulin: Secondary | ICD-10-CM | POA: Diagnosis not present

## 2020-10-12 DIAGNOSIS — E7849 Other hyperlipidemia: Secondary | ICD-10-CM | POA: Diagnosis not present

## 2020-10-12 DIAGNOSIS — I10 Essential (primary) hypertension: Secondary | ICD-10-CM | POA: Diagnosis not present

## 2020-10-12 DIAGNOSIS — Z7984 Long term (current) use of oral hypoglycemic drugs: Secondary | ICD-10-CM | POA: Diagnosis not present

## 2020-10-12 DIAGNOSIS — I739 Peripheral vascular disease, unspecified: Secondary | ICD-10-CM | POA: Diagnosis not present

## 2020-10-18 DIAGNOSIS — I10 Essential (primary) hypertension: Secondary | ICD-10-CM | POA: Diagnosis not present

## 2020-10-18 DIAGNOSIS — E1169 Type 2 diabetes mellitus with other specified complication: Secondary | ICD-10-CM | POA: Diagnosis not present

## 2020-10-18 DIAGNOSIS — E782 Mixed hyperlipidemia: Secondary | ICD-10-CM | POA: Diagnosis not present

## 2020-10-19 DIAGNOSIS — Z8673 Personal history of transient ischemic attack (TIA), and cerebral infarction without residual deficits: Secondary | ICD-10-CM | POA: Diagnosis not present

## 2020-10-19 DIAGNOSIS — I739 Peripheral vascular disease, unspecified: Secondary | ICD-10-CM | POA: Diagnosis not present

## 2020-10-19 DIAGNOSIS — E1169 Type 2 diabetes mellitus with other specified complication: Secondary | ICD-10-CM | POA: Diagnosis not present

## 2020-10-19 DIAGNOSIS — E1165 Type 2 diabetes mellitus with hyperglycemia: Secondary | ICD-10-CM | POA: Diagnosis not present

## 2020-10-19 DIAGNOSIS — Z7984 Long term (current) use of oral hypoglycemic drugs: Secondary | ICD-10-CM | POA: Diagnosis not present

## 2020-10-19 DIAGNOSIS — I10 Essential (primary) hypertension: Secondary | ICD-10-CM | POA: Diagnosis not present

## 2020-10-19 DIAGNOSIS — E782 Mixed hyperlipidemia: Secondary | ICD-10-CM | POA: Diagnosis not present

## 2020-10-19 DIAGNOSIS — Z794 Long term (current) use of insulin: Secondary | ICD-10-CM | POA: Diagnosis not present

## 2020-10-19 DIAGNOSIS — I4891 Unspecified atrial fibrillation: Secondary | ICD-10-CM | POA: Diagnosis not present

## 2020-10-20 DIAGNOSIS — Z794 Long term (current) use of insulin: Secondary | ICD-10-CM | POA: Diagnosis not present

## 2020-10-20 DIAGNOSIS — E1165 Type 2 diabetes mellitus with hyperglycemia: Secondary | ICD-10-CM | POA: Diagnosis not present

## 2020-10-20 DIAGNOSIS — I4891 Unspecified atrial fibrillation: Secondary | ICD-10-CM | POA: Diagnosis not present

## 2020-10-20 DIAGNOSIS — E782 Mixed hyperlipidemia: Secondary | ICD-10-CM | POA: Diagnosis not present

## 2020-10-20 DIAGNOSIS — I10 Essential (primary) hypertension: Secondary | ICD-10-CM | POA: Diagnosis not present

## 2020-10-20 DIAGNOSIS — Z8673 Personal history of transient ischemic attack (TIA), and cerebral infarction without residual deficits: Secondary | ICD-10-CM | POA: Diagnosis not present

## 2020-10-20 DIAGNOSIS — I739 Peripheral vascular disease, unspecified: Secondary | ICD-10-CM | POA: Diagnosis not present

## 2020-10-20 DIAGNOSIS — Z7984 Long term (current) use of oral hypoglycemic drugs: Secondary | ICD-10-CM | POA: Diagnosis not present

## 2020-10-20 DIAGNOSIS — E1169 Type 2 diabetes mellitus with other specified complication: Secondary | ICD-10-CM | POA: Diagnosis not present

## 2020-10-24 DIAGNOSIS — H40012 Open angle with borderline findings, low risk, left eye: Secondary | ICD-10-CM | POA: Diagnosis not present

## 2020-10-24 DIAGNOSIS — H401210 Low-tension glaucoma, right eye, stage unspecified: Secondary | ICD-10-CM | POA: Diagnosis not present

## 2020-10-26 DIAGNOSIS — Z8673 Personal history of transient ischemic attack (TIA), and cerebral infarction without residual deficits: Secondary | ICD-10-CM | POA: Diagnosis not present

## 2020-10-26 DIAGNOSIS — E1165 Type 2 diabetes mellitus with hyperglycemia: Secondary | ICD-10-CM | POA: Diagnosis not present

## 2020-10-26 DIAGNOSIS — E782 Mixed hyperlipidemia: Secondary | ICD-10-CM | POA: Diagnosis not present

## 2020-10-26 DIAGNOSIS — I739 Peripheral vascular disease, unspecified: Secondary | ICD-10-CM | POA: Diagnosis not present

## 2020-10-26 DIAGNOSIS — Z7984 Long term (current) use of oral hypoglycemic drugs: Secondary | ICD-10-CM | POA: Diagnosis not present

## 2020-10-26 DIAGNOSIS — I4891 Unspecified atrial fibrillation: Secondary | ICD-10-CM | POA: Diagnosis not present

## 2020-10-26 DIAGNOSIS — Z794 Long term (current) use of insulin: Secondary | ICD-10-CM | POA: Diagnosis not present

## 2020-10-26 DIAGNOSIS — E1169 Type 2 diabetes mellitus with other specified complication: Secondary | ICD-10-CM | POA: Diagnosis not present

## 2020-10-26 DIAGNOSIS — I10 Essential (primary) hypertension: Secondary | ICD-10-CM | POA: Diagnosis not present

## 2020-10-30 ENCOUNTER — Other Ambulatory Visit: Payer: Self-pay

## 2020-10-30 NOTE — Patient Outreach (Signed)
[facility]) Care Management  10/30/2020  [name] 1932-05-20 [id number]   Telephone call to patient for disease management follow up.   No answer.  HIPAA compliant voice message left.    Plan: If no return call, RN CM will attempt patient again in the month of August.   Clyda Smyth J Cherese Lozano, RN, MSN [facility] Care Management Care Management Coordinator Direct Line [phone] Cell [phone] Toll Free: [phone]  Fax: [phone]

## 2020-11-01 ENCOUNTER — Ambulatory Visit: Payer: Self-pay

## 2020-11-01 DIAGNOSIS — E1165 Type 2 diabetes mellitus with hyperglycemia: Secondary | ICD-10-CM | POA: Diagnosis not present

## 2020-11-01 DIAGNOSIS — Z794 Long term (current) use of insulin: Secondary | ICD-10-CM | POA: Diagnosis not present

## 2020-11-01 DIAGNOSIS — E1169 Type 2 diabetes mellitus with other specified complication: Secondary | ICD-10-CM | POA: Diagnosis not present

## 2020-11-01 DIAGNOSIS — I4891 Unspecified atrial fibrillation: Secondary | ICD-10-CM | POA: Diagnosis not present

## 2020-11-01 DIAGNOSIS — Z7984 Long term (current) use of oral hypoglycemic drugs: Secondary | ICD-10-CM | POA: Diagnosis not present

## 2020-11-01 DIAGNOSIS — E782 Mixed hyperlipidemia: Secondary | ICD-10-CM | POA: Diagnosis not present

## 2020-11-01 DIAGNOSIS — I739 Peripheral vascular disease, unspecified: Secondary | ICD-10-CM | POA: Diagnosis not present

## 2020-11-01 DIAGNOSIS — Z8673 Personal history of transient ischemic attack (TIA), and cerebral infarction without residual deficits: Secondary | ICD-10-CM | POA: Diagnosis not present

## 2020-11-01 DIAGNOSIS — I10 Essential (primary) hypertension: Secondary | ICD-10-CM | POA: Diagnosis not present

## 2020-11-02 DIAGNOSIS — I4891 Unspecified atrial fibrillation: Secondary | ICD-10-CM | POA: Diagnosis not present

## 2020-11-02 DIAGNOSIS — Z8673 Personal history of transient ischemic attack (TIA), and cerebral infarction without residual deficits: Secondary | ICD-10-CM | POA: Diagnosis not present

## 2020-11-02 DIAGNOSIS — Z7984 Long term (current) use of oral hypoglycemic drugs: Secondary | ICD-10-CM | POA: Diagnosis not present

## 2020-11-02 DIAGNOSIS — E782 Mixed hyperlipidemia: Secondary | ICD-10-CM | POA: Diagnosis not present

## 2020-11-02 DIAGNOSIS — E1169 Type 2 diabetes mellitus with other specified complication: Secondary | ICD-10-CM | POA: Diagnosis not present

## 2020-11-02 DIAGNOSIS — E1165 Type 2 diabetes mellitus with hyperglycemia: Secondary | ICD-10-CM | POA: Diagnosis not present

## 2020-11-02 DIAGNOSIS — I739 Peripheral vascular disease, unspecified: Secondary | ICD-10-CM | POA: Diagnosis not present

## 2020-11-02 DIAGNOSIS — Z794 Long term (current) use of insulin: Secondary | ICD-10-CM | POA: Diagnosis not present

## 2020-11-02 DIAGNOSIS — I10 Essential (primary) hypertension: Secondary | ICD-10-CM | POA: Diagnosis not present

## 2020-11-10 DIAGNOSIS — I4891 Unspecified atrial fibrillation: Secondary | ICD-10-CM | POA: Diagnosis not present

## 2020-11-10 DIAGNOSIS — E1169 Type 2 diabetes mellitus with other specified complication: Secondary | ICD-10-CM | POA: Diagnosis not present

## 2020-11-10 DIAGNOSIS — Z8673 Personal history of transient ischemic attack (TIA), and cerebral infarction without residual deficits: Secondary | ICD-10-CM | POA: Diagnosis not present

## 2020-11-10 DIAGNOSIS — I739 Peripheral vascular disease, unspecified: Secondary | ICD-10-CM | POA: Diagnosis not present

## 2020-11-10 DIAGNOSIS — Z7984 Long term (current) use of oral hypoglycemic drugs: Secondary | ICD-10-CM | POA: Diagnosis not present

## 2020-11-10 DIAGNOSIS — Z794 Long term (current) use of insulin: Secondary | ICD-10-CM | POA: Diagnosis not present

## 2020-11-10 DIAGNOSIS — E1165 Type 2 diabetes mellitus with hyperglycemia: Secondary | ICD-10-CM | POA: Diagnosis not present

## 2020-11-10 DIAGNOSIS — I10 Essential (primary) hypertension: Secondary | ICD-10-CM | POA: Diagnosis not present

## 2020-11-10 DIAGNOSIS — E782 Mixed hyperlipidemia: Secondary | ICD-10-CM | POA: Diagnosis not present

## 2020-11-21 ENCOUNTER — Ambulatory Visit: Payer: Medicare HMO | Admitting: Cardiology

## 2020-11-21 ENCOUNTER — Other Ambulatory Visit: Payer: Self-pay

## 2020-11-22 DIAGNOSIS — E782 Mixed hyperlipidemia: Secondary | ICD-10-CM | POA: Diagnosis not present

## 2020-11-22 DIAGNOSIS — Z7984 Long term (current) use of oral hypoglycemic drugs: Secondary | ICD-10-CM | POA: Diagnosis not present

## 2020-11-22 DIAGNOSIS — E1169 Type 2 diabetes mellitus with other specified complication: Secondary | ICD-10-CM | POA: Diagnosis not present

## 2020-11-22 DIAGNOSIS — Z794 Long term (current) use of insulin: Secondary | ICD-10-CM | POA: Diagnosis not present

## 2020-11-22 DIAGNOSIS — R634 Abnormal weight loss: Secondary | ICD-10-CM | POA: Diagnosis not present

## 2020-11-22 DIAGNOSIS — Z8673 Personal history of transient ischemic attack (TIA), and cerebral infarction without residual deficits: Secondary | ICD-10-CM | POA: Diagnosis not present

## 2020-11-22 DIAGNOSIS — I10 Essential (primary) hypertension: Secondary | ICD-10-CM | POA: Diagnosis not present

## 2020-11-22 DIAGNOSIS — E1165 Type 2 diabetes mellitus with hyperglycemia: Secondary | ICD-10-CM | POA: Diagnosis not present

## 2020-11-22 DIAGNOSIS — I739 Peripheral vascular disease, unspecified: Secondary | ICD-10-CM | POA: Diagnosis not present

## 2020-11-22 DIAGNOSIS — I4891 Unspecified atrial fibrillation: Secondary | ICD-10-CM | POA: Diagnosis not present

## 2020-11-22 DIAGNOSIS — F0280 Dementia in other diseases classified elsewhere without behavioral disturbance: Secondary | ICD-10-CM | POA: Diagnosis not present

## 2020-11-22 NOTE — Progress Notes (Signed)
HPI: FU paroxysmal atrial fibrillation, atrial flutter and prior CVA. She had a Myoview performed on Aug 27, 2005, that showed an ejection fraction of 80% and normal perfusion. Patient followed by vascular surgery for peripheral vascular disease.  ABIs December 2021 noncompressible on the right and left. Echocardiogram November 2021 showed ejection fraction 50 to 55%, mild RV dysfunction.  Had cardioversion November 2021.  Since I last saw her, she denies dyspnea, chest pain, palpitations or syncope.  No bleeding.  Current Outpatient Medications  Medication Sig Dispense Refill   ACCU-CHEK SOFTCLIX LANCETS lancets      cholecalciferol (VITAMIN D3) 25 MCG (1000 UNIT) tablet Take 1,000 Units by mouth daily.     dabigatran (PRADAXA) 150 MG CAPS capsule Take 1 capsule (150 mg total) by mouth 2 (two) times daily. 180 capsule 1   diltiazem (CARDIZEM) 60 MG tablet Take 1 tablet (60 mg total) by mouth every 6 (six) hours. 120 tablet 0   fish oil-omega-3 fatty acids 1000 MG capsule Take 1 g by mouth daily.     glipiZIDE (GLUCOTROL) 10 MG tablet Take 10 mg by mouth 2 (two) times daily before a meal.     JARDIANCE 25 MG TABS tablet Take 25 mg by mouth daily.     latanoprost (XALATAN) 0.005 % ophthalmic solution Place 1 drop into both eyes at bedtime.     linagliptin (TRADJENTA) 5 MG TABS tablet Take 1 tablet (5 mg total) by mouth daily. 90 tablet 3   lisinopril (PRINIVIL,ZESTRIL) 10 MG tablet TAKE 1 TABLET DAILY (Patient taking differently: Take 10 mg by mouth daily.) 30 tablet 0   metoprolol tartrate (LOPRESSOR) 50 MG tablet Take 1 tablet (50 mg total) by mouth 2 (two) times daily. 60 tablet 0   Semaglutide (RYBELSUS) 3 MG TABS Take 3 mg by mouth daily.     TRUETEST TEST test strip      atorvastatin (LIPITOR) 40 MG tablet Take 1 tablet (40 mg total) by mouth daily. 90 tablet 3   No current facility-administered medications for this visit.     Past Medical History:  Diagnosis Date   Atrial  fibrillation (HCC)    Diabetes mellitus    DVT (deep venous thrombosis) (HCC)    Hyperlipidemia    Hypertension    Leg pain    Peripheral arterial disease (HCC)    Stroke (HCC) 1999    Past Surgical History:  Procedure Laterality Date   BACK SURGERY  1996   CARDIOVERSION N/A 02/15/2020   Procedure: CARDIOVERSION;  Surgeon: Nahser, Deloris Ping, MD;  Location: [facility] ENDOSCOPY;  Service: Cardiovascular;  Laterality: N/A;   CATARACT EXTRACTION  2000   CESAREAN SECTION     EYE SURGERY     FEMORAL BYPASS Left July 23, 2010   LUMBAR DISC SURGERY     SPINE SURGERY      Social History   Socioeconomic History   Marital status: Widowed    Spouse name: Not on file   Number of children: 1   Years of education: Not on file   Highest education level: Not on file  Occupational History   Occupation: Retired    Comment: Teacher, adult education  Tobacco Use   Smoking status: Never   Smokeless tobacco: Never  Vaping Use   Vaping Use: Never used  Substance and Sexual Activity   Alcohol use: No   Drug use: No   Sexual activity: Not on file  Other Topics Concern  Not on file  Social History Narrative   Lives alone. Has daily contact with friends. Transportation by friends and sometimes [employer].   Social Determinants of Health   Financial Resource Strain: Low Risk    Difficulty of Paying Living Expenses: Not hard at all  Food Insecurity: No Food Insecurity   Worried About Programme researcher, broadcasting/film/video in the Last Year: Never true   Ran Out of Food in the Last Year: Never true  Transportation Needs: No Transportation Needs   Lack of Transportation (Medical): No   Lack of Transportation (Non-Medical): No  Physical Activity: Sufficiently Active   Days of Exercise per Week: 7 days   Minutes of Exercise per Session: 120 min  Stress: Not on file  Social Connections: Not on file  Intimate Partner Violence: Not on file    Family History  Problem Relation Age of Onset    Hypertension Mother    Heart attack Mother    Diabetes Father    Hypertension Father    Stroke Father    Stroke Sister    Diabetes Sister    Heart disease Sister        Before age 56-Aneurysm   Dementia Sister    Alzheimer's disease Sister    Diabetes Brother    Diabetes Brother    Heart disease Brother        Before age 9   Heart attack Brother    Heart attack Sister    Diabetes Sister    Alzheimer's disease Sister     ROS: no fevers or chills, productive cough, hemoptysis, dysphasia, odynophagia, melena, hematochezia, dysuria, hematuria, rash, seizure activity, orthopnea, PND, pedal edema, claudication. Remaining systems are negative.  Physical Exam: Well-developed well-nourished in no acute distress.  Skin is warm and dry.  HEENT is normal.  Neck is supple.  Chest is clear to auscultation with normal expansion.  Cardiovascular exam is regular rate and rhythm.  Abdominal exam nontender or distended. No masses palpated. Extremities show no edema. neuro grossly intact  A/P  1 paroxysmal atrial fibrillation/flutter-patient is in sinus rhythm on exam today.  We will continue present dose of Cardizem (changed to Cardizem CD 240 mg daily once she is out of 60 mg every 6 hours) and metoprolol for rate control of atrial arrhythmias recur.  She states her Pradaxa was not renewed.  I will treat with apixaban 2.5 mg twice daily to decrease risk of embolic event.  In 1 week check hemoglobin.  2 hypertension-blood pressure elevated; increase lisinopril to 20 mg daily.  Check potassium and renal function in 1 week.  3 hyperlipidemia-continue statin.  Check lipids and liver.  4 peripheral vascular disease-continue medical therapy.  Continue statin.  She is not on aspirin given need for apixaban.  She is followed by vascular surgery.  Olga Millers, MD

## 2020-11-23 ENCOUNTER — Other Ambulatory Visit: Payer: Self-pay

## 2020-11-23 ENCOUNTER — Ambulatory Visit: Payer: Medicare HMO | Admitting: Cardiology

## 2020-11-23 ENCOUNTER — Encounter: Payer: Self-pay | Admitting: Cardiology

## 2020-11-23 VITALS — BP 158/72 | HR 62 | Ht 62.0 in | Wt 84.8 lb

## 2020-11-23 DIAGNOSIS — I779 Disorder of arteries and arterioles, unspecified: Secondary | ICD-10-CM

## 2020-11-23 DIAGNOSIS — I480 Paroxysmal atrial fibrillation: Secondary | ICD-10-CM | POA: Diagnosis not present

## 2020-11-23 DIAGNOSIS — E785 Hyperlipidemia, unspecified: Secondary | ICD-10-CM | POA: Diagnosis not present

## 2020-11-23 DIAGNOSIS — I10 Essential (primary) hypertension: Secondary | ICD-10-CM | POA: Diagnosis not present

## 2020-11-23 MED ORDER — APIXABAN 2.5 MG PO TABS: 2.5000 mg | ORAL_TABLET | Freq: Two times a day (BID) | ORAL | 3 refills | Status: DC

## 2020-11-23 MED ORDER — APIXABAN 2.5 MG PO TABS: 2.5000 mg | ORAL_TABLET | Freq: Two times a day (BID) | ORAL | 12 refills | Status: DC

## 2020-11-23 MED ORDER — DILTIAZEM HCL ER COATED BEADS 240 MG PO CP24: 240.0000 mg | ORAL_CAPSULE | Freq: Every day | ORAL | 3 refills | Status: DC

## 2020-11-23 MED ORDER — DILTIAZEM HCL ER COATED BEADS 240 MG PO CP24
240.0000 mg | ORAL_CAPSULE | Freq: Every day | ORAL | 3 refills | Status: DC
Start: 2020-11-23 — End: 2021-08-07

## 2020-11-23 MED ORDER — LISINOPRIL 20 MG PO TABS: 20.0000 mg | ORAL_TABLET | Freq: Every day | ORAL | 3 refills | Status: DC

## 2020-11-23 MED ORDER — METOPROLOL TARTRATE 50 MG PO TABS: 50.0000 mg | ORAL_TABLET | Freq: Two times a day (BID) | ORAL | 3 refills | Status: DC

## 2020-11-23 NOTE — Patient Instructions (Signed)
Medication Instructions:   START ELIQUIS 2.5 MG TWICE DAILY  INCREASE LISINOPRIL TO 20 MG ONCE DAILY= 2 OF THE 10 MG TABLETS ONCE DAILY  WHEN CURRENT DILTIAZEM SUPPLY COMPLETE STOP AND START DILTIAZEM CD 240 MG ONCE DAILY  *If you need a refill on your cardiac medications before your next appointment, please call your pharmacy*   Lab Work:  Your physician recommends that you return for lab work in:ONE WEEK  If you have labs (blood work) drawn today and your tests are completely normal, you will receive your results only by: MyChart Message (if you have MyChart) OR A paper copy in the mail If you have any lab test that is abnormal or we need to change your treatment, we will call you to review the results.   Follow-Up: At [facility], you and your health needs are our priority.  As part of our continuing mission to provide you with exceptional heart care, we have created designated Provider Care Teams.  These Care Teams include your primary Cardiologist (physician) and Advanced Practice Providers (APPs -  Physician Assistants and Nurse Practitioners) who all work together to provide you with the care you need, when you need it.  We recommend signing up for the patient portal called "MyChart".  Sign up information is provided on this After Visit Summary.  MyChart is used to connect with patients for Virtual Visits (Telemedicine).  Patients are able to view lab/test results, encounter notes, upcoming appointments, etc.  Non-urgent messages can be sent to your provider as well.   To learn more about what you can do with MyChart, go to [email or web address].    Your next appointment:   6 month(s)  The format for your next appointment:   In Person  Provider:   Olga Millers, MD

## 2020-12-01 ENCOUNTER — Other Ambulatory Visit: Payer: Self-pay

## 2020-12-01 NOTE — Patient Instructions (Signed)
Goals Addressed               This Visit's Progress     Monitor and Manage My Blood Sugar (pt-stated)   On track     Barriers: Knowledge Timeframe: Long-Range Goal Priority:  High Start Date:    04/04/20                         Expected End Date:    04/14/21                Follow Up Date 02/11/21   - check blood sugar at prescribed times - enter blood sugar readings and medication or insulin into daily log - take the blood sugar meter to all doctor visits    Why is this important?   Checking your blood sugar at home helps to keep it from getting very high or very low.  Writing the results in a diary or log helps the doctor know how to care for you.  Your blood sugar log should have the time, date and the results.  Also, write down the amount of insulin or other medicine that you take.  Other information, like what you ate, exercise done and how you were feeling, will also be helpful.     Notes:  10/06/20 Spoke with patient and caregiver [name], patient sugars have been increased in the 200's. On trial of Rybelsus. To see physician on 10/18/20.  Discussed diabetes diet.   Diabetes Management Discussed: Medication adherence Reviewed importance of limiting carbs such as rice, potatoes, breads, and pastas. Also discussed limiting sweets and sugary drinks.  Discussed importance of portion control.  Also discussed importance of annual exams, foot exams, and eye exams.   12/01/20 Patient most recent A1c 11.9.  Blood sugar today was 159.  Limit carbohydrates and sweets.  Discussed diabetes management.

## 2020-12-01 NOTE — Patient Outreach (Signed)
[facility]) Care Management  [facility] Care Manager  12/01/2020   [name] 07/15/1932 [id number]  Subjective: Telephone call to patient for disease management follow up.  Patient doing ok.  Patient last A1c 11.9.  Reviewed diabetes management and importance.    Objective:   Encounter Medications:  Outpatient Encounter Medications as of 12/01/2020  Medication Sig   ACCU-CHEK SOFTCLIX LANCETS lancets    apixaban (ELIQUIS) 2.5 MG TABS tablet Take 1 tablet (2.5 mg total) by mouth 2 (two) times daily.   atorvastatin (LIPITOR) 40 MG tablet Take 1 tablet (40 mg total) by mouth daily.   cholecalciferol (VITAMIN D3) 25 MCG (1000 UNIT) tablet Take 1,000 Units by mouth daily.   diltiazem (CARDIZEM CD) 240 MG 24 hr capsule Take 1 capsule (240 mg total) by mouth daily.   fish oil-omega-3 fatty acids 1000 MG capsule Take 1 g by mouth daily.   glipiZIDE (GLUCOTROL) 10 MG tablet Take 10 mg by mouth 2 (two) times daily before a meal.   JARDIANCE 25 MG TABS tablet Take 25 mg by mouth daily.   latanoprost (XALATAN) 0.005 % ophthalmic solution Place 1 drop into both eyes at bedtime.   linagliptin (TRADJENTA) 5 MG TABS tablet Take 1 tablet (5 mg total) by mouth daily.   lisinopril (ZESTRIL) 20 MG tablet Take 1 tablet (20 mg total) by mouth daily.   metoprolol tartrate (LOPRESSOR) 50 MG tablet Take 1 tablet (50 mg total) by mouth 2 (two) times daily.   Semaglutide (RYBELSUS) 3 MG TABS Take 3 mg by mouth daily.   TRUETEST TEST test strip    [DISCONTINUED] iron polysaccharides (NIFEREX) 150 MG capsule Take 150 mg by mouth daily.     [DISCONTINUED] topiramate (TOPAMAX) 15 MG capsule Take 15 mg by mouth 2 (two) times daily.     No facility-administered encounter medications on file as of 12/01/2020.    Functional Status:  In your present state of health, do you have any difficulty performing the following activities: 10/06/2020 04/27/2020  Hearing? N N  Vision? N N  Difficulty  concentrating or making decisions? N N  Walking or climbing stairs? N N  Dressing or bathing? N N  Doing errands, shopping? N N  Preparing Food and eating ? N N  Using the Toilet? N N  In the past six months, have you accidently leaked urine? N N  Do you have problems with loss of bowel control? N N  Managing your Medications? Y N  Comment [name] helps -  Managing your Finances? Y N  Comment [name] Helps -  Housekeeping or managing your Housekeeping? Y N  Comment [name] helps -  Some recent data might be hidden    Fall/Depression Screening: Fall Risk  12/01/2020 10/06/2020 04/27/2020  Falls in the past year? 0 0 0  Number falls in past yr: - - -  Injury with Fall? - - -   PHQ 2/9 Scores 10/06/2020 04/27/2020 02/17/2020 01/31/2020 08/30/2019 05/07/2019 05/03/2019  PHQ - 2 Score 0 0 0 0 0 0 0    Assessment:   Care Plan Care Plan : Diabetes Type 2 (Adult)  Updates made by Fleeta Emmer, RN since 12/01/2020 12:00 AM     Problem: Disease Progression (Diabetes, Type 2)      Long-Range Goal: Disease Progression Prevented or Minimized as evidenced by A1c of less than 8.5   Start Date: 02/17/2020  Expected End Date: 04/14/2021  This Visit's Progress: Not on track  Recent Progress:  On track  Priority: High  Note:   Evidence-based guidance:  Prepare patient for laboratory and diagnostic exams based on risk and presentation.  Encourage lifestyle changes, such as increased intake of plant-based foods, stress reduction, consistent physical activity and smoking cessation to prevent long-term complications and chronic disease.   Individualize activity and exercise recommendations while considering potential limitations, such as neuropathy, retinopathy or the ability to prevent hyperglycemia or hypoglycemia.   Prepare patient for use of pharmacologic therapy that may include antihypertensive, analgesic, prostaglandin E1 with periodic adjustments, based on presenting chronic condition and laboratory  results.  Notes:     Task: Monitor and Manage Follow-up for Comorbidities   Due Date: 01/12/2021  Priority: Routine  Responsible User: Fleeta Emmer, RN  Note:   Care Management Activities:    - healthy lifestyle promoted - response to pharmacologic therapy monitored - vital signs and trends reviewed    Notes: Patient sugars in 100-120 range.   10/06/20 Diabetes Management Discussed: Medication adherence Reviewed importance of limiting carbs such as rice, potatoes, breads, and pastas. Also discussed limiting sweets and sugary drinks.  Discussed importance of portion control.  Also discussed importance of annual exams, foot exams, and eye exams.   12/01/20 Patient most recent A1c 11.9.  Caregiver reports patient sometimes eats what she is not supposed to eat.  Discussed diabetes management.      Goals Addressed               This Visit's Progress     Monitor and Manage My Blood Sugar (pt-stated)   On track     Barriers: Knowledge Timeframe: Long-Range Goal Priority:  High Start Date:    04/04/20                         Expected End Date:    04/14/21                Follow Up Date 02/11/21   - check blood sugar at prescribed times - enter blood sugar readings and medication or insulin into daily log - take the blood sugar meter to all doctor visits    Why is this important?   Checking your blood sugar at home helps to keep it from getting very high or very low.  Writing the results in a diary or log helps the doctor know how to care for you.  Your blood sugar log should have the time, date and the results.  Also, write down the amount of insulin or other medicine that you take.  Other information, like what you ate, exercise done and how you were feeling, will also be helpful.     Notes:  10/06/20 Spoke with patient and caregiver [name], patient sugars have been increased in the 200's. On trial of Rybelsus. To see physician on 10/18/20.  Discussed diabetes diet.   Diabetes  Management Discussed: Medication adherence Reviewed importance of limiting carbs such as rice, potatoes, breads, and pastas. Also discussed limiting sweets and sugary drinks.  Discussed importance of portion control.  Also discussed importance of annual exams, foot exams, and eye exams.   12/01/20 Patient most recent A1c 11.9.  Blood sugar today was 159.  Limit carbohydrates and sweets.  Discussed diabetes management.         Plan:  Follow-up: Patient agrees to Care Plan and Follow-up. Follow-up in 2 month(s)  [name], RN, MSN [facility] Care Management Care Management Coordinator Direct Line [phone] Cell  [phone] Toll Free: [phone]  Fax: [phone]

## 2020-12-04 DIAGNOSIS — I480 Paroxysmal atrial fibrillation: Secondary | ICD-10-CM | POA: Diagnosis not present

## 2020-12-04 DIAGNOSIS — E785 Hyperlipidemia, unspecified: Secondary | ICD-10-CM | POA: Diagnosis not present

## 2020-12-04 DIAGNOSIS — I10 Essential (primary) hypertension: Secondary | ICD-10-CM | POA: Diagnosis not present

## 2020-12-04 LAB — COMPREHENSIVE METABOLIC PANEL
ALT: 50 IU/L — ABNORMAL HIGH (ref 0–32)
AST: 35 IU/L (ref 0–40)
Albumin/Globulin Ratio: 1.7 (ref 1.2–2.2)
Albumin: 4.7 g/dL — ABNORMAL HIGH (ref 3.6–4.6)
Alkaline Phosphatase: 111 IU/L (ref 44–121)
BUN/Creatinine Ratio: 23 (ref 12–28)
BUN: 21 mg/dL (ref 8–27)
Bilirubin Total: 0.3 mg/dL (ref 0.0–1.2)
CO2: 24 mmol/L (ref 20–29)
Calcium: 10.2 mg/dL (ref 8.7–10.3)
Chloride: 100 mmol/L (ref 96–106)
Creatinine, Ser: 0.91 mg/dL (ref 0.57–1.00)
Globulin, Total: 2.7 g/dL (ref 1.5–4.5)
Glucose: 208 mg/dL — ABNORMAL HIGH (ref 65–99)
Potassium: 4.8 mmol/L (ref 3.5–5.2)
Sodium: 142 mmol/L (ref 134–144)
Total Protein: 7.4 g/dL (ref 6.0–8.5)
eGFR: 62 mL/min/{1.73_m2} (ref >59)

## 2020-12-04 LAB — CBC
Hematocrit: 42.4 % (ref 34.0–46.6)
Hemoglobin: 13.9 g/dL (ref 11.1–15.9)
MCH: 33.2 pg — ABNORMAL HIGH (ref 26.6–33.0)
MCHC: 32.8 g/dL (ref 31.5–35.7)
MCV: 101 fL — ABNORMAL HIGH (ref 79–97)
Platelets: 224 10*3/uL (ref 150–450)
RBC: 4.19 x10E6/uL (ref 3.77–5.28)
RDW: 11.9 % (ref 11.7–15.4)
WBC: 6.8 10*3/uL (ref 3.4–10.8)

## 2020-12-04 LAB — LIPID PANEL
Chol/HDL Ratio: 2.7 ratio (ref 0.0–4.4)
Cholesterol, Total: 177 mg/dL (ref 100–199)
HDL: 65 mg/dL (ref >39)
LDL Chol Calc (NIH): 98 mg/dL (ref 0–99)
Triglycerides: 75 mg/dL (ref 0–149)
VLDL Cholesterol Cal: 14 mg/dL (ref 5–40)

## 2020-12-05 ENCOUNTER — Telehealth: Payer: Self-pay | Admitting: Cardiology

## 2020-12-05 ENCOUNTER — Telehealth: Payer: Self-pay | Admitting: *Deleted

## 2020-12-05 ENCOUNTER — Ambulatory Visit: Payer: Self-pay

## 2020-12-05 DIAGNOSIS — E1169 Type 2 diabetes mellitus with other specified complication: Secondary | ICD-10-CM | POA: Diagnosis not present

## 2020-12-05 DIAGNOSIS — E785 Hyperlipidemia, unspecified: Secondary | ICD-10-CM

## 2020-12-05 DIAGNOSIS — I480 Paroxysmal atrial fibrillation: Secondary | ICD-10-CM

## 2020-12-05 DIAGNOSIS — E782 Mixed hyperlipidemia: Secondary | ICD-10-CM | POA: Diagnosis not present

## 2020-12-05 DIAGNOSIS — I4891 Unspecified atrial fibrillation: Secondary | ICD-10-CM | POA: Diagnosis not present

## 2020-12-05 DIAGNOSIS — Z7984 Long term (current) use of oral hypoglycemic drugs: Secondary | ICD-10-CM | POA: Diagnosis not present

## 2020-12-05 DIAGNOSIS — I739 Peripheral vascular disease, unspecified: Secondary | ICD-10-CM | POA: Diagnosis not present

## 2020-12-05 DIAGNOSIS — E1165 Type 2 diabetes mellitus with hyperglycemia: Secondary | ICD-10-CM | POA: Diagnosis not present

## 2020-12-05 DIAGNOSIS — Z8673 Personal history of transient ischemic attack (TIA), and cerebral infarction without residual deficits: Secondary | ICD-10-CM | POA: Diagnosis not present

## 2020-12-05 DIAGNOSIS — Z794 Long term (current) use of insulin: Secondary | ICD-10-CM | POA: Diagnosis not present

## 2020-12-05 DIAGNOSIS — I10 Essential (primary) hypertension: Secondary | ICD-10-CM | POA: Diagnosis not present

## 2020-12-05 MED ORDER — APIXABAN 2.5 MG PO TABS: 2.5000 mg | ORAL_TABLET | Freq: Two times a day (BID) | ORAL | 1 refills | Status: DC

## 2020-12-05 MED ORDER — EZETIMIBE 10 MG PO TABS: 10.0000 mg | ORAL_TABLET | Freq: Every day | ORAL | 3 refills | Status: DC

## 2020-12-05 NOTE — Telephone Encounter (Signed)
*  STAT* If patient is at the pharmacy, call can be transferred to refill team.   1. Which medications need to be refilled? (please list name of each medication and dose if known)  apixaban (ELIQUIS) 2.5 MG TABS tablet  2. Which pharmacy/location (including street and city if local pharmacy) is medication to be sent to?  [id number] - [place], [place] - [address]  3. Do they need a 30 day or 90 day supply? 30

## 2020-12-05 NOTE — Telephone Encounter (Signed)
Spoke with pt, Aware of dr crenshaw's recommendations.  ?New script sent to the pharmacy  ?Lab orders mailed to the pt  ?

## 2020-12-05 NOTE — Telephone Encounter (Addendum)
-----   Message from Lewayne Bunting, MD sent at 12/05/2020  7:44 AM EDT ----- Add zetia 10 mg daily; lipids and liver 12 weeks [name]   Left message for pt to call

## 2020-12-05 NOTE — Telephone Encounter (Signed)
Prescription refill request for Eliquis received. Indication:afib Last office visit:crenshaw 11/23/20 Scr:0.91 12/04/20 Age: 78f Weight:38.5kg

## 2020-12-07 ENCOUNTER — Other Ambulatory Visit: Payer: Self-pay

## 2020-12-07 ENCOUNTER — Encounter: Payer: Self-pay | Admitting: Psychology

## 2020-12-07 ENCOUNTER — Encounter: Payer: Medicare HMO | Attending: Psychology | Admitting: Psychology

## 2020-12-07 DIAGNOSIS — I4891 Unspecified atrial fibrillation: Secondary | ICD-10-CM | POA: Diagnosis not present

## 2020-12-07 DIAGNOSIS — Z8673 Personal history of transient ischemic attack (TIA), and cerebral infarction without residual deficits: Secondary | ICD-10-CM | POA: Diagnosis not present

## 2020-12-07 DIAGNOSIS — I739 Peripheral vascular disease, unspecified: Secondary | ICD-10-CM | POA: Diagnosis not present

## 2020-12-07 DIAGNOSIS — Z7984 Long term (current) use of oral hypoglycemic drugs: Secondary | ICD-10-CM | POA: Diagnosis not present

## 2020-12-07 DIAGNOSIS — R413 Other amnesia: Secondary | ICD-10-CM | POA: Diagnosis not present

## 2020-12-07 DIAGNOSIS — E1165 Type 2 diabetes mellitus with hyperglycemia: Secondary | ICD-10-CM | POA: Diagnosis not present

## 2020-12-07 DIAGNOSIS — E1169 Type 2 diabetes mellitus with other specified complication: Secondary | ICD-10-CM | POA: Diagnosis not present

## 2020-12-07 DIAGNOSIS — E782 Mixed hyperlipidemia: Secondary | ICD-10-CM | POA: Diagnosis not present

## 2020-12-07 DIAGNOSIS — Z794 Long term (current) use of insulin: Secondary | ICD-10-CM | POA: Diagnosis not present

## 2020-12-07 DIAGNOSIS — I10 Essential (primary) hypertension: Secondary | ICD-10-CM | POA: Diagnosis not present

## 2020-12-07 NOTE — Progress Notes (Signed)
Neuropsychological Consultation   Patient:   [name]   DOB:   [birth year]  MR Number:  [id number]  Location:  [facility] FOR PAIN AND [facility] MEDICINE [facility] PHYSICAL MEDICINE AND REHABILITATION [address] [place], [address] [id number] [facility] [place] [address] Dept: [phone]           Date of Service:   12/07/2020  Start Time:   8 AM End Time:   10 AM  Today's visit was an in person visit that was conducted in my outpatient clinic office.  The patient and her daughter were present for this visit.  1 hour 15 minutes was spent in formal face-to-face clinical interview and the other 45 minutes was spent with records review, report writing and setting up testing protocols.  Provider/Observer:  Arley Phenix, Psy.D.       Clinical Neuropsychologist       Billing Code/Service: 96116/96121  Chief Complaint:    [name] is an 87 year old female who is referred for neuropsychological evaluation by the patient's PCP [name], MD due to reports of progressive memory loss and changes.  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  The patient is also reported to have had 2 TIAs/"mini stroke" by her daughter.  The daughter reports that the first 1 occurred sometime around 9 and the second in the early 90s with hospitalization from the 86 events.  Medical records show that she had a stroke in 1999 but no pertinent data was available.  The patient is also had DVTs in the past.  The patient's daughter reports that these TIAs occurred during times of high blood pressure and the patient's blood pressure has been managed much more effectively over the next years and there have been no  more strokelike events noted.  Reason for Service:  [name] is an 87 year old female who is referred for neuropsychological evaluation by the patient's PCP [name], MD due to reports of progressive memory loss and changes.  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  The patient is also reported to have had 2 TIAs/"mini stroke" by her daughter.  The daughter reports that the first 1 occurred sometime around 48 and the second in the early 90s with hospitalization from the 86 events.  Medical records show that she had a stroke in 1999 but no pertinent data was available.  The patient is also had DVTs in the past.  The patient's daughter reports that these TIAs occurred during times of high blood pressure and the patient's blood pressure has been managed much more effectively over the next years and there have been no more strokelike events noted.  The patient's daughter describes current symptoms.  Patient's daughter reports that the patient will forget new information and events that have happened recently.  The daughter reports that there are good days and bad days and the patient will stress her self when trying to remember things acutely but if you give her a few minutes that she will often remember the information.  If her recall is demented on a  fast pace the patient has great difficulty remembering.  These changes were noted over the past year but it been progressively worsening.  1 stressor for the patient noted is that her younger sister (4 years younger) has been diagnosed with dementia and Alzheimer's and the patient worries about her.  Other symptoms noted include some issues of word finding difficulties.  The  patient is described to have good geographic orientation and will know where she is and also be able to identify changes in locations over the years.  There are no visual hallucinations or tremors reported.  The patient and her daughter both deny any history of concussive events and no loss of consciousness in the past and no seizures.  While the patient worked in the [employer] there is a denial of working around any type of solvents or other particulate toxic chemicals.  The patient's blood pressure is reported to be up and down at times but is generally well managed.  The patient has type 2 diabetes.  She initially developed gestational diabetes with her pregnancy with her son but then this resolved.  Return of diabetes (type 2 diabetes) developed after 67.  The patient denies any issues with mood disturbance and denies any significant anxiety or depression.  She does have some concerns around her sister who is developing progressive memory loss and been diagnosed with Alzheimer's and the patient also has a regular fear of falling without falls while walking.  The patient has had syncopal event when getting up out of bed on a couple of occasions and falling at that time but no falling when simply walking around.  There is a significant family history of stroke and heart attack.  The patient is reported to go to bed quite early at night she will often go to bed by 9 PM.  The patient reports that sometimes she has difficulty falling to sleep but will go to sleep at a reasonable time.  The patient reports that sometimes she wakes up in the middle of the night but is able to go back to sleep after about brief period of time.  The patient wakes up at 7 AM and reports that she feels rested in the morning.  The patient describes her appetite is very good and that she drinks water on a regular basis during the day.  The patient has regular interaction in contact with her daughter who keeps an eye on  her.  Behavioral Observation: [name]  presents as a 87 y.o.-year-old Right handed African American Female who appeared her stated age. her dress was Appropriate and she was Well Groomed and her manners were Appropriate to the situation.  her participation was indicative of Appropriate and Redirectable behaviors.  There were not physical disabilities noted.  she displayed an appropriate level of cooperation and motivation.     Interactions:    Active Appropriate  Attention:   abnormal and attention span appeared shorter than expected for age  Memory:   abnormal; remote memory intact, recent memory impaired  Visuo-spatial:  not examined  Speech (Volume):  normal  Speech:   normal; normal  Thought Process:  Coherent and Relevant  Though Content:  WNL; not suicidal and not homicidal  Orientation:   person, place, time/date, and situation  Judgment:   Fair  Planning:   Fair  Affect:    Appropriate  Mood:    Euthymic  Insight:   Fair  Intelligence:   normal  Marital Status/Living: The patient  was born in [facility] [place] along with 8 siblings and she has 1 surviving sibling now who is has been diagnosed with Alzheimer's.  The patient currently lives at home by her self but has regular visits and care by her daughter.  The patient is widowed and was married for 50 years.  She has 1 adult son and 1 adult daughter who is her adoptive daughter.  Current Employment: The patient is retired.  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  Substance Use:  No concerns of substance abuse are reported.    Education:   The patient graduated from high school attending and graduating from [employer].  Medical History:   Past Medical History:  Diagnosis Date   Atrial fibrillation (HCC)    Diabetes mellitus    DVT (deep venous thrombosis) (HCC)    Hyperlipidemia    Hypertension    Leg pain    Peripheral  arterial disease (HCC)    Stroke [facility]) 1999         Patient Active Problem List   Diagnosis Date Noted   Need for prophylactic antibiotic 02/14/2020   Atrial flutter with rapid ventricular response (HCC) 02/13/2020   Hypomagnesemia 02/13/2020   Atrial flutter (HCC) 02/12/2020   Cramp of both lower extremities 12/15/2014   Foot cramps 12/15/2014   Stiffness of foot 06/10/2014   Aftercare following surgery of the circulatory system, NEC 12/09/2013   Peripheral vascular disease (HCC) 10/03/2011   Bypass graft stenosis (HCC) 07/04/2011   Chronic total occlusion of artery of the extremities (HCC) 07/04/2011   PURE HYPERCHOLESTEROLEMIA 03/29/2009   ESSENTIAL HYPERTENSION, MALIGNANT 03/29/2009   Essential hypertension 03/28/2009   Type 2 diabetes mellitus with hyperlipidemia (HCC) 08/30/2008   HYPERLIPIDEMIA-MIXED 08/30/2008   ATRIAL FIBRILLATION 08/30/2008   SYNCOPE AND COLLAPSE 08/30/2008              Abuse/Trauma History: No reports of significant trauma or abuse history.  Psychiatric History:  No prior psychiatric history  Family Med/Psych History:  Family History  Problem Relation Age of Onset   Hypertension Mother    Heart attack Mother    Diabetes Father    Hypertension Father    Stroke Father    Stroke Sister    Diabetes Sister    Heart disease Sister        Before age 51-Aneurysm   Dementia Sister    Alzheimer's disease Sister    Diabetes Brother    Diabetes Brother    Heart disease Brother        Before age 105   Heart attack Brother    Heart attack Sister    Diabetes Sister    Alzheimer's disease Sister     Impression/DX:  [name] is an 87 year old female who is referred for neuropsychological evaluation by the patient's PCP [name], MD due to reports of progressive memory loss and changes.  The patient has a past medical history including type 2 diabetes with hyperlipidemia, high cholesterol, essential hypertension, atrial fibrillation,  episodes of syncope and collapse, previous bypass graft stenosis with occlusion of extremity artery, peripheral vascular disease, and now reports of progressive memory changes.  The patient is also reported to have had 2 TIAs/"mini stroke" by her daughter.  The daughter reports that the first 1 occurred sometime around 32 and the second in the early 90s with hospitalization from the 86 events.  Medical records show that she  had a stroke in 1999 but no pertinent data was available.  The patient is also had DVTs in the past.  The patient's daughter reports that these TIAs occurred during times of high blood pressure and the patient's blood pressure has been managed much more effectively over the next years and there have been no more strokelike events noted.  Disposition/Plan:  We will set the patient up for formal neuropsychological testing and complete the neuropsychological evaluation.  The patient will be administered the RBANS neuropsychological test battery as well as looking at expressive language functions.  Once this base battery is completed a determination will be made as to any potential need for further measures to be added.  Once this is completed a formal neuropsychological report be completed and provided to her referring physician and a feedback session will be set up for the patient and her family to go over the results with recommendations.  Diagnosis:    Memory loss         Electronically Signed   _______________________ Arley Phenix, Psy.D. Clinical Neuropsychologist

## 2020-12-13 DIAGNOSIS — E782 Mixed hyperlipidemia: Secondary | ICD-10-CM | POA: Diagnosis not present

## 2020-12-13 DIAGNOSIS — E1151 Type 2 diabetes mellitus with diabetic peripheral angiopathy without gangrene: Secondary | ICD-10-CM | POA: Diagnosis not present

## 2020-12-13 DIAGNOSIS — E1140 Type 2 diabetes mellitus with diabetic neuropathy, unspecified: Secondary | ICD-10-CM | POA: Diagnosis not present

## 2020-12-13 DIAGNOSIS — E1169 Type 2 diabetes mellitus with other specified complication: Secondary | ICD-10-CM | POA: Diagnosis not present

## 2020-12-13 DIAGNOSIS — Z8673 Personal history of transient ischemic attack (TIA), and cerebral infarction without residual deficits: Secondary | ICD-10-CM | POA: Diagnosis not present

## 2020-12-13 DIAGNOSIS — I4892 Unspecified atrial flutter: Secondary | ICD-10-CM | POA: Diagnosis not present

## 2020-12-13 DIAGNOSIS — I10 Essential (primary) hypertension: Secondary | ICD-10-CM | POA: Diagnosis not present

## 2020-12-13 DIAGNOSIS — Z7984 Long term (current) use of oral hypoglycemic drugs: Secondary | ICD-10-CM | POA: Diagnosis not present

## 2020-12-13 DIAGNOSIS — I4891 Unspecified atrial fibrillation: Secondary | ICD-10-CM | POA: Diagnosis not present

## 2020-12-14 ENCOUNTER — Encounter: Payer: Medicare HMO | Attending: Psychology

## 2020-12-14 ENCOUNTER — Other Ambulatory Visit: Payer: Self-pay

## 2020-12-14 DIAGNOSIS — F0280 Dementia in other diseases classified elsewhere without behavioral disturbance: Secondary | ICD-10-CM | POA: Diagnosis not present

## 2020-12-14 DIAGNOSIS — R413 Other amnesia: Secondary | ICD-10-CM | POA: Insufficient documentation

## 2020-12-14 DIAGNOSIS — F0150 Vascular dementia without behavioral disturbance: Secondary | ICD-10-CM | POA: Diagnosis not present

## 2020-12-14 NOTE — Progress Notes (Signed)
   Behavioral Observation  The patient appeared well-groomed and appropriately dressed. Her manners were polite and appropriate to the situation.    Neuropsychology Note  LENNETTE FADER completed 60 minutes of neuropsychological testing with technician, Marica Otter, BA, under the supervision of Arley Phenix, PsyD., Clinical Neuropsychologist. The patient did not appear overtly distressed by the testing session, per behavioral observation or via self-report to the technician. Rest breaks were offered.   Clinical Decision Making: In considering the patient's current level of functioning, level of presumed impairment, nature of symptoms, emotional and behavioral responses during clinical interview, level of literacy, and observed level of motivation/effort, a battery of tests was selected by Dr. Kieth Brightly during initial consultation on 12/07/2020. This was communicated to the technician. Communication between the neuropsychologist and technician was ongoing throughout the testing session and changes were made as deemed necessary based on patient performance on testing, technician observations and additional pertinent factors such as those listed above.  Tests Administered: Clock Drawing Test Controlled Oral Word Association Test (COWAT; FAS & Animals)  Repeatable Battery for the Assessment of Neuropsychological Status (RBANS); Form A  Results:  COWAT FAS Total = 8 Z=-1.91 Animals Total = 8 Z= -1.74   Clock Drawing Test Numbers Sequence = 1 Presence = 1 Location = 1 Contour Presence = 1 Accommodation = 1 Closure = 1 Symmetry = 1 Hands Presence = 1 Connection = 1 Proportion = 1 Target = 0   Results from the RBANS (Form A) will be included in final report.   Feedback to Patient: ALYVIAH CRANDLE will return on 04/02/2021 for an interactive feedback session with Dr. Kieth Brightly at which time her test performances, clinical impressions and treatment recommendations will be  reviewed in detail. The patient understands she can contact our office should she require our assistance before this time.  60 minutes spent face-to-face with patient administering standardized tests, 30 minutes spent scoring Radiographer, therapeutic). [CPT [id number], [id number]]  Full report to follow.

## 2020-12-27 DIAGNOSIS — E782 Mixed hyperlipidemia: Secondary | ICD-10-CM | POA: Diagnosis not present

## 2020-12-27 DIAGNOSIS — Z8673 Personal history of transient ischemic attack (TIA), and cerebral infarction without residual deficits: Secondary | ICD-10-CM | POA: Diagnosis not present

## 2020-12-27 DIAGNOSIS — Z7984 Long term (current) use of oral hypoglycemic drugs: Secondary | ICD-10-CM | POA: Diagnosis not present

## 2020-12-27 DIAGNOSIS — E1169 Type 2 diabetes mellitus with other specified complication: Secondary | ICD-10-CM | POA: Diagnosis not present

## 2020-12-27 DIAGNOSIS — E1151 Type 2 diabetes mellitus with diabetic peripheral angiopathy without gangrene: Secondary | ICD-10-CM | POA: Diagnosis not present

## 2020-12-27 DIAGNOSIS — I4892 Unspecified atrial flutter: Secondary | ICD-10-CM | POA: Diagnosis not present

## 2020-12-27 DIAGNOSIS — I4891 Unspecified atrial fibrillation: Secondary | ICD-10-CM | POA: Diagnosis not present

## 2020-12-27 DIAGNOSIS — E1140 Type 2 diabetes mellitus with diabetic neuropathy, unspecified: Secondary | ICD-10-CM | POA: Diagnosis not present

## 2020-12-27 DIAGNOSIS — I10 Essential (primary) hypertension: Secondary | ICD-10-CM | POA: Diagnosis not present

## 2021-01-04 ENCOUNTER — Encounter: Payer: Self-pay | Admitting: Psychology

## 2021-01-04 ENCOUNTER — Other Ambulatory Visit: Payer: Self-pay

## 2021-01-04 ENCOUNTER — Encounter (HOSPITAL_BASED_OUTPATIENT_CLINIC_OR_DEPARTMENT_OTHER): Payer: Medicare HMO | Admitting: Psychology

## 2021-01-04 DIAGNOSIS — F0280 Dementia in other diseases classified elsewhere without behavioral disturbance: Secondary | ICD-10-CM | POA: Diagnosis not present

## 2021-01-04 DIAGNOSIS — R413 Other amnesia: Secondary | ICD-10-CM | POA: Diagnosis not present

## 2021-01-04 DIAGNOSIS — F0150 Vascular dementia without behavioral disturbance: Secondary | ICD-10-CM

## 2021-01-04 NOTE — Progress Notes (Signed)
Neuropsychological Evaluation   Patient:  [name]   DOB: November 04, 1932  MR Number: [id number]  Location: [facility] PHYSICAL MEDICINE AND REHABILITATION [address] [place], [address] [id number] [facility] [place] [address] Dept: [phone]  Start: 8 AM End: 9 AM  Provider/Observer:     [name]  Chief Complaint:      Chief Complaint  Patient presents with   Memory Loss    Reason For Service:      [name] is an 87 year old female who is referred for neuropsychological evaluation by the patient's PCP [name], MD due to reports of progressive memory loss and cognitive changes.  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  The patient has also reported to have had 2 TIAs/"mini stroke" by her daughter.  The daughter reports that the first 1 occurred sometime around 1 and the second in the early 90s with hospitalization from the 86 events.  Medical records show that she had a stroke in 1999 but no pertinent data was available.  The patient has also had DVTs in the past.  The patient's daughter reports that these TIAs occurred during times of high blood pressure and the patient's blood pressure has been managed much more effectively over the next years and there have been no more strokelike events noted.  The patient's daughter describes current symptoms.  Patient's daughter reports that the patient will forget new information and events that have happened recently.  The daughter reports that there are good days and bad days and the patient will stress her self when trying to remember things acutely but if you give her a few  minutes that she will often remember the information.  If her recall is demanded on a fast pace, the patient has great difficulty remembering.  These changes were noted over the past year but it been progressively worsening.  1 stressor for the patient noted is that her younger sister (4 years younger) has been diagnosed with dementia and Alzheimer's and the patient worries about her.  Other symptoms noted include some issues of word finding difficulties.  The patient is described to have good geographic orientation and will know where she is and also be able to identify changes in locations over the years.  There are no visual hallucinations or tremors reported.  The patient and her daughter both deny any history of concussive events and no loss of consciousness in the past and no seizures.  While the patient worked in the [employer] there is a denial of working around any type of solvents or other particulate toxic chemicals.  The patient's blood pressure is reported to be up and down at times but is generally well managed.  The patient has type 2 diabetes.  She initially developed gestational diabetes with her pregnancy with her son but then this resolved.  Return of diabetes (type 2 diabetes) developed after 67.  The patient denies any issues with mood disturbance and denies any significant anxiety or depression.  She does have some concerns around her sister who is developing progressive memory loss and been diagnosed with Alzheimer's and the patient also has a regular fear of falling without falls while walking.  The patient has had syncopal event  when getting up out of bed on a couple of occasions and falling at that time but no falling when simply walking around.  There is a significant family history of stroke and heart attack.  The patient is reported to go to bed quite early at night she will often go to bed by 9 PM.  The patient reports that sometimes she has difficulty falling to sleep but  will go to sleep at a reasonable time.  The patient reports that sometimes she wakes up in the middle of the night but is able to go back to sleep after about brief period of time.  The patient wakes up at 7 AM and reports that she feels rested in the morning.  The patient describes her appetite is very good and that she drinks water on a regular basis during the day.  The patient has regular interaction in contact with her daughter who keeps an eye on the patient.  Tests Administered:  Clock Drawing Test Controlled Oral Word Association Test (COWAT; FAS & Animals)  Repeatable Battery for the Assessment of Neuropsychological Status (RBANS); Form A  Participation Level:   Active  Participation Quality:  Appropriate      Behavioral Observation:  The patient appeared well-groomed and appropriately dressed. Her manners were polite and appropriate to the situation.  Well Groomed, Alert, and Appropriate.   Test Results:   Initially, the patient was administered the controlled oral Word Association test which assesses expressive language fluency.  The patient had mild to moderate deficits for both phonetic naming and targeted naming functions.  On the FAS test the patient was only able to produce a total of 8 words over the 3 trials for each of the letters FA and S.  This is nearly 2 standard deviations below normative expectations when matched for her age and educational levels.  On the animal naming test the patient also showed significant weakness and produced a total of 8 animal names over 62nd period of time which is moderately impaired relative to a normative population.  This pattern of performance does suggest difficulties with quickly retrieving words.  She did not have significant indications of repetitive responses but did have difficulty with self generation of targeted naming capacity.  COWAT FAS Total = 8 Z=-1.91 Animals Total = 8 Z= -1.74     RBANS A:    Total Index Score:   48  The patient was also administered the repeatable battery for the assessment of neuropsychological status which assesses a broad range of cognitive domains.  The patient produced a total score index of 48 which is in the extremely low range and suggest that multiple areas of cognitive functioning are significantly impaired when compared to normative expectations.  This pattern strongly suggest general cognitive impairment and all domains assessed including issues with attention, memory, language, constructional and visual spatial abilities.     Language Indes:  60 Semantic Fluency2 Picture Naming3-9  As displayed on the control oral Word Association test the patient showed significant deficits with regard to expressive language capacity on the RBANS test.  The patient semantic fluency was severely impaired and her targeted naming capacity was also significantly and severely impaired.  The patient had difficulty with free recall of words as well as targeted naming capacity.  This would pose some difficulty with her ability to respond to various questions and indicates significant difficulty of retrieving words in a time-based setting consistent with subjective reports.      Attention Index:  46 Coding1 Digit Span3  The patient produced attention index score of 46 which is severely impaired.  This measure simple auditory registration as well as focus execute abilities of attention related to visual scanning and information processing speed.  The patient shows significant impairments with regard to information processing speed and executive functioning capacity as well as severe deficits with regard to her ability to initially encode new auditory information.  This level of impairment will affect all aspects of ADLs.        Immediate Memory Index:  61  List Learning3 Story Memory4  The patient produced an immediate memory index score of 61 which falls in the extremely low range relative to a  normative population.  While the level of immediate memory deficits were better than would be predicted based on her severe deficits with regard to auditory encoding this does indicate deficits not only with encoding but also with learning new information.  Individual subtest making up the immediate memory index score were all significantly impaired including list learning and story learning.  The patient had difficulty encoding, storing and organizing new information for immediate recall.  This is a significantly impaired score for learning new information.  This pattern would suggest that she would have great difficulty managing her medications and managing other day-to-day activities as well as difficulty managing appointments, cooking and driving and essentially all areas of ADLs.     Delayed Memory Index:  44 List Recall3-9 Story Recall3 Figure Recall2 List Recognition<=2  The patient produced a delayed memory index score 44 which is significantly impaired and indicates severe deficits with regard to delayed recall and recognition of verbal and visual information.  Individual subtest were all severely impaired with regard to list learning recall, story learning recall, visual memory for figure recall.  The patient also showed severe deficits with regard to recognition/cued recall of information would suggest that not only are these impairments related to ability to self generate and retrieve information but that there are severe and significant deficits for her ability to store and organize new learned information.       Visuospatial / Constructional Index:  56 Figure Copy2 Line Orientation<=2  The patient produced a visual constructional index score of 56 which is a severely impaired score.  This measure assesses basic visual spatial perception and ability to copy a design from a model.  The patient displayed difficulties with processing and using visual spatial information.  There was no  variability in subtest performance suggesting that these deficits are clearly directly related to visual-spatial processing.  The patient was also administered the clock drawing test which showed similar and consistent deficits.  While the patient was able to understand the instructions and produced a general representation of a clock she showed significant difficulties and proper construction including deficits properly orienting numbers, producing a contour of the clock itself and deficits properly placing the hands on the clock.  This pattern is consistent with deficits on the constructional index score of the RBANS.  Clock Drawing Test Numbers Sequence = 1 Presence = 1 Location = 1 Contour Presence = 1 Accommodation = 1 Closure = 1 Symmetry = 1 Hands Presence = 1 Connection = 1 Proportion = 1 Target = 0          Impression/Diagnosis:   Overall, the results of the current neuropsychological evaluation are quite consistent across the board indicating significant and severe deficits in all cognitive domains assessed.  The patient shows deficits with regard to  expressive language functioning including rapid recall of words and language.  The patient shows significant deficits with regard to attention including both auditory encoding deficits as well as focus execute deficits related to information processing speed and visual scanning and visual searching abilities.  The patient shows significant deficits with regard to visual-spatial and visual constructional abilities on all measures assessed and showed significant deficits in memory and learning both visual and auditorily.  The patient's ability to effectively encode new information, organize and store information were all severely impaired not only after periods of delay but also after immediate presentation of new information.  On recognition/cued recall measures the patient did not show any improvement in her retrieval of previously  presented information which would strongly suggest that this is not simply a retrieval deficit but is also related to an inability to store and organize new information effectively.  As far as diagnostic consideration the patient does meet the criterion for a formal diagnosis of dementia (significant loss of function i.e. major neurocognitive disorder).  The patient shows severe memory impairments plus multiple domains of cognitive disturbance including deficits for expressive language, impaired motor activities and impaired visual-spatial abilities, difficulties naming and recognizing specific objects effectively and deficits in attention, planning, or evaluating and abstract thoughts.  These deficits will clearly produce significant impairments in social/occupational functioning and are declining from a previous level of function.  The patient would clearly meet the diagnostic criterion for major neurocognitive disorder due to her general medical status.  The level and nature of her cognitive deficits along with her medical history and subjective complaints would suggest that this is likely due to a vascular dementia.  The patient has had strokes as far back as the 1980s and 1990s and while she has had no significant stroke event after proper management of blood pressure I suspect that there is significant and widespread microvascular disease.  This is much more likely to be a vascular dementia than 1 of Alzheimer's.  The patient is showing good geographic orientation and is maintaining expressive language with the exception of significant fluency deficits.  There are no indications of significant tremor, auditory or visual hallucinations or other behavioral disturbance.  I would suggest that it would be appropriate to have a repeat MRI with and without contrast performed as I see no indication in her medical records at this point with any imaging done since 2011.  Focused care should continue regarding her  cerebrovascular concerns.  I will sit down with the patient and her family regarding recommendations going forward.  Particular support will be needed due to the level and severity of attentional deficits, visual-spatial deficits, memory and learning deficits and executive functioning deficits.  The level of impairment noted will strongly indicate the patient will need daily care and observation and is really functional level that would make living independent even if she is regularly checked on a safety concern.  The patient should not be driving at all and should avoid day-to-day activities where there is particular risk of significant harm or danger when attention, visual-spatial or memory functions are needed for safe execution of the task.  Diagnosis:    Major neurocognitive disorder due to another medical condition without behavioral disturbance [facility])  Vascular dementia without behavioral disturbance (HCC)   _____________________ Arley Phenix, Psy.D. Clinical Neuropsychologist

## 2021-01-08 DIAGNOSIS — L84 Corns and callosities: Secondary | ICD-10-CM | POA: Diagnosis not present

## 2021-01-08 DIAGNOSIS — I739 Peripheral vascular disease, unspecified: Secondary | ICD-10-CM | POA: Diagnosis not present

## 2021-01-08 DIAGNOSIS — L603 Nail dystrophy: Secondary | ICD-10-CM | POA: Diagnosis not present

## 2021-01-08 DIAGNOSIS — E1151 Type 2 diabetes mellitus with diabetic peripheral angiopathy without gangrene: Secondary | ICD-10-CM | POA: Diagnosis not present

## 2021-01-12 DIAGNOSIS — E1169 Type 2 diabetes mellitus with other specified complication: Secondary | ICD-10-CM | POA: Diagnosis not present

## 2021-01-12 DIAGNOSIS — E7849 Other hyperlipidemia: Secondary | ICD-10-CM | POA: Diagnosis not present

## 2021-01-12 DIAGNOSIS — I480 Paroxysmal atrial fibrillation: Secondary | ICD-10-CM | POA: Diagnosis not present

## 2021-01-12 DIAGNOSIS — I10 Essential (primary) hypertension: Secondary | ICD-10-CM | POA: Diagnosis not present

## 2021-01-17 DIAGNOSIS — E1169 Type 2 diabetes mellitus with other specified complication: Secondary | ICD-10-CM | POA: Diagnosis not present

## 2021-01-17 DIAGNOSIS — I4891 Unspecified atrial fibrillation: Secondary | ICD-10-CM | POA: Diagnosis not present

## 2021-01-17 DIAGNOSIS — E1140 Type 2 diabetes mellitus with diabetic neuropathy, unspecified: Secondary | ICD-10-CM | POA: Diagnosis not present

## 2021-01-17 DIAGNOSIS — I10 Essential (primary) hypertension: Secondary | ICD-10-CM | POA: Diagnosis not present

## 2021-01-17 DIAGNOSIS — E1151 Type 2 diabetes mellitus with diabetic peripheral angiopathy without gangrene: Secondary | ICD-10-CM | POA: Diagnosis not present

## 2021-01-17 DIAGNOSIS — E782 Mixed hyperlipidemia: Secondary | ICD-10-CM | POA: Diagnosis not present

## 2021-01-17 DIAGNOSIS — Z8673 Personal history of transient ischemic attack (TIA), and cerebral infarction without residual deficits: Secondary | ICD-10-CM | POA: Diagnosis not present

## 2021-01-17 DIAGNOSIS — Z7984 Long term (current) use of oral hypoglycemic drugs: Secondary | ICD-10-CM | POA: Diagnosis not present

## 2021-01-17 DIAGNOSIS — I4892 Unspecified atrial flutter: Secondary | ICD-10-CM | POA: Diagnosis not present

## 2021-01-23 DIAGNOSIS — F0280 Dementia in other diseases classified elsewhere without behavioral disturbance: Secondary | ICD-10-CM | POA: Diagnosis not present

## 2021-01-23 DIAGNOSIS — I4891 Unspecified atrial fibrillation: Secondary | ICD-10-CM | POA: Diagnosis not present

## 2021-01-23 DIAGNOSIS — Z23 Encounter for immunization: Secondary | ICD-10-CM | POA: Diagnosis not present

## 2021-01-23 DIAGNOSIS — E7849 Other hyperlipidemia: Secondary | ICD-10-CM | POA: Diagnosis not present

## 2021-01-23 DIAGNOSIS — E1169 Type 2 diabetes mellitus with other specified complication: Secondary | ICD-10-CM | POA: Diagnosis not present

## 2021-01-23 DIAGNOSIS — Z0000 Encounter for general adult medical examination without abnormal findings: Secondary | ICD-10-CM | POA: Diagnosis not present

## 2021-02-01 DIAGNOSIS — E1169 Type 2 diabetes mellitus with other specified complication: Secondary | ICD-10-CM | POA: Diagnosis not present

## 2021-02-01 DIAGNOSIS — Z7984 Long term (current) use of oral hypoglycemic drugs: Secondary | ICD-10-CM | POA: Diagnosis not present

## 2021-02-01 DIAGNOSIS — I4891 Unspecified atrial fibrillation: Secondary | ICD-10-CM | POA: Diagnosis not present

## 2021-02-01 DIAGNOSIS — E1140 Type 2 diabetes mellitus with diabetic neuropathy, unspecified: Secondary | ICD-10-CM | POA: Diagnosis not present

## 2021-02-01 DIAGNOSIS — Z8673 Personal history of transient ischemic attack (TIA), and cerebral infarction without residual deficits: Secondary | ICD-10-CM | POA: Diagnosis not present

## 2021-02-01 DIAGNOSIS — I10 Essential (primary) hypertension: Secondary | ICD-10-CM | POA: Diagnosis not present

## 2021-02-01 DIAGNOSIS — I4892 Unspecified atrial flutter: Secondary | ICD-10-CM | POA: Diagnosis not present

## 2021-02-01 DIAGNOSIS — E1151 Type 2 diabetes mellitus with diabetic peripheral angiopathy without gangrene: Secondary | ICD-10-CM | POA: Diagnosis not present

## 2021-02-01 DIAGNOSIS — E782 Mixed hyperlipidemia: Secondary | ICD-10-CM | POA: Diagnosis not present

## 2021-02-02 ENCOUNTER — Other Ambulatory Visit: Payer: Self-pay

## 2021-02-02 NOTE — Patient Outreach (Signed)
[facility]) Care Management  02/02/2021  [name] 09-Aug-1932 [id number]   Telephone call to patient for disease management follow up.   No answer.  HIPAA compliant voice message left.    Plan: If no return call, RN CM will attempt patient again in the month of January.    [name], RN, MSN [facility] Care Management Care Management Coordinator Direct Line [phone] Cell [phone] Toll Free: [phone]  Fax: [phone]

## 2021-02-14 ENCOUNTER — Encounter: Payer: Medicare HMO | Admitting: Psychology

## 2021-02-21 DIAGNOSIS — I6789 Other cerebrovascular disease: Secondary | ICD-10-CM | POA: Diagnosis not present

## 2021-02-21 DIAGNOSIS — Z23 Encounter for immunization: Secondary | ICD-10-CM | POA: Diagnosis not present

## 2021-02-21 DIAGNOSIS — I10 Essential (primary) hypertension: Secondary | ICD-10-CM | POA: Diagnosis not present

## 2021-02-21 DIAGNOSIS — E785 Hyperlipidemia, unspecified: Secondary | ICD-10-CM | POA: Diagnosis not present

## 2021-02-21 DIAGNOSIS — E1169 Type 2 diabetes mellitus with other specified complication: Secondary | ICD-10-CM | POA: Diagnosis not present

## 2021-02-21 DIAGNOSIS — I4892 Unspecified atrial flutter: Secondary | ICD-10-CM | POA: Diagnosis not present

## 2021-02-21 DIAGNOSIS — M130 Polyarthritis, unspecified: Secondary | ICD-10-CM | POA: Diagnosis not present

## 2021-02-27 DIAGNOSIS — H401112 Primary open-angle glaucoma, right eye, moderate stage: Secondary | ICD-10-CM | POA: Diagnosis not present

## 2021-02-27 DIAGNOSIS — H40012 Open angle with borderline findings, low risk, left eye: Secondary | ICD-10-CM | POA: Diagnosis not present

## 2021-03-07 ENCOUNTER — Encounter: Payer: Self-pay | Admitting: Psychology

## 2021-03-07 ENCOUNTER — Other Ambulatory Visit: Payer: Self-pay

## 2021-03-07 ENCOUNTER — Encounter: Payer: Medicare HMO | Attending: Psychology | Admitting: Psychology

## 2021-03-07 DIAGNOSIS — F0280 Dementia in other diseases classified elsewhere without behavioral disturbance: Secondary | ICD-10-CM | POA: Diagnosis not present

## 2021-03-07 DIAGNOSIS — F0150 Vascular dementia without behavioral disturbance: Secondary | ICD-10-CM | POA: Diagnosis not present

## 2021-03-07 DIAGNOSIS — R413 Other amnesia: Secondary | ICD-10-CM | POA: Insufficient documentation

## 2021-03-07 DIAGNOSIS — E785 Hyperlipidemia, unspecified: Secondary | ICD-10-CM | POA: Diagnosis not present

## 2021-03-07 LAB — LIPID PANEL
Chol/HDL Ratio: 2.4 ratio (ref 0.0–4.4)
Cholesterol, Total: 104 mg/dL (ref 100–199)
HDL: 43 mg/dL (ref >39)
LDL Chol Calc (NIH): 49 mg/dL (ref 0–99)
Triglycerides: 48 mg/dL (ref 0–149)
VLDL Cholesterol Cal: 12 mg/dL (ref 5–40)

## 2021-03-07 LAB — HEPATIC FUNCTION PANEL
ALT: 62 IU/L — ABNORMAL HIGH (ref 0–32)
AST: 31 IU/L (ref 0–40)
Albumin: 4.6 g/dL (ref 3.6–4.6)
Alkaline Phosphatase: 97 IU/L (ref 44–121)
Bilirubin Total: 0.3 mg/dL (ref 0.0–1.2)
Bilirubin, Direct: 0.12 mg/dL (ref 0.00–0.40)
Total Protein: 6.8 g/dL (ref 6.0–8.5)

## 2021-03-07 NOTE — Progress Notes (Signed)
03/07/2021 1 PM-2 PM:  Today's visit was an in person visit that was conducted in my outpatient clinic office with the patient and her daughter present.  Today we reviewed the results of the current neuropsychological evaluation.  Below of included a copy of the impressions and recommendations/diagnosis from this evaluation.  It can be found in its entirety in the patient's EMR dated 01/04/2021.  The patient appeared to understand the results and we were able to go over recommendations going forward.  The patient does meet the diagnostic criterion for major neurocognitive disorder due to her medical condition which is defined as vascular dementia without behavioral disturbance.  We talked about the need for her to be maintaining her overall health status including good physical activity, being very diligent of medications around blood pressure and other metabolic issues as well as sustained physical activity, good sleep and and remaining socially and cognitively active.   Impression/Diagnosis:                     Overall, the results of the current neuropsychological evaluation are quite consistent across the board indicating significant and severe deficits in all cognitive domains assessed.  The patient shows deficits with regard to expressive language functioning including rapid recall of words and language.  The patient shows significant deficits with regard to attention including both auditory encoding deficits as well as focus execute deficits related to information processing speed and visual scanning and visual searching abilities.  The patient shows significant deficits with regard to visual-spatial and visual constructional abilities on all measures assessed and showed significant deficits in memory and learning both visual and auditorily.  The patient's ability to effectively encode new information, organize and store information were all severely impaired not only after periods of delay but also after  immediate presentation of new information.  On recognition/cued recall measures the patient did not show any improvement in her retrieval of previously presented information which would strongly suggest that this is not simply a retrieval deficit but is also related to an inability to store and organize new information effectively.  As far as diagnostic consideration the patient does meet the criterion for a formal diagnosis of dementia (significant loss of function i.e. major neurocognitive disorder).  The patient shows severe memory impairments plus multiple domains of cognitive disturbance including deficits for expressive language, impaired motor activities and impaired visual-spatial abilities, difficulties naming and recognizing specific objects effectively and deficits in attention, planning, or evaluating and abstract thoughts.  These deficits will clearly produce significant impairments in social/occupational functioning and are declining from a previous level of function.  The patient would clearly meet the diagnostic criterion for major neurocognitive disorder due to her general medical status.  The level and nature of her cognitive deficits along with her medical history and subjective complaints would suggest that this is likely due to a vascular dementia.  The patient has had strokes as far back as the 1980s and 1990s and while she has had no significant stroke event after proper management of blood pressure I suspect that there is significant and widespread microvascular disease.  This is much more likely to be a vascular dementia than 1 of Alzheimer's.  The patient is showing good geographic orientation and is maintaining expressive language with the exception of significant fluency deficits.  There are no indications of significant tremor, auditory or visual hallucinations or other behavioral disturbance.  I would suggest that it would be appropriate to have a repeat MRI  with and without contrast  performed as I see no indication in her medical records at this point with any imaging done since 2011.  Focused care should continue regarding her cerebrovascular concerns.  I will sit down with the patient and her family regarding recommendations going forward.  Particular support will be needed due to the level and severity of attentional deficits, visual-spatial deficits, memory and learning deficits and executive functioning deficits.  The level of impairment noted will strongly indicate the patient will need daily care and observation and is really functional level that would make living independent even if she is regularly checked on a safety concern.  The patient should not be driving at all and should avoid day-to-day activities where there is particular risk of significant harm or danger when attention, visual-spatial or memory functions are needed for safe execution of the task.   Diagnosis:                                Major neurocognitive disorder due to another medical condition without behavioral disturbance [facility])   Vascular dementia without behavioral disturbance (HCC)     _____________________ Arley Phenix, Psy.D. Clinical Neuropsychologist

## 2021-03-12 ENCOUNTER — Encounter: Payer: Self-pay | Admitting: *Deleted

## 2021-03-26 ENCOUNTER — Ambulatory Visit: Payer: Medicare HMO | Admitting: Psychology

## 2021-04-02 ENCOUNTER — Ambulatory Visit: Payer: Medicare HMO | Admitting: Psychology

## 2021-04-12 ENCOUNTER — Other Ambulatory Visit: Payer: Self-pay | Admitting: *Deleted

## 2021-04-12 DIAGNOSIS — I779 Disorder of arteries and arterioles, unspecified: Secondary | ICD-10-CM

## 2021-04-18 DIAGNOSIS — L603 Nail dystrophy: Secondary | ICD-10-CM | POA: Diagnosis not present

## 2021-04-18 DIAGNOSIS — L84 Corns and callosities: Secondary | ICD-10-CM | POA: Diagnosis not present

## 2021-04-18 DIAGNOSIS — I739 Peripheral vascular disease, unspecified: Secondary | ICD-10-CM | POA: Diagnosis not present

## 2021-04-18 DIAGNOSIS — E1151 Type 2 diabetes mellitus with diabetic peripheral angiopathy without gangrene: Secondary | ICD-10-CM | POA: Diagnosis not present

## 2021-04-25 ENCOUNTER — Other Ambulatory Visit: Payer: Self-pay

## 2021-04-25 ENCOUNTER — Encounter: Payer: Self-pay | Admitting: Physician Assistant

## 2021-04-25 ENCOUNTER — Ambulatory Visit (HOSPITAL_COMMUNITY)
Admission: RE | Admit: 2021-04-25 | Discharge: 2021-04-25 | Disposition: A | Discharge status: Home / Self Care | Payer: Medicare HMO | Source: Ambulatory Visit | Attending: Vascular Surgery | Admitting: Vascular Surgery

## 2021-04-25 ENCOUNTER — Ambulatory Visit: Payer: Medicare HMO | Admitting: Physician Assistant

## 2021-04-25 VITALS — BP 149/74 | HR 63 | Temp 98.2°F | Resp 16 | Ht 62.0 in | Wt 84.5 lb

## 2021-04-25 DIAGNOSIS — I779 Disorder of arteries and arterioles, unspecified: Secondary | ICD-10-CM | POA: Insufficient documentation

## 2021-04-25 NOTE — Progress Notes (Signed)
[facility] [place] HISTORY AND PHYSICAL   History of Present Illness:  Patient is a 88 y.o. year old female who presents for evaluation of PAD.   S/P left above-knee to below-knee popliteal bypass. The patient has single vessel runoff via the posterior tibial artery previously.   The graft occluded shortly after it's placement.  She denies new symptoms of non healing wound, rest pain or claudication.    She recently had a mishap with her trash can that caused her to fall and hurt her back.  She states it is getting better and she is able to rest.    She is on Eliquis for Afib and  Lipitor daily.  Past Medical History:  Diagnosis Date   Atrial fibrillation (HCC)    Diabetes mellitus    DVT (deep venous thrombosis) (HCC)    Hyperlipidemia    Hypertension    Leg pain    Peripheral arterial disease (HCC)    Stroke (HCC) 1999    Past Surgical History:  Procedure Laterality Date   BACK SURGERY  1996   CARDIOVERSION N/A 02/15/2020   Procedure: CARDIOVERSION;  Surgeon: Vesta Mixer, MD;  Location: [facility];  Service: Cardiovascular;  Laterality: N/A;   CATARACT EXTRACTION  2000   CESAREAN SECTION     EYE SURGERY     FEMORAL BYPASS Left July 23, 2010   LUMBAR DISC SURGERY     SPINE SURGERY      ROS:   General:  No weight loss, Fever, chills  HEENT: No recent headaches, no nasal bleeding, no visual changes, no sore throat  Neurologic: No dizziness, blackouts, seizures. No recent symptoms of stroke or mini- stroke. No recent episodes of slurred speech, or temporary blindness.  Cardiac: No recent episodes of chest pain/pressure, no shortness of breath at rest.  No shortness of breath with exertion.  Denies history of atrial fibrillation or irregular heartbeat  Vascular: No history of rest pain in feet.  No history of claudication.  No history of non-healing ulcer, No history of DVT   Pulmonary: No home oxygen, no productive cough, no hemoptysis,  No  asthma or wheezing  Musculoskeletal:  [ ]  Arthritis, [ x] Low back pain,  [ ]  Joint pain  Hematologic:No history of hypercoagulable state.  No history of easy bleeding.  No history of anemia  Gastrointestinal: No hematochezia or melena,  No gastroesophageal reflux, no trouble swallowing  Urinary: [ ]  chronic Kidney disease, [ ]  on HD - [ ]  MWF or [ ]  TTHS, [ ]  Burning with urination, [ ]  Frequent urination, [ ]  Difficulty urinating;   Skin: No rashes  Psychological: No history of anxiety,  No history of depression  Social History Social History   Tobacco Use   Smoking status: Never   Smokeless tobacco: Never  Vaping Use   Vaping Use: Never used  Substance Use Topics   Alcohol use: No   Drug use: No    Family History Family History  Problem Relation Age of Onset   Hypertension Mother    Heart attack Mother    Diabetes Father    Hypertension Father    Stroke Father    Stroke Sister    Diabetes Sister    Heart disease Sister        Before age 73-Aneurysm   Dementia Sister    Alzheimer's disease Sister    Diabetes Brother    Diabetes Brother    Heart disease Brother  Before age 42   Heart attack Brother    Heart attack Sister    Diabetes Sister    Alzheimer's disease Sister     Allergies  No Known Allergies   Current Outpatient Medications  Medication Sig Dispense Refill   ACCU-CHEK SOFTCLIX LANCETS lancets      apixaban (ELIQUIS) 2.5 MG TABS tablet Take 1 tablet (2.5 mg total) by mouth 2 (two) times daily. 180 tablet 1   cholecalciferol (VITAMIN D3) 25 MCG (1000 UNIT) tablet Take 1,000 Units by mouth daily.     fish oil-omega-3 fatty acids 1000 MG capsule Take 1 g by mouth daily.     glipiZIDE (GLUCOTROL) 10 MG tablet Take 10 mg by mouth 2 (two) times daily before a meal.     JARDIANCE 25 MG TABS tablet Take 25 mg by mouth daily.     latanoprost (XALATAN) 0.005 % ophthalmic solution Place 1 drop into both eyes at bedtime.     linagliptin (TRADJENTA)  5 MG TABS tablet Take 1 tablet (5 mg total) by mouth daily. 90 tablet 3   lisinopril (ZESTRIL) 20 MG tablet Take 1 tablet (20 mg total) by mouth daily. 90 tablet 3   metoprolol tartrate (LOPRESSOR) 50 MG tablet Take 1 tablet (50 mg total) by mouth 2 (two) times daily. 180 tablet 3   Semaglutide (RYBELSUS) 3 MG TABS Take 3 mg by mouth daily.     TRUETEST TEST test strip      atorvastatin (LIPITOR) 40 MG tablet Take 1 tablet (40 mg total) by mouth daily. 90 tablet 3   diltiazem (CARDIZEM CD) 240 MG 24 hr capsule Take 1 capsule (240 mg total) by mouth daily. 90 capsule 3   ezetimibe (ZETIA) 10 MG tablet Take 1 tablet (10 mg total) by mouth daily. 90 tablet 3   No current facility-administered medications for this visit.    Physical Examination  Vitals:   04/25/21 1600  BP: (!) 149/74  Pulse: 63  Resp: 16  Temp: 98.2 F (36.8 C)  TempSrc: Temporal  SpO2: 98%  Weight: 84 lb 8 oz (38.3 kg)  Height: 5\' 2"  (1.575 m)    Body mass index is 15.46 kg/m.  General:  Alert and oriented, no acute distress HEENT: Normal Neck: No bruit or JVD Pulmonary: Clear to auscultation bilaterally Cardiac: Regular Rate and Rhythm without murmur Abdomen: Soft, non-tender, non-distended, no mass, no scars Skin: No rash Extremity Pulses:  2+ radial, brachial, femoral, right dorsalis pedis, non palpable left LE Musculoskeletal: No deformity or edema  Neurologic: Upper and lower extremity motor 5/5 and symmetric  DATA:      ABI Findings:  +---------+------------------+-----+--------+--------+   Right     Rt Pressure (mmHg) Index Waveform Comment    +---------+------------------+-----+--------+--------+   Brachial  162                                          +---------+------------------+-----+--------+--------+   PTA       >230               [place]    biphasic            +---------+------------------+-----+--------+--------+   DP        <230               [place]    biphasic             +---------+------------------+-----+--------+--------+  [place] 113                0.70                      +---------+------------------+-----+--------+--------+   +---------+------------------+-----+----------+-------+   Left      Lt Pressure (mmHg) Index Waveform   Comment   +---------+------------------+-----+----------+-------+   Brachial  161                                           +---------+------------------+-----+----------+-------+   PTA       >230               [place]    monophasic           +---------+------------------+-----+----------+-------+   DP        >230               [place]    monophasic           +---------+------------------+-----+----------+-------+   [place] 53                 0.33                       +---------+------------------+-----+----------+-------+   +-------+-----------+-----------+------------+------------+   ABI/TBI Today's ABI Today's TBI Previous ABI Previous TBI   +-------+-----------+-----------+------------+------------+   Right   [place]          0.70        [place]           0.69           +-------+-----------+-----------+------------+------------+   Left    [place]          0.33        [place]           0              +-------+-----------+-----------+------------+------------+      Arterial wall calcification precludes accurate ankle pressures and ABIs.     Summary:  Right: Resting right ankle-brachial index indicates noncompressible right  lower extremity arteries. The right toe-brachial index is normal.   Left: Resting left ankle-brachial index indicates noncompressible left  lower extremity arteries. The left toe-brachial index is abnormal.   ASSESSMENT/ PLAN:  Known PAD s/p left above knee to below knee popliteal artery bypass graft on 07/23/10, and her bypass has been occluded since 2012.  ABI's are [place] with left GT pressure of 53.  She remains asymptomatic without non healing wound, rest pain or claudications symptoms.  She remains active and walks  for exercise daily prior to her back injury.  We reviewed symptoms and if she develops ischemia she will call.  Otherwise she will f/u in 1 year for surveillance.      Mosetta Pigeon PA-C [facility] [place] Office: [phone]  MD in clinic [place]

## 2021-04-29 ENCOUNTER — Emergency Department (HOSPITAL_COMMUNITY): Payer: Medicare HMO

## 2021-04-29 ENCOUNTER — Encounter (HOSPITAL_COMMUNITY): Payer: Self-pay | Admitting: Emergency Medicine

## 2021-04-29 ENCOUNTER — Emergency Department (HOSPITAL_COMMUNITY)
Admission: EM | Admit: 2021-04-29 | Discharge: 2021-04-29 | Disposition: A | Discharge status: Home / Self Care | Payer: Medicare HMO | Attending: Emergency Medicine | Admitting: Emergency Medicine

## 2021-04-29 DIAGNOSIS — M5431 Sciatica, right side: Secondary | ICD-10-CM

## 2021-04-29 DIAGNOSIS — S32050G Wedge compression fracture of fifth lumbar vertebra, subsequent encounter for fracture with delayed healing: Secondary | ICD-10-CM

## 2021-04-29 DIAGNOSIS — Z7901 Long term (current) use of anticoagulants: Secondary | ICD-10-CM | POA: Diagnosis not present

## 2021-04-29 DIAGNOSIS — M545 Low back pain, unspecified: Secondary | ICD-10-CM | POA: Diagnosis not present

## 2021-04-29 DIAGNOSIS — M5136 Other intervertebral disc degeneration, lumbar region: Secondary | ICD-10-CM | POA: Diagnosis not present

## 2021-04-29 DIAGNOSIS — Z7984 Long term (current) use of oral hypoglycemic drugs: Secondary | ICD-10-CM | POA: Diagnosis not present

## 2021-04-29 DIAGNOSIS — W290XXA Contact with powered kitchen appliance, initial encounter: Secondary | ICD-10-CM | POA: Insufficient documentation

## 2021-04-29 DIAGNOSIS — M47816 Spondylosis without myelopathy or radiculopathy, lumbar region: Secondary | ICD-10-CM | POA: Diagnosis not present

## 2021-04-29 DIAGNOSIS — Z79899 Other long term (current) drug therapy: Secondary | ICD-10-CM | POA: Insufficient documentation

## 2021-04-29 DIAGNOSIS — S32050A Wedge compression fracture of fifth lumbar vertebra, initial encounter for closed fracture: Secondary | ICD-10-CM | POA: Diagnosis not present

## 2021-04-29 MED ORDER — TRAMADOL HCL 50 MG PO TABS: 50.0000 mg | ORAL_TABLET | Freq: Four times a day (QID) | ORAL | 0 refills | Status: DC | PRN

## 2021-04-29 MED ORDER — ONDANSETRON 4 MG PO TBDP
4.0000 mg | ORAL_TABLET | Freq: Once | ORAL | Status: AC
  Administered 2021-04-29: 4 mg via ORAL
  Filled 2021-04-29: qty 1

## 2021-04-29 MED ORDER — HYDROCODONE-ACETAMINOPHEN 5-325 MG PO TABS
1.0000 | ORAL_TABLET | Freq: Once | ORAL | Status: AC
  Administered 2021-04-29: 1 via ORAL
  Filled 2021-04-29: qty 1

## 2021-04-29 NOTE — ED Provider Notes (Signed)
[facility]   CSN: [id number] Arrival date & time: 04/29/21  0920     History  Chief Complaint  Patient presents with   Back Pain   Fall    [name] is a 88 y.o. female who presents with back injury. 1 week ago the patient was taking out her trash can when it began to fall.  She grabbed it and pulled and twisted her back.  She did not fall to the ground and did not have any and other injuries.  Since that time she has had constant pain in her lower back with episodes of severe pain that is described as aching and sharp radiating down her right leg.  The pain is made worse when she stands and walks or changes position.  It is better with rest.  She is never had anything like this before.  She denies weakness and is able to ambulate.  She is taking taken Tylenol without relief of her symptoms.  She denies saddle anesthesia, weakness of the lower extremities, bowel or bladder incontinence   Back Pain Fall      Home Medications Prior to Admission medications   Medication Sig Start Date End Date Taking? Authorizing Provider  ACCU-CHEK SOFTCLIX LANCETS lancets  01/16/18   [provider]  apixaban (ELIQUIS) 2.5 MG TABS tablet Take 1 tablet (2.5 mg total) by mouth 2 (two) times daily. 12/05/20   Lewayne Bunting, MD  atorvastatin (LIPITOR) 40 MG tablet Take 1 tablet (40 mg total) by mouth daily. 03/22/20 06/20/20  [name] Asters, NP  cholecalciferol (VITAMIN D3) 25 MCG (1000 UNIT) tablet Take 1,000 Units by mouth daily.    [provider]  diltiazem (CARDIZEM CD) 240 MG 24 hr capsule Take 1 capsule (240 mg total) by mouth daily. 11/23/20 02/21/21  Lewayne Bunting, MD  ezetimibe (ZETIA) 10 MG tablet Take 1 tablet (10 mg total) by mouth daily. 12/05/20 03/05/21  Lewayne Bunting, MD  fish oil-omega-3 fatty acids 1000 MG capsule Take 1 g by mouth daily.    [provider]  glipiZIDE (GLUCOTROL) 10 MG tablet  Take 10 mg by mouth 2 (two) times daily before a meal.    [provider]  JARDIANCE 25 MG TABS tablet Take 25 mg by mouth daily. 09/04/20   [provider]  latanoprost (XALATAN) 0.005 % ophthalmic solution Place 1 drop into both eyes at bedtime.    [provider]  linagliptin (TRADJENTA) 5 MG TABS tablet Take 1 tablet (5 mg total) by mouth daily. 05/20/17   Lewayne Bunting, MD  lisinopril (ZESTRIL) 20 MG tablet Take 1 tablet (20 mg total) by mouth daily. 11/23/20   Lewayne Bunting, MD  metoprolol tartrate (LOPRESSOR) 50 MG tablet Take 1 tablet (50 mg total) by mouth 2 (two) times daily. 11/23/20   Lewayne Bunting, MD  Semaglutide (RYBELSUS) 3 MG TABS Take 3 mg by mouth daily.    [provider]  TRUETEST TEST test strip  02/22/13   [provider]  iron polysaccharides (NIFEREX) 150 MG capsule Take 150 mg by mouth daily.    07/04/11  [provider]  topiramate (TOPAMAX) 15 MG capsule Take 15 mg by mouth 2 (two) times daily.    07/04/11  [provider]      Allergies    Patient has no known allergies.    Review of Systems   Review of Systems  Musculoskeletal:  Positive for back pain.  As per HPI Physical Exam Updated Vital Signs BP (!) 202/83 (BP Location: Left Arm) Comment: RN aware   Pulse 66    Temp 97.6 F (36.4 C) (Oral)    Resp 16    SpO2 98%   Physical Exam Vitals and nursing note reviewed.  Constitutional:      General: She is not in acute distress.    Appearance: She is well-developed. She is not diaphoretic.  HENT:     Head: Normocephalic and atraumatic.     Right Ear: External ear normal.     Left Ear: External ear normal.     Nose: Nose normal.     Mouth/Throat:     Mouth: Mucous membranes are moist.  Eyes:     General: No scleral icterus.    Conjunctiva/sclera: Conjunctivae normal.  Cardiovascular:     Rate and Rhythm: Normal rate and regular rhythm.     Heart sounds: Normal heart sounds. No murmur  heard.   No friction rub. No gallop.  Pulmonary:     Effort: Pulmonary effort is normal. No respiratory distress.     Breath sounds: Normal breath sounds.  Abdominal:     General: Bowel sounds are normal. There is no distension.     Palpations: Abdomen is soft. There is no mass.     Tenderness: There is no abdominal tenderness. There is no guarding.  Musculoskeletal:     Cervical back: Normal range of motion.     Comments: Patient appears to be in mild to moderate pain, antalgic gait noted. Lumbosacral spine area reveals no midline tenderness or mass. R paraspinal muscle are tender with palpable spasm. Painful and reduced LS ROM noted. Straight leg raise is positive at 45 degrees on right. DTR's, motor strength and sensation normal, dorsi and plantar flexion.  Peripheral pulses are palpable.   Skin:    General: Skin is warm and dry.  Neurological:     Mental Status: She is alert and oriented to person, place, and time.  Psychiatric:        Behavior: Behavior normal.    ED Results / Procedures / Treatments   Labs (all labs ordered are listed, but only abnormal results are displayed) Labs Reviewed - No data to display  EKG None  Radiology No results found.  Procedures Procedures    Medications Ordered in ED Medications - No data to display  ED Course/ Medical Decision Making/ A&P Clinical Course as of 04/29/21 1043  Sun Apr 29, 2021  1039 DG Lumbar Spine Complete I interpreted images of the lumbar spine that shows an acute lumbar compression fracture at L5.  CT imaging ordered [AH]    Clinical Course User Index [AH] Arthor Captain, PA-C                           Medical Decision Making  This patient presents to the ED for concern of back pain, this involves an extensive number of treatment options, and is a complaint that carries with it a high risk of complications and morbidity.  The differential diagnosis includes  The emergent differential diagnosis for back pain  includes but is not limited to fracture, muscle strain, cauda equina, spinal stenosis. DDD, ankylosing spondylitis, acute ligamentous injury, disk herniation, spondylolisthesis, Epidural compression syndrome, metastatic cancer, transverse myelitis, vertebral osteomyelitis, diskitis, kidney stone, pyelonephritis, AAA, Perforated ulcer, Retrocecal appendicitis, pancreatitis, bowel obstruction, retroperitoneal hemorrhage or mass, meningitis.  Co morbidities that complicate the patient evaluation  Age, diabetes, PAD   Additional history obtained:  Additional history obtained from family member at bedside External records from outside source obtained and reviewed including    Imaging Studies ordered:  I ordered imaging studies including a lumbar film and CT scan I independently visualized and interpreted imaging which showed less than 50% height loss lumbar vertebral body compression fracture and some bulging disks at L5-S1 I agree with the radiologist interpretation   Medicines ordered and prescription drug management:  I ordered medication including Vicodin for pain management Reevaluation of the patient after these medicines showed that the patient improved I have reviewed the patients home medicines and have made adjustments as needed    SPLINT APPLICATION  Authorized by: Arthor Captain Consent: Verbal consent obtained. Risks and benefits: risks, benefits and alternatives were discussed Consent given by: patient Splint applied by: orthopedic technician Location details: Lumbar Splint type: TLSO  Post-procedure: The splinted body part was neurovascularly unchanged following the procedure. Patient tolerance: Patient tolerated the procedure well with no immediate complications.     Problem List / ED Course:  Lumbar compression fracutre and sciatica   Reevaluation:  After the interventions noted above, I reevaluated the patient and found that they have  :improved   Social Determinants of Health:  good social support, OP PCP management   Dispostion:  After consideration of the diagnostic results and the patients response to treatment, I feel that the patent would benefit from discharge.    Final Clinical Impression(s) / ED Diagnoses Final diagnoses:  None    Rx / DC Orders ED Discharge Orders     None         Arthor Captain, PA-C 04/29/21 1441    Jacalyn Lefevre, MD 04/29/21 1512

## 2021-04-29 NOTE — Discharge Instructions (Signed)
Get help right away if: °You have difficulty breathing. °Your pain is very bad and it suddenly gets worse. °You have numbness, tingling, or weakness in any part of your body. °You are unable to empty your bladder. °You cannot control your bladder or bowels. °You are unable to move any body part (paralysis) that is below the level of your injury. °You vomit. °You have pain in your abdomen. °

## 2021-04-29 NOTE — ED Triage Notes (Signed)
Patient c/o back pain and right leg pain after fall x1 week ago. States trip and fall while pushing garbage can. Denies head injury and LOC. Take eliquis.

## 2021-04-29 NOTE — Progress Notes (Signed)
Orthopedic Tech Progress Note Patient Details:  [name] 1932/05/10 [id number]  Patient ID: [name], female   DOB: Jun 24, 1932, 88 y.o.   MRN: [id number] Placed a stat order with [facility] for TLSO brace.  Darleen Crocker 04/29/2021, 10:38 AM

## 2021-05-02 ENCOUNTER — Other Ambulatory Visit: Payer: Self-pay

## 2021-05-02 NOTE — Patient Outreach (Addendum)
[facility]) Care Management  05/02/2021  DNIYA NEUHAUS May 30, 1932 [id number]   Telephone Assessment    Unsuccessful outreach attempt to patient.      Plan: Assigned RN CM will make outreach attempt to patient within the month of Feb.  Amiee Wiley [facility]da [facility] Care Management Telephonic Care Management Coordinator Direct Phone: [phone] Toll Free: [phone] Fax: [phone]

## 2021-05-03 ENCOUNTER — Ambulatory Visit: Payer: Self-pay

## 2021-05-03 DIAGNOSIS — E1169 Type 2 diabetes mellitus with other specified complication: Secondary | ICD-10-CM | POA: Diagnosis not present

## 2021-05-03 DIAGNOSIS — I739 Peripheral vascular disease, unspecified: Secondary | ICD-10-CM | POA: Diagnosis not present

## 2021-05-03 DIAGNOSIS — S32008A Other fracture of unspecified lumbar vertebra, initial encounter for closed fracture: Secondary | ICD-10-CM | POA: Diagnosis not present

## 2021-05-03 NOTE — Progress Notes (Signed)
HPI: FU paroxysmal atrial fibrillation, atrial flutter and prior CVA. She had a Myoview performed on Aug 27, 2005, that showed an ejection fraction of 80% and normal perfusion. Patient followed by vascular surgery for peripheral vascular disease.  ABIs December 2021 noncompressible on the right and left. Echocardiogram November 2021 showed ejection fraction 50 to 55%, mild RV dysfunction.  Had cardioversion November 2021.  Since I last saw her, she denies dyspnea, chest pain, palpitations, syncope or bleeding.  Current Outpatient Medications  Medication Sig Dispense Refill   ACCU-CHEK SOFTCLIX LANCETS lancets      apixaban (ELIQUIS) 2.5 MG TABS tablet Take 1 tablet (2.5 mg total) by mouth 2 (two) times daily. 180 tablet 1   atorvastatin (LIPITOR) 40 MG tablet Take 1 tablet (40 mg total) by mouth daily. 90 tablet 3   diltiazem (CARDIZEM CD) 240 MG 24 hr capsule Take 1 capsule (240 mg total) by mouth daily. 90 capsule 3   ezetimibe (ZETIA) 10 MG tablet Take 1 tablet (10 mg total) by mouth daily. 90 tablet 3   fish oil-omega-3 fatty acids 1000 MG capsule Take 1 g by mouth daily.     glipiZIDE (GLUCOTROL) 10 MG tablet Take 10 mg by mouth 2 (two) times daily before a meal.     GVOKE HYPOPEN 2-PACK 0.5 MG/0.1ML SOAJ Inject into the skin.     LANTUS SOLOSTAR 100 UNIT/ML Solostar Pen SMARTSIG:10 Unit(s) SUB-Q Every Evening     latanoprost (XALATAN) 0.005 % ophthalmic solution Place 1 drop into both eyes at bedtime.     lisinopril (ZESTRIL) 20 MG tablet Take 1 tablet (20 mg total) by mouth daily. 90 tablet 3   metoprolol tartrate (LOPRESSOR) 50 MG tablet Take 1 tablet (50 mg total) by mouth 2 (two) times daily. 180 tablet 3   Multiple Vitamin (MULTIVITAMIN) tablet Take 1 tablet by mouth daily. Centrum silver     TRUETEST TEST test strip      cholecalciferol (VITAMIN D3) 25 MCG (1000 UNIT) tablet Take 1,000 Units by mouth daily. (Patient not taking: Reported on 05/16/2021)     JARDIANCE 25 MG TABS  tablet Take 25 mg by mouth daily. (Patient not taking: Reported on 05/16/2021)     linagliptin (TRADJENTA) 5 MG TABS tablet Take 1 tablet (5 mg total) by mouth daily. (Patient not taking: Reported on 05/16/2021) 90 tablet 3   Semaglutide (RYBELSUS) 3 MG TABS Take 3 mg by mouth daily. (Patient not taking: Reported on 05/16/2021)     traMADol (ULTRAM) 50 MG tablet Take 1 tablet (50 mg total) by mouth every 6 (six) hours as needed. (Patient not taking: Reported on 05/16/2021) 15 tablet 0   No current facility-administered medications for this visit.     Past Medical History:  Diagnosis Date   Atrial fibrillation ([place])    Diabetes mellitus    DVT (deep venous thrombosis) ([place])    Hyperlipidemia    Hypertension    Leg pain    Peripheral arterial disease ([place])    Stroke ([place]) 1999    Past Surgical History:  Procedure Laterality Date   BACK SURGERY  1996   CARDIOVERSION N/A 02/15/2020   Procedure: CARDIOVERSION;  Surgeon: Nahser, Wonda Cheng, MD;  Location: [facility];  Service: Cardiovascular;  Laterality: N/A;   CATARACT EXTRACTION  2000   CESAREAN SECTION     EYE SURGERY     FEMORAL BYPASS Left July 23, 2010   [place]  SURGERY      Social History   Socioeconomic History   Marital status: Widowed    Spouse name: Not on file   Number of children: 1   Years of education: Not on file   Highest education level: Not on file  Occupational History   Occupation: Retired    Comment: Engineer, site  Tobacco Use   Smoking status: Never   Smokeless tobacco: Never  Vaping Use   Vaping Use: Never used  Substance and Sexual Activity   Alcohol use: No   Drug use: No   Sexual activity: Not on file  Other Topics Concern   Not on file  Social History Narrative   Lives alone. Has daily contact with friends. Transportation by friends and sometimes [employer].   Social Determinants of Health   Financial Resource Strain: Not on file  Food  Insecurity: No Food Insecurity   Worried About Charity fundraiser in the Last Year: Never true   Ran Out of Food in the Last Year: Never true  Transportation Needs: No Transportation Needs   Lack of Transportation (Medical): No   Lack of Transportation (Non-Medical): No  Physical Activity: Not on file  Stress: Not on file  Social Connections: Not on file  Intimate Partner Violence: Not on file    Family History  Problem Relation Age of Onset   Hypertension Mother    Heart attack Mother    Diabetes Father    Hypertension Father    Stroke Father    Stroke Sister    Diabetes Sister    Heart disease Sister        Before age 78-Aneurysm   Dementia Sister    Alzheimer's disease Sister    Diabetes Brother    Diabetes Brother    Heart disease Brother        Before age 55   Heart attack Brother    Heart attack Sister    Diabetes Sister    Alzheimer's disease Sister     ROS: Some residual back pain from recent injury no fevers or chills, productive cough, hemoptysis, dysphasia, odynophagia, melena, hematochezia, dysuria, hematuria, rash, seizure activity, orthopnea, PND, pedal edema, claudication. Remaining systems are negative.  Physical Exam: Well-developed frail in no acute distress.  Skin is warm and dry.  HEENT is normal.  Neck is supple.  Chest is clear to auscultation with normal expansion.  Cardiovascular exam is regular rate and rhythm.  Abdominal exam nontender or distended. No masses palpated. Extremities show no edema. neuro grossly intact  ECG-normal sinus rhythm with PACs, normal axis, left ventricular hypertrophy, probable precordial lead reversal.  Personally reviewed  A/P  1 paroxysmal atrial fibrillation-patient remains in sinus rhythm today.  Plan to continue Cardizem and metoprolol for rate control if atrial fibrillation recurs.  Continue apixaban.    2 hypertension-patient's blood pressure is elevated; however she follows this at home and it is  typically controlled.  Continue present medications and follow-up.  3 hyperlipidemia-continue statin.   4 peripheral vascular disease-plan to continue statin.  She is not on aspirin given need for apixaban.  She is followed by vascular surgery.  Kirk Ruths, MD

## 2021-05-13 DIAGNOSIS — E7849 Other hyperlipidemia: Secondary | ICD-10-CM | POA: Diagnosis not present

## 2021-05-13 DIAGNOSIS — I10 Essential (primary) hypertension: Secondary | ICD-10-CM | POA: Diagnosis not present

## 2021-05-13 DIAGNOSIS — I480 Paroxysmal atrial fibrillation: Secondary | ICD-10-CM | POA: Diagnosis not present

## 2021-05-13 DIAGNOSIS — E1169 Type 2 diabetes mellitus with other specified complication: Secondary | ICD-10-CM | POA: Diagnosis not present

## 2021-05-16 ENCOUNTER — Ambulatory Visit: Payer: Medicare HMO | Admitting: Cardiology

## 2021-05-16 ENCOUNTER — Other Ambulatory Visit: Payer: Self-pay

## 2021-05-16 ENCOUNTER — Encounter: Payer: Self-pay | Admitting: Cardiology

## 2021-05-16 VITALS — BP 160/72 | HR 72 | Ht 62.0 in | Wt 90.2 lb

## 2021-05-16 DIAGNOSIS — I480 Paroxysmal atrial fibrillation: Secondary | ICD-10-CM

## 2021-05-16 DIAGNOSIS — I779 Disorder of arteries and arterioles, unspecified: Secondary | ICD-10-CM

## 2021-05-16 DIAGNOSIS — E785 Hyperlipidemia, unspecified: Secondary | ICD-10-CM

## 2021-05-16 DIAGNOSIS — I10 Essential (primary) hypertension: Secondary | ICD-10-CM

## 2021-05-16 NOTE — Patient Instructions (Signed)

## 2021-05-17 ENCOUNTER — Ambulatory Visit: Payer: Medicare HMO | Admitting: Psychology

## 2021-06-06 DIAGNOSIS — I10 Essential (primary) hypertension: Secondary | ICD-10-CM | POA: Diagnosis not present

## 2021-06-06 DIAGNOSIS — I4891 Unspecified atrial fibrillation: Secondary | ICD-10-CM | POA: Diagnosis not present

## 2021-06-06 DIAGNOSIS — E1169 Type 2 diabetes mellitus with other specified complication: Secondary | ICD-10-CM | POA: Diagnosis not present

## 2021-06-06 DIAGNOSIS — R634 Abnormal weight loss: Secondary | ICD-10-CM | POA: Diagnosis not present

## 2021-06-06 DIAGNOSIS — F0280 Dementia in other diseases classified elsewhere without behavioral disturbance: Secondary | ICD-10-CM | POA: Diagnosis not present

## 2021-06-06 DIAGNOSIS — E782 Mixed hyperlipidemia: Secondary | ICD-10-CM | POA: Diagnosis not present

## 2021-06-06 DIAGNOSIS — R410 Disorientation, unspecified: Secondary | ICD-10-CM | POA: Diagnosis not present

## 2021-06-07 ENCOUNTER — Other Ambulatory Visit: Payer: Self-pay | Admitting: Cardiology

## 2021-06-07 DIAGNOSIS — I480 Paroxysmal atrial fibrillation: Secondary | ICD-10-CM

## 2021-06-07 NOTE — Telephone Encounter (Signed)
Prescription refill request for Eliquis received. Indication:Afib Last office visit:2/23 Scr:0.9 Age: 88 Weight:40.9 kg  Prescription refilled

## 2021-06-11 ENCOUNTER — Other Ambulatory Visit: Payer: Self-pay

## 2021-06-11 NOTE — Patient Outreach (Signed)
[facility]) Care Management  06/11/2021  [name] 14-Sep-1932 [id number]   Telephone call to patient for disease management follow up.   No answer.  HIPAA compliant voice message left.    Plan: If no return call, RN CM will attempt patient again in the month of May.  Jone Baseman, RN, MSN [facility] Care Management Care Management Coordinator Direct Line [phone] Toll Free: [phone]  Fax: [phone]

## 2021-06-12 DIAGNOSIS — I480 Paroxysmal atrial fibrillation: Secondary | ICD-10-CM | POA: Diagnosis not present

## 2021-06-12 DIAGNOSIS — I10 Essential (primary) hypertension: Secondary | ICD-10-CM | POA: Diagnosis not present

## 2021-06-12 DIAGNOSIS — E1169 Type 2 diabetes mellitus with other specified complication: Secondary | ICD-10-CM | POA: Diagnosis not present

## 2021-06-12 DIAGNOSIS — E7849 Other hyperlipidemia: Secondary | ICD-10-CM | POA: Diagnosis not present

## 2021-06-13 ENCOUNTER — Other Ambulatory Visit: Payer: Self-pay

## 2021-06-13 NOTE — Patient Instructions (Signed)
Patient Goals/Self-Care Activities: Diabetes ?Take all medications as prescribed ?Attend all scheduled provider appointments ?check blood sugar at prescribed times: twice daily ?check feet daily for cuts, sores or redness ?enter blood sugar readings and medication or insulin into daily log ?take the blood sugar log to all doctor visits ?

## 2021-06-13 NOTE — Patient Outreach (Signed)
[facility]) Care Management ? ?06/13/2021 ? ?[name] ?09/25/1932 ?[id number] ? ? ?Incoming call from patient and caregiver [name].  Patient doing well. Patient now on insulin as A1c was high. Blood sugars ranging in the low 100's.  Discussed diabetes management.  Patient has some memory  problems but still able to care for self and take medications on her own.  No concerns.   ? ?Care Plan : Diabetes Type 2 (Adult)  ?Updates made by Fleeta Emmer, RN since 06/13/2021 12:00 AM  ?  ? ?Problem: Disease Progression (Diabetes, Type 2)   ?  ? ?Long-Range Goal: Disease Progression Prevented or Minimized as evidenced by A1c of less than 8.5 Completed 06/13/2021  ?Start Date: 02/17/2020  ?Expected End Date: 04/14/2021  ?Recent Progress: Not on track  ?Priority: High  ?Note:   ?Evidence-based guidance:  ?Prepare patient for laboratory and diagnostic exams based on risk and presentation.  ?Encourage lifestyle changes, such as increased intake of plant-based foods, stress reduction, consistent physical activity and smoking cessation to prevent long-term complications and chronic disease.   ?Individualize activity and exercise recommendations while considering potential limitations, such as neuropathy, retinopathy or the ability to prevent hyperglycemia or hypoglycemia.   ?Prepare patient for use of pharmacologic therapy that may include antihypertensive, analgesic, prostaglandin E1 with periodic adjustments, based on presenting chronic condition and laboratory results.  ?Notes:  ?06/13/21 Duplicate goal ?  ? ?Task: Monitor and Manage Follow-up for Comorbidities Completed 06/13/2021  ?Due Date: 01/12/2021  ?Priority: Routine  ?Responsible User: Fleeta Emmer, RN  ?Note:   ?Care Management Activities:  ?  ?- healthy lifestyle promoted ?- response to pharmacologic therapy monitored ?- vital signs and trends reviewed  ?  ?Notes: Patient sugars in 100-120 range.  ? ?10/06/20 Diabetes Management Discussed: ?Medication  adherence ?Reviewed importance of limiting carbs such as rice, potatoes, breads, and pastas. Also discussed limiting sweets and sugary drinks.  Discussed importance of portion control.  Also discussed importance of annual exams, foot exams, and eye exams.   ?12/01/20 Patient most recent A1c 11.9.  Caregiver reports patient sometimes eats what she is not supposed to eat.  Discussed diabetes management. ?  ? ?Care Plan : Atrial Fibrillation (Adult)  ?Updates made by Fleeta Emmer, RN since 06/13/2021 12:00 AM  ?Completed 06/13/2021  ? ?Problem: Dysrhythmia (Atrial Fibrillation) Resolved 06/13/2021  ?Priority: High  ?Onset Date: 02/17/2020  ?  ? ?Care Plan : RN Care Manager Plan of Care  ?Updates made by Fleeta Emmer, RN since 06/13/2021 12:00 AM  ?  ? ?Problem: Chronic Disease Management and Care Coordination Needs of Diabetes   ?Priority: High  ?  ? ?Long-Range Goal: Development of plan of care for the management of Diabetes   ?Start Date: 06/13/2021  ?Expected End Date: 04/14/2022  ?Priority: High  ?Note:   ?Current Barriers:  ?Chronic Disease Management support and education needs related to DMII  ? ?RNCM Clinical Goal(s):  ?Patient will verbalize basic understanding of  DMII disease process and self health management plan as evidenced by blood sugars less  through collaboration with RN Care manager, provider, and care team.  ? ?Interventions: ?Education and support related to diabetes ?Inter-disciplinary care team collaboration (see longitudinal plan of care) ?Evaluation of current treatment plan related to  self management and patient's adherence to plan as established by provider ? ? ?Diabetes Interventions:  (Status:  Goal on track:  Yes.) Long Term Goal ?Assessed patient's understanding of A1c goal: <7% ?Provided education to patient about  basic DM disease process ?Counseled on importance of regular laboratory monitoring as prescribed ?Review of patient status, including review of consultants reports, relevant laboratory  and other test results, and medications completed ?Lab Results  ?Component Value Date  ? HGBA1C 8.3 (H) 02/13/2020  ? ?Patient Goals/Self-Care Activities: Diabetes ?Take all medications as prescribed ?Attend all scheduled provider appointments ?check blood sugar at prescribed times: twice daily ?check feet daily for cuts, sores or redness ?enter blood sugar readings and medication or insulin into daily log ?take the blood sugar log to all doctor visits ? ?06/13/21 Patient A1c last was 11.6. Patient now on insulin.  Blood sugars in the low 100's per caregiver [name]. ? ?Follow Up Plan:  Telephone follow up appointment with care management team member scheduled for:  May ?The patient has been provided with contact information for the care management team and has been advised to call with any health related questions or concerns.  ? ?  ? ?Plan: Follow-up: Patient agrees to Care Plan and Follow-up. ?Follow-up in 2 months.   ? ?[name], RN, MSN ?[facility] Care Management ?Care Management Coordinator ?Direct Line [phone] ?Toll Free: [phone]  ?Fax: [phone] ? ?

## 2021-06-19 ENCOUNTER — Telehealth: Payer: Self-pay

## 2021-06-19 ENCOUNTER — Other Ambulatory Visit: Payer: Self-pay

## 2021-06-19 DIAGNOSIS — I779 Disorder of arteries and arterioles, unspecified: Secondary | ICD-10-CM

## 2021-06-19 NOTE — Patient Outreach (Addendum)
[facility]) Care Management ? ?06/19/2021 ? ?Sharyn Lull Fuhrman ?[birth year] ?[id number] ? ? ?Return telephone call to son Luba Matzen. No answer.  HIPAA compliant voice message left.   ? ?Plan: RN CM will wait return call.  ? ?Incoming call at 1:33 pm.  Spoke with son Kendell Bane who states he is looking for personal care services for his mom.  We discussed Medicaid and PCS.  He states patient income too high.  Discussed private pay.  He states he has a couple of agencies where he was referred to and will follow up with those once he is able to verify patient's income and see if she will be able to afford private pay.  CM advised that I would send out any information that I may have that may help.  He verbalized understanding.   ? ?Plan: RN CM will outreach at previously scheduled time.   ? ?[name], RN, MSN ?[facility] Care Management ?Care Management Coordinator ?Direct Line [phone] ?Toll Free: [phone]  ?Fax: [phone] ? ?

## 2021-06-19 NOTE — Telephone Encounter (Signed)
Mrs. Matthew son [employer] called stating patient has been complaining of worsening leg pains for the past 2 days. He states patient has had rest pains and pains while walking short distances (60ft). Kendell Bane states he hasn't noticed any skin changes, wounds,cold feet and/or swelling. I spoke with our in house PA Clinton Gallant to determine which studies she recommends patient to having prior to OV. Patient scheduled with ABI and OV with PA. Kendell Bane voiced his understanding. ?

## 2021-06-27 NOTE — Progress Notes (Signed)
?Office Note  ? ? ? ?CC:  follow up ?Requesting Provider:  [name], MD ? ?HPI: [name] is a 88 y.o. (July 02, 1932) female who presents for follow up of PAD. She has remote history of left above knee to below knee popliteal artery bypass graft on 07/23/10. Her bypass unfortunately occluded shortly after its placement. Her ABI's have been non compressible with dampened TBI's. Her symptoms thus far have been not lifestyle limiting. She explains that time of her last visit she was not having pain on that particular day but that the pain was intermittent. Now she says her pain is daily. Describes it as " just a pain". It is equal in both legs. She says she has not paid it much attention as far as if one hurts more than the other. The pain is in both the shin and calf of her legs. This occurs at rest. It is not worsened by ambulation. It does wake her up at night. She gets some relief with Diclofenac gel and also transdermal pain patches. She denies any pain in her thighs or feet. No non healing wounds.  ? ?The pt is on a statin for cholesterol management. She also takes Zetia ?The pt is not on a daily aspirin.   Other AC:  Eliquis for atrial fibrillation ?The pt is on ACE, CCB and BB for hypertension.   ?The pt is diabetic.   ?Tobacco hx:  never ? ?Past Medical History:  ?Diagnosis Date  ? Atrial fibrillation (HCC)   ? Diabetes mellitus   ? DVT (deep venous thrombosis) (HCC)   ? Hyperlipidemia   ? Hypertension   ? Leg pain   ? Peripheral arterial disease (HCC)   ? Stroke [facility]) 1999  ? ? ?Past Surgical History:  ?Procedure Laterality Date  ? BACK SURGERY  1996  ? CARDIOVERSION N/A 02/15/2020  ? Procedure: CARDIOVERSION;  Surgeon: Vesta Mixer, MD;  Location: [facility] ENDOSCOPY;  Service: Cardiovascular;  Laterality: N/A;  ? CATARACT EXTRACTION  2000  ? CESAREAN SECTION    ? EYE SURGERY    ? FEMORAL BYPASS Left July 23, 2010  ? LUMBAR DISC SURGERY    ? SPINE SURGERY    ? ? ?Social History  ? ?Socioeconomic History   ? Marital status: Widowed  ?  Spouse name: Not on file  ? Number of children: 1  ? Years of education: Not on file  ? Highest education level: Not on file  ?Occupational History  ? Occupation: Retired  ?  Comment: retail Chiropodist  ?Tobacco Use  ? Smoking status: Never  ? Smokeless tobacco: Never  ?Vaping Use  ? Vaping Use: Never used  ?Substance and Sexual Activity  ? Alcohol use: No  ? Drug use: No  ? Sexual activity: Not on file  ?Other Topics Concern  ? Not on file  ?Social History Narrative  ? Lives alone. Has daily contact with friends. Transportation by friends and sometimes [employer].  ? ?Social Determinants of Health  ? ?Financial Resource Strain: Not on file  ?Food Insecurity: No Food Insecurity  ? Worried About Programme researcher, broadcasting/film/video in the Last Year: Never true  ? Ran Out of Food in the Last Year: Never true  ?Transportation Needs: No Transportation Needs  ? Lack of Transportation (Medical): No  ? Lack of Transportation (Non-Medical): No  ?Physical Activity: Not on file  ?Stress: Not on file  ?Social Connections: Not on file  ?Intimate Partner Violence: Not  on file  ? ? ?Family History  ?Problem Relation Age of Onset  ? Hypertension Mother   ? Heart attack Mother   ? Diabetes Father   ? Hypertension Father   ? Stroke Father   ? Stroke Sister   ? Diabetes Sister   ? Heart disease Sister   ?     Before age 44-Aneurysm  ? Dementia Sister   ? Alzheimer's disease Sister   ? Diabetes Brother   ? Diabetes Brother   ? Heart disease Brother   ?     Before age 43  ? Heart attack Brother   ? Heart attack Sister   ? Diabetes Sister   ? Alzheimer's disease Sister   ? ? ?Current Outpatient Medications  ?Medication Sig Dispense Refill  ? ACCU-CHEK SOFTCLIX LANCETS lancets     ? ELIQUIS 2.5 MG TABS tablet TAKE 1 TABLET BY MOUTH TWICE A DAY 180 tablet 1  ? fish oil-omega-3 fatty acids 1000 MG capsule Take 1 g by mouth daily.    ? glipiZIDE (GLUCOTROL) 10 MG tablet Take 10 mg by mouth 2 (two) times  daily before a meal.    ? GVOKE HYPOPEN 2-PACK 0.5 MG/0.1ML SOAJ Inject into the skin.    ? LANTUS SOLOSTAR 100 UNIT/ML Solostar Pen SMARTSIG:10 Unit(s) SUB-Q Every Evening    ? latanoprost (XALATAN) 0.005 % ophthalmic solution Place 1 drop into both eyes at bedtime.    ? linagliptin (TRADJENTA) 5 MG TABS tablet Take 1 tablet (5 mg total) by mouth daily. 90 tablet 3  ? lisinopril (ZESTRIL) 20 MG tablet Take 1 tablet (20 mg total) by mouth daily. 90 tablet 3  ? metoprolol tartrate (LOPRESSOR) 50 MG tablet Take 1 tablet (50 mg total) by mouth 2 (two) times daily. 180 tablet 3  ? Multiple Vitamin (MULTIVITAMIN) tablet Take 1 tablet by mouth daily. Centrum silver    ? traMADol (ULTRAM) 50 MG tablet Take 1 tablet (50 mg total) by mouth every 6 (six) hours as needed. 15 tablet 0  ? atorvastatin (LIPITOR) 40 MG tablet Take 1 tablet (40 mg total) by mouth daily. 90 tablet 3  ? cholecalciferol (VITAMIN D3) 25 MCG (1000 UNIT) tablet Take 1,000 Units by mouth daily.    ? diltiazem (CARDIZEM CD) 240 MG 24 hr capsule Take 1 capsule (240 mg total) by mouth daily. 90 capsule 3  ? ezetimibe (ZETIA) 10 MG tablet Take 1 tablet (10 mg total) by mouth daily. 90 tablet 3  ? JARDIANCE 25 MG TABS tablet Take 25 mg by mouth daily.    ? ?No current facility-administered medications for this visit.  ? ? ?No Known Allergies ? ? ?REVIEW OF SYSTEMS:  ? ?[X]  denotes positive finding, [ ]  denotes negative finding ?Cardiac  Comments:  ?Chest pain or chest pressure:    ?Shortness of breath upon exertion:    ?Short of breath when lying flat:    ?Irregular heart rhythm:    ?    ?Vascular    ?Pain in calf, thigh, or hip brought on by ambulation:    ?Pain in feet at night that wakes you up from your sleep:     ?Blood clot in your veins:    ?Leg swelling:     ?    ?Pulmonary    ?Oxygen at home:    ?Productive cough:     ?Wheezing:     ?    ?Neurologic    ?Sudden weakness in arms or legs:     ?  Sudden numbness in arms or legs:     ?Sudden onset of  difficulty speaking or slurred speech:    ?Temporary loss of vision in one eye:     ?Problems with dizziness:     ?    ?Gastrointestinal    ?Blood in stool:     ?Vomited blood:     ?    ?Genitourinary    ?Burning when urinating:     ?Blood in urine:    ?    ?Psychiatric    ?Major depression:     ?    ?Hematologic    ?Bleeding problems:    ?Problems with blood clotting too easily:    ?    ?Skin    ?Rashes or ulcers:    ?    ?Constitutional    ?Fever or chills:    ? ? ?PHYSICAL EXAMINATION: ? ?Vitals:  ? 06/28/21 0947  ?BP: (!) 195/92  ?Pulse: 73  ?Temp: 98.1 ?F (36.7 ?C)  ?Weight: 86 lb 4.8 oz (39.1 kg)  ?Height: 5\' 2"  (1.575 m)  ? ? ?General:  WDWN in NAD; vital signs documented above ?Gait: Normal, ambulates with cane ?HENT: WNL, normocephalic ?Pulmonary: normal non-labored breathing , without wheezing ?Cardiac: irregular HR ?Abdomen: soft, NT, no masses ?Vascular Exam/Pulses: ? Right Left  ?Radial 2+ (normal) 2+ (normal)  ?Femoral 2+ (normal) 2+ (normal)  ?Popliteal Not palpable Not palpable  ?DP absent absent  ?PT absent absent  ? ?Extremities: without ischemic changes, without Gangrene , without cellulitis; without open wounds;  ?Musculoskeletal: no muscle wasting or atrophy  ?Neurologic: A&O X 3;  No focal weakness or paresthesias are detected ?Psychiatric:  The pt has Normal affect. ? ? ?Non-Invasive Vascular Imaging:   ?+-------+-----------+-----------+------------+------------+  ?ABI/TBIToday's ABIToday's TBIPrevious ABIPrevious TBI  ?+-------+-----------+-----------+------------+------------+  ?Right  [place]         0.40            [place]             0.70        ?+-------+-----------+-----------+------------+------------+  ?Left   [place]         0.17              [place]         0.33          ?+-------+-----------+-----------+------------+------------+  ?Right great toe pressure 36 mm Hg ?Left great toe pressure 84 mmHg ? ? ?ASSESSMENT/PLAN:: 88 y.o. female here for follow up for PAD. She has history of  failed LLE Ak to BK popliteal bypass. Her symptoms have been non disabling. Her symptoms are now worse with rest pain in both legs. Her ABIs today remain non compressible with significant decrease in TBIs bilaterally. Toe pressu

## 2021-06-28 ENCOUNTER — Encounter: Payer: Self-pay | Admitting: Physician Assistant

## 2021-06-28 ENCOUNTER — Telehealth: Payer: Self-pay | Admitting: Cardiology

## 2021-06-28 ENCOUNTER — Other Ambulatory Visit: Payer: Self-pay

## 2021-06-28 ENCOUNTER — Ambulatory Visit (HOSPITAL_COMMUNITY)
Admission: RE | Admit: 2021-06-28 | Discharge: 2021-06-28 | Disposition: A | Discharge status: Home / Self Care | Payer: Medicare HMO | Source: Ambulatory Visit | Attending: Physician Assistant | Admitting: Physician Assistant

## 2021-06-28 ENCOUNTER — Ambulatory Visit: Payer: Medicare HMO | Admitting: Physician Assistant

## 2021-06-28 VITALS — BP 195/92 | HR 73 | Temp 98.1°F | Ht 62.0 in | Wt 86.3 lb

## 2021-06-28 DIAGNOSIS — I779 Disorder of arteries and arterioles, unspecified: Secondary | ICD-10-CM | POA: Diagnosis not present

## 2021-06-28 MED ORDER — METOPROLOL TARTRATE 50 MG PO TABS: ORAL_TABLET | ORAL | 0 refills | Status: DC

## 2021-06-28 NOTE — Telephone Encounter (Signed)
?*  STAT* If patient is at the pharmacy, call can be transferred to refill team. ? ? ?1. Which medications need to be refilled? (please list name of each medication and dose if known) metoprolol tartrate (LOPRESSOR) 50 MG tablet ? ?2. Which pharmacy/location (including street and city if local pharmacy) is medication to be sent to? [id number] - [place], [place] - [address] ? ?3. Do they need a 30 day or 90 day supply? 1 week supply ? ? ?Patient is completely out of medication and won't get her refill from [facility] in the mail in time ?

## 2021-06-28 NOTE — Telephone Encounter (Signed)
Spoke with pt. 1 week supply of Metoprolol 50 mg was sent into her pharmacy per pts request.  ?

## 2021-07-04 DIAGNOSIS — I10 Essential (primary) hypertension: Secondary | ICD-10-CM | POA: Diagnosis not present

## 2021-07-04 DIAGNOSIS — F01B4 Vascular dementia, moderate, with anxiety: Secondary | ICD-10-CM | POA: Diagnosis not present

## 2021-07-04 DIAGNOSIS — E1169 Type 2 diabetes mellitus with other specified complication: Secondary | ICD-10-CM | POA: Diagnosis not present

## 2021-07-04 DIAGNOSIS — I4892 Unspecified atrial flutter: Secondary | ICD-10-CM | POA: Diagnosis not present

## 2021-07-11 ENCOUNTER — Other Ambulatory Visit: Payer: Self-pay

## 2021-07-11 ENCOUNTER — Ambulatory Visit (HOSPITAL_COMMUNITY)
Admission: RE | Admit: 2021-07-11 | Discharge: 2021-07-11 | Disposition: A | Discharge status: Home / Self Care | Payer: Medicare HMO | Attending: Vascular Surgery | Admitting: Vascular Surgery

## 2021-07-11 ENCOUNTER — Encounter (HOSPITAL_COMMUNITY)
Admission: RE | Disposition: A | Discharge status: Home / Self Care | Payer: Self-pay | Source: Home / Self Care | Attending: Vascular Surgery

## 2021-07-11 DIAGNOSIS — E785 Hyperlipidemia, unspecified: Secondary | ICD-10-CM | POA: Diagnosis not present

## 2021-07-11 DIAGNOSIS — I70223 Atherosclerosis of native arteries of extremities with rest pain, bilateral legs: Secondary | ICD-10-CM | POA: Insufficient documentation

## 2021-07-11 DIAGNOSIS — E1151 Type 2 diabetes mellitus with diabetic peripheral angiopathy without gangrene: Secondary | ICD-10-CM | POA: Diagnosis not present

## 2021-07-11 DIAGNOSIS — I4891 Unspecified atrial fibrillation: Secondary | ICD-10-CM | POA: Diagnosis not present

## 2021-07-11 DIAGNOSIS — Z8673 Personal history of transient ischemic attack (TIA), and cerebral infarction without residual deficits: Secondary | ICD-10-CM | POA: Insufficient documentation

## 2021-07-11 DIAGNOSIS — Z86718 Personal history of other venous thrombosis and embolism: Secondary | ICD-10-CM | POA: Insufficient documentation

## 2021-07-11 DIAGNOSIS — I509 Heart failure, unspecified: Secondary | ICD-10-CM | POA: Diagnosis not present

## 2021-07-11 DIAGNOSIS — Z7901 Long term (current) use of anticoagulants: Secondary | ICD-10-CM | POA: Diagnosis not present

## 2021-07-11 DIAGNOSIS — I110 Hypertensive heart disease with heart failure: Secondary | ICD-10-CM | POA: Insufficient documentation

## 2021-07-11 HISTORY — PX: ABDOMINAL AORTOGRAM W/LOWER EXTREMITY: CATH118223

## 2021-07-11 LAB — POCT I-STAT, CHEM 8
BUN: 24 mg/dL — ABNORMAL HIGH (ref 8–23)
Calcium, Ion: 1.27 mmol/L (ref 1.15–1.40)
Chloride: 102 mmol/L (ref 98–111)
Creatinine, Ser: 0.7 mg/dL (ref 0.44–1.00)
Glucose, Bld: 111 mg/dL — ABNORMAL HIGH (ref 70–99)
HCT: 40 % (ref 36.0–46.0)
Hemoglobin: 13.6 g/dL (ref 12.0–15.0)
Potassium: 4.2 mmol/L (ref 3.5–5.1)
Sodium: 142 mmol/L (ref 135–145)
TCO2: 30 mmol/L (ref 22–32)

## 2021-07-11 SURGERY — ABDOMINAL AORTOGRAM W/LOWER EXTREMITY
Anesthesia: LOCAL

## 2021-07-11 MED ORDER — HYDRALAZINE HCL 20 MG/ML IJ SOLN
INTRAMUSCULAR | Status: DC | PRN
  Administered 2021-07-11: 10 mg via INTRAVENOUS

## 2021-07-11 MED ORDER — FENTANYL CITRATE (PF) 100 MCG/2ML IJ SOLN
INTRAMUSCULAR | Status: DC | PRN
  Administered 2021-07-11 (×2): 25 ug via INTRAVENOUS

## 2021-07-11 MED ORDER — LIDOCAINE HCL (PF) 1 % IJ SOLN
INTRAMUSCULAR | Status: AC
  Filled 2021-07-11: qty 30

## 2021-07-11 MED ORDER — SODIUM CHLORIDE 0.9% FLUSH: 3.0000 mL | Freq: Two times a day (BID) | INTRAVENOUS | Status: DC

## 2021-07-11 MED ORDER — SODIUM CHLORIDE 0.9 % IV SOLN
INTRAVENOUS | Status: DC
Start: 2021-07-11 — End: 2021-07-11

## 2021-07-11 MED ORDER — SODIUM CHLORIDE 0.9% FLUSH: 3.0000 mL | INTRAVENOUS | Status: DC | PRN

## 2021-07-11 MED ORDER — FENTANYL CITRATE (PF) 100 MCG/2ML IJ SOLN
INTRAMUSCULAR | Status: AC
Start: 2021-07-11 — End: ?
  Filled 2021-07-11: qty 2

## 2021-07-11 MED ORDER — ONDANSETRON HCL 4 MG/2ML IJ SOLN: 4.0000 mg | Freq: Four times a day (QID) | INTRAMUSCULAR | Status: DC | PRN

## 2021-07-11 MED ORDER — HYDRALAZINE HCL 20 MG/ML IJ SOLN: 5.0000 mg | INTRAMUSCULAR | Status: DC | PRN

## 2021-07-11 MED ORDER — SODIUM CHLORIDE 0.9 % WEIGHT BASED INFUSION: 1.0000 mL/kg/h | INTRAVENOUS | Status: DC

## 2021-07-11 MED ORDER — SODIUM CHLORIDE 0.9 % IV SOLN: 250.0000 mL | INTRAVENOUS | Status: DC | PRN

## 2021-07-11 MED ORDER — HEPARIN (PORCINE) IN NACL 1000-0.9 UT/500ML-% IV SOLN
INTRAVENOUS | Status: DC | PRN
  Administered 2021-07-11 (×2): 500 mL

## 2021-07-11 MED ORDER — HEPARIN (PORCINE) IN NACL 1000-0.9 UT/500ML-% IV SOLN
INTRAVENOUS | Status: AC
  Filled 2021-07-11: qty 1000

## 2021-07-11 MED ORDER — IODIXANOL 320 MG/ML IV SOLN
INTRAVENOUS | Status: DC | PRN
  Administered 2021-07-11: 155 mL via INTRA_ARTERIAL

## 2021-07-11 MED ORDER — LABETALOL HCL 5 MG/ML IV SOLN: 10.0000 mg | INTRAVENOUS | Status: DC | PRN

## 2021-07-11 MED ORDER — MIDAZOLAM HCL 2 MG/2ML IJ SOLN
INTRAMUSCULAR | Status: DC | PRN
  Administered 2021-07-11: .5 mg via INTRAVENOUS

## 2021-07-11 MED ORDER — LIDOCAINE HCL (PF) 1 % IJ SOLN
INTRAMUSCULAR | Status: DC | PRN
  Administered 2021-07-11: 20 mL via INTRADERMAL

## 2021-07-11 MED ORDER — ACETAMINOPHEN 325 MG PO TABS: 650.0000 mg | ORAL_TABLET | ORAL | Status: DC | PRN

## 2021-07-11 MED ORDER — MIDAZOLAM HCL 2 MG/2ML IJ SOLN
INTRAMUSCULAR | Status: AC
  Filled 2021-07-11: qty 2

## 2021-07-11 SURGICAL SUPPLY — 12 items
CATH NAVICROSS ANG 65CM (CATHETERS) IMPLANT
CATH OMNI FLUSH 5F 65CM (CATHETERS) ×1 IMPLANT
CATHETER NAVICROSS ANG 65CM (CATHETERS) ×2
DEVICE CLOSURE MYNXGRIP 5F (Vascular Products) ×1 IMPLANT
GLIDEWIRE ADV .035X260CM (WIRE) ×1 IMPLANT
KIT MICROPUNCTURE NIT STIFF (SHEATH) ×1 IMPLANT
KIT PV (KITS) ×3 IMPLANT
SHEATH PINNACLE 5F 10CM (SHEATH) ×1 IMPLANT
SHEATH PROBE COVER 6X72 (BAG) ×1 IMPLANT
TRANSDUCER W/STOPCOCK (MISCELLANEOUS) ×3 IMPLANT
TRAY PV CATH (CUSTOM PROCEDURE TRAY) ×3 IMPLANT
WIRE BENTSON .035X145CM (WIRE) ×1 IMPLANT

## 2021-07-11 NOTE — Op Note (Signed)
? ? ?Patient name: [name] MRN: [id number] DOB: 1932/08/14 Sex: female ? ?07/11/2021 ?Pre-operative Diagnosis: Left lower extremity Rutherford 4 critical limb ischemia ?Post-operative diagnosis:  Same ?Surgeon:  Victorino Sparrow, MD ?Procedure Performed: ?1.  Ultrasound-guided micropuncture access of the right common femoral artery ?2.  Aortogram ?3.  Second-order cannulation, left lower extremity angiogram ?4.  Right lower extremity angiogram ?5.  Vice assisted closure-Mynx ?6.  Contrast volume: 155 ?7.  Sedation time: 35 minutes ? ? ?Indications:   ?Patient is an 88 year old female with known peripheral arterial disease.  She had a history of remote left AKA to BK popliteal artery bypass in 2012.  This occluded shortly after placement.  Symptoms at this point are benign lifestyle limiting.  She presented to the office recently with pain in bilateral lower extremities, left greater than right. She continues to ambulate, however not much. Palpable, right DP pulse, nonpalpable pulses in the right leg. The pain is appreciated at rest when sitting, but does not wake her up at night. No wounds. Pt with known lumber spine compression fractures. [name]'s pain in her feet is likely multifactorial.  ?Judging by ABIs and symptoms, there is absolutely an element of arterial insufficiency. Left leg PAD is defined as Rutherford 4 critical limb ischemia.  ?After discussing the risks and benefits of bilateral lower extremity angiogram with emphasis on the left, [name] elected to proceed. ? ?Findings:  ? ?Slow flow due to heart failure. ?Aortogram: Bilateral renal arteries patent, small infrarenal abdominal aorta.  No flow-limiting stenosis in the iliac system bilaterally. ? ?Right lower extremity: Greater than 50% stenosis of the common femoral artery, profunda femoris patent, multiple tandem stenoses in the superficial femoral artery, all appear less than 50%.  Popliteal artery patent with disease appreciated in the  tibioperoneal trunk, two-vessel runoff to the ankle -anterior tibial artery, peroneal artery.  The anterior tibial artery continues to off into the foot via the dorsalis pedis artery. ? ?Left lower extremity: Greater than 80% stenosis of the common femoral artery, patent profunda femoris, superficial femoral artery patent with multiple tandem flow-limiting stenoses with occlusion at the P1 segment of the popliteal artery.  There is reconstitution via collaterals of the distal posterior tibial artery.  This is atretic, and appears roughly 1 mm in size.  This runs off into the foot.  ?  ?Procedure:  The patient was identified in the holding area and taken to room 8.  The patient was then placed supine on the table and prepped and draped in the usual sterile fashion.  A time out was called.  Ultrasound was used to evaluate the right common femoral artery.  It was patent .  A digital ultrasound image was acquired.  A micropuncture needle was used to access the right common femoral artery under ultrasound guidance.  An 018 wire was advanced without resistance and a micropuncture sheath was placed.  The 018 wire was removed and a benson wire was placed.  The micropuncture sheath was exchanged for a 5 french sheath.  An omniflush catheter was advanced over the wire to the level of L-1.  An abdominal angiogram was obtained.  Next, using the omniflush catheter and a benson wire, the aortic bifurcation was crossed and the catheter was placed into theleft external iliac artery and left runoff was obtained.  right runoff was performed via retrograde sheath injections. ? ?Patient with severe peripheral arterial disease bilaterally.  This was not amenable to endovascular intervention.  The left side will require  common femoral endarterectomy.  The small size of the posterior tibial artery is not amenable to bypass surgery. She does not have wounds present. At her age, if the pain is tolerable, we could continue outpt follow up.  If not, I was discuss left-sided common femoral endarterectomy. The left leg will take priority.  ? ?J. Gillis Santa, MD ?[facility] ?Office: [phone] ? ? ? ?

## 2021-07-11 NOTE — Progress Notes (Signed)
Pt ambulated without difficulty or bleeding.   Discharged home with daughter who will drive and stay with pt x 24 hrs 

## 2021-07-11 NOTE — Discharge Instructions (Signed)
Femoral Site Care This sheet gives you information about how to care for yourself after your procedure. Your health care provider may also give you more specific instructions. If you have problems or questions, contact your health care provider. What can I expect after the procedure? After the procedure, it is common to have: Bruising that usually fades within 1-2 weeks. Tenderness at the site. Follow these instructions at home: Wound care May remove bandage after 24 hours. Do not take baths, swim, or use a hot tub for 5 days. You may shower 24-48 hours after the procedure. Gently wash the site with plain soap and water. Pat the area dry with a clean towel. Do not rub the site. This may cause bleeding. Do not apply powder or lotion to the site. Keep the site clean and dry. Check your femoral site every day for signs of infection. Check for: Redness, swelling, or pain. Fluid or blood. Warmth. Pus or a bad smell. Activity For the first 2-3 days after your procedure, or as long as directed: Avoid climbing stairs as much as possible. Do not squat. Do not lift, push or pull anything that is heavier than 10 lb for 5 days. Rest as directed. Avoid sitting for a long time without moving. Get up to take short walks every 1-2 hours. Do not drive for 24 hours. General instructions Take over-the-counter and prescription medicines only as told by your health care provider. Keep all follow-up visits as told by your health care provider. This is important. DRINK PLENTY OF FLUIDS FOR THE NEXT 2-3 DAYS. Contact a health care provider if you have: A fever or chills. You have redness, swelling, or pain around your insertion site. Get help right away if: The catheter insertion area swells very fast. You pass out. You suddenly start to sweat or your skin gets clammy. The catheter insertion area is bleeding, and the bleeding does not stop when you hold steady pressure on the area. The area near or  just beyond the catheter insertion site becomes pale, cool, tingly, or numb. These symptoms may represent a serious problem that is an emergency. Do not wait to see if the symptoms will go away. Get medical help right away. Call your local emergency services (911 in the [place] Do not drive yourself to the hospital. Summary After the procedure, it is common to have bruising that usually fades within 1-2 weeks. Check your femoral site every day for signs of infection. Do not lift, push or pull anything that is heavier than 10 lb for 5 days.  This information is not intended to replace advice given to you by your health care provider. Make sure you discuss any questions you have with your health care provider. Document Revised: 04/14/2017 Document Reviewed: 04/14/2017 Elsevier Patient Education  2020 [employer]  

## 2021-07-11 NOTE — H&P (Signed)
?Office Note  ? ?Patient seen and examined in preop holding.  No complaints. ?No changes to medication history or physical exam since last seen in clinic. ?She continues to ambulate, however not much. Palpable, right DP pulse, nonpalpable pulses in the right leg. The pain is appreciated at rest when sitting, but does not wake her up at night. No wounds. Pt with known lumber spine compression fractures. [name]'s pain in her feet is likely multifactorial.  ?Judging by ABIs and symptoms, there is absolutely an element of arterial insufficiency. Left leg PAD is defined as Rutherford 4 critical limb ischemia.  ?After discussing the risks and benefits of bilateral lower extremity angiogram with emphasis on the left, Chelbi elected to proceed ? ?Broadus John MD ? ? ?CC:  follow up ?Requesting Provider:  No ref. provider found ? ?HPI: [name] is a 88 y.o. ([birth year]) female who presents for follow up of PAD. She has remote history of left above knee to below knee popliteal artery bypass graft on 07/23/10. Her bypass unfortunately occluded shortly after its placement. Her ABI's have been non compressible with dampened TBI's. Her symptoms thus far have been not lifestyle limiting. She explains that time of her last visit she was not having pain on that particular day but that the pain was intermittent. Now she says her pain is daily. Describes it as " just a pain". It is equal in both legs. She says she has not paid it much attention as far as if one hurts more than the other. The pain is in both the shin and calf of her legs. This occurs at rest. It is not worsened by ambulation. It does wake her up at night. She gets some relief with Diclofenac gel and also transdermal pain patches. She denies any pain in her thighs or feet. No non healing wounds.  ? ?The pt is on a statin for cholesterol management. She also takes Zetia ?The pt is not on a daily aspirin.   Other AC:  Eliquis for atrial fibrillation ?The pt is  on ACE, CCB and BB for hypertension.   ?The pt is diabetic.   ?Tobacco hx:  never ? ?Past Medical History:  ?Diagnosis Date  ? Atrial fibrillation ([place])   ? Diabetes mellitus   ? DVT (deep venous thrombosis) ([place])   ? Hyperlipidemia   ? Hypertension   ? Leg pain   ? Peripheral arterial disease ([place])   ? Stroke [facility]) 1999  ? ? ?Past Surgical History:  ?Procedure Laterality Date  ? [place]  ? CARDIOVERSION N/A 02/15/2020  ? Procedure: CARDIOVERSION;  Surgeon: Thayer Headings, MD;  Location: [place];  Service: Cardiovascular;  Laterality: N/A;  ? CATARACT EXTRACTION  2000  ? CESAREAN SECTION    ? EYE SURGERY    ? FEMORAL BYPASS Left July 23, 2010  ? LUMBAR DISC SURGERY    ? SPINE SURGERY    ? ? ?Social History  ? ?Socioeconomic History  ? Marital status: Widowed  ?  Spouse name: Not on file  ? Number of children: 1  ? Years of education: Not on file  ? Highest education level: Not on file  ?Occupational History  ? Occupation: Retired  ?  Comment: retail Financial controller  ?Tobacco Use  ? Smoking status: Never  ? Smokeless tobacco: Never  ?Vaping Use  ? Vaping Use: Never used  ?Substance and Sexual Activity  ? Alcohol use: No  ? Drug  use: No  ? Sexual activity: Not on file  ?Other Topics Concern  ? Not on file  ?Social History Narrative  ? Lives alone. Has daily contact with friends. Transportation by friends and sometimes [employer].  ? ?Social Determinants of Health  ? ?Financial Resource Strain: Not on file  ?Food Insecurity: No Food Insecurity  ? Worried About Charity fundraiser in the Last Year: Never true  ? Ran Out of Food in the Last Year: Never true  ?Transportation Needs: No Transportation Needs  ? Lack of Transportation (Medical): No  ? Lack of Transportation (Non-Medical): No  ?Physical Activity: Not on file  ?Stress: Not on file  ?Social Connections: Not on file  ?Intimate Partner Violence: Not on file  ? ? ?Family History  ?Problem Relation Age of Onset  ? Hypertension  Mother   ? Heart attack Mother   ? Diabetes Father   ? Hypertension Father   ? Stroke Father   ? Stroke Sister   ? Diabetes Sister   ? Heart disease Sister   ?     Before age 74-Aneurysm  ? Dementia Sister   ? Alzheimer's disease Sister   ? Diabetes Brother   ? Diabetes Brother   ? Heart disease Brother   ?     Before age 7  ? Heart attack Brother   ? Heart attack Sister   ? Diabetes Sister   ? Alzheimer's disease Sister   ? ? ?Current Facility-Administered Medications  ?Medication Dose Route Frequency Provider Last Rate Last Admin  ? 0.9 %  sodium chloride infusion   Intravenous Continuous Broadus John, MD 100 mL/hr at 07/11/21 0715 New Bag at 07/11/21 0715  ? ? ?No Known Allergies ? ? ?REVIEW OF SYSTEMS:  ? ?[X]  denotes positive finding, [ ]  denotes negative finding ?Cardiac  Comments:  ?Chest pain or chest pressure:    ?Shortness of breath upon exertion:    ?Short of breath when lying flat:    ?Irregular heart rhythm:    ?    ?Vascular    ?Pain in calf, thigh, or hip brought on by ambulation:    ?Pain in feet at night that wakes you up from your sleep:     ?Blood clot in your veins:    ?Leg swelling:     ?    ?Pulmonary    ?Oxygen at home:    ?Productive cough:     ?Wheezing:     ?    ?Neurologic    ?Sudden weakness in arms or legs:     ?Sudden numbness in arms or legs:     ?Sudden onset of difficulty speaking or slurred speech:    ?Temporary loss of vision in one eye:     ?Problems with dizziness:     ?    ?Gastrointestinal    ?Blood in stool:     ?Vomited blood:     ?    ?Genitourinary    ?Burning when urinating:     ?Blood in urine:    ?    ?Psychiatric    ?Major depression:     ?    ?Hematologic    ?Bleeding problems:    ?Problems with blood clotting too easily:    ?    ?Skin    ?Rashes or ulcers:    ?    ?Constitutional    ?Fever or chills:    ? ? ?PHYSICAL EXAMINATION: ? ?Vitals:  ? 07/11/21 0655  ?BP: Marland Kitchen)  189/83  ?Pulse: 66  ?Resp: 17  ?Temp: 97.8 ?F (36.6 ?C)  ?TempSrc: Oral  ?SpO2: 100%  ?Weight:  37.2 kg  ?Height: 5\' 2"  (1.575 m)  ? ? ?General:  WDWN in NAD; vital signs documented above ?Gait: Normal, ambulates with cane ?HENT: WNL, normocephalic ?Pulmonary: normal non-labored breathing , without wheezing ?Cardiac: irregular HR ?Abdomen: soft, NT, no masses ?Vascular Exam/Pulses: ? Right Left  ?Radial 2+ (normal) 2+ (normal)  ?Femoral 2+ (normal) 2+ (normal)  ?Popliteal Not palpable Not palpable  ?DP absent absent  ?PT absent absent  ? ?Extremities: without ischemic changes, without Gangrene , without cellulitis; without open wounds;  ?Musculoskeletal: no muscle wasting or atrophy  ?Neurologic: A&O X 3;  No focal weakness or paresthesias are detected ?Psychiatric:  The pt has Normal affect. ? ? ?Non-Invasive Vascular Imaging:   ?+-------+-----------+-----------+------------+------------+  ?ABI/TBIToday's ABIToday's TBIPrevious ABIPrevious TBI  ?+-------+-----------+-----------+------------+------------+  ?Right  [place]         0.40            [place]             0.70        ?+-------+-----------+-----------+------------+------------+  ?Left   [place]         0.17                       0.33          ?+-------+-----------+-----------+------------+------------+  ?Right great toe pressure 36 mm Hg ?Left great toe pressure 84 mmHg ? ? ?ASSESSMENT/PLAN:: 88 y.o. female here for follow up for PAD. She has history of failed LLE Ak to BK popliteal bypass. Her symptoms have been non disabling. Her symptoms are now worse with rest pain in both legs. Her ABIs today remain non compressible with significant decrease in TBIs bilaterally. Toe pressures have also significantly decreased from time of her last visit. I have therefore recommended Aortogram, Arteriogram of BLE with possible LLE intervention. She is previously a patient of Dr. Oneida Alar. Will arrange this at next earliest availability with one of the physicians. She is on Eliquis which will need to be held 3 days ? ? ?Broadus John, PA-C ?[facility] ?[phone] ? ?Clinic MD:   Dr. Scot Dock  ?

## 2021-07-12 ENCOUNTER — Encounter (HOSPITAL_COMMUNITY): Payer: Self-pay | Admitting: Vascular Surgery

## 2021-07-18 ENCOUNTER — Other Ambulatory Visit: Payer: Self-pay

## 2021-07-18 DIAGNOSIS — E1151 Type 2 diabetes mellitus with diabetic peripheral angiopathy without gangrene: Secondary | ICD-10-CM | POA: Diagnosis not present

## 2021-07-18 DIAGNOSIS — L84 Corns and callosities: Secondary | ICD-10-CM | POA: Diagnosis not present

## 2021-07-18 DIAGNOSIS — I70222 Atherosclerosis of native arteries of extremities with rest pain, left leg: Secondary | ICD-10-CM

## 2021-07-18 DIAGNOSIS — I739 Peripheral vascular disease, unspecified: Secondary | ICD-10-CM | POA: Diagnosis not present

## 2021-07-18 DIAGNOSIS — L603 Nail dystrophy: Secondary | ICD-10-CM | POA: Diagnosis not present

## 2021-07-23 NOTE — Progress Notes (Signed)
Surgical Instructions ? ? ? Your procedure is scheduled on Thursday, April 13th, 2023. ? ? Report to [facility] Main Entrance "A" at 05:30 A.M., then check in with the Admitting office. ? Call this number if you have problems the morning of surgery: ? [phone] ? ? If you have any questions prior to your surgery date call [phone]: Open Monday-Friday 8am-4pm ? ? ? Remember: ? Do not eat or drink after midnight the night before your surgery ?  ? Take these medicines the morning of surgery with A SIP OF WATER:  ? ?atorvastatin (LIPITOR) ?diltiazem (CARDIZEM CD)  ?ezetimibe (ZETIA) ?latanoprost (XALATAN) ?metoprolol tartrate (LOPRESSOR)  ? ?If needed: ? ?acetaminophen (TYLENOL) ? ?Follow your surgeon's instructions on when to stop Eliquis.  If no instructions were given by your surgeon then you will need to call the office to get those instructions.    ? ?As of today, STOP taking any Aspirin (unless otherwise instructed by your surgeon) Aleve, Naproxen, Ibuprofen, Motrin, Advil, Goody's, BC's, all herbal medications, fish oil, and all vitamins. ? ? ?WHAT DO I DO ABOUT MY DIABETES MEDICATION? ? ? ?Do not take glipiZIDE (GLUCOTROL) the night before surgery and the morning of surgery ? ?Do not take metFORMIN (GLUCOPHAGE) the morning of surgery ? ?THE NIGHT BEFORE SURGERY, take 5 units of LANTUS SOLOSTAR insulin.     ? ? ?If your CBG is greater than 220 mg/dL, you may take ? of your sliding scale (correction) dose of insulin. ? ? ?HOW TO MANAGE YOUR DIABETES ?BEFORE AND AFTER SURGERY ? ?Why is it important to control my blood sugar before and after surgery? ?Improving blood sugar levels before and after surgery helps healing and can limit problems. ?A way of improving blood sugar control is eating a healthy diet by: ? Eating less sugar and carbohydrates ? Increasing activity/exercise ? Talking with your doctor about reaching your blood sugar goals ?High blood sugars (greater than 180 mg/dL) can raise your risk of  infections and slow your recovery, so you will need to focus on controlling your diabetes during the weeks before surgery. ?Make sure that the doctor who takes care of your diabetes knows about your planned surgery including the date and location. ? ?How do I manage my blood sugar before surgery? ?Check your blood sugar at least 4 times a day, starting 2 days before surgery, to make sure that the level is not too high or low. ? ?Check your blood sugar the morning of your surgery when you wake up and every 2 hours until you get to the Short Stay unit. ? ?If your blood sugar is less than 70 mg/dL, you will need to treat for low blood sugar: ?Do not take insulin. ?Treat a low blood sugar (less than 70 mg/dL) with ? cup of clear juice (cranberry or apple), 4 glucose tablets, OR glucose gel. ?Recheck blood sugar in 15 minutes after treatment (to make sure it is greater than 70 mg/dL). If your blood sugar is not greater than 70 mg/dL on recheck, call [phone] for further instructions. ?Report your blood sugar to the short stay nurse when you get to Short Stay. ? ?If you are admitted to the hospital after surgery: ?Your blood sugar will be checked by the staff and you will probably be given insulin after surgery (instead of oral diabetes medicines) to make sure you have good blood sugar levels. ?The goal for blood sugar control after surgery is 80-180 mg/dL.  ? ? ? The day of  surgery: ?         ?Do not wear jewelry or makeup ?Do not wear lotions, powders, perfumes, or deodorant. ?Do not shave 48 hours prior to surgery.   ?Do not bring valuables to the hospital. ?Do not wear nail polish, gel polish, artificial nails, or any other type of covering on natural nails (fingers and toes) ?If you have artificial nails or gel coating that need to be removed by a nail salon, please have this removed prior to surgery. Artificial nails or gel coating may interfere with anesthesia's ability to adequately monitor your vital  signs. ? ? ?[facility] is not responsible for any belongings or valuables. .  ? ?Do NOT Smoke (Tobacco/Vaping)  24 hours prior to your procedure ? ?If you use a CPAP at night, you may bring your mask for your overnight stay. ?  ?Contacts, glasses, hearing aids, dentures or partials may not be worn into surgery, please bring cases for these belongings ?  ?For patients admitted to the hospital, discharge time will be determined by your treatment team. ?  ?Patients discharged the day of surgery will not be allowed to drive home, and someone needs to stay with them for 24 hours. ? ? ?SURGICAL WAITING ROOM VISITATION ?Patients having surgery or a procedure in a hospital may have two support people. ?Children under the age of 49 must have an adult with them who is not the patient. ?They may stay in the waiting area during the procedure and may switch out with other visitors. If the patient needs to stay at the hospital during part of their recovery, the visitor guidelines for inpatient rooms apply. ? ?Please refer to the [facility] website for the visitor guidelines for Inpatients (after your surgery is over and you are in a regular room).  ? ? ?Special instructions:   ? ?Oral Hygiene is also important to reduce your risk of infection.  Remember - BRUSH YOUR TEETH THE MORNING OF SURGERY WITH YOUR REGULAR TOOTHPASTE ? ? ?[facility]- Preparing For Surgery ? ?Before surgery, you can play an important role. Because skin is not sterile, your skin needs to be as free of germs as possible. You can reduce the number of germs on your skin by washing with CHG (chlorahexidine gluconate) Soap before surgery.  CHG is an antiseptic cleaner which kills germs and bonds with the skin to continue killing germs even after washing.   ? ? ?Please do not use if you have an allergy to CHG or antibacterial soaps. If your skin becomes reddened/irritated stop using the CHG.  ?Do not shave (including legs and underarms) for at least 48 hours  prior to first CHG shower. It is OK to shave your face. ? ?Please follow these instructions carefully. ?  ? ? Shower the NIGHT BEFORE SURGERY and the MORNING OF SURGERY with CHG Soap.  ? If you chose to wash your hair, wash your hair first as usual with your normal shampoo. After you shampoo, rinse your hair and body thoroughly to remove the shampoo.  Then [employer] and genitals (private parts) with your normal soap and rinse thoroughly to remove soap. ? ?After that Use CHG Soap as you would any other liquid soap. You can apply CHG directly to the skin and wash gently with a scrungie or a clean washcloth.  ? ?Apply the CHG Soap to your body ONLY FROM THE NECK DOWN.  Do not use on open wounds or open sores. Avoid contact with your eyes,  ears, mouth and genitals (private parts). Wash Face and genitals (private parts)  with your normal soap.  ? ?Wash thoroughly, paying special attention to the area where your surgery will be performed. ? ?Thoroughly rinse your body with warm water from the neck down. ? ?DO NOT shower/wash with your normal soap after using and rinsing off the CHG Soap. ? ?Pat yourself dry with a CLEAN TOWEL. ? ?Wear CLEAN PAJAMAS to bed the night before surgery ? ?Place CLEAN SHEETS on your bed the night before your surgery ? ?DO NOT SLEEP WITH PETS. ? ? ?Day of Surgery: ? ?Take a shower with CHG soap. ?Wear Clean/Comfortable clothing the morning of surgery ?Do not apply any deodorants/lotions.   ?Remember to brush your teeth WITH YOUR REGULAR TOOTHPASTE. ? ? ? ?If you received a COVID test during your pre-op visit  it is requested that you wear a mask when out in public, stay away from anyone that may not be feeling well and notify your surgeon if you develop symptoms. If you have been in contact with anyone that has tested positive in the last 10 days please notify you surgeon. ? ?  ?Please read over the following fact sheets that you were given.   ?

## 2021-07-24 ENCOUNTER — Other Ambulatory Visit: Payer: Self-pay

## 2021-07-24 ENCOUNTER — Encounter (HOSPITAL_COMMUNITY)
Admission: RE | Admit: 2021-07-24 | Discharge: 2021-07-24 | Disposition: A | Discharge status: Home / Self Care | Payer: Medicare HMO | Source: Ambulatory Visit | Attending: Vascular Surgery | Admitting: Vascular Surgery

## 2021-07-24 ENCOUNTER — Encounter (HOSPITAL_COMMUNITY): Payer: Self-pay

## 2021-07-24 VITALS — BP 186/81 | HR 75 | Temp 97.6°F | Resp 16 | Ht 59.0 in | Wt 84.1 lb

## 2021-07-24 DIAGNOSIS — M62262 Nontraumatic ischemic infarction of muscle, left lower leg: Secondary | ICD-10-CM | POA: Diagnosis not present

## 2021-07-24 DIAGNOSIS — R278 Other lack of coordination: Secondary | ICD-10-CM | POA: Diagnosis not present

## 2021-07-24 DIAGNOSIS — I4892 Unspecified atrial flutter: Secondary | ICD-10-CM | POA: Diagnosis present

## 2021-07-24 DIAGNOSIS — E1151 Type 2 diabetes mellitus with diabetic peripheral angiopathy without gangrene: Secondary | ICD-10-CM | POA: Diagnosis present

## 2021-07-24 DIAGNOSIS — E785 Hyperlipidemia, unspecified: Secondary | ICD-10-CM | POA: Diagnosis present

## 2021-07-24 DIAGNOSIS — Z7401 Bed confinement status: Secondary | ICD-10-CM | POA: Diagnosis not present

## 2021-07-24 DIAGNOSIS — Z820 Family history of epilepsy and other diseases of the nervous system: Secondary | ICD-10-CM | POA: Diagnosis not present

## 2021-07-24 DIAGNOSIS — I4819 Other persistent atrial fibrillation: Secondary | ICD-10-CM | POA: Diagnosis not present

## 2021-07-24 DIAGNOSIS — I998 Other disorder of circulatory system: Secondary | ICD-10-CM | POA: Diagnosis not present

## 2021-07-24 DIAGNOSIS — R29818 Other symptoms and signs involving the nervous system: Secondary | ICD-10-CM | POA: Diagnosis not present

## 2021-07-24 DIAGNOSIS — I70222 Atherosclerosis of native arteries of extremities with rest pain, left leg: Secondary | ICD-10-CM | POA: Insufficient documentation

## 2021-07-24 DIAGNOSIS — I6782 Cerebral ischemia: Secondary | ICD-10-CM | POA: Diagnosis not present

## 2021-07-24 DIAGNOSIS — I739 Peripheral vascular disease, unspecified: Secondary | ICD-10-CM | POA: Diagnosis not present

## 2021-07-24 DIAGNOSIS — I6381 Other cerebral infarction due to occlusion or stenosis of small artery: Secondary | ICD-10-CM | POA: Diagnosis not present

## 2021-07-24 DIAGNOSIS — Z01812 Encounter for preprocedural laboratory examination: Secondary | ICD-10-CM | POA: Insufficient documentation

## 2021-07-24 DIAGNOSIS — E1165 Type 2 diabetes mellitus with hyperglycemia: Secondary | ICD-10-CM | POA: Diagnosis not present

## 2021-07-24 DIAGNOSIS — Z7984 Long term (current) use of oral hypoglycemic drugs: Secondary | ICD-10-CM | POA: Diagnosis not present

## 2021-07-24 DIAGNOSIS — E119 Type 2 diabetes mellitus without complications: Secondary | ICD-10-CM | POA: Insufficient documentation

## 2021-07-24 DIAGNOSIS — R54 Age-related physical debility: Secondary | ICD-10-CM | POA: Diagnosis present

## 2021-07-24 DIAGNOSIS — Z8673 Personal history of transient ischemic attack (TIA), and cerebral infarction without residual deficits: Secondary | ICD-10-CM | POA: Insufficient documentation

## 2021-07-24 DIAGNOSIS — R2681 Unsteadiness on feet: Secondary | ICD-10-CM | POA: Diagnosis not present

## 2021-07-24 DIAGNOSIS — Z7901 Long term (current) use of anticoagulants: Secondary | ICD-10-CM | POA: Insufficient documentation

## 2021-07-24 DIAGNOSIS — Z01818 Encounter for other preprocedural examination: Secondary | ICD-10-CM

## 2021-07-24 DIAGNOSIS — F0150 Vascular dementia without behavioral disturbance: Secondary | ICD-10-CM | POA: Diagnosis present

## 2021-07-24 DIAGNOSIS — I441 Atrioventricular block, second degree: Secondary | ICD-10-CM | POA: Diagnosis present

## 2021-07-24 DIAGNOSIS — R0682 Tachypnea, not elsewhere classified: Secondary | ICD-10-CM | POA: Diagnosis not present

## 2021-07-24 DIAGNOSIS — I071 Rheumatic tricuspid insufficiency: Secondary | ICD-10-CM | POA: Diagnosis not present

## 2021-07-24 DIAGNOSIS — R293 Abnormal posture: Secondary | ICD-10-CM | POA: Diagnosis not present

## 2021-07-24 DIAGNOSIS — I480 Paroxysmal atrial fibrillation: Secondary | ICD-10-CM | POA: Diagnosis present

## 2021-07-24 DIAGNOSIS — E43 Unspecified severe protein-calorie malnutrition: Secondary | ICD-10-CM | POA: Diagnosis present

## 2021-07-24 DIAGNOSIS — Z79899 Other long term (current) drug therapy: Secondary | ICD-10-CM | POA: Diagnosis not present

## 2021-07-24 DIAGNOSIS — R262 Difficulty in walking, not elsewhere classified: Secondary | ICD-10-CM | POA: Diagnosis not present

## 2021-07-24 DIAGNOSIS — I10 Essential (primary) hypertension: Secondary | ICD-10-CM | POA: Diagnosis not present

## 2021-07-24 DIAGNOSIS — I63233 Cerebral infarction due to unspecified occlusion or stenosis of bilateral carotid arteries: Secondary | ICD-10-CM | POA: Diagnosis not present

## 2021-07-24 DIAGNOSIS — G919 Hydrocephalus, unspecified: Secondary | ICD-10-CM | POA: Diagnosis not present

## 2021-07-24 DIAGNOSIS — Z86718 Personal history of other venous thrombosis and embolism: Secondary | ICD-10-CM | POA: Diagnosis not present

## 2021-07-24 DIAGNOSIS — Z794 Long term (current) use of insulin: Secondary | ICD-10-CM | POA: Diagnosis not present

## 2021-07-24 DIAGNOSIS — F05 Delirium due to known physiological condition: Secondary | ICD-10-CM | POA: Diagnosis present

## 2021-07-24 DIAGNOSIS — E1169 Type 2 diabetes mellitus with other specified complication: Secondary | ICD-10-CM | POA: Diagnosis present

## 2021-07-24 DIAGNOSIS — I4891 Unspecified atrial fibrillation: Secondary | ICD-10-CM | POA: Insufficient documentation

## 2021-07-24 DIAGNOSIS — Z681 Body mass index (BMI) 19 or less, adult: Secondary | ICD-10-CM | POA: Diagnosis not present

## 2021-07-24 DIAGNOSIS — D649 Anemia, unspecified: Secondary | ICD-10-CM | POA: Diagnosis not present

## 2021-07-24 DIAGNOSIS — Z833 Family history of diabetes mellitus: Secondary | ICD-10-CM | POA: Diagnosis not present

## 2021-07-24 DIAGNOSIS — Z8249 Family history of ischemic heart disease and other diseases of the circulatory system: Secondary | ICD-10-CM | POA: Diagnosis not present

## 2021-07-24 DIAGNOSIS — R531 Weakness: Secondary | ICD-10-CM | POA: Diagnosis not present

## 2021-07-24 HISTORY — DX: Unspecified osteoarthritis, unspecified site: M19.90

## 2021-07-24 LAB — COMPREHENSIVE METABOLIC PANEL
ALT: 45 U/L — ABNORMAL HIGH (ref 0–44)
AST: 39 U/L (ref 15–41)
Albumin: 4.6 g/dL (ref 3.5–5.0)
Alkaline Phosphatase: 65 U/L (ref 38–126)
Anion gap: 14 (ref 5–15)
BUN: 13 mg/dL (ref 8–23)
CO2: 24 mmol/L (ref 22–32)
Calcium: 9.9 mg/dL (ref 8.9–10.3)
Chloride: 101 mmol/L (ref 98–111)
Creatinine, Ser: 0.84 mg/dL (ref 0.44–1.00)
GFR, Estimated: >60 mL/min (ref >60)
Glucose, Bld: 226 mg/dL — ABNORMAL HIGH (ref 70–99)
Potassium: 4.1 mmol/L (ref 3.5–5.1)
Sodium: 139 mmol/L (ref 135–145)
Total Bilirubin: 0.7 mg/dL (ref 0.3–1.2)
Total Protein: 7.7 g/dL (ref 6.5–8.1)

## 2021-07-24 LAB — HEMOGLOBIN A1C
Hgb A1c MFr Bld: 7.3 % — ABNORMAL HIGH (ref 4.8–5.6)
Mean Plasma Glucose: 162.81 mg/dL

## 2021-07-24 LAB — CBC
HCT: 40.2 % (ref 36.0–46.0)
Hemoglobin: 13.3 g/dL (ref 12.0–15.0)
MCH: 35.3 pg — ABNORMAL HIGH (ref 26.0–34.0)
MCHC: 33.1 g/dL (ref 30.0–36.0)
MCV: 106.6 fL — ABNORMAL HIGH (ref 80.0–100.0)
Platelets: 219 10*3/uL (ref 150–400)
RBC: 3.77 MIL/uL — ABNORMAL LOW (ref 3.87–5.11)
RDW: 14.6 % (ref 11.5–15.5)
WBC: 6.4 10*3/uL (ref 4.0–10.5)
nRBC: 0 % (ref 0.0–0.2)

## 2021-07-24 LAB — URINALYSIS, ROUTINE W REFLEX MICROSCOPIC
Bilirubin Urine: NEGATIVE
Glucose, UA: 150 mg/dL — AB
Hgb urine dipstick: NEGATIVE
Ketones, ur: NEGATIVE mg/dL
Nitrite: NEGATIVE
Protein, ur: 30 mg/dL — AB
Specific Gravity, Urine: 1.006 (ref 1.005–1.030)
pH: 5 (ref 5.0–8.0)

## 2021-07-24 LAB — GLUCOSE, CAPILLARY: Glucose-Capillary: 220 mg/dL — ABNORMAL HIGH (ref 70–99)

## 2021-07-24 LAB — SURGICAL PCR SCREEN
MRSA, PCR: NEGATIVE
Staphylococcus aureus: NEGATIVE

## 2021-07-24 LAB — TYPE AND SCREEN
ABO/RH(D): O POS
Antibody Screen: NEGATIVE

## 2021-07-24 LAB — PROTIME-INR
INR: 1 (ref 0.8–1.2)
Prothrombin Time: 12.9 seconds (ref 11.4–15.2)

## 2021-07-24 LAB — APTT: aPTT: 30 seconds (ref 24–36)

## 2021-07-24 NOTE — Progress Notes (Signed)
Inboxed MD regarding positive UA.  ?

## 2021-07-24 NOTE — Progress Notes (Signed)
PCP - [name] ?Cardiologist - [place] (last visit 05/16/21) ? ?Chest x-ray - n/a ?EKG - 05/16/21 ?ECHO - 02/14/20 ? ?DM - Type 2 ?Fasting Blood Sugar - 70 - low 100's ? ? ?Blood Thinner Instructions: stopped on 07/21/21, per patient's son Kendell Bane) ? ? ? ?Anesthesia review: yes, heart history ? ?Patient denies shortness of breath, fever, cough and chest pain at PAT appointment ? ? ?All instructions explained to the patient, with a verbal understanding of the material. Patient agrees to go over the instructions while at home for a better understanding. Patient also instructed to self quarantine after being tested for COVID-19. The opportunity to ask questions was provided. ? ? ?

## 2021-07-25 NOTE — Progress Notes (Signed)
Anesthesia Chart Review: ? ?Follows with cardiology for history of atrial fibrillation/flutter with prior CVA, s/p cardioversion November 2021.  She is maintained on Eliquis.  Echo November 2021 showed EF 50 to 55%, mild RV dysfunction.  Last seen by Dr. Jens Som 05/16/2021 and noted to be maintaining sinus rhythm at that time no changes made to management. ? ?Patient reported last dose Eliquis 07/21/2021. ? ?Follows with vascular surgery for history of severe peripheral arterial disease bilateral lower extremities. ? ?IDDM 2, A1c 7.3 on 07/24/2021. ? ?Preop labs reviewed, unremarkable. ? ?EKG 05/16/2021 (read per Dr. Ludwig Clarks note same date): normal sinus rhythm with PACs, normal axis, left ventricular hypertrophy, probable precordial lead reversal. ? ?TTE 02/14/2020: ? 1. Left ventricular ejection fraction, by estimation, is 50 to 55%. The  ?left ventricle has low normal function. The left ventricle has no regional  ?wall motion abnormalities. Indeterminate diastolic filling due to E-A  ?fusion.  ? 2. Right ventricular systolic function is mildly reduced. The right  ?ventricular size is normal. There is normal pulmonary artery systolic  ?pressure.  ? 3. The mitral valve is normal in structure. Trivial mitral valve  ?regurgitation.  ? 4. The aortic valve was not well visualized. Aortic valve regurgitation  ?is not visualized.  ? ? ? ?Antionette Poles, PA-C ?[facility] Short Stay [facility]/Anesthesiology ?Phone [phone] ?07/25/2021 9:37 AM ? ?

## 2021-07-25 NOTE — Anesthesia Preprocedure Evaluation (Addendum)
Anesthesia Evaluation  ?Patient identified by MRN, date of birth, ID band ?Patient awake ? ? ? ?Reviewed: ?Allergy & Precautions, NPO status , Patient's Chart, lab work & pertinent test results ? ?Airway ?Mallampati: II ? ?TM Distance: >3 FB ?Neck ROM: Full ? ? ? Dental ? ?(+) Missing,  ?  ?Pulmonary ?neg pulmonary ROS,  ?  ?Pulmonary exam normal ?breath sounds clear to auscultation ? ? ? ? ? ? Cardiovascular ?hypertension, Pt. on medications and Pt. on home beta blockers ?+ Peripheral Vascular Disease and + DVT  ?Normal cardiovascular exam+ dysrhythmias Atrial Fibrillation  ?Rhythm:Regular Rate:Normal ? ?On eliquis - last dose 07/22/21 ?H/o afib. Currently NSR on 07/26/21 ?  ?Neuro/Psych ?CVA negative neurological ROS ? negative psych ROS  ? GI/Hepatic ?negative GI ROS, Neg liver ROS,   ?Endo/Other  ?negative endocrine ROSdiabetes, Poorly Controlled, Type 2, Oral Hypoglycemic Agents, Insulin DependentHyperlipidemia ? Renal/GU ?negative Renal ROS  ?negative genitourinary ?  ?Musculoskeletal ?negative musculoskeletal ROS ?(+) Arthritis , Osteoarthritis,   ? Abdominal ?  ?Peds ?negative pediatric ROS ?(+)  Hematology ?negative hematology ROS ?(+)   ?Anesthesia Other Findings ? ? Reproductive/Obstetrics ?negative OB ROS ? ?  ? ? ? ? ? ? ? ? ? ? ? ? ? ?  ?  ? ? ? ? ? ? ?Anesthesia Physical ?Anesthesia Plan ? ?ASA: 4 ? ?Anesthesia Plan: General  ? ?Post-op Pain Management:   ? ?Induction: Intravenous ? ?PONV Risk Score and Plan: 3 and Treatment may vary due to age or medical condition ? ?Airway Management Planned: Oral ETT ? ?Additional Equipment: Arterial line ? ?Intra-op Plan:  ? ?Post-operative Plan: Extubation in OR ? ?Informed Consent: I have reviewed the patients History and Physical, chart, labs and discussed the procedure including the risks, benefits and alternatives for the proposed anesthesia with the patient or authorized representative who has indicated his/her understanding  and acceptance.  ? ? ? ?Dental advisory given ? ?Plan Discussed with: CRNA ? ?Anesthesia Plan Comments: (PAT note by Karoline Caldwell, PA-C: ? ?Follows with cardiology for history of atrial fibrillation/flutter with prior CVA, s/p cardioversion November 2021.  She is maintained on Eliquis.  Echo November 2021 showed EF 50 to 55%, mild RV dysfunction.  Last seen by Dr. Stanford Breed 05/16/2021 and noted to be maintaining sinus rhythm at that time no changes made to management. ? ?Patient reported last dose Eliquis 07/21/2021. ? ?Follows with vascular surgery for history of severe peripheral arterial disease bilateral lower extremities. ? ?IDDM 2, A1c 7.3 on 07/24/2021. ? ?Preop labs reviewed, unremarkable. ? ?EKG 05/16/2021 (read per Dr. Jacalyn Lefevre note same date): normal sinus rhythm with PACs, normal axis, left ventricular hypertrophy, probable precordial lead reversal. ? ?TTE 02/14/2020: ??1. Left ventricular ejection fraction, by estimation, is 50 to 55%. The  ?left ventricle has low normal function. The left ventricle has no regional  ?wall motion abnormalities. Indeterminate diastolic filling due to E-A  ?fusion.  ??2. Right ventricular systolic function is mildly reduced. The right  ?ventricular size is normal. There is normal pulmonary artery systolic  ?pressure.  ??3. The mitral valve is normal in structure. Trivial mitral valve  ?regurgitation.  ??4. The aortic valve was not well visualized. Aortic valve regurgitation  ?is not visualized.  ? ?)  ? ? ? ? ?Anesthesia Quick Evaluation ? ?

## 2021-07-26 ENCOUNTER — Inpatient Hospital Stay (HOSPITAL_COMMUNITY): Payer: Medicare HMO

## 2021-07-26 ENCOUNTER — Inpatient Hospital Stay (HOSPITAL_COMMUNITY)
Admission: RE | Admit: 2021-07-26 | Discharge: 2021-08-08 | DRG: 252 | Disposition: A | Discharge status: Skilled Nursing Facility | Payer: Medicare HMO | Attending: Vascular Surgery | Admitting: Vascular Surgery

## 2021-07-26 ENCOUNTER — Encounter (HOSPITAL_COMMUNITY): Payer: Self-pay | Admitting: Vascular Surgery

## 2021-07-26 ENCOUNTER — Encounter (HOSPITAL_COMMUNITY)
Admission: RE | Disposition: A | Discharge status: Skilled Nursing Facility | Payer: Self-pay | Source: Home / Self Care | Attending: Vascular Surgery

## 2021-07-26 ENCOUNTER — Inpatient Hospital Stay (HOSPITAL_COMMUNITY): Payer: Medicare HMO | Admitting: Physician Assistant

## 2021-07-26 ENCOUNTER — Other Ambulatory Visit: Payer: Self-pay

## 2021-07-26 ENCOUNTER — Inpatient Hospital Stay (HOSPITAL_COMMUNITY): Payer: Medicare HMO | Admitting: Certified Registered Nurse Anesthetist

## 2021-07-26 DIAGNOSIS — Z7984 Long term (current) use of oral hypoglycemic drugs: Secondary | ICD-10-CM

## 2021-07-26 DIAGNOSIS — Z794 Long term (current) use of insulin: Secondary | ICD-10-CM

## 2021-07-26 DIAGNOSIS — Z7901 Long term (current) use of anticoagulants: Secondary | ICD-10-CM | POA: Diagnosis not present

## 2021-07-26 DIAGNOSIS — E785 Hyperlipidemia, unspecified: Secondary | ICD-10-CM | POA: Diagnosis not present

## 2021-07-26 DIAGNOSIS — E1165 Type 2 diabetes mellitus with hyperglycemia: Secondary | ICD-10-CM | POA: Diagnosis not present

## 2021-07-26 DIAGNOSIS — I10 Essential (primary) hypertension: Secondary | ICD-10-CM | POA: Diagnosis present

## 2021-07-26 DIAGNOSIS — Z86718 Personal history of other venous thrombosis and embolism: Secondary | ICD-10-CM | POA: Diagnosis not present

## 2021-07-26 DIAGNOSIS — Z8673 Personal history of transient ischemic attack (TIA), and cerebral infarction without residual deficits: Secondary | ICD-10-CM | POA: Diagnosis not present

## 2021-07-26 DIAGNOSIS — Z8249 Family history of ischemic heart disease and other diseases of the circulatory system: Secondary | ICD-10-CM | POA: Diagnosis not present

## 2021-07-26 DIAGNOSIS — I4892 Unspecified atrial flutter: Secondary | ICD-10-CM | POA: Diagnosis not present

## 2021-07-26 DIAGNOSIS — E43 Unspecified severe protein-calorie malnutrition: Secondary | ICD-10-CM | POA: Diagnosis present

## 2021-07-26 DIAGNOSIS — I4819 Other persistent atrial fibrillation: Secondary | ICD-10-CM | POA: Diagnosis not present

## 2021-07-26 DIAGNOSIS — F05 Delirium due to known physiological condition: Secondary | ICD-10-CM | POA: Diagnosis present

## 2021-07-26 DIAGNOSIS — I6782 Cerebral ischemia: Secondary | ICD-10-CM | POA: Diagnosis not present

## 2021-07-26 DIAGNOSIS — D649 Anemia, unspecified: Secondary | ICD-10-CM | POA: Diagnosis not present

## 2021-07-26 DIAGNOSIS — I4891 Unspecified atrial fibrillation: Secondary | ICD-10-CM | POA: Diagnosis present

## 2021-07-26 DIAGNOSIS — E1151 Type 2 diabetes mellitus with diabetic peripheral angiopathy without gangrene: Secondary | ICD-10-CM | POA: Diagnosis present

## 2021-07-26 DIAGNOSIS — E1169 Type 2 diabetes mellitus with other specified complication: Secondary | ICD-10-CM | POA: Diagnosis not present

## 2021-07-26 DIAGNOSIS — Z681 Body mass index (BMI) 19 or less, adult: Secondary | ICD-10-CM | POA: Diagnosis not present

## 2021-07-26 DIAGNOSIS — I480 Paroxysmal atrial fibrillation: Secondary | ICD-10-CM | POA: Diagnosis present

## 2021-07-26 DIAGNOSIS — Z833 Family history of diabetes mellitus: Secondary | ICD-10-CM

## 2021-07-26 DIAGNOSIS — R54 Age-related physical debility: Secondary | ICD-10-CM | POA: Diagnosis present

## 2021-07-26 DIAGNOSIS — F0150 Vascular dementia without behavioral disturbance: Secondary | ICD-10-CM | POA: Diagnosis present

## 2021-07-26 DIAGNOSIS — I6381 Other cerebral infarction due to occlusion or stenosis of small artery: Secondary | ICD-10-CM | POA: Diagnosis not present

## 2021-07-26 DIAGNOSIS — Z820 Family history of epilepsy and other diseases of the nervous system: Secondary | ICD-10-CM | POA: Diagnosis not present

## 2021-07-26 DIAGNOSIS — I441 Atrioventricular block, second degree: Secondary | ICD-10-CM | POA: Diagnosis present

## 2021-07-26 DIAGNOSIS — Z79899 Other long term (current) drug therapy: Secondary | ICD-10-CM

## 2021-07-26 DIAGNOSIS — I63233 Cerebral infarction due to unspecified occlusion or stenosis of bilateral carotid arteries: Secondary | ICD-10-CM | POA: Diagnosis not present

## 2021-07-26 DIAGNOSIS — R0682 Tachypnea, not elsewhere classified: Secondary | ICD-10-CM | POA: Diagnosis not present

## 2021-07-26 DIAGNOSIS — I70222 Atherosclerosis of native arteries of extremities with rest pain, left leg: Secondary | ICD-10-CM

## 2021-07-26 DIAGNOSIS — I739 Peripheral vascular disease, unspecified: Principal | ICD-10-CM

## 2021-07-26 DIAGNOSIS — R29818 Other symptoms and signs involving the nervous system: Secondary | ICD-10-CM | POA: Diagnosis not present

## 2021-07-26 DIAGNOSIS — I071 Rheumatic tricuspid insufficiency: Secondary | ICD-10-CM | POA: Diagnosis not present

## 2021-07-26 DIAGNOSIS — G919 Hydrocephalus, unspecified: Secondary | ICD-10-CM | POA: Diagnosis not present

## 2021-07-26 DIAGNOSIS — I998 Other disorder of circulatory system: Secondary | ICD-10-CM | POA: Diagnosis not present

## 2021-07-26 HISTORY — PX: PATCH ANGIOPLASTY: SHX6230

## 2021-07-26 HISTORY — PX: ENDARTERECTOMY FEMORAL: SHX5804

## 2021-07-26 LAB — CBC
HCT: 35.4 % — ABNORMAL LOW (ref 36.0–46.0)
Hemoglobin: 11.9 g/dL — ABNORMAL LOW (ref 12.0–15.0)
MCH: 35.3 pg — ABNORMAL HIGH (ref 26.0–34.0)
MCHC: 33.6 g/dL (ref 30.0–36.0)
MCV: 105 fL — ABNORMAL HIGH (ref 80.0–100.0)
Platelets: 174 10*3/uL (ref 150–400)
RBC: 3.37 MIL/uL — ABNORMAL LOW (ref 3.87–5.11)
RDW: 14.2 % (ref 11.5–15.5)
WBC: 9.2 10*3/uL (ref 4.0–10.5)
nRBC: 0 % (ref 0.0–0.2)

## 2021-07-26 LAB — GLUCOSE, CAPILLARY
Glucose-Capillary: 150 mg/dL — ABNORMAL HIGH (ref 70–99)
Glucose-Capillary: 151 mg/dL — ABNORMAL HIGH (ref 70–99)
Glucose-Capillary: 185 mg/dL — ABNORMAL HIGH (ref 70–99)
Glucose-Capillary: 209 mg/dL — ABNORMAL HIGH (ref 70–99)
Glucose-Capillary: 228 mg/dL — ABNORMAL HIGH (ref 70–99)
Glucose-Capillary: 380 mg/dL — ABNORMAL HIGH (ref 70–99)
Glucose-Capillary: 250 mg/dL — ABNORMAL HIGH (ref 70–99)

## 2021-07-26 LAB — CREATININE, SERUM
Creatinine, Ser: 0.72 mg/dL (ref 0.44–1.00)
GFR, Estimated: >60 mL/min (ref >60)

## 2021-07-26 LAB — POCT ACTIVATED CLOTTING TIME: Activated Clotting Time: 275 seconds

## 2021-07-26 SURGERY — ENDARTERECTOMY, FEMORAL
Anesthesia: General | Site: Groin | Laterality: Left

## 2021-07-26 MED ORDER — PROTAMINE SULFATE 10 MG/ML IV SOLN
INTRAVENOUS | Status: AC
  Filled 2021-07-26: qty 5

## 2021-07-26 MED ORDER — ROCURONIUM BROMIDE 10 MG/ML (PF) SYRINGE
PREFILLED_SYRINGE | INTRAVENOUS | Status: AC
  Filled 2021-07-26: qty 10

## 2021-07-26 MED ORDER — GLIPIZIDE 10 MG PO TABS
10.0000 mg | ORAL_TABLET | Freq: Two times a day (BID) | ORAL | Status: DC
  Administered 2021-07-27 – 2021-07-31 (×9): 10 mg via ORAL
  Filled 2021-07-26 (×11): qty 1

## 2021-07-26 MED ORDER — MAGNESIUM SULFATE 2 GM/50ML IV SOLN: 2.0000 g | Freq: Every day | INTRAVENOUS | Status: DC | PRN

## 2021-07-26 MED ORDER — DILTIAZEM HCL ER COATED BEADS 120 MG PO CP24
240.0000 mg | ORAL_CAPSULE | Freq: Every day | ORAL | Status: DC
  Administered 2021-07-27 – 2021-07-28 (×2): 240 mg via ORAL
  Filled 2021-07-26 (×3): qty 2

## 2021-07-26 MED ORDER — ROCURONIUM BROMIDE 10 MG/ML (PF) SYRINGE
PREFILLED_SYRINGE | INTRAVENOUS | Status: DC | PRN
  Administered 2021-07-26: 70 mg via INTRAVENOUS
  Administered 2021-07-26 (×2): 10 mg via INTRAVENOUS

## 2021-07-26 MED ORDER — CHLORHEXIDINE GLUCONATE CLOTH 2 % EX PADS: 6.0000 | MEDICATED_PAD | Freq: Once | CUTANEOUS | Status: DC

## 2021-07-26 MED ORDER — SODIUM CHLORIDE 0.9 % IV SOLN
INTRAVENOUS | Status: DC
Start: 2021-07-26 — End: 2021-07-26

## 2021-07-26 MED ORDER — DEXAMETHASONE SODIUM PHOSPHATE 10 MG/ML IJ SOLN
INTRAMUSCULAR | Status: DC | PRN
  Administered 2021-07-26: 5 mg via INTRAVENOUS

## 2021-07-26 MED ORDER — EZETIMIBE 10 MG PO TABS
10.0000 mg | ORAL_TABLET | Freq: Every day | ORAL | Status: DC
  Administered 2021-07-26 – 2021-08-08 (×14): 10 mg via ORAL
  Filled 2021-07-26 (×14): qty 1

## 2021-07-26 MED ORDER — METOPROLOL TARTRATE 5 MG/5ML IV SOLN
2.0000 mg | INTRAVENOUS | Status: AC | PRN
  Administered 2021-07-28: 2.5 mg via INTRAVENOUS
  Administered 2021-07-28: 5 mg via INTRAVENOUS
  Filled 2021-07-26 (×2): qty 5

## 2021-07-26 MED ORDER — LIDOCAINE 2% (20 MG/ML) 5 ML SYRINGE
INTRAMUSCULAR | Status: DC | PRN
  Administered 2021-07-26: 60 mg via INTRAVENOUS

## 2021-07-26 MED ORDER — ONDANSETRON HCL 4 MG/2ML IJ SOLN: 4.0000 mg | Freq: Once | INTRAMUSCULAR | Status: DC | PRN

## 2021-07-26 MED ORDER — HYDRALAZINE HCL 20 MG/ML IJ SOLN: 10.0000 mg | INTRAMUSCULAR | Status: DC | PRN

## 2021-07-26 MED ORDER — PANTOPRAZOLE SODIUM 40 MG PO TBEC
40.0000 mg | DELAYED_RELEASE_TABLET | Freq: Every day | ORAL | Status: DC
  Administered 2021-07-26 – 2021-08-08 (×14): 40 mg via ORAL
  Filled 2021-07-26 (×14): qty 1

## 2021-07-26 MED ORDER — CEFAZOLIN SODIUM-DEXTROSE 2-4 GM/100ML-% IV SOLN
2.0000 g | Freq: Three times a day (TID) | INTRAVENOUS | Status: AC
  Administered 2021-07-26 (×2): 2 g via INTRAVENOUS
  Filled 2021-07-26 (×2): qty 100

## 2021-07-26 MED ORDER — PROPOFOL 10 MG/ML IV BOLUS
INTRAVENOUS | Status: DC | PRN
  Administered 2021-07-26: 120 mg via INTRAVENOUS
  Administered 2021-07-26: 30 mg via INTRAVENOUS

## 2021-07-26 MED ORDER — PROPOFOL 10 MG/ML IV BOLUS
INTRAVENOUS | Status: AC
  Filled 2021-07-26: qty 20

## 2021-07-26 MED ORDER — HEPARIN 6000 UNIT IRRIGATION SOLUTION
Status: DC | PRN
  Administered 2021-07-26: 1

## 2021-07-26 MED ORDER — PROTAMINE SULFATE 10 MG/ML IV SOLN
INTRAVENOUS | Status: DC | PRN
  Administered 2021-07-26: 20 mg via INTRAVENOUS

## 2021-07-26 MED ORDER — INSULIN GLARGINE-YFGN 100 UNIT/ML ~~LOC~~ SOLN
10.0000 [IU] | Freq: Every day | SUBCUTANEOUS | Status: DC
  Administered 2021-07-26 – 2021-07-30 (×5): 10 [IU] via SUBCUTANEOUS
  Filled 2021-07-26 (×7): qty 0.1

## 2021-07-26 MED ORDER — LABETALOL HCL 5 MG/ML IV SOLN
10.0000 mg | INTRAVENOUS | Status: AC | PRN
  Administered 2021-07-27 – 2021-07-28 (×4): 10 mg via INTRAVENOUS
  Filled 2021-07-26 (×4): qty 4

## 2021-07-26 MED ORDER — MORPHINE SULFATE (PF) 2 MG/ML IV SOLN
2.0000 mg | INTRAVENOUS | Status: DC | PRN
  Administered 2021-07-29 – 2021-07-30 (×3): 2 mg via INTRAVENOUS
  Filled 2021-07-26 (×3): qty 1

## 2021-07-26 MED ORDER — ATORVASTATIN CALCIUM 40 MG PO TABS
40.0000 mg | ORAL_TABLET | Freq: Every day | ORAL | Status: DC
  Administered 2021-07-26 – 2021-08-07 (×13): 40 mg via ORAL
  Filled 2021-07-26 (×13): qty 1

## 2021-07-26 MED ORDER — CEFAZOLIN SODIUM-DEXTROSE 2-4 GM/100ML-% IV SOLN
INTRAVENOUS | Status: AC
  Filled 2021-07-26: qty 100

## 2021-07-26 MED ORDER — 0.9 % SODIUM CHLORIDE (POUR BTL) OPTIME
TOPICAL | Status: DC | PRN
  Administered 2021-07-26 (×2): 1000 mL

## 2021-07-26 MED ORDER — ACETAMINOPHEN 325 MG PO TABS
325.0000 mg | ORAL_TABLET | ORAL | Status: DC | PRN
  Administered 2021-07-29 – 2021-08-07 (×8): 650 mg via ORAL
  Filled 2021-07-26 (×8): qty 2

## 2021-07-26 MED ORDER — HEPARIN SODIUM (PORCINE) 5000 UNIT/ML IJ SOLN
5000.0000 [IU] | Freq: Three times a day (TID) | INTRAMUSCULAR | Status: DC
  Administered 2021-07-27 – 2021-07-28 (×4): 5000 [IU] via SUBCUTANEOUS
  Filled 2021-07-26 (×4): qty 1

## 2021-07-26 MED ORDER — IOHEXOL 350 MG/ML SOLN
100.0000 mL | Freq: Once | INTRAVENOUS | Status: AC | PRN
  Administered 2021-07-26: 100 mL via INTRAVENOUS

## 2021-07-26 MED ORDER — ACETAMINOPHEN 500 MG PO TABS: 1000.0000 mg | ORAL_TABLET | Freq: Once | ORAL | Status: AC

## 2021-07-26 MED ORDER — FENTANYL CITRATE (PF) 250 MCG/5ML IJ SOLN
INTRAMUSCULAR | Status: AC
  Filled 2021-07-26: qty 5

## 2021-07-26 MED ORDER — SODIUM CHLORIDE 0.9 % IV SOLN
500.0000 mL | Freq: Once | INTRAVENOUS | Status: AC | PRN
Start: 2021-07-26 — End: 2021-08-01
  Administered 2021-08-01: 500 mL via INTRAVENOUS

## 2021-07-26 MED ORDER — GUAIFENESIN-DM 100-10 MG/5ML PO SYRP: 15.0000 mL | ORAL_SOLUTION | ORAL | Status: DC | PRN

## 2021-07-26 MED ORDER — OXYCODONE HCL 5 MG PO TABS: 5.0000 mg | ORAL_TABLET | Freq: Once | ORAL | Status: DC | PRN

## 2021-07-26 MED ORDER — CEFAZOLIN SODIUM-DEXTROSE 2-4 GM/100ML-% IV SOLN
2.0000 g | INTRAVENOUS | Status: AC
  Administered 2021-07-26: 2 g via INTRAVENOUS

## 2021-07-26 MED ORDER — ALUM & MAG HYDROXIDE-SIMETH 200-200-20 MG/5ML PO SUSP: 15.0000 mL | ORAL | Status: DC | PRN

## 2021-07-26 MED ORDER — DOCUSATE SODIUM 100 MG PO CAPS
100.0000 mg | ORAL_CAPSULE | Freq: Every day | ORAL | Status: DC
  Administered 2021-07-27 – 2021-08-07 (×11): 100 mg via ORAL
  Filled 2021-07-26 (×12): qty 1

## 2021-07-26 MED ORDER — CHLORHEXIDINE GLUCONATE 0.12 % MT SOLN: 15.0000 mL | Freq: Once | OROMUCOSAL | Status: AC

## 2021-07-26 MED ORDER — ONDANSETRON HCL 4 MG/2ML IJ SOLN
INTRAMUSCULAR | Status: DC | PRN
  Administered 2021-07-26: 4 mg via INTRAVENOUS

## 2021-07-26 MED ORDER — POTASSIUM CHLORIDE CRYS ER 20 MEQ PO TBCR: 20.0000 meq | EXTENDED_RELEASE_TABLET | Freq: Every day | ORAL | Status: DC | PRN

## 2021-07-26 MED ORDER — HYDRALAZINE HCL 20 MG/ML IJ SOLN: 5.0000 mg | INTRAMUSCULAR | Status: DC | PRN

## 2021-07-26 MED ORDER — FENTANYL CITRATE (PF) 100 MCG/2ML IJ SOLN: 25.0000 ug | INTRAMUSCULAR | Status: DC | PRN

## 2021-07-26 MED ORDER — FENTANYL CITRATE (PF) 250 MCG/5ML IJ SOLN
INTRAMUSCULAR | Status: DC | PRN
Start: 2021-07-26 — End: 2021-07-26
  Administered 2021-07-26 (×2): 50 ug via INTRAVENOUS

## 2021-07-26 MED ORDER — ONDANSETRON HCL 4 MG/2ML IJ SOLN
INTRAMUSCULAR | Status: AC
  Filled 2021-07-26: qty 2

## 2021-07-26 MED ORDER — INSULIN ASPART 100 UNIT/ML IJ SOLN: 0.0000 [IU] | INTRAMUSCULAR | Status: DC | PRN

## 2021-07-26 MED ORDER — METOPROLOL TARTRATE 50 MG PO TABS
50.0000 mg | ORAL_TABLET | Freq: Two times a day (BID) | ORAL | Status: DC
  Administered 2021-07-26 – 2021-07-28 (×4): 50 mg via ORAL
  Filled 2021-07-26 (×4): qty 1

## 2021-07-26 MED ORDER — ORAL CARE MOUTH RINSE: 15.0000 mL | Freq: Once | OROMUCOSAL | Status: AC

## 2021-07-26 MED ORDER — AMISULPRIDE (ANTIEMETIC) 5 MG/2ML IV SOLN: 10.0000 mg | Freq: Once | INTRAVENOUS | Status: DC | PRN

## 2021-07-26 MED ORDER — SODIUM CHLORIDE 0.9 % IV SOLN: INTRAVENOUS | Status: AC

## 2021-07-26 MED ORDER — HEPARIN 6000 UNIT IRRIGATION SOLUTION
Status: AC
  Filled 2021-07-26: qty 500

## 2021-07-26 MED ORDER — LIDOCAINE 2% (20 MG/ML) 5 ML SYRINGE
INTRAMUSCULAR | Status: AC
  Filled 2021-07-26: qty 5

## 2021-07-26 MED ORDER — HEMOSTATIC AGENTS (NO CHARGE) OPTIME
TOPICAL | Status: DC | PRN
  Administered 2021-07-26: 1 via TOPICAL

## 2021-07-26 MED ORDER — OXYCODONE HCL 5 MG/5ML PO SOLN: 5.0000 mg | Freq: Once | ORAL | Status: DC | PRN

## 2021-07-26 MED ORDER — PHENOL 1.4 % MT LIQD: 1.0000 | OROMUCOSAL | Status: DC | PRN

## 2021-07-26 MED ORDER — HEPARIN SODIUM (PORCINE) 1000 UNIT/ML IJ SOLN
INTRAMUSCULAR | Status: DC | PRN
  Administered 2021-07-26: 5000 [IU] via INTRAVENOUS

## 2021-07-26 MED ORDER — INSULIN ASPART 100 UNIT/ML IJ SOLN
0.0000 [IU] | Freq: Three times a day (TID) | INTRAMUSCULAR | Status: DC
  Administered 2021-07-26: 7 [IU] via SUBCUTANEOUS
  Administered 2021-07-26: 3 [IU] via SUBCUTANEOUS
  Administered 2021-07-27: 2 [IU] via SUBCUTANEOUS
  Administered 2021-07-27 (×2): 3 [IU] via SUBCUTANEOUS
  Administered 2021-07-28 – 2021-07-29 (×4): 2 [IU] via SUBCUTANEOUS
  Administered 2021-07-29: 7 [IU] via SUBCUTANEOUS
  Administered 2021-07-29 (×2): 9 [IU] via SUBCUTANEOUS
  Administered 2021-07-30: 1 [IU] via SUBCUTANEOUS
  Administered 2021-07-30: 5 [IU] via SUBCUTANEOUS
  Administered 2021-07-30 – 2021-07-31 (×3): 3 [IU] via SUBCUTANEOUS
  Administered 2021-07-31: 9 [IU] via SUBCUTANEOUS
  Administered 2021-08-01: 2 [IU] via SUBCUTANEOUS
  Administered 2021-08-01: 3 [IU] via SUBCUTANEOUS
  Administered 2021-08-01: 1 [IU] via SUBCUTANEOUS
  Administered 2021-08-02: 2 [IU] via SUBCUTANEOUS
  Administered 2021-08-02: 5 [IU] via SUBCUTANEOUS
  Administered 2021-08-02: 2 [IU] via SUBCUTANEOUS
  Administered 2021-08-04: 1 [IU] via SUBCUTANEOUS
  Administered 2021-08-05: 3 [IU] via SUBCUTANEOUS
  Administered 2021-08-05: 5 [IU] via SUBCUTANEOUS
  Administered 2021-08-06: 1 [IU] via SUBCUTANEOUS
  Administered 2021-08-07: 5 [IU] via SUBCUTANEOUS

## 2021-07-26 MED ORDER — ACETAMINOPHEN 500 MG PO TABS
ORAL_TABLET | ORAL | Status: AC
  Administered 2021-07-26: 1000 mg via ORAL
  Filled 2021-07-26: qty 2

## 2021-07-26 MED ORDER — HEPARIN SODIUM (PORCINE) 1000 UNIT/ML IJ SOLN
INTRAMUSCULAR | Status: AC
  Filled 2021-07-26: qty 10

## 2021-07-26 MED ORDER — LABETALOL HCL 5 MG/ML IV SOLN
INTRAVENOUS | Status: DC | PRN
  Administered 2021-07-26 (×2): 5 mg via INTRAVENOUS

## 2021-07-26 MED ORDER — PHENYLEPHRINE HCL-NACL 20-0.9 MG/250ML-% IV SOLN
INTRAVENOUS | Status: DC | PRN
  Administered 2021-07-26: 25 ug/min via INTRAVENOUS

## 2021-07-26 MED ORDER — OXYCODONE-ACETAMINOPHEN 5-325 MG PO TABS
1.0000 | ORAL_TABLET | ORAL | Status: DC | PRN
  Administered 2021-07-26 – 2021-07-27 (×3): 1 via ORAL
  Administered 2021-07-28: 2 via ORAL
  Administered 2021-07-28: 1 via ORAL
  Administered 2021-07-29 – 2021-07-30 (×3): 2 via ORAL
  Filled 2021-07-26: qty 1
  Filled 2021-07-26 (×3): qty 2
  Filled 2021-07-26 (×2): qty 1
  Filled 2021-07-26 (×2): qty 2
  Filled 2021-07-26: qty 1
  Filled 2021-07-26: qty 2

## 2021-07-26 MED ORDER — LACTATED RINGERS IV SOLN: INTRAVENOUS | Status: DC

## 2021-07-26 MED ORDER — DEXAMETHASONE SODIUM PHOSPHATE 10 MG/ML IJ SOLN
INTRAMUSCULAR | Status: AC
  Filled 2021-07-26: qty 1

## 2021-07-26 MED ORDER — ONDANSETRON HCL 4 MG/2ML IJ SOLN
4.0000 mg | Freq: Four times a day (QID) | INTRAMUSCULAR | Status: DC | PRN
  Administered 2021-08-01: 4 mg via INTRAVENOUS
  Filled 2021-07-26: qty 2

## 2021-07-26 MED ORDER — PHENYLEPHRINE 40 MCG/ML (10ML) SYRINGE FOR IV PUSH (FOR BLOOD PRESSURE SUPPORT)
PREFILLED_SYRINGE | INTRAVENOUS | Status: AC
  Filled 2021-07-26: qty 10

## 2021-07-26 MED ORDER — SUGAMMADEX SODIUM 200 MG/2ML IV SOLN
INTRAVENOUS | Status: DC | PRN
  Administered 2021-07-26 (×2): 100 mg via INTRAVENOUS

## 2021-07-26 MED ORDER — CHLORHEXIDINE GLUCONATE 0.12 % MT SOLN
OROMUCOSAL | Status: AC
  Administered 2021-07-26: 15 mL via OROMUCOSAL
  Filled 2021-07-26: qty 15

## 2021-07-26 MED ORDER — PHENYLEPHRINE 40 MCG/ML (10ML) SYRINGE FOR IV PUSH (FOR BLOOD PRESSURE SUPPORT)
PREFILLED_SYRINGE | INTRAVENOUS | Status: DC | PRN
  Administered 2021-07-26: 80 ug via INTRAVENOUS
  Administered 2021-07-26 (×4): 40 ug via INTRAVENOUS

## 2021-07-26 MED ORDER — ACETAMINOPHEN 650 MG RE SUPP: 325.0000 mg | RECTAL | Status: DC | PRN

## 2021-07-26 MED ORDER — LISINOPRIL 20 MG PO TABS
20.0000 mg | ORAL_TABLET | Freq: Every day | ORAL | Status: DC
  Administered 2021-07-28 – 2021-07-31 (×4): 20 mg via ORAL
  Filled 2021-07-26 (×4): qty 1

## 2021-07-26 SURGICAL SUPPLY — 38 items
ADH SKN CLS APL DERMABOND .7 (GAUZE/BANDAGES/DRESSINGS) ×1
BAG COUNTER SPONGE SURGICOUNT (BAG) ×3 IMPLANT
BAG SPNG CNTER NS LX DISP (BAG) ×1
CANISTER SUCT 3000ML PPV (MISCELLANEOUS) ×3 IMPLANT
CLIP LIGATING EXTRA MED SLVR (CLIP) ×3 IMPLANT
CLIP LIGATING EXTRA SM BLUE (MISCELLANEOUS) ×6 IMPLANT
DERMABOND ADVANCED (GAUZE/BANDAGES/DRESSINGS) ×1
DERMABOND ADVANCED .7 DNX12 (GAUZE/BANDAGES/DRESSINGS) ×2 IMPLANT
DRAIN CHANNEL 15F RND FF W/TCR (WOUND CARE) IMPLANT
DRAPE INCISE IOBAN 66X45 STRL (DRAPES) ×1 IMPLANT
ELECT REM PT RETURN 9FT ADLT (ELECTROSURGICAL) ×2
ELECTRODE REM PT RTRN 9FT ADLT (ELECTROSURGICAL) ×2 IMPLANT
EVACUATOR SILICONE 100CC (DRAIN) IMPLANT
GLOVE BIO SURGEON STRL SZ7.5 (GLOVE) ×4 IMPLANT
GLOVE BIOGEL PI IND STRL 8 (GLOVE) ×2 IMPLANT
GLOVE BIOGEL PI INDICATOR 8 (GLOVE) ×1
GOWN STRL REUS W/ TWL LRG LVL3 (GOWN DISPOSABLE) ×6 IMPLANT
GOWN STRL REUS W/TWL 2XL LVL3 (GOWN DISPOSABLE) ×5 IMPLANT
GOWN STRL REUS W/TWL LRG LVL3 (GOWN DISPOSABLE) ×4
HEMOSTAT SNOW SURGICEL 2X4 (HEMOSTASIS) ×1 IMPLANT
KIT BASIN OR (CUSTOM PROCEDURE TRAY) ×3 IMPLANT
KIT TURNOVER KIT B (KITS) ×3 IMPLANT
NS IRRIG 1000ML POUR BTL (IV SOLUTION) ×6 IMPLANT
PACK PERIPHERAL VASCULAR (CUSTOM PROCEDURE TRAY) ×3 IMPLANT
PAD ARMBOARD 7.5X6 YLW CONV (MISCELLANEOUS) ×6 IMPLANT
PATCH VASC XENOSURE 1CMX6CM (Vascular Products) ×2 IMPLANT
PATCH VASC XENOSURE 1X6 (Vascular Products) IMPLANT
SUT MNCRL AB 4-0 PS2 18 (SUTURE) ×3 IMPLANT
SUT PROLENE 5 0 C 1 24 (SUTURE) ×3 IMPLANT
SUT PROLENE 6 0 BV (SUTURE) ×3 IMPLANT
SUT VIC AB 2-0 CT1 27 (SUTURE) ×4
SUT VIC AB 2-0 CT1 TAPERPNT 27 (SUTURE) ×2 IMPLANT
SUT VIC AB 3-0 SH 27 (SUTURE) ×2
SUT VIC AB 3-0 SH 27X BRD (SUTURE) ×2 IMPLANT
TOWEL GREEN STERILE (TOWEL DISPOSABLE) ×3 IMPLANT
TRAY FOLEY MTR SLVR 16FR STAT (SET/KITS/TRAYS/PACK) IMPLANT
UNDERPAD 30X36 HEAVY ABSORB (UNDERPADS AND DIAPERS) ×3 IMPLANT
WATER STERILE IRR 1000ML POUR (IV SOLUTION) ×3 IMPLANT

## 2021-07-26 NOTE — Op Note (Signed)
? ? ?  NAME: [name]    MRN: [id number] ?DOB: [birth year]    DATE OF OPERATION: 07/26/2021 ? ?PREOP DIAGNOSIS:   ? ?Left lower extremity Rutherford 4 critical limb ischemia ? ?POSTOP DIAGNOSIS:   ? ?Same ? ?PROCEDURE:  ?  ?Left common femoral endarterectomy ? ?SURGEON: Victorino Sparrow ? ?ASSIST: Aggie Moats, PA ? ?ANESTHESIA: General ? ?EBL: 50 mL ? ?INDICATIONS:  ? ? [name] is a 88 y.o. female Rutherford 4 critical limb ischemia in the left leg.  Most recent angiogram demonstrated near-occlusion of the left common femoral artery, with profunda only runoff, and reconstitution of the posterior tibial artery distally.  This vessel was very small.  Being that [name] has rest pain, no tissue loss, we discussed left-sided common femoral endarterectomy.  I think this should relieve her rest pain.  After discussing the risk and benefits, [name] elected to proceed. ? ?FINDINGS:  ? ?Severe atherosclerotic disease in the left common femoral artery ? ?TECHNIQUE:  ? ?Patient is brought to the OR laid in the supine position.  General anesthesia was induced and the patient was prepped and draped in standard fashion. ? ?The case began with a longitudinal incision in the left groin over the common femoral artery.  The common femoral artery was exposed in standard fashion and external iliac, superficial femoral artery, profunda were trolled using Vesseloops.  The patient was heparinized with 5000 units IV heparin, arteries clamped, and an 11 blade was used to make a longitudinal arteriotomy.  This was extended with use of Potts scissors.  The lumen of the artery demonstrated severe atherosclerotic disease with greater than 80% stenosis.  This was endarterectomized using a Runner, broadcasting/film/video.  Special care was taken to ensure the ostia of the profunda and superficial femoral artery were not disrupted.  Next, a bovine pericardial patch was brought onto the field and sewn in running fashion using 5-0 Prolene suture.   Prior to completion of the anastomosis, arteries were backbled to ensure there was no thrombus.  After completion, Ultrasound was brought to the field and the common femoral artery was instigated both proximally and distally.  There were excellent signals in the external iliac artery, superficial femoral artery, profunda.  At this point, heparin was reversed with the use of protamine.  The wound bed was irrigated with saline, and closed in layers of Vicryl suture with Monocryl and Dermabond at the skin. ? ?Given the complexity of the case,  the assistant was necessary in order to expedient the procedure and safely perform the technical aspects of the operation.  The assistant provided traction and countertraction to assist with exposure of the artery.  They also assisted with suture ligation of multiple venous branches.  They played a critical role in helping facilitate sewing the patch. These skills, especially following the Prolene suture for the anastomosis, could not have been adequately performed by a scrub tech assistant.  ? ? ?Ladonna Snide, MD ?[facility] ? ?DATE OF DICTATION:   07/26/2021 ? ?

## 2021-07-26 NOTE — Anesthesia Procedure Notes (Addendum)
Procedure Name: Intubation ?Date/Time: 07/26/2021 7:49 AM ?Performed by: Dorthea Cove, CRNA ?Pre-anesthesia Checklist: Patient identified, Emergency Drugs available, Suction available and Patient being monitored ?Patient Re-evaluated:Patient Re-evaluated prior to induction ?Oxygen Delivery Method: Circle system utilized ?Preoxygenation: Pre-oxygenation with 100% oxygen ?Induction Type: IV induction ?Ventilation: Mask ventilation without difficulty ?Laryngoscope Size: Mac and 3 ?Grade View: Grade I ?Tube type: Oral ?Tube size: 7.0 mm ?Number of attempts: 1 ?Airway Equipment and Method: Stylet and Oral airway ?Placement Confirmation: ETT inserted through vocal cords under direct vision, positive ETCO2 and breath sounds checked- equal and bilateral ?Secured at: 21 cm ?Tube secured with: Tape ?Dental Injury: Teeth and Oropharynx as per pre-operative assessment  ?Comments: Successful DL intubation by Genie, paramedic student ? ? ? ? ?

## 2021-07-26 NOTE — Progress Notes (Signed)
Patient's HR elevated to 157 for a short run. Patient dropped back down the the 80s. Patient asymptomatic. RN verified with telemetry and they confirmed. Paged MD and will come to bedside to see patient.  ?[name]  ?

## 2021-07-26 NOTE — Progress Notes (Signed)
Patient's son approached RN and had concerns about his mother's face looking "Swollen". RN assessed patient and performed a modified NIH scale. Patient seemed to have a slight left facial droop and left sided grip. RN informed charge and was told to call rapid response nurse. Called rapid and stroke RN. Vitals obtained and maintained at bedside.  ?[place] C Klay Sobotka  ?

## 2021-07-26 NOTE — H&P (Signed)
?Office Note  ? ?Patient seen and examined in preop holding.  No complaints. ?No changes to medication history or physical exam since last seen at time of angiography 2 weeks ago. ?In short, [name] is an 88 year old female with Rutherford 4 critical limb ischemia in the left leg.  Most recent angiogram demonstrated near-occlusion of the left common femoral artery, with profunda only runoff, and reconstitution of the posterior tibial artery distally.  This vessel was very small.  Being that [name] has rest pain, no tissue loss, we discussed left-sided common femoral endarterectomy.  I think this should relieve her rest pain.  After discussing the risk and benefits, [name] elected to proceed. ? ? ?Broadus John MD ? ? ?CC:  follow up ?Requesting Provider:  No ref. provider found ? ?HPI: [name] is a 88 y.o. (1933-03-26) female who presents for follow up of PAD. She has remote history of left above knee to below knee popliteal artery bypass graft on 07/23/10. Her bypass unfortunately occluded shortly after its placement. Her ABI's have been non compressible with dampened TBI's. Her symptoms thus far have been not lifestyle limiting. She explains that time of her last visit she was not having pain on that particular day but that the pain was intermittent. Now she says her pain is daily. Describes it as " just a pain". It is equal in both legs. She says she has not paid it much attention as far as if one hurts more than the other. The pain is in both the shin and calf of her legs. This occurs at rest. It is not worsened by ambulation. It does wake her up at night. She gets some relief with Diclofenac gel and also transdermal pain patches. She denies any pain in her thighs or feet. No non healing wounds.  ? ?The pt is on a statin for cholesterol management.  ?The pt is not on a daily aspirin.   Other AC:  Eliquis for atrial fibrillation ?The pt is on ACE, CCB and BB for hypertension.   ?The pt is diabetic.    ?Tobacco hx:  never ? ?Past Medical History:  ?Diagnosis Date  ? Arthritis   ? Atrial fibrillation ([place])   ? Diabetes mellitus   ? DVT (deep venous thrombosis) ([place])   ? Hyperlipidemia   ? Hypertension   ? Leg pain   ? Peripheral arterial disease ([place])   ? Stroke [facility]) 1999  ? ? ?Past Surgical History:  ?Procedure Laterality Date  ? ABDOMINAL AORTOGRAM W/LOWER EXTREMITY N/A 07/11/2021  ? Procedure: ABDOMINAL AORTOGRAM W/LOWER EXTREMITY;  Surgeon: Broadus John, MD;  Location: [place] [facility];  Service: Cardiovascular;  Laterality: N/A;  ? [place]  ? CARDIOVERSION N/A 02/15/2020  ? Procedure: CARDIOVERSION;  Surgeon: Thayer Headings, MD;  Location: [place];  Service: Cardiovascular;  Laterality: N/A;  ? CATARACT EXTRACTION  2000  ? CESAREAN SECTION    ? EYE SURGERY    ? FEMORAL BYPASS Left July 23, 2010  ? LUMBAR DISC SURGERY    ? SPINE SURGERY    ? ? ?Social History  ? ?Socioeconomic History  ? Marital status: Widowed  ?  Spouse name: Not on file  ? Number of children: 1  ? Years of education: Not on file  ? Highest education level: Not on file  ?Occupational History  ? Occupation: Retired  ?  Comment: retail Financial controller  ?Tobacco Use  ? Smoking status: Never  ?  Smokeless tobacco: Never  ?Vaping Use  ? Vaping Use: Never used  ?Substance and Sexual Activity  ? Alcohol use: No  ? Drug use: No  ? Sexual activity: Not on file  ?Other Topics Concern  ? Not on file  ?Social History Narrative  ? Lives alone. Has daily contact with friends. Transportation by friends and sometimes [employer].  ? ?Social Determinants of Health  ? ?Financial Resource Strain: Not on file  ?Food Insecurity: No Food Insecurity  ? Worried About Charity fundraiser in the Last Year: Never true  ? Ran Out of Food in the Last Year: Never true  ?Transportation Needs: No Transportation Needs  ? Lack of Transportation (Medical): No  ? Lack of Transportation (Non-Medical): No  ?Physical Activity: Not on file   ?Stress: Not on file  ?Social Connections: Not on file  ?Intimate Partner Violence: Not on file  ? ? ?Family History  ?Problem Relation Age of Onset  ? Hypertension Mother   ? Heart attack Mother   ? Diabetes Father   ? Hypertension Father   ? Stroke Father   ? Stroke Sister   ? Diabetes Sister   ? Heart disease Sister   ?     Before age 63-Aneurysm  ? Dementia Sister   ? Alzheimer's disease Sister   ? Diabetes Brother   ? Diabetes Brother   ? Heart disease Brother   ?     Before age 7  ? Heart attack Brother   ? Heart attack Sister   ? Diabetes Sister   ? Alzheimer's disease Sister   ? ? ?Current Facility-Administered Medications  ?Medication Dose Route Frequency Provider Last Rate Last Admin  ? 0.9 %  sodium chloride infusion   Intravenous Continuous Broadus John, MD      ? ceFAZolin (ANCEF) 2-4 GM/100ML-% IVPB           ? ceFAZolin (ANCEF) IVPB 2g/100 mL premix  2 g Intravenous 30 min Pre-Op Broadus John, MD      ? Chlorhexidine Gluconate Cloth 2 % PADS 6 each  6 each Topical Once Broadus John, MD      ? And  ? Chlorhexidine Gluconate Cloth 2 % PADS 6 each  6 each Topical Once Broadus John, MD      ? insulin aspart (novoLOG) injection 0-7 Units  0-7 Units Subcutaneous Q2H PRN Merlinda Frederick, MD      ? lactated ringers infusion   Intravenous Continuous Merlinda Frederick, MD      ? ? ?No Known Allergies ? ? ?REVIEW OF SYSTEMS:  ? ?[X]  denotes positive finding, [ ]  denotes negative finding ?Cardiac  Comments:  ?Chest pain or chest pressure:    ?Shortness of breath upon exertion:    ?Short of breath when lying flat:    ?Irregular heart rhythm:    ?    ?Vascular    ?Pain in calf, thigh, or hip brought on by ambulation:    ?Pain in feet at night that wakes you up from your sleep:     ?Blood clot in your veins:    ?Leg swelling:     ?    ?Pulmonary    ?Oxygen at home:    ?Productive cough:     ?Wheezing:     ?    ?Neurologic    ?Sudden weakness in arms or legs:     ?Sudden numbness in arms or legs:      ?Sudden  onset of difficulty speaking or slurred speech:    ?Temporary loss of vision in one eye:     ?Problems with dizziness:     ?    ?Gastrointestinal    ?Blood in stool:     ?Vomited blood:     ?    ?Genitourinary    ?Burning when urinating:     ?Blood in urine:    ?    ?Psychiatric    ?Major depression:     ?    ?Hematologic    ?Bleeding problems:    ?Problems with blood clotting too easily:    ?    ?Skin    ?Rashes or ulcers:    ?    ?Constitutional    ?Fever or chills:    ? ? ?PHYSICAL EXAMINATION: ? ?Vitals:  ? 07/26/21 0609 07/26/21 0616  ?BP: (!) 174/76 (!) 166/90  ?Pulse: 70   ?Resp: 16   ?Temp: 97.8 ?F (36.6 ?C)   ?TempSrc: Oral   ?SpO2: 97%   ?Weight: 38.1 kg   ?Height: 4\' 11"  (1.499 m)   ? ? ?General:  WDWN in NAD; vital signs documented above ?Gait: Normal, ambulates with cane ?HENT: WNL, normocephalic ?Pulmonary: normal non-labored breathing , without wheezing ?Cardiac: irregular HR ?Abdomen: soft, NT, no masses ?Vascular Exam/Pulses: ? Right Left  ?Radial 2+ (normal) 2+ (normal)  ?Femoral 2+ (normal) 2+ (normal)  ?Popliteal Not palpable Not palpable  ?DP absent absent  ?PT absent absent  ? ?Extremities: without ischemic changes, without Gangrene , without cellulitis; without open wounds;  ?Musculoskeletal: no muscle wasting or atrophy  ?Neurologic: A&O X 3;  No focal weakness or paresthesias are detected ?Psychiatric:  The pt has Normal affect. ? ? ?Non-Invasive Vascular Imaging:   ?+-------+-----------+-----------+------------+------------+  ?ABI/TBIToday's ABIToday's TBIPrevious ABIPrevious TBI  ?+-------+-----------+-----------+------------+------------+  ?Right  [place]         0.40            [place]             0.70        ?+-------+-----------+-----------+------------+------------+  ?Left   [place]         0.17              [place]         0.33          ?+-------+-----------+-----------+------------+------------+  ?Right great toe pressure 36 mm Hg ?Left great toe pressure 84  mmHg ? ? ?ASSESSMENT/PLAN:: 88 y.o. female here for follow up for PAD. She has history of failed LLE Ak to BK popliteal bypass. Her symptoms have been non disabling. Her symptoms are now worse with rest pain in both legs. Her ABIs today remai

## 2021-07-26 NOTE — Transfer of Care (Signed)
Immediate Anesthesia Transfer of Care Note ? ?Patient: [name] ? ?Procedure(s) Performed: LEFT COMMON FEMORAL ENDARTERECTOMY (Left: Groin) ?PATCH ANGIOPLASTY WITH XENOSURE BIOLOGIC 1 cm x 6 cm PATCH (Left: Groin) ? ?Patient Location: PACU ? ?Anesthesia Type:General ? ?Level of Consciousness: awake, drowsy and patient cooperative ? ?Airway & Oxygen Therapy: Patient Spontanous Breathing ? ?Post-op Assessment: Report given to RN and Post -op Vital signs reviewed and stable ? ?Post vital signs: Reviewed and stable ? ?Last Vitals:  ?Vitals Value Taken Time  ?BP 154/99 07/26/21 0940  ?Temp    ?Pulse 69 07/26/21 0944  ?Resp 19 07/26/21 0944  ?SpO2 92 % 07/26/21 0944  ?Vitals shown include unvalidated device data. ? ?Last Pain:  ?Vitals:  ? 07/26/21 0609  ?TempSrc: Oral  ?PainSc: 0-No pain  ?   ? ?  ? ?Complications: No notable events documented. ?

## 2021-07-26 NOTE — Anesthesia Procedure Notes (Signed)
Arterial Line Insertion ?Start/End4/13/2023 7:15 AM, 07/26/2021 7:25 AM ?Performed by: Lelon Perla, CRNA, CRNA ? Patient location: Pre-op. ?Preanesthetic checklist: patient identified, IV checked, site marked, risks and benefits discussed, surgical consent, monitors and equipment checked, pre-op evaluation, timeout performed and anesthesia consent ?Lidocaine 1% used for infiltration ?Left, radial was placed ?Catheter size: 20 G ?Hand hygiene performed  and maximum sterile barriers used  ? ?Attempts: 1 ?Procedure performed without using ultrasound guided technique. ?Following insertion, dressing applied. ?Post procedure assessment: normal and unchanged ? ?Patient tolerated the procedure well with no immediate complications. ? ? ?

## 2021-07-26 NOTE — Progress Notes (Signed)
Patient transferred to 4E from PACU. Vitals taken and stable. Patient placed on tele and CCMD notified. CHG bath completed. Groin site assessed and level 0. Patient oriented to self and place. Call bell within reach.  ?[name]  ?

## 2021-07-26 NOTE — Code Documentation (Signed)
Stroke Response Nurse Documentation ?Code Documentation ? ?ACELYN SUPINO is a 88 y.o. female admitted to [facility]  on 07/26/21 for left common femoral endarterectomy with past medical hx of DM, HLD, HTN, Stroke 1999, DVT, afib on Eliquis. On Eliquis (apixaban) daily though Eliquis had been on hold x2 days for surgery. Patient heparanized during surgery then reversed. Code stroke was activated by 4E RN.  ? ?Patient on 4E unit where she was returned from procedure at 1045 and was seen without deficits. Son called out for RN at 1145 stating patient's face "swollen". RN noted slight left facial droop and weak left grip.  SRN notified, code stroke called.   ? ? ?Stroke team at the bedside after patient activation. Patient to CT with team. NIHSS 2, see documentation for details and code stroke times. Patient with disoriented and left facial droop on exam.  ? ?The following imaging was completed:  CT Head, CTA, and CTP. Patient is not a candidate for IV Thrombolytic due to symptoms mild. Patient is not a candidate for IR due to no LVO.  ? ?Care/Plan: q2h mNIHSS and vitals, permissive HTN BP <220/110.  ? ?Bedside handoff with RN [place].   ? ?Newman Nickels  ?Stroke Response RN ? ? ?

## 2021-07-26 NOTE — Consult Note (Signed)
NEUROLOGY CONSULTATION NOTE  ? ?Date of service: July 26, 2021 ?Patient Name: [name] ?MRN:  [id number] ?DOB:  04-11-1933 ?Reason for consult: "Stroke code for concern for facial droop" ?Requesting Provider: Broadus John, MD ?_ _ _   _ __   _ __ _ _  __ __   _ __   __ _ ? ?History of Present Illness  ?[name] is a 88 y.o. female with PMH significant for afibb on eliquis, DM2, DVTs, HTN, HLD, Peripheral arterial disease who is admitted with ischemic L leg and underwent L femoral endarterectomy. Her eliquis was held for 2 days in preparation for the surgery. ? ?Some concern for L facial puffing and asymmetric facial smile after the srugery and a code stroke was activated. ? ?On arrival, she is oriented to self, but unable to recall her age or the month. Son at bedside reports she can recall month ona good day but not on a bad day. She does not have a diagnosis of dementia but the family has noticed trouble with her memory. She does not have a facial droop, no arm or leg weakness. Was unable to do finger to nose with LUE but was able to squeeze my finger and push and pull on command. ? ? ? ?LKW: unclear, suspect around 1045AM? ?mRS: 3 ?tNKASE: not offered, unlikely to be stroke. ?Thrombectomy: not offered, low concern for stroke. No LVO. ?NIHSS components Score: Comment  ?1a Level of Conscious 0[x]  1[]  2[]  3[]      ?1b LOC Questions 0[]  1[]  2[x]       ?1c LOC Commands 0[x]  1[]  2[]       ?2 Best Gaze 0[x]  1[]  2[]       ?3 Visual 0[x]  1[]  2[]  3[]      ?4 Facial Palsy 0[x]  1[]  2[]  3[]      ?5a Motor Arm - left 0[x]  1[]  2[]  3[]  4[]  UN[]    ?5b Motor Arm - Right 0[x]  1[]  2[]  3[]  4[]  UN[]    ?6a Motor Leg - Left 0[x]  1[]  2[]  3[]  4[]  UN[]    ?6b Motor Leg - Right 0[x]  1[]  2[]  3[]  4[]  UN[]    ?7 Limb Ataxia 0[x]  1[]  2[]  3[]  UN[]     ?8 Sensory 0[x]  1[]  2[]  UN[]      ?9 Best Language 0[]  1[x]  2[]  3[]      ?10 Dysarthria 0[x]  1[]  2[]  UN[]      ?11 Extinct. and Inattention 0[x]  1[]  2[]       ?TOTAL: 3   ?  ?ROS  ? ?Unable  to obtain due to review of system due to underlying dementia and bradyphrenia. ? ?Past History  ? ?Past Medical History:  ?Diagnosis Date  ? Arthritis   ? Atrial fibrillation ([place])   ? Diabetes mellitus   ? DVT (deep venous thrombosis) ([place])   ? Hyperlipidemia   ? Hypertension   ? Leg pain   ? Peripheral arterial disease ([place])   ? Stroke [facility]) 1999  ? ?Past Surgical History:  ?Procedure Laterality Date  ? ABDOMINAL AORTOGRAM W/LOWER EXTREMITY N/A 07/11/2021  ? Procedure: ABDOMINAL AORTOGRAM W/LOWER EXTREMITY;  Surgeon: Broadus John, MD;  Location: [facility];  Service: Cardiovascular;  Laterality: N/A;  ? [place]  ? CARDIOVERSION N/A 02/15/2020  ? Procedure: CARDIOVERSION;  Surgeon: Thayer Headings, MD;  Location: [place];  Service: Cardiovascular;  Laterality: N/A;  ? CATARACT EXTRACTION  2000  ? CESAREAN SECTION    ? EYE SURGERY    ? FEMORAL BYPASS  Left July 23, 2010  ? LUMBAR DISC SURGERY    ? SPINE SURGERY    ? ?Family History  ?Problem Relation Age of Onset  ? Hypertension Mother   ? Heart attack Mother   ? Diabetes Father   ? Hypertension Father   ? Stroke Father   ? Stroke Sister   ? Diabetes Sister   ? Heart disease Sister   ?     Before age 26-Aneurysm  ? Dementia Sister   ? Alzheimer's disease Sister   ? Diabetes Brother   ? Diabetes Brother   ? Heart disease Brother   ?     Before age 74  ? Heart attack Brother   ? Heart attack Sister   ? Diabetes Sister   ? Alzheimer's disease Sister   ? ?Social History  ? ?Socioeconomic History  ? Marital status: Widowed  ?  Spouse name: Not on file  ? Number of children: 1  ? Years of education: Not on file  ? Highest education level: Not on file  ?Occupational History  ? Occupation: Retired  ?  Comment: retail Financial controller  ?Tobacco Use  ? Smoking status: Never  ? Smokeless tobacco: Never  ?Vaping Use  ? Vaping Use: Never used  ?Substance and Sexual Activity  ? Alcohol use: No  ? Drug use: No  ? Sexual activity: Not  on file  ?Other Topics Concern  ? Not on file  ?Social History Narrative  ? Lives alone. Has daily contact with friends. Transportation by friends and sometimes [employer].  ? ?Social Determinants of Health  ? ?Financial Resource Strain: Not on file  ?Food Insecurity: No Food Insecurity  ? Worried About Charity fundraiser in the Last Year: Never true  ? Ran Out of Food in the Last Year: Never true  ?Transportation Needs: No Transportation Needs  ? Lack of Transportation (Medical): No  ? Lack of Transportation (Non-Medical): No  ?Physical Activity: Not on file  ?Stress: Not on file  ?Social Connections: Not on file  ? ?No Known Allergies ? ?Medications  ? ?Medications Prior to Admission  ?Medication Sig Dispense Refill Last Dose  ? acetaminophen (TYLENOL) 650 MG CR tablet Take 1,300 mg by mouth every 8 (eight) hours as needed for pain.   07/24/2021  ? atorvastatin (LIPITOR) 40 MG tablet Take 1 tablet (40 mg total) by mouth daily. 90 tablet 3 07/25/2021  ? diclofenac Sodium (VOLTAREN) 1 % GEL Apply 2 g topically 2 (two) times daily as needed (pain).   Past Week  ? diltiazem (CARDIZEM CD) 240 MG 24 hr capsule Take 1 capsule (240 mg total) by mouth daily. 90 capsule 3 07/26/2021 at 0440  ? ELIQUIS 2.5 MG TABS tablet TAKE 1 TABLET BY MOUTH TWICE A DAY 180 tablet 1 07/22/2021  ? ezetimibe (ZETIA) 10 MG tablet Take 1 tablet (10 mg total) by mouth daily. 90 tablet 3 07/25/2021  ? Ginkgo Biloba 60 MG CAPS Take 60 mg by mouth 2 (two) times daily.   07/25/2021  ? glipiZIDE (GLUCOTROL) 10 MG tablet Take 10 mg by mouth 2 (two) times daily before a meal.   07/25/2021  ? LANTUS SOLOSTAR 100 UNIT/ML Solostar Pen Inject 10 Units into the skin at bedtime.   07/25/2021  ? latanoprost (XALATAN) 0.005 % ophthalmic solution Place 1 drop into both eyes daily.   07/26/2021  ? lisinopril (ZESTRIL) 20 MG tablet Take 1 tablet (20 mg total) by mouth daily. 90 tablet  3 07/25/2021  ? metFORMIN (GLUCOPHAGE) 500 MG tablet Take 1,000 mg by mouth 2 (two) times  daily.   07/25/2021  ? metoprolol tartrate (LOPRESSOR) 50 MG tablet Take 1 tablet po twice daily 14 tablet 0 07/26/2021 at 0440  ? Multiple Vitamin (MULTIVITAMIN) tablet Take 1 tablet by mouth daily. Centrum silver   Past Week  ? Omega-3 Fatty Acids (FISH OIL) 1200 MG CAPS Take 1,200 mg by mouth daily.   07/24/2021  ? ACCU-CHEK SOFTCLIX LANCETS lancets      ? GVOKE HYPOPEN 2-PACK 0.5 MG/0.1ML SOAJ Inject 1 Dose into the skin as needed (hypoglycemia).   Unknown  ? traMADol (ULTRAM) 50 MG tablet Take 1 tablet (50 mg total) by mouth every 6 (six) hours as needed. (Patient not taking: Reported on 07/06/2021) 15 tablet 0 Not Taking  ?  ? ?Vitals  ? ?Vitals:  ? 07/26/21 1043 07/26/21 1045 07/26/21 1102 07/26/21 1157  ?BP: (!) 170/76 (!) 164/60 (!) 158/95 (!) 143/61  ?Pulse: 74 70 77 62  ?Resp: 18  17 19   ?Temp: 97.6 ?F (36.4 ?C)   (!) 97.5 ?F (36.4 ?C)  ?TempSrc: Oral   Oral  ?SpO2: 95% 94% 98% 96%  ?Weight:      ?Height:      ?  ? ?Body mass index is 16.99 kg/m?. ? ?Physical Exam  ? ?General: Laying comfortably in bed; in no acute distress.  ?HENT: Normal oropharynx and mucosa. Normal external appearance of ears and nose.  ?Neck: Supple, no pain or tenderness  ?CV: No JVD. No peripheral edema.  ?Pulmonary: Symmetric Chest rise. Normal respiratory effort.  ?Abdomen: Soft to touch, non-tender.  ?Ext: No cyanosis, edema, or deformity  ?Skin: No rash. Normal palpation of skin.   ?Musculoskeletal: Normal digits and nails by inspection. No clubbing.  ? ?Neurologic Examination  ?Mental status/Cognition: Alert, oriented to self only, poor attention.  Next speech/language: non fluent, comprehension intact to simple commands, able to name some but not all objects.   ?Cranial nerves:  ? CN II Pupils equal and reactive to light, no VF deficits  ? CN III,IV,VI EOM intact, no gaze preference or deviation, no nystagmus  ? CN V normal sensation in V1, V2, and V3 segments bilaterally  ? CN VII no asymmetry, no nasolabial fold flattening  ?  CN VIII normal hearing to speech  ? CN IX & X normal palatal elevation, no uvular deviation  ? CN XI 5/5 head turn and 5/5 shoulder shrug bilaterally  ? CN XII midline tongue protrusion  ? ?Motor:  ?Muscl

## 2021-07-26 NOTE — Plan of Care (Signed)
  Problem: Clinical Measurements: Goal: Will remain free from infection Outcome: Progressing Goal: Diagnostic test results will improve Outcome: Progressing Goal: Cardiovascular complication will be avoided Outcome: Progressing   Problem: Activity: Goal: Risk for activity intolerance will decrease Outcome: Progressing   

## 2021-07-26 NOTE — Anesthesia Postprocedure Evaluation (Signed)
Anesthesia Post Note ? ?Patient: [name] ? ?Procedure(s) Performed: LEFT COMMON FEMORAL ENDARTERECTOMY (Left: Groin) ?PATCH ANGIOPLASTY WITH XENOSURE BIOLOGIC 1 cm x 6 cm PATCH (Left: Groin) ? ?  ? ?Patient location during evaluation: PACU ?Anesthesia Type: General ?Level of consciousness: awake ?Pain management: pain level controlled ?Vital Signs Assessment: post-procedure vital signs reviewed and stable ?Respiratory status: spontaneous breathing and respiratory function stable ?Cardiovascular status: stable ?Postop Assessment: no apparent nausea or vomiting ?Anesthetic complications: no ? ? ?No notable events documented. ? ?Last Vitals:  ?Vitals:  ? 07/26/21 1045 07/26/21 1102  ?BP: (!) 164/60 (!) 158/95  ?Pulse: 70 77  ?Resp:  17  ?Temp:    ?SpO2: 94% 98%  ?  ?Last Pain:  ?Vitals:  ? 07/26/21 1043  ?TempSrc: Oral  ?PainSc:   ? ? ?  ?  ?  ?  ?  ?  ? ?Mellody Dance ? ? ? ? ?

## 2021-07-27 ENCOUNTER — Encounter (HOSPITAL_COMMUNITY): Payer: Self-pay | Admitting: Vascular Surgery

## 2021-07-27 LAB — BASIC METABOLIC PANEL
Anion gap: 8 (ref 5–15)
BUN: 16 mg/dL (ref 8–23)
CO2: 27 mmol/L (ref 22–32)
Calcium: 8.8 mg/dL — ABNORMAL LOW (ref 8.9–10.3)
Chloride: 100 mmol/L (ref 98–111)
Creatinine, Ser: 0.84 mg/dL (ref 0.44–1.00)
GFR, Estimated: >60 mL/min (ref >60)
Glucose, Bld: 263 mg/dL — ABNORMAL HIGH (ref 70–99)
Potassium: 3.8 mmol/L (ref 3.5–5.1)
Sodium: 135 mmol/L (ref 135–145)

## 2021-07-27 LAB — GLUCOSE, CAPILLARY
Glucose-Capillary: 232 mg/dL — ABNORMAL HIGH (ref 70–99)
Glucose-Capillary: 194 mg/dL — ABNORMAL HIGH (ref 70–99)
Glucose-Capillary: 238 mg/dL — ABNORMAL HIGH (ref 70–99)
Glucose-Capillary: 248 mg/dL — ABNORMAL HIGH (ref 70–99)

## 2021-07-27 LAB — CBC
HCT: 34 % — ABNORMAL LOW (ref 36.0–46.0)
Hemoglobin: 11.5 g/dL — ABNORMAL LOW (ref 12.0–15.0)
MCH: 35.3 pg — ABNORMAL HIGH (ref 26.0–34.0)
MCHC: 33.8 g/dL (ref 30.0–36.0)
MCV: 104.3 fL — ABNORMAL HIGH (ref 80.0–100.0)
Platelets: 185 10*3/uL (ref 150–400)
RBC: 3.26 MIL/uL — ABNORMAL LOW (ref 3.87–5.11)
RDW: 14.1 % (ref 11.5–15.5)
WBC: 8.7 10*3/uL (ref 4.0–10.5)
nRBC: 0 % (ref 0.0–0.2)

## 2021-07-27 NOTE — Progress Notes (Addendum)
?  Progress Note ? ? ? ?07/27/2021 ?8:26 AM ?1 Day Post-Op ? ?Subjective:  feeling better this morning.  No further stroke like symptoms overnight.  Soreness L groin ? ? ?Vitals:  ? 07/27/21 0735 07/27/21 0800  ?BP: (!) 172/89 (!) 168/74  ?Pulse:  62  ?Resp:  18  ?Temp:    ?SpO2:  98%  ? ?Physical Exam: ?Cardiac: RRR ?Lungs:  non labored ?Incisions:  L groin incision c/d/i ?Extremities:  brisk L PT by doppler ?Neurologic: A&O ? ?CBC ?   ?Component Value Date/Time  ? WBC 8.7 07/27/2021 0133  ? RBC 3.26 (L) 07/27/2021 0133  ? HGB 11.5 (L) 07/27/2021 0133  ? HGB 13.9 12/04/2020 0824  ? HCT 34.0 (L) 07/27/2021 0133  ? HCT 42.4 12/04/2020 0824  ? PLT 185 07/27/2021 0133  ? PLT 224 12/04/2020 0824  ? MCV 104.3 (H) 07/27/2021 0133  ? MCV 101 (H) 12/04/2020 0824  ? MCH 35.3 (H) 07/27/2021 0133  ? MCHC 33.8 07/27/2021 0133  ? RDW 14.1 07/27/2021 0133  ? RDW 11.9 12/04/2020 0824  ? LYMPHSABS 1.0 09/10/2020 0954  ? LYMPHSABS 1.7 04/22/2019 1420  ? MONOABS 0.5 09/10/2020 0954  ? EOSABS 0.0 09/10/2020 0954  ? EOSABS 0.1 04/22/2019 1420  ? BASOSABS 0.0 09/10/2020 0954  ? BASOSABS 0.0 04/22/2019 1420  ? ? ?BMET ?   ?Component Value Date/Time  ? NA 135 07/27/2021 0133  ? NA 142 12/04/2020 0824  ? K 3.8 07/27/2021 0133  ? CL 100 07/27/2021 0133  ? CO2 27 07/27/2021 0133  ? GLUCOSE 263 (H) 07/27/2021 0133  ? BUN 16 07/27/2021 0133  ? BUN 21 12/04/2020 0824  ? CREATININE 0.84 07/27/2021 0133  ? CREATININE 0.79 03/21/2016 1116  ? CALCIUM 8.8 (L) 07/27/2021 0133  ? GFRNONAA >60 07/27/2021 0133  ? GFRAA >60 06/26/2019 0457  ? ? ?INR ?   ?Component Value Date/Time  ? INR 1.0 07/24/2021 1600  ? INR 4.3 06/06/2010 1010  ? ? ? ?Intake/Output Summary (Last 24 hours) at 07/27/2021 0826 ?Last data filed at 07/27/2021 [id number] ?Gross per 24 hour  ?Intake 887.93 ml  ?Output 1630 ml  ?Net -742.07 ml  ? ? ? ?Assessment/Plan:  88 y.o. female is s/p L CFA endarterectomy 1 Day Post-Op  ? ?Workup negative for CVA; Neurology has signed off ?LLE well perfused  based on doppler exam; L groin incision well appearing ?OOB with PT today ?Home this afternoon or tomorrow morning ? ? ?Dagoberto Ligas, PA-C ?[facility] ?[phone] ?07/27/2021 ?8:26 AM ? ?VASCULAR STAFF ADDENDUM: ?I have independently interviewed and examined the patient. ?I agree with the above.  ?Discussed discharge with family- ?Home tomorrow pending PT clearance  ? ?J. Melene Muller, MD ?[facility] ?Office Phone Number: [phone] ?07/27/2021 10:03 AM ? ? ? ?

## 2021-07-27 NOTE — TOC Initial Note (Signed)
Transition of Care (TOC) - Initial/Assessment Note  ? ? ?Patient Details  ?Name: [name] ?MRN: [id number] ?Date of Birth: 1932-12-30 ? ?Transition of Care (TOC) CM/SW Contact:    ?Deatra Robinson, LCSW ?Phone Number: ?07/27/2021, 1:47 PM ? ?Clinical Narrative: SW spoke with pt's son Kendell Bane [phone] and dtr [name] [phone] re PT/OT recommendation for SNF. Pt from home with her son who works, dtr assists as needed. Per dtr, pt with previous SNF stay [facility] ago at [facility] and they prefer this facility again. Reviewed SNF placement process and answered questions. Will begin SNF search and f/u with offers as available. [employer] auth request faxed. Anticipate dc Monday pending medical clearance.  ? ?Dellie Burns, MSW, LCSW ?[phone] (coverage) ? ?         ? ? ?Expected Discharge Plan: Skilled Nursing Facility ?Barriers to Discharge: Continued Medical Work up, English as a second language teacher, SNF Pending bed offer ? ? ?Patient Goals and CMS Choice ?  ?  ?Choice offered to / list presented to : Adult Children ? ?Expected Discharge Plan and Services ?Expected Discharge Plan: Skilled Nursing Facility ?  ?  ?Post Acute Care Choice: Skilled Nursing Facility ?Living arrangements for the past 2 months: Single Family Home ?                ?  ?  ?  ?  ?  ?  ?  ?  ?  ?  ? ?Prior Living Arrangements/Services ?Living arrangements for the past 2 months: Single Family Home ?Lives with:: Adult Children ?Patient language and need for interpreter reviewed:: No ?       ?Need for Family Participation in Patient Care: No (Comment) ?Care giver support system in place?: Yes (comment) ?  ?Criminal Activity/Legal Involvement Pertinent to Current Situation/Hospitalization: No - Comment as needed ? ?Activities of Daily Living ?  ?  ? ?Permission Sought/Granted ?Permission sought to share information with : Magazine features editor ?Permission granted to share information with : Yes, Verbal Permission Granted ?   ?   ?    ?   ? ?Emotional Assessment ?  ?  ?  ?Orientation: : Fluctuating Orientation (Suspected and/or reported [place]), Oriented to Self, Oriented to Place ?Alcohol / Substance Use: Not Applicable ?Psych Involvement: No (comment) ? ?Admission diagnosis:  Critical limb ischemia of left lower extremity (HCC) [I70.222] ?PAD (peripheral artery disease) (HCC) [I73.9] ?Patient Active Problem List  ? Diagnosis Date Noted  ? Critical limb ischemia of left lower extremity (HCC) 07/26/2021  ? PAD (peripheral artery disease) (HCC) 07/26/2021  ? Need for prophylactic antibiotic 02/14/2020  ? Atrial flutter with rapid ventricular response (HCC) 02/13/2020  ? Hypomagnesemia 02/13/2020  ? Atrial flutter (HCC) 02/12/2020  ? Cramp of both lower extremities 12/15/2014  ? Foot cramps 12/15/2014  ? Stiffness of foot 06/10/2014  ? Aftercare following surgery of the circulatory system, NEC 12/09/2013  ? Peripheral vascular disease (HCC) 10/03/2011  ? Bypass graft stenosis (HCC) 07/04/2011  ? Chronic total occlusion of artery of the extremities (HCC) 07/04/2011  ? PURE HYPERCHOLESTEROLEMIA 03/29/2009  ? ESSENTIAL HYPERTENSION, MALIGNANT 03/29/2009  ? Essential hypertension 03/28/2009  ? Type 2 diabetes mellitus with hyperlipidemia (HCC) 08/30/2008  ? HYPERLIPIDEMIA-MIXED 08/30/2008  ? ATRIAL FIBRILLATION 08/30/2008  ? SYNCOPE AND COLLAPSE 08/30/2008  ? ?PCP:  [name], MD ?Pharmacy:   ?[id number] [place], [place] - [address] ?[address] ?[place] [address] ?Phone: [phone] Fax: [phone] ? ?[facility] - [place], [place] -  [address] ?[address] ?[place] [address] ?Phone: [phone] Fax: [phone] ? ? ? ? ?Social Determinants of Health (SDOH) Interventions ?  ? ?Readmission Risk Interventions ?   ? View : No data to display.  ?  ?  ?  ? ? ? ?

## 2021-07-27 NOTE — Evaluation (Signed)
Occupational Therapy Evaluation ?Patient Details ?Name: [name] ?MRN: [id number] ?DOB: 23-Dec-1932 ?Today's Date: 07/27/2021 ? ? ?History of Present Illness 88 y.o. female who presented 07/26/21 for L CFA endarterectomy secondary to [place] 4 critical limb ischemia in the left leg. Code Stroke called after surgery when pt was disoriented and with L facial droop. MRI of head was negative with neuro signing off and indicating she would benefit from an outpatient dementia evaluation. PMH: afib, DM, DVT, HTN, HLD, PAD, CVA  ? ?Clinical Impression ?  ?PTA, pt was living alone with family that provide coverage in the evenings; son lives out of state and travels back once a month to assist. Pt was receiving assistance from family for bathing and IADLs (including medication management, meals, and cleaning). Pt currently requiring Min-Max A for ADLs and Max cues throughout. During functional mobility, pt with L lateral lean and requiring Min A and RW. Pt scoring 28/28 on the SBT indicating "Impairment Consistent with Dementia".  Discussed results with son. Pt would benefit from further acute OT to facilitate safe dc. Due to decreased balance and limited home support, recommend dc to SNF for further OT to optimize safety, independence with ADLs, and return to PLOF.  ?   ? ?Recommendations for follow up therapy are one component of a multi-disciplinary discharge planning process, led by the attending physician.  Recommendations may be updated based on patient status, additional functional criteria and insurance authorization.  ? ?Follow Up Recommendations ? Skilled nursing-short term rehab (<3 hours/day)  ?  ?Assistance Recommended at Discharge Frequent or constant Supervision/Assistance  ?Patient can return home with the following A lot of help with walking and/or transfers;A lot of help with bathing/dressing/bathroom ? ?  ?Functional Status Assessment ? Patient has had a recent decline in their functional status and  demonstrates the ability to make significant improvements in function in a reasonable and predictable amount of time.  ?Equipment Recommendations ? Other (comment) (Defer to next venue)  ?  ?Recommendations for Other Services PT consult ? ? ?  ?Precautions / Restrictions Precautions ?Precautions: Fall ?Restrictions ?Weight Bearing Restrictions: No  ? ?  ? ?Mobility Bed Mobility ?  ?  ?  ?  ?  ?  ?  ?General bed mobility comments: Sitting in recliner ?  ? ?Transfers ?Overall transfer level: Needs assistance ?Equipment used: Rolling walker (2 wheels) ?Transfers: Sit to/from Stand ?Sit to Stand: Min guard ?  ?  ?  ?  ?  ?General transfer comment: Min guard A for safety ?  ? ?  ?Balance Overall balance assessment: Needs assistance ?Sitting-balance support: No upper extremity supported, Feet supported ?Sitting balance-Leahy Scale: Fair ?  ?  ?Standing balance support: Bilateral upper extremity supported, During functional activity, Reliant on assistive device for balance ?Standing balance-Leahy Scale: Poor ?  ?  ?  ?  ?  ?  ?  ?  ?  ?  ?  ?  ?   ? ?ADL either performed or assessed with clinical judgement  ? ?ADL Overall ADL's : Needs assistance/impaired ?Eating/Feeding: Minimal assistance;Sitting ?  ?Grooming: Minimal assistance;Sitting ?  ?Upper Body Bathing: Minimal assistance ?  ?Lower Body Bathing: Moderate assistance;Sit to/from stand ?  ?Upper Body Dressing : Minimal assistance;Sitting ?  ?Lower Body Dressing: Maximal assistance;Sit to/from stand ?  ?Toilet Transfer: Minimal assistance;Ambulation;Rolling walker (2 wheels) ?  ?  ?  ?  ?  ?Functional mobility during ADLs: Minimal assistance;Rolling walker (2 wheels) ?General ADL Comments: Pt requring Max cues  throughout  ? ? ? ?Vision   ?   ?   ?Perception   ?  ?Praxis   ?  ? ?Pertinent Vitals/Pain Pain Assessment ?Pain Assessment: Faces ?Faces Pain Scale: Hurts little more ?Pain Location: L groin ?Pain Descriptors / Indicators: Discomfort, Operative site guarding,  Grimacing ?Pain Intervention(s): Monitored during session, Limited activity within patient's tolerance, Repositioned  ? ? ? ?Hand Dominance   ?  ?Extremity/Trunk Assessment Upper Extremity Assessment ?Upper Extremity Assessment: Generalized weakness ?  ?Lower Extremity Assessment ?Lower Extremity Assessment: Defer to PT evaluation ?RLE Deficits / Details: MMT scores of 3+ hip flexion, 4+ knee extension, 4 ankle dorsiflexion, appears fairly symmetrical bil; denies numbness/tingling bil ?LLE Deficits / Details: MMT scores of 3+ hip flexion, 4+ knee extension, 4 ankle dorsiflexion, appears fairly symmetrical bil; denies numbness/tingling bil ?  ?Cervical / Trunk Assessment ?Cervical / Trunk Assessment: Kyphotic ?  ?Communication Communication ?Communication: Other (comment);Expressive difficulties (soft spoken, difficulty finding words to express self at times, perseverates) ?  ?Cognition Arousal/Alertness: Awake/alert ?Behavior During Therapy: [facility] for tasks assessed/performed ?Overall Cognitive Status: History of cognitive impairments - at baseline ?  ?  ?  ?  ?  ?  ?  ?  ?  ?  ?  ?  ?  ?  ?  ?  ?General Comments: Son reporting that pt's orientation and cognition varies throughout the day and week. He has to provide orientation each morning and cues for functional tasks. Administering the SBT, and pt scoring 28/28 indicating "Impairment Consistent with Dementia". Throughout session, pt requiring increased time. ?  ?  ?General Comments  Son present throughout. BP elevated. RN aware. ? ?  ?Exercises   ?  ?Shoulder Instructions    ? ? ?Home Living Family/patient expects to be discharged to:: Private residence ?Living Arrangements: Children (adult son) ?Available Help at Discharge: Family;Available PRN/intermittently ?Type of Home: House ?Home Access: Stairs to enter ?Entrance Stairs-Number of Steps: 3 ?Entrance Stairs-Rails: Left (ascending) ?Home Layout: One level ?  ?  ?Bathroom Shower/Tub: Tub/shower unit ?  ?Bathroom  Toilet: Standard ?Bathroom Accessibility: No ?  ?Home Equipment: BSC/3in1;Rolling Walker (2 wheels);Cane - quad;Rollator (4 wheels);Shower seat;Grab bars - tub/shower ?  ?Additional Comments: Son lives in [place] and comes for a week each month ?  ? ?  ?Prior Functioning/Environment Prior Level of Function : Needs assist ?  ?  ?  ?  ?  ?  ?Mobility Comments: Pt's functional status changes often, sometimes she is able to function without AD or assistance. Sometimes she uses RW and sometimes requires some assistance for mobility. Assistance provided with tub/shower transfers. Guards pt on stairs majority of time. Son denies any falls in past 6 months. ?ADLs Comments: Does not leave house alone. Cognition varies daily and throughout day, normally takes ~45-60 min to fully wake up and be less disoriented but then cog varies during day, especially when glucose levels vary. Daughter asissts with bathing intermittently, majority of time pt can dress herself. Family manages meds and finances for pt. Does not drive. ?  ? ?  ?  ?OT Problem List: Decreased strength;Decreased range of motion;Impaired balance (sitting and/or standing);Decreased activity tolerance;Decreased knowledge of use of DME or AE;Decreased knowledge of precautions;Pain ?  ?   ?OT Treatment/Interventions: Self-care/ADL training;Therapeutic exercise;Energy conservation;DME and/or AE instruction;Therapeutic activities;Patient/family education  ?  ?OT Goals(Current goals can be found in the care plan section) Acute Rehab OT Goals ?Patient Stated Goal: Get better ?OT Goal Formulation: With patient ?Time For Goal  Achievement: 08/10/21 ?Potential to Achieve Goals: Good  ?OT Frequency: Min 2X/week ?  ? ?Co-evaluation   ?  ?  ?  ?  ? ?  ?AM-PAC OT "6 Clicks" Daily Activity     ?Outcome Measure Help from another person eating meals?: None ?Help from another person taking care of personal grooming?: A Little ?Help from another person toileting, which includes using  toliet, bedpan, or urinal?: A Lot ?Help from another person bathing (including washing, rinsing, drying)?: A Lot ?Help from another person to put on and taking off regular upper body clothing?: A Lot ?

## 2021-07-27 NOTE — Progress Notes (Signed)
Brief Neuro Update: ? ?MRI Brain negative. No further inpatient neuro workup. She would benefit from an outpatient Dementia evaluation. We will signoff. ? ?Donnetta Simpers ?[facility] ?Pager Number [phone] ?

## 2021-07-27 NOTE — [place] FL2 (Signed)
?[place] MEDICAID FL2 LEVEL OF CARE SCREENING TOOL  ?  ? ?IDENTIFICATION  ?Patient Name: ?JOSLYNN JAMROZ Birthdate: Apr 05, 1933 Sex: female Admission Date (Current Location): ?07/26/2021  ?[place] Number: ? [place] ?  Facility and Address:  ?[facility], [address] [place], [place] [address] ?     Provider Number: ?[id number]  ?Attending Physician Name and Address:  ?Victorino Sparrow, MD ? Relative Name and Phone Number:  ?  ?   ?Current Level of Care: ?Hospital Recommended Level of Care: ?Skilled Nursing Facility Prior Approval Number: ?  ? ?Date Approved/Denied: ?  PASRR Number: ?[id number] A ? ?Discharge Plan: ?SNF ?  ? ?Current Diagnoses: ?Patient Active Problem List  ? Diagnosis Date Noted  ? Critical limb ischemia of left lower extremity (HCC) 07/26/2021  ? PAD (peripheral artery disease) (HCC) 07/26/2021  ? Need for prophylactic antibiotic 02/14/2020  ? Atrial flutter with rapid ventricular response (HCC) 02/13/2020  ? Hypomagnesemia 02/13/2020  ? Atrial flutter (HCC) 02/12/2020  ? Cramp of both lower extremities 12/15/2014  ? Foot cramps 12/15/2014  ? Stiffness of foot 06/10/2014  ? Aftercare following surgery of the circulatory system, NEC 12/09/2013  ? Peripheral vascular disease (HCC) 10/03/2011  ? Bypass graft stenosis (HCC) 07/04/2011  ? Chronic total occlusion of artery of the extremities (HCC) 07/04/2011  ? PURE HYPERCHOLESTEROLEMIA 03/29/2009  ? ESSENTIAL HYPERTENSION, MALIGNANT 03/29/2009  ? Essential hypertension 03/28/2009  ? Type 2 diabetes mellitus with hyperlipidemia (HCC) 08/30/2008  ? HYPERLIPIDEMIA-MIXED 08/30/2008  ? ATRIAL FIBRILLATION 08/30/2008  ? SYNCOPE AND COLLAPSE 08/30/2008  ? ? ?Orientation RESPIRATION BLADDER Height & Weight   ?  ?Self, Place (fluctuating orientation) ?   Indwelling catheter Weight: 84 lb 1.6 oz (38.1 kg) ?Height:  4\' 11"  (149.9 cm)  ?BEHAVIORAL SYMPTOMS/MOOD NEUROLOGICAL BOWEL NUTRITION STATUS  ?    Continent     ?AMBULATORY STATUS COMMUNICATION OF NEEDS Skin   ?Limited Assist Verbally Surgical wounds ?  ?  ?  ?    ?     ?     ? ? ?Personal Care Assistance Level of Assistance  ?Bathing, Dressing, Feeding Bathing Assistance: Limited assistance ?Feeding assistance: Limited assistance ?Dressing Assistance: Limited assistance ?   ? ?Functional Limitations Info  ?Sight, Hearing, Speech Sight Info: Impaired ?Hearing Info: Adequate ?Speech Info: Adequate  ? ? ?SPECIAL CARE FACTORS FREQUENCY  ?PT (By licensed PT), OT (By licensed OT)   ?  ?  ?  ?  ?  ?  ?   ? ? ?Contractures Contractures Info: Not present  ? ? ?Additional Factors Info  ?Code Status Code Status Info: FULL CODE ?  ?  ?  ?  ?   ? ?Current Medications (07/27/2021):  This is the current hospital active medication list ?Current Facility-Administered Medications  ?Medication Dose Route Frequency Provider Last Rate Last Admin  ? 0.9 %  sodium chloride infusion  500 mL Intravenous Once PRN 07/29/2021, PA-C      ? acetaminophen (TYLENOL) tablet 325-650 mg  325-650 mg Oral Q4H PRN Emilie Rutter, PA-C      ? Or  ? acetaminophen (TYLENOL) suppository 325-650 mg  325-650 mg Rectal Q4H PRN Emilie Rutter, PA-C      ? alum & mag hydroxide-simeth (MAALOX/MYLANTA) 200-200-20 MG/5ML suspension 15-30 mL  15-30 mL Oral Q2H PRN 08-06-1974, PA-C      ? atorvastatin (LIPITOR) tablet 40 mg  40 mg Oral Daily Emilie Rutter, PA-C   40 mg  at 07/26/21 1830  ? diltiazem (CARDIZEM CD) 24 hr capsule 240 mg  240 mg Oral Daily Emilie Rutter, PA-C   240 mg at 07/27/21 1761  ? docusate sodium (COLACE) capsule 100 mg  100 mg Oral Daily Emilie Rutter, PA-C   100 mg at 07/27/21 6073  ? ezetimibe (ZETIA) tablet 10 mg  10 mg Oral Daily Emilie Rutter, PA-C   10 mg at 07/27/21 7106  ? glipiZIDE (GLUCOTROL) tablet 10 mg  10 mg Oral BID AC Emilie Rutter, PA-C   10 mg at 07/27/21 0809  ? guaiFENesin-dextromethorphan (ROBITUSSIN DM) 100-10 MG/5ML syrup 15 mL  15 mL Oral Q4H PRN  Emilie Rutter, PA-C      ? heparin injection 5,000 Units  5,000 Units Subcutaneous Q8H Emilie Rutter, PA-C   5,000 Units at 07/27/21 2694  ? hydrALAZINE (APRESOLINE) injection 5 mg  5 mg Intravenous Q20 Min PRN Emilie Rutter, PA-C      ? insulin aspart (novoLOG) injection 0-9 Units  0-9 Units Subcutaneous TID WC Emilie Rutter, PA-C   2 Units at 07/27/21 1225  ? insulin glargine-yfgn (SEMGLEE) injection 10 Units  10 Units Subcutaneous QHS Emilie Rutter, PA-C   10 Units at 07/26/21 2237  ? labetalol (NORMODYNE) injection 10 mg  10 mg Intravenous Q10 min PRN Emilie Rutter, PA-C   10 mg at 07/27/21 1238  ? [START ON 07/28/2021] lisinopril (ZESTRIL) tablet 20 mg  20 mg Oral Daily Emilie Rutter, PA-C      ? magnesium sulfate IVPB 2 g 50 mL  2 g Intravenous Daily PRN Emilie Rutter, PA-C      ? metoprolol tartrate (LOPRESSOR) injection 2-5 mg  2-5 mg Intravenous Q2H PRN Emilie Rutter, PA-C      ? metoprolol tartrate (LOPRESSOR) tablet 50 mg  50 mg Oral BID Emilie Rutter, PA-C   50 mg at 07/27/21 8546  ? morphine (PF) 2 MG/ML injection 2 mg  2 mg Intravenous Q2H PRN Emilie Rutter, PA-C      ? ondansetron (ZOFRAN) injection 4 mg  4 mg Intravenous Q6H PRN Emilie Rutter, PA-C      ? oxyCODONE-acetaminophen (PERCOCET/ROXICET) 5-325 MG per tablet 1-2 tablet  1-2 tablet Oral Q4H PRN Emilie Rutter, PA-C   1 tablet at 07/27/21 1339  ? pantoprazole (PROTONIX) EC tablet 40 mg  40 mg Oral Daily Emilie Rutter, PA-C   40 mg at 07/27/21 2703  ? phenol (CHLORASEPTIC) mouth spray 1 spray  1 spray Mouth/Throat PRN Emilie Rutter, PA-C      ? potassium chloride SA (KLOR-CON M) CR tablet 20-40 mEq  20-40 mEq Oral Daily PRN Emilie Rutter, PA-C      ? ? ? ?Discharge Medications: ?Please see discharge summary for a list of discharge medications. ? ?Relevant Imaging Results: ? ?Relevant Lab Results: ? ? ?Additional Information ?SS# [id number] ? ?[place], [place] ? ? ? ? ?

## 2021-07-27 NOTE — Consult Note (Signed)
? ?  [facility] CM Inpatient Consult ? ? ?07/27/2021 ? ?Elwin Sleight ?12/24/1932 ?[id number] ? ?[place] [facility] [ACO] Patient: [employer] ? ?Primary Care Provider:  Lucianne Lei, MD ?[facility] is Embedded with Pharmacist ? ?Patient is currently active with [facility] for chronic disease management services.  Patient has been engaged by a [place].Marland Kitchen  Our community based plan of care has focused on disease management and community resource support.   ? ?Plan: Follow for progress and disposition needs for post hospital care needs and alert [facility] RNCM of any additional needs. ? ?Of note, [facility] Care Management services does not replace or interfere with any services that are needed or arranged by inpatient [facility] care management team.  For additional questions or referrals please contact: ? ?Natividad Brood, RN BSN CCM ?[facility] Liaison ? [phone] business mobile phone ?Toll free office [phone]  ?Fax number: [phone] ?[email or web address] ? ? ? ? ?

## 2021-07-27 NOTE — Progress Notes (Signed)
OT Cancellation Note ? ?Patient Details ?Name: [name] ?MRN: [id number] ?DOB: 1932-05-05 ? ? ?Cancelled Treatment:    Reason Eval/Treat Not Completed: Other (comment) (Pt eating lunch. Will return as schedule allows.) ? ?[name] M Wladyslaw Henrichs ?Denaisha Swango MSOT, OTR/L ?Acute Rehab ?Pager: [phone] ?Office: [phone] ?07/27/2021, 11:50 AM ?

## 2021-07-27 NOTE — Progress Notes (Signed)
PT Cancellation Note ? ?Patient Details ?Name: [name] ?MRN: [id number] ?DOB: 1932-07-13 ? ? ?Cancelled Treatment:    Reason Eval/Treat Not Completed: Fatigue/lethargy limiting ability to participate. Pt sleeping upon arrival with pt's son reporting pt had not slept much last night. Will plan to return later today. ? ?Raymond Gurney, PT, DPT ?Acute Rehabilitation Services  ?Pager: [phone] ?Office: [phone] ? ? ? ?Henrene Dodge Pettis ?07/27/2021, 9:33 AM ? ? ?

## 2021-07-27 NOTE — Evaluation (Signed)
Physical Therapy Evaluation ?Patient Details ?Name: [name] ?MRN: [id number] ?DOB: 09-10-1932 ?Today's Date: 07/27/2021 ? ?History of Present Illness ? Pt is an 88 y.o. female who presented 07/26/21 for L CFA endarterectomy secondary to [place] 4 critical limb ischemia in the left leg. Code Stroke called after surgery when pt was disoriented and with L facial droop. MRI of head was negative with neuro signing off and indicating she would benefit from an outpatient dementia evaluation. PMH: afib, DM, DVT, HTN, HLD, PAD, CVA ?  ?Clinical Impression ? Pt presents with condition above and deficits mentioned below, see PT Problem List. PTA, she was living with her son in a 1-level house with 3 STE. Pt's son reports that the majority of the time, pt was mod I for mobility, normally not using her RW. He denies any falls in the last 6 months. However, when pt's cognition would vary and decline she needed increased assistance for mobility and ADLs. At baseline, her son and daughter manage her meds and finances and sometimes assist with bathing and tub/shower transfers. Pt's son reports pt tends to require > 45 min to wake up and needs reminders to be re-oriented. Her cognition is normally worse in the mornings and varies during the day, especially when her glucose levels are high or low, per pt's son. Pt's son works and they do not currently have anyone else to assist during the day but are actively working on finding someone to assist. Currently, pt believes she is at her home and displays significant deficits in attention, memory, sequencing, and problem-solving, often perseverating during session. Pt also with a L lateral lean in standing and sitting, but displays fairly symmetrical strength in bil lower extremities with MMT. She is requiring minA for functional mobility using a RW and needing continual guidance of the RW for mobility. She displays deficits in functional strength, balance, activity tolerance, and  cognition that place her at high risk for falls and injuries. She would be unsafe to be home alone. If continual care could be arranged at home then she could go home with HHPT. However, if not, recommend short-term rehab at a SNF. Discussed ALF options with son also. Will continue to follow acutely. ?   ? ?Recommendations for follow up therapy are one component of a multi-disciplinary discharge planning process, led by the attending physician.  Recommendations may be updated based on patient status, additional functional criteria and insurance authorization. ? ?Follow Up Recommendations Skilled nursing-short term rehab (<3 hours/day) (if can get constant care at home then [facility]) ? ?  ?Assistance Recommended at Discharge Frequent or constant Supervision/Assistance  ?Patient can return home with the following ? A little help with walking and/or transfers;A little help with bathing/dressing/bathroom;Assistance with cooking/housework;Direct supervision/assist for medications management;Direct supervision/assist for financial management;Assist for transportation;Help with stairs or ramp for entrance ? ?  ?Equipment Recommendations None recommended by PT  ?Recommendations for Other Services ?    ?  ?Functional Status Assessment Patient has had a recent decline in their functional status and demonstrates the ability to make significant improvements in function in a reasonable and predictable amount of time.  ? ?  ?Precautions / Restrictions Precautions ?Precautions: Fall ?Restrictions ?Weight Bearing Restrictions: No  ? ?  ? ?Mobility ? Bed Mobility ?Overal bed mobility: Needs Assistance ?Bed Mobility: Supine to Sit ?  ?  ?Supine to sit: Min assist, HOB elevated ?  ?  ?General bed mobility comments: Extra time and repeated cues to direct pt to R  EOB, needing minA to manage legs due to L leg pain and to manage trunk up to sit. ?  ? ?Transfers ?Overall transfer level: Needs assistance ?Equipment used: Rolling walker (2  wheels) ?Transfers: Sit to/from Stand ?Sit to Stand: Min assist ?  ?  ?  ?  ?  ?General transfer comment: MinA to power up to stand and steady with transfer to stand from EOB to RW. ?  ? ?Ambulation/Gait ?Ambulation/Gait assistance: Min assist ?Gait Distance (Feet): 50 Feet ?Assistive device: Rolling walker (2 wheels) ?Gait Pattern/deviations: Step-to pattern, Decreased step length - right, Decreased step length - left, Decreased stride length, Shuffle, Trunk flexed, Drifts right/left ?Gait velocity: reduced ?Gait velocity interpretation: <1.31 ft/sec, indicative of household ambulator ?  ?General Gait Details: Pt with L lateral trunk flexion and lean with tendency to drift to the L inside the RW as pt fatigued. Pt with slow, shuffling steps needing minA for stability and to direct RW to advance pt. ? ?Stairs ?  ?  ?  ?  ?  ? ?Wheelchair Mobility ?  ? ?Modified Rankin (Stroke Patients Only) ?Modified Rankin (Stroke Patients Only) ?Pre-Morbid Rankin Score: Moderate disability ?Modified Rankin: Moderately severe disability ? ?  ? ?Balance Overall balance assessment: Needs assistance ?Sitting-balance support: No upper extremity supported, Feet supported ?Sitting balance-Leahy Scale: Fair ?Sitting balance - Comments: Static sitting EOB with min guard, lkeans posteriorly with dynamic tasks. ?Postural control: Posterior lean, Left lateral lean ?Standing balance support: Bilateral upper extremity supported, During functional activity, Reliant on assistive device for balance ?Standing balance-Leahy Scale: Poor ?Standing balance comment: L lateral lean, reliant on RW and up to minA ?  ?  ?  ?  ?  ?  ?  ?  ?  ?  ?  ?   ? ? ? ?Pertinent Vitals/Pain Pain Assessment ?Pain Assessment: Faces ?Faces Pain Scale: Hurts little more ?Pain Location: L groin ?Pain Descriptors / Indicators: Discomfort, Operative site guarding, Grimacing ?Pain Intervention(s): Limited activity within patient's tolerance, Monitored during session,  Repositioned  ? ? ?Home Living Family/patient expects to be discharged to:: Private residence ?Living Arrangements: Children (adult son) ?Available Help at Discharge: Family;Available PRN/intermittently ?Type of Home: House ?Home Access: Stairs to enter ?Entrance Stairs-Rails: Left (ascending) ?Entrance Stairs-Number of Steps: 3 ?  ?Home Layout: One level ?Home Equipment: BSC/3in1;Rolling Walker (2 wheels);Cane - quad;Rollator (4 wheels);Shower seat;Grab bars - tub/shower ?   ?  ?Prior Function Prior Level of Function : Needs assist ?  ?  ?  ?  ?  ?  ?Mobility Comments: Pt's functional status changes often, sometimes she is able to function without AD or assistance. Sometimes she uses RW and sometimes requires some assistance for mobility. Assistance provided with tub/shower transfers. Guards pt on stairs majority of time. Son denies any falls in past 6 months. ?ADLs Comments: Does not leave house alone. Cognition varies daily and throughout day, normally takes ~45-60 min to fully wake up and be less disoriented but then cog varies during day, especially when glucose levels vary. Daughter asissts with bathing intermittently, majority of time pt can dress herself. Family manages meds and finances for pt. Does not drive. ?  ? ? ?Hand Dominance  ?   ? ?  ?Extremity/Trunk Assessment  ? Upper Extremity Assessment ?Upper Extremity Assessment: Defer to OT evaluation ?  ? ?Lower Extremity Assessment ?Lower Extremity Assessment: RLE deficits/detail;LLE deficits/detail ?RLE Deficits / Details: MMT scores of 3+ hip flexion, 4+ knee extension, 4 ankle dorsiflexion, appears fairly symmetrical  bil; denies numbness/tingling bil ?LLE Deficits / Details: MMT scores of 3+ hip flexion, 4+ knee extension, 4 ankle dorsiflexion, appears fairly symmetrical bil; denies numbness/tingling bil ?  ? ?Cervical / Trunk Assessment ?Cervical / Trunk Assessment: Kyphotic  ?Communication  ? Communication: Other (comment);Expressive difficulties  (soft spoken, difficulty finding words to express self at times, perseverates)  ?Cognition Arousal/Alertness: Awake/alert ?Behavior During Therapy: [facility] for tasks assessed/performed ?Overall Cognitive Status: History o

## 2021-07-28 DIAGNOSIS — I70222 Atherosclerosis of native arteries of extremities with rest pain, left leg: Secondary | ICD-10-CM | POA: Diagnosis not present

## 2021-07-28 LAB — URINALYSIS, ROUTINE W REFLEX MICROSCOPIC
Bilirubin Urine: NEGATIVE
Glucose, UA: NEGATIVE mg/dL
Hgb urine dipstick: NEGATIVE
Ketones, ur: NEGATIVE mg/dL
Leukocytes,Ua: NEGATIVE
Nitrite: NEGATIVE
Protein, ur: NEGATIVE mg/dL
Specific Gravity, Urine: 1.003 — ABNORMAL LOW (ref 1.005–1.030)
pH: 9 — ABNORMAL HIGH (ref 5.0–8.0)

## 2021-07-28 LAB — GLUCOSE, CAPILLARY
Glucose-Capillary: 171 mg/dL — ABNORMAL HIGH (ref 70–99)
Glucose-Capillary: 173 mg/dL — ABNORMAL HIGH (ref 70–99)
Glucose-Capillary: 158 mg/dL — ABNORMAL HIGH (ref 70–99)
Glucose-Capillary: 262 mg/dL — ABNORMAL HIGH (ref 70–99)

## 2021-07-28 MED ORDER — APIXABAN 2.5 MG PO TABS
2.5000 mg | ORAL_TABLET | Freq: Two times a day (BID) | ORAL | Status: DC
  Administered 2021-07-28 – 2021-08-08 (×23): 2.5 mg via ORAL
  Filled 2021-07-28 (×23): qty 1

## 2021-07-28 MED ORDER — DILTIAZEM HCL-DEXTROSE 125-5 MG/125ML-% IV SOLN (PREMIX)
5.0000 mg/h | INTRAVENOUS | Status: DC
  Administered 2021-07-28: 5 mg/h via INTRAVENOUS
  Administered 2021-07-29: 10 mg/h via INTRAVENOUS
  Filled 2021-07-28 (×2): qty 125

## 2021-07-28 MED ORDER — METOPROLOL TARTRATE 50 MG PO TABS
50.0000 mg | ORAL_TABLET | Freq: Two times a day (BID) | ORAL | Status: DC
Start: 2021-07-29 — End: 2021-08-04
  Administered 2021-07-29 – 2021-08-04 (×13): 50 mg via ORAL
  Filled 2021-07-28 (×14): qty 1

## 2021-07-28 NOTE — Progress Notes (Signed)
VASCULAR SURGERY: ? ?Called to see patient because her heart rate went up into the 150s.  Her EKG shows supraventricular tachycardia with possible septal infarct age-indeterminate.  She denies chest pain but does admit to some shortness of breath.  I have spoken with Dr. Harl Bowie from Cardiology and he will evaluate the patient. ? ?Pam Gallop, MD ?4:53 PM ? ?

## 2021-07-28 NOTE — Plan of Care (Signed)
  Problem: Education: Goal: Knowledge of General Education information will improve Description Including pain rating scale, medication(s)/side effects and non-pharmacologic comfort measures Outcome: Progressing   

## 2021-07-28 NOTE — Progress Notes (Signed)
Cardiology on call paged for HR of 155 with A-Flutter after additional dose of 2.5mg  Metoprolol prn. Pt has received 7.5 mg  of Metoprolol IV since 1535. Cardiology said that they would order Diltiazem if pt is unresponsive to Metoprolol.  ? ?[name] M ?  ?

## 2021-07-28 NOTE — Consult Note (Signed)
?Cardiology Consultation:  ? ?Patient ID: [name] ?MRN: [id number]; DOB: 12/11/1932 ? ?Admit date: 07/26/2021 ?Date of Consult: 07/28/2021 ? ?PCP:  Bland, Veita, MD ?  ?[facility] Providers ?Cardiologist:  Brian Crenshaw, MD      ? ? ?Patient Profile:  ? ?[name] is a 88 y.o. female with a hx of afibb on eliquis, DM2, DVTs, HTN, HLD, Peripheral arterial disease who is being seen 07/28/2021 for the evaluation of Afib RVR at the request of Dr. Robins.  ? ?History of Present Illness:  ? ?[name] is a 88 y.o. female with a hx of afibb on eliquis, DM2, DVTs, HTN, HLD, Peripheral arterial disease who is being seen 07/28/2021 for the evaluation of Afib RVR at the request of Dr. Robins. She was admitted with ischemic L leg and underwent L femoral endarterectomy. Her eliquis was held for 2 days in preparation for the surgery. Now it has been restarted. She was evaluated for possible stroke by neurology but the work was negative. Now she is in Afib RV/Aflutter  hence cardiology was consulted. She denies any CP or SOB currently. Does not feel any palpitations. She has already received 7.5 mg of IV metoprolol but her HR remains in 150s. She also got diltiazem 240 in AM. She complains of pain while urinating.  ? ? ?Past Medical History:  ?Diagnosis Date  ? Arthritis   ? Atrial fibrillation (HCC)   ? Diabetes mellitus   ? DVT (deep venous thrombosis) (HCC)   ? Hyperlipidemia   ? Hypertension   ? Leg pain   ? Peripheral arterial disease (HCC)   ? Stroke (HCC) 1999  ? ? ?Past Surgical History:  ?Procedure Laterality Date  ? ABDOMINAL AORTOGRAM W/LOWER EXTREMITY N/A 07/11/2021  ? Procedure: ABDOMINAL AORTOGRAM W/LOWER EXTREMITY;  Surgeon: Robins, Joshua E, MD;  Location: [facility];  Service: Cardiovascular;  Laterality: N/A;  ? BACK SURGERY  1996  ? CARDIOVERSION N/A 02/15/2020  ? Procedure: CARDIOVERSION;  Surgeon: Nahser, Philip J, MD;  Location: [facility] ENDOSCOPY;  Service: Cardiovascular;  Laterality: N/A;   ? CATARACT EXTRACTION  2000  ? CESAREAN SECTION    ? ENDARTERECTOMY FEMORAL Left 07/26/2021  ? Procedure: LEFT COMMON FEMORAL ENDARTERECTOMY;  Surgeon: Robins, Joshua E, MD;  Location: [facility] OR;  Service: Vascular;  Laterality: Left;  ? EYE SURGERY    ? FEMORAL BYPASS Left July 23, 2010  ? LUMBAR DISC SURGERY    ? PATCH ANGIOPLASTY Left 07/26/2021  ? Procedure: PATCH ANGIOPLASTY WITH XENOSURE BIOLOGIC 1 cm x 6 cm PATCH;  Surgeon: Robins, Joshua E, MD;  Location: [facility] OR;  Service: Vascular;  Laterality: Left;  ? SPINE SURGERY    ?  ? ? ? ?Inpatient Medications: ?Scheduled Meds: ? apixaban  2.5 mg Oral BID  ? atorvastatin  40 mg Oral Daily  ? diltiazem  240 mg Oral Daily  ? docusate sodium  100 mg Oral Daily  ? ezetimibe  10 mg Oral Daily  ? glipiZIDE  10 mg Oral BID AC  ? insulin aspart  0-9 Units Subcutaneous TID WC  ? insulin glargine-yfgn  10 Units Subcutaneous QHS  ? lisinopril  20 mg Oral Daily  ? metoprolol tartrate  50 mg Oral BID  ? pantoprazole  40 mg Oral Daily  ? ?Continuous Infusions: ? sodium chloride    ? magnesium sulfate bolus IVPB    ? ?PRN Meds: ?sodium chloride, acetaminophen **OR** acetaminophen, alum & mag hydroxide-simeth, guaiFENesin-dextromethorphan, hydrALAZINE, magnesium sulfate bolus   IVPB, morphine injection, ondansetron, oxyCODONE-acetaminophen, phenol, potassium chloride ? ?Allergies:   No Known Allergies ? ?Social History:   ?Social History  ? ?Socioeconomic History  ? Marital status: Widowed  ?  Spouse name: Not on file  ? Number of children: 1  ? Years of education: Not on file  ? Highest education level: Not on file  ?Occupational History  ? Occupation: Retired  ?  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 Smoking status: Never  ? Smokeless tobacco: Never  ?Vaping Use  ? Vaping Use: Never used  ?Substance and Sexual Activity  ? Alcohol use: No  ? Drug use: No  ? Sexual activity: Not on file  ?Other Topics Concern  ? Not on file  ?Social History Narrative  ?  Lives alone. Has daily contact with friends. Transportation by friends and sometimes [employer].  ? ?Social Determinants of Health  ? ?Financial Resource Strain: Not on file  ?Food Insecurity: No Food Insecurity  ? Worried About Running Out of Food in the Last Year: Never true  ? Ran Out of Food in the Last Year: Never true  ?Transportation Needs: No Transportation Needs  ? Lack of Transportation (Medical): No  ? Lack of Transportation (Non-Medical): No  ?Physical Activity: Not on file  ?Stress: Not on file  ?Social Connections: Not on file  ?Intimate Partner Violence: Not on file  ?  ?Family History:   ? ?Family History  ?Problem Relation Age of Onset  ? Hypertension Mother   ? Heart attack Mother   ? Diabetes Father   ? Hypertension Father   ? Stroke Father   ? Stroke Sister   ? Diabetes Sister   ? Heart disease Sister   ?     Before age 60-Aneurysm  ? Dementia Sister   ? Alzheimer's disease Sister   ? Diabetes Brother   ? Diabetes Brother   ? Heart disease Brother   ?     Before age 60  ? Heart attack Brother   ? Heart attack Sister   ? Diabetes Sister   ? Alzheimer's disease Sister   ?  ? ?ROS:  ?Please see the history of present illness.  ?All other ROS reviewed and negative.    ? ?Physical Exam/Data:  ? ?Vitals:  ? 07/28/21 0300 07/28/21 0646 07/28/21 0800 07/28/21 1140  ?BP: (!) 145/76 (!) 177/67 (!) 141/55 126/66  ?Pulse: 69 74 78 66  ?Resp: 17 13 13 14  ?Temp: 97.9 ?F (36.6 ?C)  97.7 ?F (36.5 ?C) 97.6 ?F (36.4 ?C)  ?TempSrc: Oral  Oral Oral  ?SpO2: 98%  97% 95%  ?Weight:      ?Height:      ? ? ?Intake/Output Summary (Last 24 hours) at 07/28/2021 1753 ?Last data filed at 07/28/2021 1732 ?Gross per 24 hour  ?Intake --  ?Output 1200 ml  ?Net -1200 ml  ? ? ?  07/26/2021  ?  6:09 AM 07/24/2021  ?  3:11 PM 07/11/2021  ?  6:55 AM  ?Last 3 Weights  ?Weight (lbs) 84 lb 1.6 oz 84 lb 1.6 oz 82 lb  ?Weight (kg) 38.148 kg 38.148 kg 37.195 kg  ?   ?Body mass index is 16.99 kg/m?.  ?General:  Frail; in pain ?HEENT: normal ?Neck:  no JVD ?Vascular: No carotid bruits; Distal pulses 2+ bilaterally ?Cardiac:  normal S1, S2; tachycardic ?Lungs: Poor airflow ?Abd: soft, nontender, no hepatomegaly  ?Ext: no edema ?Musculoskeletal:  No deformities, BUE   and BLE strength normal and equal ?Skin: warm and dry  ?Neuro:  CNs 2-12 intact, no focal abnormalities noted ?Psych:  Normal affect  ? ?EKG:  The EKG was personally reviewed and demonstrates:  Aflutter RVR ? ? ?Relevant CV Studies: ?Echo from 2021 prior showed normal EF ? ?Laboratory Data: ? ?High Sensitivity Troponin:  No results for input(s): TROPONINIHS in the last 720 hours.   ?Chemistry ?Recent Labs  ?Lab 07/24/21 ?1600 07/26/21 ?1131 07/27/21 ?0133  ?NA 139  --  135  ?K 4.1  --  3.8  ?CL 101  --  100  ?CO2 24  --  27  ?GLUCOSE 226*  --  263*  ?BUN 13  --  16  ?CREATININE 0.84 0.72 0.84  ?CALCIUM 9.9  --  8.8*  ?GFRNONAA >60 >60 >60  ?ANIONGAP 14  --  8  ?  ?Recent Labs  ?Lab 07/24/21 ?1600  ?PROT 7.7  ?ALBUMIN 4.6  ?AST 39  ?ALT 45*  ?ALKPHOS 65  ?BILITOT 0.7  ? ?Lipids No results for input(s): CHOL, TRIG, HDL, LABVLDL, LDLCALC, CHOLHDL in the last 168 hours.  ?Hematology ?Recent Labs  ?Lab 07/24/21 ?1600 07/26/21 ?1131 07/27/21 ?0133  ?WBC 6.4 9.2 8.7  ?RBC 3.77* 3.37* 3.26*  ?HGB 13.3 11.9* 11.5*  ?HCT 40.2 35.4* 34.0*  ?MCV 106.6* 105.0* 104.3*  ?MCH 35.3* 35.3* 35.3*  ?MCHC 33.1 33.6 33.8  ?RDW 14.6 14.2 14.1  ?PLT 219 174 185  ? ?Thyroid No results for input(s): TSH, FREET4 in the last 168 hours.  ?BNPNo results for input(s): BNP, PROBNP in the last 168 hours.  ?DDimer No results for input(s): DDIMER in the last 168 hours. ? ? ?Radiology/Studies:  ?MR BRAIN WO CONTRAST ? ?Result Date: 07/26/2021 ?CLINICAL DATA:  Initial evaluation for neuro deficit, stroke suspected. EXAM: MRI HEAD WITHOUT CONTRAST TECHNIQUE: Multiplanar, multiecho pulse sequences of the brain and surrounding structures were obtained without intravenous contrast. COMPARISON:  Prior CT from earlier the same day. FINDINGS:  Brain: Cerebral volume within normal limits. Confluent T2/FLAIR hyperintensity involving the periventricular and deep white matter of both cerebral hemispheres as well as the pons, moderately advanced in na

## 2021-07-28 NOTE — Progress Notes (Addendum)
?  Progress Note ? ? ? ?07/28/2021 ?7:07 AM ?2 Days Post-Op ? ?Subjective:  sleeping and wakes easily.  No complaints ? ?Afebrile ?HR 60's-80's ?A999333 systolic ?A999333 RA ? ? ?Vitals:  ? 07/28/21 0300 07/28/21 0646  ?BP: (!) 145/76 (!) 177/67  ?Pulse: 69 74  ?Resp: 17 13  ?Temp: 97.9 ?F (36.6 ?C)   ?SpO2: 98%   ? ? ?Physical Exam: ?Cardiac:  irregular ?Lungs:  non labored ?Incisions:  left groin incision looks good and is clean and dry ?Extremities:  brisk left PT pulse.  Left foot is warm and well perfused. ?Abdomen:  soft ? ?CBC ?   ?Component Value Date/Time  ? WBC 8.7 07/27/2021 0133  ? RBC 3.26 (L) 07/27/2021 0133  ? HGB 11.5 (L) 07/27/2021 0133  ? HGB 13.9 12/04/2020 0824  ? HCT 34.0 (L) 07/27/2021 0133  ? HCT 42.4 12/04/2020 0824  ? PLT 185 07/27/2021 0133  ? PLT 224 12/04/2020 0824  ? MCV 104.3 (H) 07/27/2021 0133  ? MCV 101 (H) 12/04/2020 0824  ? MCH 35.3 (H) 07/27/2021 0133  ? MCHC 33.8 07/27/2021 0133  ? RDW 14.1 07/27/2021 0133  ? RDW 11.9 12/04/2020 0824  ? LYMPHSABS 1.0 09/10/2020 0954  ? LYMPHSABS 1.7 04/22/2019 1420  ? MONOABS 0.5 09/10/2020 0954  ? EOSABS 0.0 09/10/2020 0954  ? EOSABS 0.1 04/22/2019 1420  ? BASOSABS 0.0 09/10/2020 0954  ? BASOSABS 0.0 04/22/2019 1420  ? ? ?BMET ?   ?Component Value Date/Time  ? NA 135 07/27/2021 0133  ? NA 142 12/04/2020 0824  ? K 3.8 07/27/2021 0133  ? CL 100 07/27/2021 0133  ? CO2 27 07/27/2021 0133  ? GLUCOSE 263 (H) 07/27/2021 0133  ? BUN 16 07/27/2021 0133  ? BUN 21 12/04/2020 0824  ? CREATININE 0.84 07/27/2021 0133  ? CREATININE 0.79 03/21/2016 1116  ? CALCIUM 8.8 (L) 07/27/2021 0133  ? GFRNONAA >60 07/27/2021 0133  ? GFRAA >60 06/26/2019 0457  ? ? ?INR ?   ?Component Value Date/Time  ? INR 1.0 07/24/2021 1600  ? INR 4.3 06/06/2010 1010  ? ? ? ?Intake/Output Summary (Last 24 hours) at 07/28/2021 0707 ?Last data filed at 07/27/2021 2300 ?Gross per 24 hour  ?Intake 480 ml  ?Output 950 ml  ?Net -470 ml  ? ? ? ?Assessment/Plan:  88 y.o. female is s/p:  ?L CFA  endarterectomy   ?2 Days Post-Op ? ? ?-pt doing well this morning with brisk left PT doppler signal.  Incision looks fine.  ?-PT recommending SNF and social work is working on this.   ?-OOB today and mobilize ?-DVT prophylaxis:  sq heparin.  Pt on Eliquis at home for afib and also has hx of DVT and will discontinue sq heparin.  Will need to restart this morning.   ? ? ?Leontine Locket, PA-C ?[facility] ?[phone] ?07/28/2021 ?7:07 AM ? ?I have interviewed the patient and examined the patient. I agree with the findings by the PA.  Her left foot is warm.  Her left groin incision looks good.  We have restarted her Eliquis today for A-fib.  She is fairly debilitated and I would agree with physical therapy that she would likely benefit from a temporary skilled nursing facility stay for therapy. ? ?Gae Gallop, MD  ? ?

## 2021-07-28 NOTE — H&P (View-Only) (Signed)
?Cardiology Consultation:  ? ?Patient ID: [name] ?MRN: [id number]; DOB: 1932-09-30 ? ?Admit date: 07/26/2021 ?Date of Consult: 07/28/2021 ? ?PCP:  Lucianne Lei, MD ?  ?[facility] Providers ?Cardiologist:  Kirk Ruths, MD      ? ? ?Patient Profile:  ? ?[name] is a 88 y.o. female with a hx of afibb on eliquis, DM2, DVTs, HTN, HLD, Peripheral arterial disease who is being seen 07/28/2021 for the evaluation of Afib RVR at the request of Dr. Virl Cagey.  ? ?History of Present Illness:  ? ?[name] is a 88 y.o. female with a hx of afibb on eliquis, DM2, DVTs, HTN, HLD, Peripheral arterial disease who is being seen 07/28/2021 for the evaluation of Afib RVR at the request of Dr. Virl Cagey. She was admitted with ischemic L leg and underwent L femoral endarterectomy. Her eliquis was held for 2 days in preparation for the surgery. Now it has been restarted. She was evaluated for possible stroke by neurology but the work was negative. Now she is in Afib RV/Aflutter  hence cardiology was consulted. She denies any CP or SOB currently. Does not feel any palpitations. She has already received 7.5 mg of IV metoprolol but her HR remains in 150s. She also got diltiazem 240 in AM. She complains of pain while urinating.  ? ? ?Past Medical History:  ?Diagnosis Date  ? Arthritis   ? Atrial fibrillation ([place])   ? Diabetes mellitus   ? DVT (deep venous thrombosis) ([place])   ? Hyperlipidemia   ? Hypertension   ? Leg pain   ? Peripheral arterial disease ([place])   ? Stroke [facility]) 1999  ? ? ?Past Surgical History:  ?Procedure Laterality Date  ? ABDOMINAL AORTOGRAM W/LOWER EXTREMITY N/A 07/11/2021  ? Procedure: ABDOMINAL AORTOGRAM W/LOWER EXTREMITY;  Surgeon: Broadus John, MD;  Location: [place] [facility];  Service: Cardiovascular;  Laterality: N/A;  ? [place]  ? CARDIOVERSION N/A 02/15/2020  ? Procedure: CARDIOVERSION;  Surgeon: Thayer Headings, MD;  Location: [place];  Service: Cardiovascular;  Laterality: N/A;   ? CATARACT EXTRACTION  2000  ? CESAREAN SECTION    ? ENDARTERECTOMY FEMORAL Left 07/26/2021  ? Procedure: LEFT COMMON FEMORAL ENDARTERECTOMY;  Surgeon: Broadus John, MD;  Location: [place];  Service: Vascular;  Laterality: Left;  ? EYE SURGERY    ? FEMORAL BYPASS Left July 23, 2010  ? LUMBAR DISC SURGERY    ? PATCH ANGIOPLASTY Left 07/26/2021  ? Procedure: PATCH ANGIOPLASTY WITH XENOSURE BIOLOGIC 1 cm x 6 cm PATCH;  Surgeon: Broadus John, MD;  Location: [place];  Service: Vascular;  Laterality: Left;  ? SPINE SURGERY    ?  ? ? ? ?Inpatient Medications: ?Scheduled Meds: ? apixaban  2.5 mg Oral BID  ? atorvastatin  40 mg Oral Daily  ? diltiazem  240 mg Oral Daily  ? docusate sodium  100 mg Oral Daily  ? ezetimibe  10 mg Oral Daily  ? glipiZIDE  10 mg Oral BID AC  ? insulin aspart  0-9 Units Subcutaneous TID WC  ? insulin glargine-yfgn  10 Units Subcutaneous QHS  ? lisinopril  20 mg Oral Daily  ? metoprolol tartrate  50 mg Oral BID  ? pantoprazole  40 mg Oral Daily  ? ?Continuous Infusions: ? sodium chloride    ? magnesium sulfate bolus IVPB    ? ?PRN Meds: ?sodium chloride, acetaminophen **OR** acetaminophen, alum & mag hydroxide-simeth, guaiFENesin-dextromethorphan, hydrALAZINE, magnesium sulfate bolus  IVPB, morphine injection, ondansetron, oxyCODONE-acetaminophen, phenol, potassium chloride ? ?Allergies:   No Known Allergies ? ?Social History:   ?Social History  ? ?Socioeconomic History  ? Marital status: Widowed  ?  Spouse name: Not on file  ? Number of children: 1  ? Years of education: Not on file  ? Highest education level: Not on file  ?Occupational History  ? Occupation: Retired  ?  Comment: retail Financial controller  ?Tobacco Use  ? Smoking status: Never  ? Smokeless tobacco: Never  ?Vaping Use  ? Vaping Use: Never used  ?Substance and Sexual Activity  ? Alcohol use: No  ? Drug use: No  ? Sexual activity: Not on file  ?Other Topics Concern  ? Not on file  ?Social History Narrative  ?  Lives alone. Has daily contact with friends. Transportation by friends and sometimes [employer].  ? ?Social Determinants of Health  ? ?Financial Resource Strain: Not on file  ?Food Insecurity: No Food Insecurity  ? Worried About Charity fundraiser in the Last Year: Never true  ? Ran Out of Food in the Last Year: Never true  ?Transportation Needs: No Transportation Needs  ? Lack of Transportation (Medical): No  ? Lack of Transportation (Non-Medical): No  ?Physical Activity: Not on file  ?Stress: Not on file  ?Social Connections: Not on file  ?Intimate Partner Violence: Not on file  ?  ?Family History:   ? ?Family History  ?Problem Relation Age of Onset  ? Hypertension Mother   ? Heart attack Mother   ? Diabetes Father   ? Hypertension Father   ? Stroke Father   ? Stroke Sister   ? Diabetes Sister   ? Heart disease Sister   ?     Before age 39-Aneurysm  ? Dementia Sister   ? Alzheimer's disease Sister   ? Diabetes Brother   ? Diabetes Brother   ? Heart disease Brother   ?     Before age 51  ? Heart attack Brother   ? Heart attack Sister   ? Diabetes Sister   ? Alzheimer's disease Sister   ?  ? ?ROS:  ?Please see the history of present illness.  ?All other ROS reviewed and negative.    ? ?Physical Exam/Data:  ? ?Vitals:  ? 07/28/21 0300 07/28/21 0646 07/28/21 0800 07/28/21 1140  ?BP: (!) 145/76 (!) 177/67 (!) 141/55 126/66  ?Pulse: 69 74 78 66  ?Resp: 17 13 13 14   ?Temp: 97.9 ?F (36.6 ?C)  97.7 ?F (36.5 ?C) 97.6 ?F (36.4 ?C)  ?TempSrc: Oral  Oral Oral  ?SpO2: 98%  97% 95%  ?Weight:      ?Height:      ? ? ?Intake/Output Summary (Last 24 hours) at 07/28/2021 1753 ?Last data filed at 07/28/2021 1732 ?Gross per 24 hour  ?Intake --  ?Output 1200 ml  ?Net -1200 ml  ? ? ?  07/26/2021  ?  6:09 AM 07/24/2021  ?  3:11 PM 07/11/2021  ?  6:55 AM  ?Last 3 Weights  ?Weight (lbs) 84 lb 1.6 oz 84 lb 1.6 oz 82 lb  ?Weight (kg) 38.148 kg 38.148 kg 37.195 kg  ?   ?Body mass index is 16.99 kg/m?.  ?General:  Frail; in pain ?HEENT: normal ?Neck:  no JVD ?Vascular: No carotid bruits; Distal pulses 2+ bilaterally ?Cardiac:  normal S1, S2; tachycardic ?Lungs: Poor airflow ?Abd: soft, nontender, no hepatomegaly  ?Ext: no edema ?Musculoskeletal:  No deformities, BUE  and BLE strength normal and equal ?Skin: warm and dry  ?Neuro:  CNs 2-12 intact, no focal abnormalities noted ?Psych:  Normal affect  ? ?EKG:  The EKG was personally reviewed and demonstrates:  Aflutter RVR ? ? ?Relevant CV Studies: ?Echo from 2021 prior showed normal EF ? ?Laboratory Data: ? ?High Sensitivity Troponin:  No results for input(s): TROPONINIHS in the last 720 hours.   ?Chemistry ?Recent Labs  ?Lab 07/24/21 ?1600 07/26/21 ?1131 07/27/21 ?0133  ?NA 139  --  135  ?K 4.1  --  3.8  ?CL 101  --  100  ?CO2 24  --  27  ?GLUCOSE 226*  --  263*  ?BUN 13  --  16  ?CREATININE 0.84 0.72 0.84  ?CALCIUM 9.9  --  8.8*  ?GFRNONAA >60 >60 >60  ?ANIONGAP 14  --  8  ?  ?Recent Labs  ?Lab 07/24/21 ?1600  ?PROT 7.7  ?ALBUMIN 4.6  ?AST 39  ?ALT 45*  ?ALKPHOS 65  ?BILITOT 0.7  ? ?Lipids No results for input(s): CHOL, TRIG, HDL, LABVLDL, LDLCALC, CHOLHDL in the last 168 hours.  ?Hematology ?Recent Labs  ?Lab 07/24/21 ?1600 07/26/21 ?1131 07/27/21 ?0133  ?WBC 6.4 9.2 8.7  ?RBC 3.77* 3.37* 3.26*  ?HGB 13.3 11.9* 11.5*  ?HCT 40.2 35.4* 34.0*  ?MCV 106.6* 105.0* 104.3*  ?MCH 35.3* 35.3* 35.3*  ?MCHC 33.1 33.6 33.8  ?RDW 14.6 14.2 14.1  ?PLT 219 174 185  ? ?Thyroid No results for input(s): TSH, FREET4 in the last 168 hours.  ?BNPNo results for input(s): BNP, PROBNP in the last 168 hours.  ?DDimer No results for input(s): DDIMER in the last 168 hours. ? ? ?Radiology/Studies:  ?MR BRAIN WO CONTRAST ? ?Result Date: 07/26/2021 ?CLINICAL DATA:  Initial evaluation for neuro deficit, stroke suspected. EXAM: MRI HEAD WITHOUT CONTRAST TECHNIQUE: Multiplanar, multiecho pulse sequences of the brain and surrounding structures were obtained without intravenous contrast. COMPARISON:  Prior CT from earlier the same day. FINDINGS:  Brain: Cerebral volume within normal limits. Confluent T2/FLAIR hyperintensity involving the periventricular and deep white matter of both cerebral hemispheres as well as the pons, moderately advanced in na

## 2021-07-28 NOTE — Plan of Care (Signed)
  Problem: Clinical Measurements: Goal: Will remain free from infection Outcome: Progressing Goal: Cardiovascular complication will be avoided Outcome: Progressing   Problem: Activity: Goal: Risk for activity intolerance will decrease Outcome: Progressing   Problem: Nutrition: Goal: Adequate nutrition will be maintained Outcome: Progressing   

## 2021-07-29 DIAGNOSIS — I4891 Unspecified atrial fibrillation: Secondary | ICD-10-CM | POA: Diagnosis not present

## 2021-07-29 LAB — GLUCOSE, CAPILLARY
Glucose-Capillary: 315 mg/dL — ABNORMAL HIGH (ref 70–99)
Glucose-Capillary: 382 mg/dL — ABNORMAL HIGH (ref 70–99)
Glucose-Capillary: 171 mg/dL — ABNORMAL HIGH (ref 70–99)
Glucose-Capillary: 411 mg/dL — ABNORMAL HIGH (ref 70–99)

## 2021-07-29 MED ORDER — METFORMIN HCL 500 MG PO TABS
500.0000 mg | ORAL_TABLET | Freq: Two times a day (BID) | ORAL | Status: DC
  Administered 2021-07-29 (×2): 500 mg via ORAL
  Filled 2021-07-29 (×2): qty 1

## 2021-07-29 MED ORDER — AMIODARONE HCL IN DEXTROSE 360-4.14 MG/200ML-% IV SOLN
60.0000 mg/h | INTRAVENOUS | Status: AC
Start: 2021-07-29 — End: 2021-07-29
  Administered 2021-07-29: 60 mg/h via INTRAVENOUS
  Filled 2021-07-29 (×2): qty 200

## 2021-07-29 MED ORDER — AMIODARONE HCL IN DEXTROSE 360-4.14 MG/200ML-% IV SOLN
30.0000 mg/h | INTRAVENOUS | Status: DC
  Administered 2021-07-29 – 2021-07-30 (×2): 30 mg/h via INTRAVENOUS
  Filled 2021-07-29 (×4): qty 200

## 2021-07-29 MED ORDER — AMIODARONE LOAD VIA INFUSION
150.0000 mg | Freq: Once | INTRAVENOUS | Status: AC
  Administered 2021-07-29: 150 mg via INTRAVENOUS
  Filled 2021-07-29: qty 83.34

## 2021-07-29 MED ORDER — ENSURE ENLIVE PO LIQD
237.0000 mL | Freq: Two times a day (BID) | ORAL | Status: DC
  Administered 2021-07-29 – 2021-07-30 (×3): 237 mL via ORAL

## 2021-07-29 NOTE — Progress Notes (Signed)
Cardiology PA paged for HR of 160 with Cardizem gttp infusing at max rate of 15mg / hr. Cardizem gttp was increased to 15mg /hr at 1043. At this time no new orders were given as they would like to give Cardizem a chance to work on pt's HR. Pt is laying bed comfortably watching tv. Pt did take AM dose of Metoprolol and Eliquis. Last BP 119/60 O2 98% on RA. Will continue to monitor.  ? ?Vianka Ertel M ? ?

## 2021-07-29 NOTE — Progress Notes (Addendum)
?  Progress Note ? ? ? ?07/29/2021 ?7:04 AM ?3 Days Post-Op ? ?Subjective:  no complaints but when thigh/groin touched, hypersensitive.  RN reports that son said when pt touched she has a hypersensitive reaction ie:  cold hands, etc.   ? ?afebirle ? ?Vitals:  ? 07/29/21 0630 07/29/21 0645  ?BP: (!) 107/54 112/60  ?Pulse: 81 (!) 41  ?Resp: 17 (!) 25  ?Temp:    ?SpO2: 96% 97%  ? ? ?Physical Exam: ?Cardiac:  irregular ?Lungs:  non labored ?Incisions:  left groin incision is clean and dry without evidence of infection ?Extremities:  brisk left PT doppler signal ? ?CBC ?   ?Component Value Date/Time  ? WBC 8.7 07/27/2021 0133  ? RBC 3.26 (L) 07/27/2021 0133  ? HGB 11.5 (L) 07/27/2021 0133  ? HGB 13.9 12/04/2020 0824  ? HCT 34.0 (L) 07/27/2021 0133  ? HCT 42.4 12/04/2020 0824  ? PLT 185 07/27/2021 0133  ? PLT 224 12/04/2020 0824  ? MCV 104.3 (H) 07/27/2021 0133  ? MCV 101 (H) 12/04/2020 0824  ? MCH 35.3 (H) 07/27/2021 0133  ? MCHC 33.8 07/27/2021 0133  ? RDW 14.1 07/27/2021 0133  ? RDW 11.9 12/04/2020 0824  ? LYMPHSABS 1.0 09/10/2020 0954  ? LYMPHSABS 1.7 04/22/2019 1420  ? MONOABS 0.5 09/10/2020 0954  ? EOSABS 0.0 09/10/2020 0954  ? EOSABS 0.1 04/22/2019 1420  ? BASOSABS 0.0 09/10/2020 0954  ? BASOSABS 0.0 04/22/2019 1420  ? ? ?BMET ?   ?Component Value Date/Time  ? NA 135 07/27/2021 0133  ? NA 142 12/04/2020 0824  ? K 3.8 07/27/2021 0133  ? CL 100 07/27/2021 0133  ? CO2 27 07/27/2021 0133  ? GLUCOSE 263 (H) 07/27/2021 0133  ? BUN 16 07/27/2021 0133  ? BUN 21 12/04/2020 0824  ? CREATININE 0.84 07/27/2021 0133  ? CREATININE 0.79 03/21/2016 1116  ? CALCIUM 8.8 (L) 07/27/2021 0133  ? GFRNONAA >60 07/27/2021 0133  ? GFRAA >60 06/26/2019 0457  ? ? ?INR ?   ?Component Value Date/Time  ? INR 1.0 07/24/2021 1600  ? INR 4.3 06/06/2010 1010  ? ? ? ?Intake/Output Summary (Last 24 hours) at 07/29/2021 0704 ?Last data filed at 07/29/2021 [phone] ?Gross per 24 hour  ?Intake 115.51 ml  ?Output 1300 ml  ?Net -1184.49 ml   ? ? ? ?Assessment/Plan:  88 y.o. female is s/p:  ?L CFA endarterectomy  ?3 Days Post-Op ? ? ?-pt continues to have brisk left PT doppler signal.  Her left groin looks good and no evidence of infection. ?-afib yesterday with RVR-appreciate cardiology assistance. For echo this am. ?-DVT prophylaxis:  pt transitioned to eliquis yesterday. ?-when ready for dc, would benefit from temporary SNF per PT ?-blood sugar 315 this am. Pt was on Metformin 1000mg  bid prior to admission.  Discussed with Dr. Scot Dock and will restart back at 500mg  bid and advance from there.   ? ?Leontine Locket, PA-C ?[facility] ?[phone] ?07/29/2021 ?7:04 AM ? ?I have interviewed the patient and examined the patient. I agree with the findings by the PA.  Appreciate cardiology's help.  Her heart rate is now in the 80s.  Her left foot is warm and well-perfused and her left groin incision looks fine. ? ?Gae Gallop, MD ? ? ?

## 2021-07-29 NOTE — TOC Progression Note (Addendum)
Transition of Care (TOC) - Progression Note  ? ? ?Patient Details  ?Name: [name] ?MRN: [id number] ?Date of Birth: 1932-05-17 ? ?Transition of Care (TOC) CM/SW Contact  ?Patrice Paradise, LCSW ?Phone [phone] ?07/29/2021, 11:42 AM ? ?Clinical Narrative:    ?CSW spoke with pt's daughter-[name] about bed offers and let her know that [facility] have offered a bed. [name] confirmed that they would like pt to go to [facility]. CSW spoke with Alinda Money at [facility] and he stated that they would have bed for pt. ? ?CSW attempted to check authorization through portal and noticed one had not been started. CSW called [employer] and at first they stated that pt was not managed by them however stated that she was and new authorization was started with a start date of 07/30/2021. When CSW went to upload clinicals it stated that pt was not managed. TOC will need to follow up on Monday. Clinicals uploaded. Reference # [id number]. ? ? ?TOC team will continue to assist with discharge planning needs.  ? ?Expected Discharge Plan: Skilled Nursing Facility ?Barriers to Discharge: Continued Medical Work up, English as a second language teacher, SNF Pending bed offer ? ?Expected Discharge Plan and Services ?Expected Discharge Plan: Skilled Nursing Facility ?  ?  ?Post Acute Care Choice: Skilled Nursing Facility ?Living arrangements for the past 2 months: Single Family Home ?                ?  ?  ?  ?  ?  ?  ?  ?  ?  ?  ? ? ?Social Determinants of Health (SDOH) Interventions ?  ? ?Readmission Risk Interventions ?   ? View : No data to display.  ?  ?  ?  ? ? ?

## 2021-07-29 NOTE — Progress Notes (Signed)
? ?Progress Note ? ?Patient Name: FATIME ANCIRA ?Date of Encounter: 07/29/2021 ? ?[facility] Cardiologist: Kirk Ruths, MD  ? ?Subjective  ? ?No complaints, no palpitations.  ? ?Inpatient Medications  ?  ?Scheduled Meds: ? apixaban  2.5 mg Oral BID  ? atorvastatin  40 mg Oral Daily  ? docusate sodium  100 mg Oral Daily  ? ezetimibe  10 mg Oral Daily  ? feeding supplement  237 mL Oral BID BM  ? glipiZIDE  10 mg Oral BID AC  ? insulin aspart  0-9 Units Subcutaneous TID WC  ? insulin glargine-yfgn  10 Units Subcutaneous QHS  ? lisinopril  20 mg Oral Daily  ? metFORMIN  500 mg Oral BID  ? metoprolol tartrate  50 mg Oral BID  ? pantoprazole  40 mg Oral Daily  ? ?Continuous Infusions: ? sodium chloride    ? diltiazem (CARDIZEM) infusion 15 mg/hr (07/29/21 1043)  ? magnesium sulfate bolus IVPB    ? ?PRN Meds: ?sodium chloride, acetaminophen **OR** acetaminophen, alum & mag hydroxide-simeth, guaiFENesin-dextromethorphan, hydrALAZINE, magnesium sulfate bolus IVPB, morphine injection, ondansetron, oxyCODONE-acetaminophen, phenol, potassium chloride  ? ?Vital Signs  ?  ?Vitals:  ? 07/29/21 0630 07/29/21 0645 07/29/21 0944 07/29/21 1051  ?BP: (!) 107/54 112/60 115/66   ?Pulse: 81 (!) 41 71 (!) 160  ?Resp: 17 (!) 25 (!) 21 (!) 21  ?Temp:   97.6 ?F (36.4 ?C)   ?TempSrc:   Oral   ?SpO2: 96% 97% 97%   ?Weight:      ?Height:      ? ? ?Intake/Output Summary (Last 24 hours) at 07/29/2021 1110 ?Last data filed at 07/29/2021 [phone] ?Gross per 24 hour  ?Intake 115.51 ml  ?Output 1300 ml  ?Net -1184.49 ml  ? ? ?  07/29/2021  ?  4:00 AM 07/26/2021  ?  6:09 AM 07/24/2021  ?  3:11 PM  ?Last 3 Weights  ?Weight (lbs) 83 lb 5.3 oz 84 lb 1.6 oz 84 lb 1.6 oz  ?Weight (kg) 37.8 kg 38.148 kg 38.148 kg  ?   ? ?Telemetry  ?  ?Afib variable rates - Personally Reviewed ? ?ECG  ?  ?N/a - Personally Reviewed ? ?Physical Exam  ? ?GEN: No acute distress.   ?Neck: No JVD ?Cardiac: irreg, tachy ?Respiratory: Clear to auscultation bilaterally. ?GI: Soft,  nontender, non-distended  ?MS: No edema; No deformity. ?Neuro:  Nonfocal  ?Psych: Normal affect  ? ?Labs  ?  ?High Sensitivity Troponin:  No results for input(s): TROPONINIHS in the last 720 hours.   ?Chemistry ?Recent Labs  ?Lab 07/24/21 ?1600 07/26/21 ?1131 07/27/21 ?0133  ?NA 139  --  135  ?K 4.1  --  3.8  ?CL 101  --  100  ?CO2 24  --  27  ?GLUCOSE 226*  --  263*  ?BUN 13  --  16  ?CREATININE 0.84 0.72 0.84  ?CALCIUM 9.9  --  8.8*  ?PROT 7.7  --   --   ?ALBUMIN 4.6  --   --   ?AST 39  --   --   ?ALT 45*  --   --   ?ALKPHOS 65  --   --   ?BILITOT 0.7  --   --   ?GFRNONAA >60 >60 >60  ?ANIONGAP 14  --  8  ?  ?Lipids No results for input(s): CHOL, TRIG, HDL, LABVLDL, LDLCALC, CHOLHDL in the last 168 hours.  ?Hematology ?Recent Labs  ?Lab 07/24/21 ?1600 07/26/21 ?1131 07/27/21 ?0133  ?  WBC 6.4 9.2 8.7  ?RBC 3.77* 3.37* 3.26*  ?HGB 13.3 11.9* 11.5*  ?HCT 40.2 35.4* 34.0*  ?MCV 106.6* 105.0* 104.3*  ?MCH 35.3* 35.3* 35.3*  ?MCHC 33.1 33.6 33.8  ?RDW 14.6 14.2 14.1  ?PLT 219 174 185  ? ?Thyroid No results for input(s): TSH, FREET4 in the last 168 hours.  ?BNPNo results for input(s): BNP, PROBNP in the last 168 hours.  ?DDimer No results for input(s): DDIMER in the last 168 hours.  ? ?Radiology  ?  ?No results found. ? ?Cardiac Studies  ? ? ? ?Patient Profile  ?   ?TIAUNNA STOCKWELL is a 88 y.o. female with a hx of afibb on eliquis, DM2, DVTs, HTN, HLD, Peripheral arterial disease who is being seen 07/28/2021 for the evaluation of Afib RVR at the request of Dr. Virl Cagey.  ? ?Assessment & Plan  ?  ?1.PAF (SR on admission EKG) ?- issues with RVR this admit post op left common fem endaretctomy ?- HRs to 150s yesterday, received IV lopressor and then started on dilt gtt. Now on drip at 15, oral lopressor 50mg  bid.  ?- home regimen is lopressor 50mg  bid, diltaizem 240mg  daily ?- she is on eliquis for stroke prevention, held for recent leg surgyer but back on.   ? ?- on high dose dilt gtt, oral lopressor. Rates remain elevated,  bp's on the soft side. Was off eliquis few days so would require TEE/DCCV if were to pursue. Given significant tachy today and soft bp's will start amiodarone drip with plans for short course. I think outside of postop period her afib will likely be controlled with her prior regimen of dilt and lopressor.  ?- rehceck electrolytes with AM labs ? ?2.PAD ?Left common femoral endarterectomy this admission ? ?For questions or updates, please contact [place] ?Please consult [email or web address] for contact info under  ? ?  ?   ?Signed, ?Carlyle Dolly, MD  ?07/29/2021, 11:10 AM   ? ?

## 2021-07-29 NOTE — Progress Notes (Signed)
?   07/28/21 2000  ?Assess: MEWS Score  ?Pulse Rate (!) 150  ?ECG Heart Rate (!) 150  ?Resp 19  ?SpO2 96 %  ?Assess: MEWS Score  ?MEWS Temp 0  ?MEWS Systolic 0  ?MEWS Pulse 3  ?MEWS RR 0  ?MEWS LOC 0  ?MEWS Score 3  ?MEWS Score Color Yellow  ?Assess: if the MEWS score is Yellow or Red  ?Were vital signs taken at a resting state? Yes  ?Focused Assessment No change from prior assessment  ?Early Detection of Sepsis Score *See Row Information* Low  ?Treat  ?MEWS Interventions Administered scheduled meds/treatments  ?Pain Scale 0-10  ?Pain Score 0  ?Take Vital Signs  ?Increase Vital Sign Frequency  Yellow: Q 2hr X 2 then Q 4hr X 2, if remains yellow, continue Q 4hrs  ?Escalate  ?MEWS: Escalate Yellow: discuss with charge nurse/RN and consider discussing with provider and RRT  ?Notify: Charge Nurse/RN  ?Name of Charge Nurse/RN Notified Corey Skains, RN  ?Date Charge Nurse/RN Notified 07/28/21  ?Time Charge Nurse/RN Notified 2000  ?Notify: Provider  ?Provider Name/Title Dr Welton Flakes  ?Date Provider Notified 07/28/21  ?Time Provider Notified 1900  ?Notification Type Call  ?Notification Reason Change in status ?(Afib RVR)  ?Provider response See new orders  ?Date of Provider Response 07/28/21  ?Time of Provider Response [phone]  ?Document  ?Patient Outcome Stabilized after interventions  ?Progress note created (see row info) Yes  ? ? ?

## 2021-07-29 NOTE — Plan of Care (Signed)
  Problem: Education: Goal: Knowledge of General Education information will improve Description Including pain rating scale, medication(s)/side effects and non-pharmacologic comfort measures Outcome: Progressing   

## 2021-07-30 ENCOUNTER — Inpatient Hospital Stay (HOSPITAL_COMMUNITY): Payer: Medicare HMO | Admitting: Anesthesiology

## 2021-07-30 DIAGNOSIS — I4892 Unspecified atrial flutter: Secondary | ICD-10-CM

## 2021-07-30 DIAGNOSIS — I70222 Atherosclerosis of native arteries of extremities with rest pain, left leg: Secondary | ICD-10-CM | POA: Diagnosis not present

## 2021-07-30 LAB — BASIC METABOLIC PANEL
Anion gap: 9 (ref 5–15)
BUN: 22 mg/dL (ref 8–23)
CO2: 28 mmol/L (ref 22–32)
Calcium: 9.3 mg/dL (ref 8.9–10.3)
Chloride: 101 mmol/L (ref 98–111)
Creatinine, Ser: 0.75 mg/dL (ref 0.44–1.00)
GFR, Estimated: >60 mL/min (ref >60)
Glucose, Bld: 158 mg/dL — ABNORMAL HIGH (ref 70–99)
Potassium: 3.9 mmol/L (ref 3.5–5.1)
Sodium: 138 mmol/L (ref 135–145)

## 2021-07-30 LAB — MAGNESIUM: Magnesium: 1.6 mg/dL — ABNORMAL LOW (ref 1.7–2.4)

## 2021-07-30 LAB — GLUCOSE, CAPILLARY
Glucose-Capillary: 249 mg/dL — ABNORMAL HIGH (ref 70–99)
Glucose-Capillary: 296 mg/dL — ABNORMAL HIGH (ref 70–99)
Glucose-Capillary: 145 mg/dL — ABNORMAL HIGH (ref 70–99)
Glucose-Capillary: 375 mg/dL — ABNORMAL HIGH (ref 70–99)

## 2021-07-30 LAB — CBC
HCT: 35.4 % — ABNORMAL LOW (ref 36.0–46.0)
Hemoglobin: 12 g/dL (ref 12.0–15.0)
MCH: 35.4 pg — ABNORMAL HIGH (ref 26.0–34.0)
MCHC: 33.9 g/dL (ref 30.0–36.0)
MCV: 104.4 fL — ABNORMAL HIGH (ref 80.0–100.0)
Platelets: 174 10*3/uL (ref 150–400)
RBC: 3.39 MIL/uL — ABNORMAL LOW (ref 3.87–5.11)
RDW: 14.1 % (ref 11.5–15.5)
WBC: 9.6 10*3/uL (ref 4.0–10.5)
nRBC: 0 % (ref 0.0–0.2)

## 2021-07-30 MED ORDER — DILTIAZEM LOAD VIA INFUSION
10.0000 mg | Freq: Once | INTRAVENOUS | Status: AC
  Administered 2021-07-30: 10 mg via INTRAVENOUS
  Filled 2021-07-30: qty 10

## 2021-07-30 MED ORDER — ADULT MULTIVITAMIN W/MINERALS CH
1.0000 | ORAL_TABLET | Freq: Every day | ORAL | Status: DC
Start: 2021-07-30 — End: 2021-08-08
  Administered 2021-07-30 – 2021-08-08 (×10): 1 via ORAL
  Filled 2021-07-30 (×9): qty 1

## 2021-07-30 MED ORDER — HYALURONIDASE HUMAN 150 UNIT/ML IJ SOLN
200.0000 [IU] | Freq: Once | INTRAMUSCULAR | Status: AC
  Administered 2021-07-30: 200 [IU] via SUBCUTANEOUS
  Filled 2021-07-30: qty 1.33

## 2021-07-30 MED ORDER — ENSURE ENLIVE PO LIQD
237.0000 mL | Freq: Three times a day (TID) | ORAL | Status: DC
  Administered 2021-07-30 – 2021-08-08 (×18): 237 mL via ORAL

## 2021-07-30 MED ORDER — AMIODARONE IV BOLUS ONLY 150 MG/100ML
150.0000 mg | Freq: Once | INTRAVENOUS | Status: AC
  Administered 2021-07-30: 150 mg via INTRAVENOUS
  Filled 2021-07-30: qty 100

## 2021-07-30 MED ORDER — METFORMIN HCL 500 MG PO TABS
1000.0000 mg | ORAL_TABLET | Freq: Two times a day (BID) | ORAL | Status: DC
Start: 2021-07-30 — End: 2021-08-08
  Administered 2021-07-30 – 2021-08-08 (×18): 1000 mg via ORAL
  Filled 2021-07-30 (×20): qty 2

## 2021-07-30 MED ORDER — HYALURONIDASE OVINE 200 UNIT/ML IJ SOLN
200.0000 [IU] | Freq: Once | INTRAMUSCULAR | Status: DC
  Filled 2021-07-30: qty 1

## 2021-07-30 MED ORDER — DILTIAZEM HCL-DEXTROSE 125-5 MG/125ML-% IV SOLN (PREMIX)
5.0000 mg/h | INTRAVENOUS | Status: DC
  Administered 2021-07-30: 5 mg/h via INTRAVENOUS
  Filled 2021-07-30 (×3): qty 125

## 2021-07-30 NOTE — Progress Notes (Signed)
Physical Therapy Treatment ?Patient Details ?Name: [name] ?MRN: [id number] ?DOB: 1932-05-14 ?Today's Date: 07/30/2021 ? ? ?History of Present Illness 88 y.o. female who presented 07/26/21 for L CFA endarterectomy secondary to [place] 4 critical limb ischemia in the left leg. Code Stroke called after surgery when pt was disoriented and with L facial droop. MRI of head was negative with neuro signing off and indicating she would benefit from an outpatient dementia evaluation. Now she is in Afib RV/Aflutter. PMH: afib, DM, DVT, HTN, HLD, PAD, CVA. ? ?  ?PT Comments  ? ? Pt received in recliner, lethargic and unable to answer orientation questions, appearing uncomfortable and RN clearance for mobility with goal of standing balance assessment and return to supine. Pt with poor to zero standing balance, with crouched posture and difficulty using RW due to increased R forearm pain. Pt needed up to maxA +2 for sit>stand and totalA +2 for stand pivot from chair>bed. Pt HR remains elevated 92-142 bpm at rest and during mobility, RN aware. Pt resting in supine with HOB >30* for safety, gentle classical music playing and heels floated/SCDs donned, bed alarm on. Pt continues to benefit from PT services to progress toward functional mobility goals. Plan to progress standing tolerance/gait training next session if pt able to tolerate once more alert.   ?Recommendations for follow up therapy are one component of a multi-disciplinary discharge planning process, led by the attending physician.  Recommendations may be updated based on patient status, additional functional criteria and insurance authorization. ? ?Follow Up Recommendations ? Skilled nursing-short term rehab (<3 hours/day) ?  ?  ?Assistance Recommended at Discharge Frequent or constant Supervision/Assistance  ?Patient can return home with the following A little help with walking and/or transfers;A little help with bathing/dressing/bathroom;Assistance with  cooking/housework;Direct supervision/assist for medications management;Direct supervision/assist for financial management;Assist for transportation;Help with stairs or ramp for entrance ?  ?Equipment Recommendations ? None recommended by PT  ?  ?Recommendations for Other Services   ? ? ?  ?Precautions / Restrictions Precautions ?Precautions: Fall ?Precaution Comments: tachycardia, R forearm recent IV infiltration/pain ?Restrictions ?Weight Bearing Restrictions: No  ?  ? ?Mobility ? Bed Mobility ?Overal bed mobility: Needs Assistance ?Bed Mobility: Rolling, Sit to Sidelying ?Rolling: Max assist ?  ?  ?  ?Sit to sidelying: Max assist, +2 for physical assistance ?General bed mobility comments: trunk and BLE assist, pt assisting slightly with transition but calling out due to pain and pt needs multimodal cues to initiate/perform; rolling L/R x2 reps ea for repositioning and replacement of bed pad after leaking of urine around purewick, gown also changed ?  ? ?Transfers ?Overall transfer level: Needs assistance ?Equipment used: Rolling walker (2 wheels) ?Transfers: Sit to/from Stand, Bed to chair/wheelchair/BSC ?Sit to Stand: Max assist, +2 physical assistance ?Stand pivot transfers: +2 physical assistance, Max assist ?  ?  ?  ?  ?General transfer comment: pt lethargic and not following simple 1-step cues, needs hand over hand assist and max cues for UE placement, utilized bed pad to lift/advance hips when standing and up to totalA to pivot to her R side from chair>bed. Pt distracted due to pain vs cognitive deficit and difficulty sequencing tasks compared with baseline. ?  ? ?Ambulation/Gait ?  ?  ?  ?General Gait Details: defer for safety, pt unable to stand fully upright, crouching at RW with heavy posterior bias upon standing and needing up to [place] with bed pad to pivot to bed from chair. ? ? ? ? ?  ?  Balance Overall balance assessment: Needs assistance ?Sitting-balance support: No upper extremity supported, Feet  supported ?Sitting balance-Leahy Scale: Fair ?  ?  ?Standing balance support: Bilateral upper extremity supported, During functional activity, Reliant on assistive device for balance ?Standing balance-Leahy Scale: Zero ?Standing balance comment: heavy posterior lean and crouched posture at RW, needs +2 external assist up to totalA +2 ?  ?  ?  ?  ?  ?  ?  ?  ? ?  ?Cognition Arousal/Alertness: Lethargic ?Behavior During Therapy: Flat affect, Anxious ?Overall Cognitive Status: History of cognitive impairments - at baseline ?  ?  ?  ?  ?  ?General Comments: Son reporting that pt's orientation and cognition varies throughout the day and week. He has to provide orientation each morning and cues for functional tasks. Pt more drowsy after sitting up in chair >1.5 hrs and keeps eyes closed frequently when resting, pt having difficulty following simple 1-step mobility commands needs multimodal cues to initiate/perform each task. ?  ?  ? ?  ?Exercises Other Exercises ?Other Exercises: supine BLE AAROM: ankle pumps/circles x10 reps ea; encouraged pt to continue hourly but will need reinforcement ? ?  ?General Comments General comments (skin integrity, edema, etc.): BP 130/82 (97) sitting in chair; HR 92-142 bpm resting and with exertion; pt R forearm noted to be reddened and painful to gentle touch, pt unable to localize pain but guarding groin incision and R forearm throughout. ?  ?  ? ?Pertinent Vitals/Pain Pain Assessment ?Pain Assessment: Faces ?Faces Pain Scale: Hurts whole lot ?Pain Location: R forearm (after IV infiltration), L groin pain post-op (R forearm appears most painful area) ?Pain Descriptors / Indicators: Discomfort, Operative site guarding, Grimacing, Moaning ?Pain Intervention(s): Limited activity within patient's tolerance, Monitored during session, Repositioned, Ice applied (ice to R forearm in supine at end of session, RN notified it will need to be removed in 20-30 mins)  ? ? ? ?PT Goals (current goals  can now be found in the care plan section) Acute Rehab PT Goals ?Patient Stated Goal: decreased pain ?PT Goal Formulation: With patient/family ?Time For Goal Achievement: 08/10/21 ?Progress towards PT goals: Not progressing toward goals - comment (pain/lethargy limiting her today) ? ?  ?Frequency ? ? ? Min 3X/week ? ? ? ?  ?PT Plan Current plan remains appropriate  ? ? ?   ?AM-PAC PT "6 Clicks" Mobility   ?Outcome Measure ? Help needed turning from your back to your side while in a flat bed without using bedrails?: A Lot ?Help needed moving from lying on your back to sitting on the side of a flat bed without using bedrails?: A Lot ?Help needed moving to and from a bed to a chair (including a wheelchair)?: Total ?Help needed standing up from a chair using your arms (e.g., wheelchair or bedside chair)?: Total ?Help needed to walk in hospital room?: Total ?Help needed climbing 3-5 steps with a railing? : Total ?6 Click Score: 8 ? ?  ?End of Session Equipment Utilized During Treatment: Gait belt ?Activity Tolerance: Patient limited by lethargy;Patient limited by pain ?Patient left: in bed;with call bell/phone within reach;with bed alarm set;Other (comment);with SCD's reapplied (B heels floated) ?Nurse Communication: Mobility status;Patient requests pain meds;Other (comment) (ice placed on R forearm, pt still appears in a lot of pain but unable to localize or verbalize severity) ?PT Visit Diagnosis: Unsteadiness on feet (R26.81);Other abnormalities of gait and mobility (R26.89);Muscle weakness (generalized) (M62.81);Difficulty in walking, not elsewhere classified (R26.2) ?  ? ? ?Time:  [id number] ?PT Time Calculation (min) (ACUTE ONLY): 35 min ? ?Charges:  $Therapeutic Activity: 23-37 mins          ?          ? ?Jessice Madill P., PTA ?Acute Rehabilitation Services ?Secure Chat Preferred 9a-5:30pm ?Office: [phone]  ? ? ?Thatiana Renbarger M Dalma Panchal ?07/30/2021, 3:48 PM ? ?

## 2021-07-30 NOTE — Progress Notes (Signed)
?  Progress Note ? ? ? ?07/30/2021 ?10:22 AM ?4 Days Post-Op ? ?Subjective:  initially when walking in room patient acting in distress grabbing her lower abdomen. Not really able to verbalize her discomfort. After urinating with peri wick she acted as if she had relief. No longer in distress.   ? ? ?Vitals:  ? 07/30/21 0600 07/30/21 0800  ?BP: 128/82 (!) 145/89  ?Pulse: (!) 138 (!) 140  ?Resp: 19 20  ?Temp:  97.9 ?F (36.6 ?C)  ?SpO2: 95% 96%  ? ?Physical Exam: ?Cardiac:  irregular ?Lungs:  non labored ?Incisions:  left groin incision c/d/I without swelling or hematoma ?Extremities:  well perfused and warm. Brisk left PT signal, palpable right DP, doppler PT ?Abdomen:  flat, soft, non distended ?Neurologic: alert ? ?CBC ?   ?Component Value Date/Time  ? WBC 8.7 07/27/2021 0133  ? RBC 3.26 (L) 07/27/2021 0133  ? HGB 11.5 (L) 07/27/2021 0133  ? HGB 13.9 12/04/2020 0824  ? HCT 34.0 (L) 07/27/2021 0133  ? HCT 42.4 12/04/2020 0824  ? PLT 185 07/27/2021 0133  ? PLT 224 12/04/2020 0824  ? MCV 104.3 (H) 07/27/2021 0133  ? MCV 101 (H) 12/04/2020 0824  ? MCH 35.3 (H) 07/27/2021 0133  ? MCHC 33.8 07/27/2021 0133  ? RDW 14.1 07/27/2021 0133  ? RDW 11.9 12/04/2020 0824  ? LYMPHSABS 1.0 09/10/2020 0954  ? LYMPHSABS 1.7 04/22/2019 1420  ? MONOABS 0.5 09/10/2020 0954  ? EOSABS 0.0 09/10/2020 0954  ? EOSABS 0.1 04/22/2019 1420  ? BASOSABS 0.0 09/10/2020 0954  ? BASOSABS 0.0 04/22/2019 1420  ? ? ?BMET ?   ?Component Value Date/Time  ? NA 138 07/30/2021 0416  ? NA 142 12/04/2020 0824  ? K 3.9 07/30/2021 0416  ? CL 101 07/30/2021 0416  ? CO2 28 07/30/2021 0416  ? GLUCOSE 158 (H) 07/30/2021 0416  ? BUN 22 07/30/2021 0416  ? BUN 21 12/04/2020 0824  ? CREATININE 0.75 07/30/2021 0416  ? CREATININE 0.79 03/21/2016 1116  ? CALCIUM 9.3 07/30/2021 0416  ? GFRNONAA >60 07/30/2021 0416  ? GFRAA >60 06/26/2019 0457  ? ? ?INR ?   ?Component Value Date/Time  ? INR 1.0 07/24/2021 1600  ? INR 4.3 06/06/2010 1010  ? ? ? ?Intake/Output Summary (Last 24  hours) at 07/30/2021 1022 ?Last data filed at 07/30/2021 0703 ?Gross per 24 hour  ?Intake 514.22 ml  ?Output 1200 ml  ?Net -685.78 ml  ? ? ? ? ?Assessment/Plan:  88 y.o. female is s/p Left CFA endarterectomy 4 Days Post-Op  ? ?- left groin looks good c/d/I without swelling or hematoma ?- pt continues to have brisk left PT doppler signal. Palpable right Dp, doppler PT ?-PAF- appreciate cardiology assistance. Started Amio gtt, on Eliquis. HR's remain in 130-140's ?-Hyperglycemic. Pt was on Metformin 1000mg  bid prior to admission. Will resume home regimen of Metformin at 1000 mg BID ?- TOC assisting with SNF placement/auth ? ?  ?DVT prophylaxis:  Eliquis ? ?Broadus John, PA-C ?[facility] ?[phone] ?07/30/2021 ?10:22 AM ? ?VASCULAR STAFF ADDENDUM: ?I have independently interviewed and examined the patient. ?I agree with the above.  ?Pt with significant decline since POD1 - appears more frail. Needs aggressive PT/ OT, OOB with nursing.  ?Recent amio IV infiltration, looks okay on exam.  ?Pt oriented appropriately ?Will continue to follow and check CBC ? ? ?J. Melene Muller, MD ?[facility] ?Office Phone Number: [phone] ?07/30/2021 10:24 AM ? ? ?

## 2021-07-30 NOTE — Progress Notes (Signed)
Patient on Amio and Cardizem drips- HR still in the 130s-140's. Paged MD to inform. ?[name]  ?

## 2021-07-30 NOTE — Care Management Important Message (Signed)
Important Message ? ?Patient Details  ?Name: [name] ?MRN: [id number] ?Date of Birth: [birth year] ? ? ?Medicare Important Message Given:  Yes ? ? ? ? ?Renie Ora ?07/30/2021, 10:12 AM ?

## 2021-07-30 NOTE — Anesthesia Preprocedure Evaluation (Addendum)
Anesthesia Evaluation  ?Patient identified by MRN, date of birth, ID band ?Patient confused ? ? ? ?Reviewed: ?Allergy & Precautions, NPO status , Patient's Chart, lab work & pertinent test results, reviewed documented beta blocker date and time  ? ?Airway ?Mallampati: IV ? ?TM Distance: >3 FB ?Neck ROM: Full ? ?Mouth opening: Limited Mouth Opening ? Dental ? ?(+) Poor Dentition, Dental Advisory Given, Missing ?  ?Pulmonary ?neg pulmonary ROS,  ?  ?Pulmonary exam normal ?breath sounds clear to auscultation ? ? ? ? ? ? Cardiovascular ?hypertension, Pt. on home beta blockers and Pt. on medications ?+ Peripheral Vascular Disease and + DVT  ?Normal cardiovascular exam+ dysrhythmias (on eliquis) Atrial Fibrillation  ?Rhythm:Irregular Rate:Normal ? ?TTE 2021 ??1. Left ventricular ejection fraction, by estimation, is 50 to 55%. The  ?left ventricle has low normal function. The left ventricle has no regional  ?wall motion abnormalities. Indeterminate diastolic filling due to E-A  ?fusion.  ??2. Right ventricular systolic function is mildly reduced. The right  ?ventricular size is normal. There is normal pulmonary artery systolic  ?pressure.  ??3. The mitral valve is normal in structure. Trivial mitral valve  ?regurgitation.  ??4. The aortic valve was not well visualized. Aortic valve regurgitation  ?is not visualized.  ?  ?Neuro/Psych ?CVA negative psych ROS  ? GI/Hepatic ?negative GI ROS, Neg liver ROS,   ?Endo/Other  ?diabetes ? Renal/GU ?negative Renal ROS  ?negative genitourinary ?  ?Musculoskeletal ? ?(+) Arthritis ,  ? Abdominal ?  ?Peds ? Hematology ?negative hematology ROS ?(+)   ?Anesthesia Other Findings ? ? Reproductive/Obstetrics ? ?  ? ? ? ? ? ? ? ? ? ? ? ? ? ?  ?  ? ? ? ? ? ? ? ?Anesthesia Physical ?Anesthesia Plan ? ?ASA: 3 ? ?Anesthesia Plan: MAC  ? ?Post-op Pain Management:   ? ?Induction: Intravenous ? ?PONV Risk Score and Plan: Propofol infusion and Treatment may vary due  to age or medical condition ? ?Airway Management Planned: Natural Airway ? ?Additional Equipment:  ? ?Intra-op Plan:  ? ?Post-operative Plan:  ? ?Informed Consent: I have reviewed the patients History and Physical, chart, labs and discussed the procedure including the risks, benefits and alternatives for the proposed anesthesia with the patient or authorized representative who has indicated his/her understanding and acceptance.  ? ? ? ?Dental advisory given ? ?Plan Discussed with: CRNA ? ?Anesthesia Plan Comments: (Case cancelled by proceduralist for changes in mental status.)  ? ? ? ? ? ?Anesthesia Quick Evaluation ? ?

## 2021-07-30 NOTE — Progress Notes (Signed)
?  Progress Note ? ? ? ?07/30/2021 ?7:35 AM ?4 Days Post-Op ? ?Subjective:  initially when walking in room patient acting in distress grabbing her lower abdomen. Not really able to verbalize her discomfort. After urinating with peri wick she acted as if she had relief. No longer in distress.   ? ? ?Vitals:  ? 07/29/21 2000 07/30/21 0400  ?BP: 133/78   ?Pulse: (!) 135   ?Resp: 18 18  ?Temp: 98.7 ?F (37.1 ?C) 98.5 ?F (36.9 ?C)  ?SpO2: 96%   ? ?Physical Exam: ?Cardiac:  irregular ?Lungs:  non labored ?Incisions:  left groin incision c/d/I without swelling or hematoma ?Extremities:  well perfused and warm. Brisk left PT signal, palpable right DP, doppler PT ?Abdomen:  flat, soft, non distended ?Neurologic: alert ? ?CBC ?   ?Component Value Date/Time  ? WBC 8.7 07/27/2021 0133  ? RBC 3.26 (L) 07/27/2021 0133  ? HGB 11.5 (L) 07/27/2021 0133  ? HGB 13.9 12/04/2020 0824  ? HCT 34.0 (L) 07/27/2021 0133  ? HCT 42.4 12/04/2020 0824  ? PLT 185 07/27/2021 0133  ? PLT 224 12/04/2020 0824  ? MCV 104.3 (H) 07/27/2021 0133  ? MCV 101 (H) 12/04/2020 0824  ? MCH 35.3 (H) 07/27/2021 0133  ? MCHC 33.8 07/27/2021 0133  ? RDW 14.1 07/27/2021 0133  ? RDW 11.9 12/04/2020 0824  ? LYMPHSABS 1.0 09/10/2020 0954  ? LYMPHSABS 1.7 04/22/2019 1420  ? MONOABS 0.5 09/10/2020 0954  ? EOSABS 0.0 09/10/2020 0954  ? EOSABS 0.1 04/22/2019 1420  ? BASOSABS 0.0 09/10/2020 0954  ? BASOSABS 0.0 04/22/2019 1420  ? ? ?BMET ?   ?Component Value Date/Time  ? NA 138 07/30/2021 0416  ? NA 142 12/04/2020 0824  ? K 3.9 07/30/2021 0416  ? CL 101 07/30/2021 0416  ? CO2 28 07/30/2021 0416  ? GLUCOSE 158 (H) 07/30/2021 0416  ? BUN 22 07/30/2021 0416  ? BUN 21 12/04/2020 0824  ? CREATININE 0.75 07/30/2021 0416  ? CREATININE 0.79 03/21/2016 1116  ? CALCIUM 9.3 07/30/2021 0416  ? GFRNONAA >60 07/30/2021 0416  ? GFRAA >60 06/26/2019 0457  ? ? ?INR ?   ?Component Value Date/Time  ? INR 1.0 07/24/2021 1600  ? INR 4.3 06/06/2010 1010  ? ? ? ?Intake/Output Summary (Last 24 hours)  at 07/30/2021 0735 ?Last data filed at 07/30/2021 0703 ?Gross per 24 hour  ?Intake 514.22 ml  ?Output 1200 ml  ?Net -685.78 ml  ? ? ? ?Assessment/Plan:  88 y.o. female is s/p Left CFA endarterectomy 4 Days Post-Op  ? ?- left groin looks good c/d/I without swelling or hematoma ?- pt continues to have brisk left PT doppler signal. Palpable right Dp, doppler PT ?-PAF- appreciate cardiology assistance. Started Amio gtt, on Eliquis. HR's remain in 130-140's ?-Hyperglycemic. Pt was on Metformin 1000mg  bid prior to admission. Will resume home regimen of Metformin at 1000 mg BID ?- TOC assisting with SNF placement/auth ? ?  ?DVT prophylaxis:  Eliquis ? ?Karoline Caldwell, PA-C ?[facility] ?[phone] ?07/30/2021 ?7:35 AM ?

## 2021-07-30 NOTE — Progress Notes (Signed)
? ?Progress Note ? ?Patient Name: [name] ?Date of Encounter: 07/30/2021 ? ?[facility] Cardiologist: Olga Millers, MD  ? ?Subjective  ? ?Denies any chest pain or shortness of breath.  No palpitations.  Heart rate remains in the 130s in atrial fibrillation with RVR despite IV amio drip ? ?Inpatient Medications  ?  ?Scheduled Meds: ? apixaban  2.5 mg Oral BID  ? atorvastatin  40 mg Oral Daily  ? docusate sodium  100 mg Oral Daily  ? ezetimibe  10 mg Oral Daily  ? feeding supplement  237 mL Oral BID BM  ? glipiZIDE  10 mg Oral BID AC  ? insulin aspart  0-9 Units Subcutaneous TID WC  ? insulin glargine-yfgn  10 Units Subcutaneous QHS  ? lisinopril  20 mg Oral Daily  ? metFORMIN  1,000 mg Oral BID WC  ? metoprolol tartrate  50 mg Oral BID  ? pantoprazole  40 mg Oral Daily  ? ?Continuous Infusions: ? sodium chloride    ? amiodarone 30 mg/hr (07/30/21 0414)  ? magnesium sulfate bolus IVPB    ? ?PRN Meds: ?sodium chloride, acetaminophen **OR** acetaminophen, alum & mag hydroxide-simeth, guaiFENesin-dextromethorphan, hydrALAZINE, magnesium sulfate bolus IVPB, morphine injection, ondansetron, oxyCODONE-acetaminophen, phenol, potassium chloride  ? ?Vital Signs  ?  ?Vitals:  ? 07/29/21 1630 07/29/21 2000 07/30/21 0400 07/30/21 0600  ?BP: 132/86 133/78  128/82  ?Pulse: 79 (!) 135  (!) 138  ?Resp: 20 18 18 19   ?Temp: 98.5 ?F (36.9 ?C) 98.7 ?F (37.1 ?C) 98.5 ?F (36.9 ?C)   ?TempSrc: Oral Oral Oral   ?SpO2: 95% 96%  95%  ?Weight:      ?Height:      ? ? ?Intake/Output Summary (Last 24 hours) at 07/30/2021 0754 ?Last data filed at 07/30/2021 0703 ?Gross per 24 hour  ?Intake 514.22 ml  ?Output 1200 ml  ?Net -685.78 ml  ? ? ? ?  07/29/2021  ?  4:00 AM 07/26/2021  ?  6:09 AM 07/24/2021  ?  3:11 PM  ?Last 3 Weights  ?Weight (lbs) 83 lb 5.3 oz 84 lb 1.6 oz 84 lb 1.6 oz  ?Weight (kg) 37.8 kg 38.148 kg 38.148 kg  ?   ? ?Telemetry  ?  ?Atrial fibrillation with RVR- Personally Reviewed ? ?ECG  ?  ?N/a - Personally  Reviewed ? ?Physical Exam  ? ?GEN: thin and cachectic appearing ?HEENT: Normal ?NECK: No JVD; No carotid bruits ?LYMPHATICS: No lymphadenopathy ?CARDIAC: Irregularly irregular and tachycardic, no murmurs, rubs, gallops ?RESPIRATORY:  Clear to auscultation without rales, wheezing or rhonchi  ?ABDOMEN: Soft, non-tender, non-distended ?MUSCULOSKELETAL:  No edema; No deformity  ?SKIN: Warm and dry ?NEUROLOGIC:  Alert and oriented x 3 ?PSYCHIATRIC:  Normal affect   ?Labs  ?  ?High Sensitivity Troponin:  No results for input(s): TROPONINIHS in the last 720 hours.   ?Chemistry ?Recent Labs  ?Lab 07/24/21 ?1600 07/26/21 ?1131 07/27/21 ?0133 07/30/21 ?0416  ?NA 139  --  135 138  ?K 4.1  --  3.8 3.9  ?CL 101  --  100 101  ?CO2 24  --  27 28  ?GLUCOSE 226*  --  263* 158*  ?BUN 13  --  16 22  ?CREATININE 0.84 0.72 0.84 0.75  ?CALCIUM 9.9  --  8.8* 9.3  ?MG  --   --   --  1.6*  ?PROT 7.7  --   --   --   ?ALBUMIN 4.6  --   --   --   ?  AST 39  --   --   --   ?ALT 45*  --   --   --   ?ALKPHOS 65  --   --   --   ?BILITOT 0.7  --   --   --   ?GFRNONAA >60 >60 >60 >60  ?ANIONGAP 14  --  8 9  ? ?  ?Lipids No results for input(s): CHOL, TRIG, HDL, LABVLDL, LDLCALC, CHOLHDL in the last 168 hours.  ?Hematology ?Recent Labs  ?Lab 07/24/21 ?1600 07/26/21 ?1131 07/27/21 ?0133  ?WBC 6.4 9.2 8.7  ?RBC 3.77* 3.37* 3.26*  ?HGB 13.3 11.9* 11.5*  ?HCT 40.2 35.4* 34.0*  ?MCV 106.6* 105.0* 104.3*  ?MCH 35.3* 35.3* 35.3*  ?MCHC 33.1 33.6 33.8  ?RDW 14.6 14.2 14.1  ?PLT 219 174 185  ? ? ?Thyroid No results for input(s): TSH, FREET4 in the last 168 hours.  ?BNPNo results for input(s): BNP, PROBNP in the last 168 hours.  ?DDimer No results for input(s): DDIMER in the last 168 hours.  ? ?Radiology  ?  ?No results found. ? ?Cardiac Studies  ? ? ? ?Patient Profile  ?   ?[name] is a 88 y.o. female with a hx of afibb on eliquis, DM2, DVTs, HTN, HLD, Peripheral arterial disease who is being seen 07/28/2021 for the evaluation of Afib RVR at the request  of Dr. Karin Lieu.  ? ?Assessment & Plan  ?  ?1.PAF (SR on admission EKG) ?- issues with RVR this admit post op left common fem endaretctomy ?- HRs to 150s over the weekend received IV lopressor and then started on dilt gtt. Now on drip at 15, oral lopressor 50mg  bid.  ?- home regimen is lopressor 50mg  bid, diltaizem 240mg  daily ?- Heart rates remain elevated in the 130s in A-fib ?- she is on eliquis for stroke prevention, held for recent leg surgyer but back on.   ?- on high dose dilt gtt, oral lopressor. Rates remain elevated, bp's on the soft side.  ?-Recommend TEE cardioversion tomorrow since we cannot get her heart rate controlled on IV amiodarone, p.o. Lopressor and IV Dilt ?-will make n.p.o. after midnight- rehceck electrolytes with AM labs ?-Shared Decision Making/Informed Consent now I did not ?The risks [stroke, cardiac arrhythmias rarely resulting in the need for a temporary or permanent pacemaker, skin irritation or burns, esophageal damage, perforation (1:10,000 risk), bleeding, pharyngeal hematoma as well as other potential complications associated with conscious sedation including aspiration, arrhythmia, respiratory failure and death], benefits (treatment guidance, restoration of normal sinus rhythm, diagnostic support) and alternatives of a transesophageal echocardiogram guided cardioversion were discussed in detail with Ms. Hammer and she is willing to proceed.  ?-continue apixaban 2.5mg  BID (dosed for age>80 and wt < 60kg) ?-continue IV Amio for now, Lopressor 50mg  BID ?-restart IV Cardizem gtt at 5mg /hr with 10mg  IV bolus ? ?2.PAD ?Left common femoral endarterectomy this admission ? ?For questions or updates, please contact [facility] ?Please consult [email or web address] for contact info under  ? ?  ?   ?Signed, ? , MD  ?07/30/2021, 7:54 AM   ? ?

## 2021-07-30 NOTE — Progress Notes (Signed)
Mobility Specialist Progress Note ? ? 07/30/21 1307  ?Mobility  ?Activity Transferred from bed to chair  ?Level of Assistance +2 (takes two people)  ?Assistive Device Front wheel walker  ?Distance Ambulated (ft) 2 ft  ?Activity Response Tolerated fair  ?$Mobility charge 1 Mobility  ? ?Pre Mobility: 86 HR, 98%SpO2 ?During Mobility: 97 HR, 86% SpO2 ?Post Mobility: 88 HR, 97% SpO2 ? ?RN requesting assistance to get pt to chair for lunch. Pt presented w/ gen weakness and dec pain tolerance in UE's, pt requiring +2 assistance throughout. Left in chair w/ RN in room. ? ?Holland Falling ?Mobility Specialist ?Phone Number [phone] ? ?

## 2021-07-30 NOTE — Progress Notes (Signed)
Initial Nutrition Assessment ? ?DOCUMENTATION CODES:  ? ?Severe malnutrition in context of chronic illness, Underweight ? ?INTERVENTION:  ? ?Continue to offer Ensure Enlive po TID, each supplement provides 350 kcal and 20 grams of protein. ? ?MVI with minerals daily. ? ?NUTRITION DIAGNOSIS:  ? ?Severe Malnutrition related to chronic illness (stroke, PAD, DM) as evidenced by severe muscle depletion, severe fat depletion, percent weight loss (8% weight loss x 3 months). ? ?GOAL:  ? ?Patient will meet greater than or equal to 90% of their needs ? ?MONITOR:  ? ?PO intake, Supplement acceptance, Skin, Labs ? ?REASON FOR ASSESSMENT:  ? ?Malnutrition Screening Tool ?  ? ?ASSESSMENT:  ? ?88 yo female admitted with critical limb ischemia (left leg). S/P left common femoral endarterectomy and patch angioplasty 4/13. PMH includes HTN, DM, HLD, A fib, arthritis, PAD, stroke. ? ?Patient unable to provide any nutrition hx. She was sitting up in her chair, but sleeping. She did not answer any of RD's questions. Patient is being offered Ensure supplements BID. Patient has not been drinking them today.  ? ?Labs reviewed. Phos 5.2, mag 1.6, A1C 7.3 ?CBG: 249-296 ? ?Medications reviewed and include Colace, Glucotrol, Novolog, Semglee, Glucophage, Protonix, mag sulfate prn. 8% weight loss within the past 3 months is significant. ? ?NUTRITION - FOCUSED PHYSICAL EXAM: ? ?Flowsheet Row Most Recent Value  ?Orbital Region Severe depletion  ?Upper Arm Region Unable to assess  ?Thoracic and Lumbar Region Severe depletion  ?Buccal Region Severe depletion  ?Temple Region Moderate depletion  ?Clavicle Bone Region Severe depletion  ?Clavicle and Acromion Bone Region Severe depletion  ?Scapular Bone Region Severe depletion  ?Dorsal Hand Severe depletion  ?Patellar Region Severe depletion  ?Anterior Thigh Region Severe depletion  ?Posterior Calf Region Severe depletion  ?Edema (RD Assessment) None  ?Hair Reviewed  ?Eyes Reviewed  ?Mouth Reviewed   ?Skin Reviewed  ?Nails Reviewed  ? ?  ? ? ?Diet Order:   ?Diet Order   ? ?       ?  Diet Carb Modified Fluid consistency: Thin; Room service appropriate? Yes  Diet effective now       ?  ? ?  ?  ? ?  ? ? ?EDUCATION NEEDS:  ? ?Not appropriate for education at this time ? ?Skin:  Skin Assessment: Reviewed RN Assessment ? ?Last BM:  no BM documented ? ?Height:  ? ?Ht Readings from Last 1 Encounters:  ?07/26/21 4\' 11"  (1.499 m)  ? ? ?Weight:  ? ?Wt Readings from Last 1 Encounters:  ?07/29/21 37.8 kg  ? ? ?BMI:  Body mass index is 16.83 kg/m?. ? ?Estimated Nutritional Needs:  ? ?Kcal:  1300-1500 ? ?Protein:  55-65 gm ? ?Fluid:  1.3-1.5 L ? ? ? ?Lucas Mallow RD, LDN, CNSC ?Please refer to Amion for contact information.                                                       ? ?

## 2021-07-31 ENCOUNTER — Other Ambulatory Visit (HOSPITAL_COMMUNITY): Payer: Medicare HMO

## 2021-07-31 ENCOUNTER — Inpatient Hospital Stay (HOSPITAL_COMMUNITY): Payer: Medicare HMO

## 2021-07-31 ENCOUNTER — Encounter (HOSPITAL_COMMUNITY)
Admission: RE | Disposition: A | Discharge status: Skilled Nursing Facility | Payer: Self-pay | Source: Home / Self Care | Attending: Vascular Surgery

## 2021-07-31 DIAGNOSIS — E43 Unspecified severe protein-calorie malnutrition: Secondary | ICD-10-CM | POA: Diagnosis present

## 2021-07-31 DIAGNOSIS — I4892 Unspecified atrial flutter: Secondary | ICD-10-CM | POA: Diagnosis not present

## 2021-07-31 DIAGNOSIS — I70222 Atherosclerosis of native arteries of extremities with rest pain, left leg: Secondary | ICD-10-CM | POA: Diagnosis not present

## 2021-07-31 DIAGNOSIS — F0150 Vascular dementia without behavioral disturbance: Secondary | ICD-10-CM | POA: Diagnosis present

## 2021-07-31 LAB — CBC WITH DIFFERENTIAL/PLATELET
Abs Immature Granulocytes: 0.04 10*3/uL (ref 0.00–0.07)
Basophils Absolute: 0 10*3/uL (ref 0.0–0.1)
Basophils Relative: 0 %
Eosinophils Absolute: 0 10*3/uL (ref 0.0–0.5)
Eosinophils Relative: 0 %
HCT: 34 % — ABNORMAL LOW (ref 36.0–46.0)
Hemoglobin: 11.5 g/dL — ABNORMAL LOW (ref 12.0–15.0)
Immature Granulocytes: 0 %
Lymphocytes Relative: 9 %
Lymphs Abs: 0.9 10*3/uL (ref 0.7–4.0)
MCH: 35.3 pg — ABNORMAL HIGH (ref 26.0–34.0)
MCHC: 33.8 g/dL (ref 30.0–36.0)
MCV: 104.3 fL — ABNORMAL HIGH (ref 80.0–100.0)
Monocytes Absolute: 1.5 10*3/uL — ABNORMAL HIGH (ref 0.1–1.0)
Monocytes Relative: 15 %
Neutro Abs: 7.5 10*3/uL (ref 1.7–7.7)
Neutrophils Relative %: 76 %
Platelets: 174 10*3/uL (ref 150–400)
RBC: 3.26 MIL/uL — ABNORMAL LOW (ref 3.87–5.11)
RDW: 14 % (ref 11.5–15.5)
WBC: 9.9 10*3/uL (ref 4.0–10.5)
nRBC: 0 % (ref 0.0–0.2)

## 2021-07-31 LAB — GLUCOSE, CAPILLARY
Glucose-Capillary: 354 mg/dL — ABNORMAL HIGH (ref 70–99)
Glucose-Capillary: 210 mg/dL — ABNORMAL HIGH (ref 70–99)
Glucose-Capillary: 223 mg/dL — ABNORMAL HIGH (ref 70–99)
Glucose-Capillary: 260 mg/dL — ABNORMAL HIGH (ref 70–99)

## 2021-07-31 LAB — PROTIME-INR
INR: 1.3 — ABNORMAL HIGH (ref 0.8–1.2)
Prothrombin Time: 15.8 seconds — ABNORMAL HIGH (ref 11.4–15.2)

## 2021-07-31 SURGERY — ECHOCARDIOGRAM, TRANSESOPHAGEAL
Anesthesia: Monitor Anesthesia Care

## 2021-07-31 MED ORDER — DILTIAZEM HCL 60 MG PO TABS
90.0000 mg | ORAL_TABLET | Freq: Four times a day (QID) | ORAL | Status: DC
  Administered 2021-07-31 – 2021-08-01 (×2): 90 mg via ORAL
  Filled 2021-07-31 (×2): qty 2

## 2021-07-31 MED ORDER — TRAMADOL HCL 50 MG PO TABS
50.0000 mg | ORAL_TABLET | Freq: Once | ORAL | Status: AC
  Administered 2021-07-31: 50 mg via ORAL
  Filled 2021-07-31: qty 1

## 2021-07-31 MED ORDER — AMIODARONE HCL 200 MG PO TABS
200.0000 mg | ORAL_TABLET | Freq: Two times a day (BID) | ORAL | Status: DC
  Administered 2021-07-31 – 2021-08-08 (×17): 200 mg via ORAL
  Filled 2021-07-31 (×17): qty 1

## 2021-07-31 MED ORDER — DILTIAZEM HCL 60 MG PO TABS: 90.0000 mg | ORAL_TABLET | Freq: Four times a day (QID) | ORAL | Status: DC

## 2021-07-31 MED ORDER — DILTIAZEM HCL 60 MG PO TABS
60.0000 mg | ORAL_TABLET | Freq: Four times a day (QID) | ORAL | Status: DC
  Administered 2021-07-31 (×2): 60 mg via ORAL
  Filled 2021-07-31 (×2): qty 1

## 2021-07-31 MED ORDER — SODIUM CHLORIDE 0.9 % IV SOLN: INTRAVENOUS | Status: DC

## 2021-07-31 MED ORDER — HALOPERIDOL LACTATE 5 MG/ML IJ SOLN: 2.0000 mg | Freq: Four times a day (QID) | INTRAMUSCULAR | Status: DC | PRN

## 2021-07-31 MED ORDER — INSULIN GLARGINE-YFGN 100 UNIT/ML ~~LOC~~ SOLN
12.0000 [IU] | Freq: Every day | SUBCUTANEOUS | Status: DC
  Administered 2021-07-31 – 2021-08-01 (×2): 12 [IU] via SUBCUTANEOUS
  Filled 2021-07-31 (×3): qty 0.12

## 2021-07-31 NOTE — Progress Notes (Addendum)
? ?Progress Note ? ?Patient Name: [name] ?Date of Encounter: 07/31/2021 ? ?[facility] Cardiologist: Kirk Ruths, MD  ? ?Subjective  ?Confused this am when down for TEE/DCCV which was cancelled as she could not communicate and HR was much better controlled.  Son in room and says that she is confused in the am when she gets up.  Seems appropriate now and denies any complaints. ? ?Inpatient Medications  ?  ?Scheduled Meds: ? [MAR Hold] apixaban  2.5 mg Oral BID  ? [MAR Hold] atorvastatin  40 mg Oral Daily  ? [MAR Hold] docusate sodium  100 mg Oral Daily  ? [MAR Hold] ezetimibe  10 mg Oral Daily  ? [MAR Hold] feeding supplement  237 mL Oral TID BM  ? [MAR Hold] glipiZIDE  10 mg Oral BID AC  ? [MAR Hold] insulin aspart  0-9 Units Subcutaneous TID WC  ? [MAR Hold] insulin glargine-yfgn  10 Units Subcutaneous QHS  ? [MAR Hold] lisinopril  20 mg Oral Daily  ? [MAR Hold] metFORMIN  1,000 mg Oral BID WC  ? [MAR Hold] metoprolol tartrate  50 mg Oral BID  ? [MAR Hold] multivitamin with minerals  1 tablet Oral Daily  ? [MAR Hold] pantoprazole  40 mg Oral Daily  ? ?Continuous Infusions: ? [MAR Hold] sodium chloride    ? sodium chloride    ? amiodarone 30 mg/hr (07/31/21 0046)  ? [MAR Hold] diltiazem (CARDIZEM) infusion 15 mg/hr (07/30/21 1416)  ? [MAR Hold] magnesium sulfate bolus IVPB    ? ?PRN Meds: ?[MAR Hold] sodium chloride, [MAR Hold] acetaminophen **OR** [MAR Hold] acetaminophen, [MAR Hold] alum & mag hydroxide-simeth, [MAR Hold] guaiFENesin-dextromethorphan, [MAR Hold] hydrALAZINE, [MAR Hold] magnesium sulfate bolus IVPB, [MAR Hold]  morphine injection, [MAR Hold] ondansetron, [MAR Hold] oxyCODONE-acetaminophen, [MAR Hold] phenol, [MAR Hold] potassium chloride  ? ?Vital Signs  ?  ?Vitals:  ? 07/30/21 1705 07/30/21 2012 07/30/21 2322 07/31/21 0358  ?BP: (!) 123/91 (!) 148/76 107/62 (!) 87/47  ?Pulse: (!) 107 (!) 128 92 60  ?Resp: 20 16 17 20   ?Temp:  98.3 ?F (36.8 ?C) 98.7 ?F (37.1 ?C) 97.9 ?F (36.6 ?C)   ?TempSrc:  Oral Oral Axillary  ?SpO2: 96% 96% 94% 96%  ?Weight:      ?Height:      ? ? ?Intake/Output Summary (Last 24 hours) at 07/31/2021 0747 ?Last data filed at 07/31/2021 [id number] ?Gross per 24 hour  ?Intake 828.63 ml  ?Output --  ?Net 828.63 ml  ? ? ? ?  07/29/2021  ?  4:00 AM 07/26/2021  ?  6:09 AM 07/24/2021  ?  3:11 PM  ?Last 3 Weights  ?Weight (lbs) 83 lb 5.3 oz 84 lb 1.6 oz 84 lb 1.6 oz  ?Weight (kg) 37.8 kg 38.148 kg 38.148 kg  ?   ? ?Telemetry  ?  ?Atrial flutter with variable AV block heart rate 59 bpm- Personally Reviewed ? ?ECG  ?  ?Atrial flutter with variable AV block heart rate 59 bpm- Personally Reviewed ? ?Physical Exam  ? ?GEN: thin cachectic appearing ?HEENT: Normal ?NECK: No JVD; No carotid bruits ?LYMPHATICS: No lymphadenopathy ?CARDIAC: Irregularly irregular, no murmurs, rubs, gallops ?RESPIRATORY:  Clear to auscultation without rales, wheezing or rhonchi  ?ABDOMEN: Soft, non-tender, non-distended ?MUSCULOSKELETAL:  No edema; No deformity  ?SKIN: Warm and dry ?NEUROLOGIC:  Alert and oriented x 3 ?PSYCHIATRIC:  Normal affect   ?Labs  ?  ?High Sensitivity Troponin:  No results for input(s): TROPONINIHS in the last 720  hours.   ?Chemistry ?Recent Labs  ?Lab 07/24/21 ?1600 07/26/21 ?1131 07/27/21 ?0133 07/30/21 ?0416  ?NA 139  --  135 138  ?K 4.1  --  3.8 3.9  ?CL 101  --  100 101  ?CO2 24  --  27 28  ?GLUCOSE 226*  --  263* 158*  ?BUN 13  --  16 22  ?CREATININE 0.84 0.72 0.84 0.75  ?CALCIUM 9.9  --  8.8* 9.3  ?MG  --   --   --  1.6*  ?PROT 7.7  --   --   --   ?ALBUMIN 4.6  --   --   --   ?AST 39  --   --   --   ?ALT 45*  --   --   --   ?ALKPHOS 65  --   --   --   ?BILITOT 0.7  --   --   --   ?GFRNONAA >60 >60 >60 >60  ?ANIONGAP 14  --  8 9  ? ?  ?Lipids No results for input(s): CHOL, TRIG, HDL, LABVLDL, LDLCALC, CHOLHDL in the last 168 hours.  ?Hematology ?Recent Labs  ?Lab 07/27/21 ?0133 07/30/21 ?0416 07/31/21 ?0246  ?WBC 8.7 9.6 9.9  ?RBC 3.26* 3.39* 3.26*  ?HGB 11.5* 12.0 11.5*  ?HCT 34.0* 35.4*  34.0*  ?MCV 104.3* 104.4* 104.3*  ?MCH 35.3* 35.4* 35.3*  ?MCHC 33.8 33.9 33.8  ?RDW 14.1 14.1 14.0  ?PLT 185 174 174  ? ? ?Thyroid No results for input(s): TSH, FREET4 in the last 168 hours.  ?BNPNo results for input(s): BNP, PROBNP in the last 168 hours.  ?DDimer No results for input(s): DDIMER in the last 168 hours.  ? ?Radiology  ?  ?No results found. ? ?Cardiac Studies  ? ? ? ?Patient Profile  ?   ?[name] is a 88 y.o. female with a hx of afibb on eliquis, DM2, DVTs, HTN, HLD, Peripheral arterial disease who is being seen 07/28/2021 for the evaluation of Afib RVR at the request of Dr. Virl Cagey.  ? ?Assessment & Plan  ?  ?1.PAF (SR on admission EKG) ?- issues with RVR this admit post op left common fem endarterectomy ?- HRs to 150s over the weekend received IV lopressor and then started on dilt gtt and then started on IV amiodarone drip ?- home regimen is lopressor 50mg  bid, diltaizem 240mg  daily ?- she is on eliquis for stroke prevention, held for recent leg surgery but back on now ?-HR much improved today and given her MS changes earlier this am and son's request to only do TEE/DCCV if we could not get HR controlled>>her TEE/DCCV have been cancelled. ?-continue apixaban 2.5mg  BID (dosed for age>80 and wt < 60kg) ?-Change amiodarone to 200 mg twice daily and stop IV gtt ?- continue Lopressor 50 mg twice daily  ?-stop IV Cardizem gtt and start Cardizem 60mg  q6hrs (currently on gtt at 15mg /hr but SBP in the upper 100's so will start lower)) ?-if HR remains controlled on oral meds then will wait 4 weeks and plan DCCV once she recovers from acute illness ? ?2.PAD ?Left common femoral endarterectomy this admission ? ?For questions or updates, please contact [place] ?Please consult [email or web address] for contact info under  ? ?  ?   ?Signed, ?Fransico Him, MD  ?07/31/2021, 7:47 AM   ? ?

## 2021-07-31 NOTE — Assessment & Plan Note (Signed)
-  Management per vascular surgery ?-s/p femoral endarterectomy ?

## 2021-07-31 NOTE — Assessment & Plan Note (Addendum)
-  Patient with neuropsych testing in 02/2021 that showed "significant and severe deficits in all cognitive domains assessed...  ?The level of impairment noted will strongly indicate the patient will need daily care and observation and is really functional level that would make living independent even if she is regularly checked on a safety concern. ?The patient should not be driving at all and should avoid day-to-day activities where there is particular risk of significant harm or danger when attention, visual-spatial or memory functions are needed for safe execution of the task." ?-She has had waxing and waning mental status here that is clearly worse than her baseline, which would be expected outside of her home environment from hospital-associated delirium. ?-Further, she has had significant physical decline since admission and appears likely to need SNF rehab. ?-Will add delirium precautions ?-Will add prn Haldol, although agitation has not been a significant issue to date ?-I would not pursue additional infectious work-up at this time ?

## 2021-07-31 NOTE — Progress Notes (Addendum)
Patient presented for TEE/DCCV for Afib.  She is somnolent but arousable, oriented to person only.  I discussed with Dr Mayford Knife, and this is a change in her mental status from yesterday.  She remains in Afib, but rates are improved in 80-90s currently.  D/w Dr Mayford Knife and will hold off on TEE/DCCV for now given change in mental status and improvement in her rate control.  I spoke with her son Kendell Bane who has also noted she has been more confused and was in agreement with holding off on the procedure for now. ? ?Little Ishikawa, MD ? ?

## 2021-07-31 NOTE — Consult Note (Signed)
Initial Consultation Note ? ? ?Patient: [name] [id number] DOB: [birth year] PCP: Lucianne Lei, MD ?DOA: 07/26/2021 ?DOS: the patient was seen and examined on 07/31/2021 ?Primary service: Broadus John, MD ? ?Referring physician: Virl Cagey - vascular ?Reason for consult: Admitted last week for femoral endarterectomy.  L-sided weakness post-operatively, negative stroke work-up, neuro has signed off.  Post-op afib with RVR, cards planned TEE with cardioversion today but AMS.  Negative UA, no narcotics.  Unsure why AMS.  CXR pending. ? ? ? ?Assessment and Plan: ?* Critical limb ischemia of left lower extremity ([place]) ?-Management per vascular surgery ?-s/p femoral endarterectomy ? ?Vascular dementia with delirium ([place]) ?-Patient with neuropsych testing in 02/2021 that showed "significant and severe deficits in all cognitive domains assessed...   The level of impairment noted will strongly indicate the patient will need daily care and observation and is really functional level that would make living independent even if she is regularly checked on a safety concern.  The patient should not be driving at all and should avoid day-to-day activities where there is particular risk of significant harm or danger when attention, visual-spatial or memory functions are needed for safe execution of the task." ?-She has had waxing and waning mental status here that is clearly worse than her baseline, which would be expected outside of her home environment from hospital-associated delirium. ?-Further, she has had significant physical decline since admission and appears likely to need SNF rehab. ?-Will add delirium precautions ?-Will add prn Haldol, although agitation has not been a significant issue to date ?-I would not pursue additional infectious work-up at this time ? ?Protein-calorie malnutrition, severe ?-Body mass index is 16.83 kg/m?Marland Kitchen.  ?-The patient has at least 2 indicators for malnutrition (insufficient energy  intake, weight loss, loss of muscle mass, diminished functional status/handgrip strength). ?-Will obtain a nutrition consult for further recommendations. ? ?ATRIAL FIBRILLATION ?-Cardiology is managing ?-She is on Dilt and Amio drips ?-She was planned for TEE/DCCV today but this was canceled due to AMS ?-Will defer further management to cardiology ? ?Essential hypertension ?-Continue lopressor, Lisinopril ? ?Type 2 diabetes mellitus with hyperlipidemia ([place]) ?-She is continuing to have hyperglycemia despite Semglee 10 - will increase to 12 units ?-Continue to cover with sensitive-scale SSI ?-Stop Glucotrol due to elevated risk of hypoglycemia ?-Continue metformin ?-DM coordinator is following, as well ? ? ? ? ? ?TR[facility] will continue to follow this patient. ? ? ?HPI: [name] is a 88 y.o. female with past medical history of afib on Eliquis; DM; HTN; HLD; and CVA (1999) who was admitted on 4/13 with critical limb ischemia of the left leg.  She underwent left common femoral endarterectomy and it was an uncomplicated procedure.  She has been slow to mobilize post-procedure and also developed RVR; she is currently on Amio and Cardizem drips.  She has never been diagnosed with dementia but per son's history she has been increasingly confused at home, hasn't known the date in over a year, has intermittent bouts of severe confusion but those have been fleeting.  While hospitalized, she has been even more confused than usual and so when she went for TEE/DCCV this AM it was canceled.  She has R forearm pain - apparently from Amio IV infiltration. ? ? ?Review of Systems: As mentioned in the history of present illness. All other systems reviewed and are negative.  Limited by delirium. ?Past Medical History:  ?Diagnosis Date  ? Arthritis   ? Atrial fibrillation ([place])   ?  Diabetes mellitus   ? DVT (deep venous thrombosis) ([place])   ? Hyperlipidemia   ? Hypertension   ? Leg pain   ? Peripheral arterial disease ([place])   ? Stroke  [facility]) 1999  ? ?Past Surgical History:  ?Procedure Laterality Date  ? ABDOMINAL AORTOGRAM W/LOWER EXTREMITY N/A 07/11/2021  ? Procedure: ABDOMINAL AORTOGRAM W/LOWER EXTREMITY;  Surgeon: Broadus John, MD;  Location: [facility];  Service: Cardiovascular;  Laterality: N/A;  ? [place]  ? CARDIOVERSION N/A 02/15/2020  ? Procedure: CARDIOVERSION;  Surgeon: Thayer Headings, MD;  Location: [facility];  Service: Cardiovascular;  Laterality: N/A;  ? CATARACT EXTRACTION  2000  ? CESAREAN SECTION    ? ENDARTERECTOMY FEMORAL Left 07/26/2021  ? Procedure: LEFT COMMON FEMORAL ENDARTERECTOMY;  Surgeon: Broadus John, MD;  Location: [facility];  Service: Vascular;  Laterality: Left;  ? EYE SURGERY    ? FEMORAL BYPASS Left July 23, 2010  ? LUMBAR DISC SURGERY    ? PATCH ANGIOPLASTY Left 07/26/2021  ? Procedure: PATCH ANGIOPLASTY WITH XENOSURE BIOLOGIC 1 cm x 6 cm PATCH;  Surgeon: Broadus John, MD;  Location: [facility];  Service: Vascular;  Laterality: Left;  ? SPINE SURGERY    ? ?Social History:  reports that she has never smoked. She has never used smokeless tobacco. She reports that she does not drink alcohol and does not use drugs. ? ?No Known Allergies ? ?Family History  ?Problem Relation Age of Onset  ? Hypertension Mother   ? Heart attack Mother   ? Diabetes Father   ? Hypertension Father   ? Stroke Father   ? Stroke Sister   ? Diabetes Sister   ? Heart disease Sister   ?     Before age 30-Aneurysm  ? Dementia Sister   ? Alzheimer's disease Sister   ? Diabetes Brother   ? Diabetes Brother   ? Heart disease Brother   ?     Before age 11  ? Heart attack Brother   ? Heart attack Sister   ? Diabetes Sister   ? Alzheimer's disease Sister   ? ? ?Prior to Admission medications   ?Medication Sig Start Date End Date Taking? Authorizing Provider  ?acetaminophen (TYLENOL) 650 MG CR tablet Take 1,300 mg by mouth every 8 (eight) hours as needed for pain.   Yes [provider]  ?atorvastatin (LIPITOR) 40 MG tablet  Take 1 tablet (40 mg total) by mouth daily. 03/22/20 07/26/21 Yes Cleaver, Jossie Ng, NP  ?diclofenac Sodium (VOLTAREN) 1 % GEL Apply 2 g topically 2 (two) times daily as needed (pain).   Yes [provider]  ?diltiazem (CARDIZEM CD) 240 MG 24 hr capsule Take 1 capsule (240 mg total) by mouth daily. 11/23/20 07/26/21 Yes Crenshaw, Denice Bors, MD  ?ELIQUIS 2.5 MG TABS tablet TAKE 1 TABLET BY MOUTH TWICE A DAY 06/07/21  Yes Lelon Perla, MD  ?ezetimibe (ZETIA) 10 MG tablet Take 1 tablet (10 mg total) by mouth daily. 12/05/20 07/26/21 Yes Lelon Perla, MD  ?Ginkgo Biloba 60 MG CAPS Take 60 mg by mouth 2 (two) times daily.   Yes [provider]  ?glipiZIDE (GLUCOTROL) 10 MG tablet Take 10 mg by mouth 2 (two) times daily before a meal.   Yes [provider]  ?LANTUS SOLOSTAR 100 UNIT/ML Solostar Pen Inject 10 Units into the skin at bedtime. 02/15/21  Yes [provider]  ?latanoprost (XALATAN) 0.005 % ophthalmic solution Place  1 drop into both eyes daily.   Yes [provider]  ?lisinopril (ZESTRIL) 20 MG tablet Take 1 tablet (20 mg total) by mouth daily. 11/23/20  Yes Lelon Perla, MD  ?metFORMIN (GLUCOPHAGE) 500 MG tablet Take 1,000 mg by mouth 2 (two) times daily. 06/27/21  Yes [provider]  ?metoprolol tartrate (LOPRESSOR) 50 MG tablet Take 1 tablet po twice daily 06/28/21  Yes Crenshaw, Denice Bors, MD  ?Multiple Vitamin (MULTIVITAMIN) tablet Take 1 tablet by mouth daily. Centrum silver   Yes [provider]  ?Omega-3 Fatty Acids (FISH OIL) 1200 MG CAPS Take 1,200 mg by mouth daily.   Yes [provider]  ?ACCU-CHEK SOFTCLIX LANCETS lancets  01/16/18   [provider]  ?GVOKE HYPOPEN 2-PACK 0.5 MG/0.1ML SOAJ Inject 1 Dose into the skin as needed (hypoglycemia). 01/23/21   [provider]  ?traMADol (ULTRAM) 50 MG tablet Take 1 tablet (50 mg total) by mouth every 6 (six) hours as needed. ?Patient not taking: Reported on 07/06/2021  04/29/21   Margarita Mail, PA-C  ?iron polysaccharides (NIFEREX) 150 MG capsule Take 150 mg by mouth daily.    07/04/11  [provider]  ?topiramate (TOPAMAX) 15 MG capsule Take 15 mg by mouth 2 (t

## 2021-07-31 NOTE — Assessment & Plan Note (Signed)
-  Continue lopressor, Lisinopril ?

## 2021-07-31 NOTE — Assessment & Plan Note (Signed)
-  Cardiology is managing ?-She is on Dilt and Amio drips ?-She was planned for TEE/DCCV today but this was canceled due to AMS ?-Will defer further management to cardiology ?

## 2021-07-31 NOTE — Progress Notes (Signed)
Occupational Therapy Treatment ?Patient Details ?Name: [name] ?MRN: [id number] ?DOB: 02-19-1933 ?Today's Date: 07/31/2021 ? ? ?History of present illness 88 y.o. female who presented 07/26/21 for L CFA endarterectomy secondary to [place] 4 critical limb ischemia in the left leg. Code Stroke called after surgery when pt was disoriented and with L facial droop. MRI of head was negative with neuro signing off and indicating she would benefit from an outpatient dementia evaluation. Now she is in Afib RV/Aflutter. PMH: afib, DM, DVT, HTN, HLD, PAD, CVA. ?  ?OT comments ? Session focused on progression of OOB transfers, sequencing basic tasks and assessment of UE function. Pt with optimal functioning when provided slow, simple directions and encouragement. Pt able to demo insight into posterior/anterior leans sitting EOB but difficulty correcting without physical assist. Pt requires extensive assist for basic transfers with consistent step by step cueing needed to complete. Provided squeeze ball to improve R hand grasp and encouragement pt to move extremities often within tolerance. Pt's son present, hands on to assist and engaged throughout in pt's care. Continue to rec SNF rehab at DC as pt is below functional baseline.  ? ?HR 60s-low 100s throughout  ? ?Recommendations for follow up therapy are one component of a multi-disciplinary discharge planning process, led by the attending physician.  Recommendations may be updated based on patient status, additional functional criteria and insurance authorization. ?   ?Follow Up Recommendations ? Skilled nursing-short term rehab (<3 hours/day)  ?  ?Assistance Recommended at Discharge Frequent or constant Supervision/Assistance  ?Patient can return home with the following ? A lot of help with walking and/or transfers;A lot of help with bathing/dressing/bathroom;Assistance with feeding ?  ?Equipment Recommendations ? BSC/3in1;Wheelchair (measurements OT);Wheelchair  cushion (measurements OT)  ?  ?Recommendations for Other Services   ? ?  ?Precautions / Restrictions Precautions ?Precautions: Fall ?Precaution Comments: monitor HR (tachy) ?Restrictions ?Weight Bearing Restrictions: No  ? ? ?  ? ?Mobility Bed Mobility ?Overal bed mobility: Needs Assistance ?Bed Mobility: Supine to Sit ?  ?  ?Supine to sit: Max assist ?  ?  ?General bed mobility comments: pt initially yelling out with initial movement of LE to EOB though likely due to surprise rather than pain. Consistent cues, placing hands on bedrails needed with assist needed to scoot hips and lift trunk. pt able to demo some inititation of LEs to EOB ?  ? ?Transfers ?Overall transfer level: Needs assistance ?Equipment used: 1 person hand held assist ?Transfers: Sit to/from Stand, Bed to chair/wheelchair/BSC ?Sit to Stand: Mod assist ?Stand pivot transfers: Max assist, +2 safety/equipment ?  ?  ?  ?  ?General transfer comment: Mod A to stand from bedside with kyphotic posture and head against therapist's chest. With max cues, pt able to demo slow following of directions to take 3 steps towards recliner but required assist to swing hips fully in chair due to fatigue. Pt's son present, assisting with tactile/verbal cues to pull shoulders back as possible ?  ?  ?Balance Overall balance assessment: Needs assistance ?Sitting-balance support: No upper extremity supported, Feet supported ?Sitting balance-Leahy Scale: Poor ?Sitting balance - Comments: initially with posterior lean but with placement of hands on bed/bedrail, able to maintain static sitting without support 1-2 min before anterior lean noted. Pt reported fearful of falling backwards, acknowledged that she was leaning forward but difficulty correcting with assist ?Postural control: Posterior lean, Other (comment) (anterior) ?Standing balance support: Bilateral upper extremity supported, During functional activity, Reliant on assistive device for balance ?Standing  balance-Leahy Scale: Poor ?  ?  ?  ?  ?  ?  ?  ?  ?  ?  ?  ?  ?   ? ?ADL either performed or assessed with clinical judgement  ? ?ADL Overall ADL's : Needs assistance/impaired ?  ?  ?  ?  ?  ?  ?  ?  ?  ?  ?  ?  ?  ?  ?  ?  ?  ?  ?  ?General ADL Comments: Focus on sequencing, slow pacing, OOB transfers and R UE function assessment ?  ? ?Extremity/Trunk Assessment Upper Extremity Assessment ?Upper Extremity Assessment: RUE deficits/detail ?RUE Deficits / Details: difficulty with full grasp due to recent IV infiltration per pt's son. Provided squeeze ball with pt able to demo light squeeze but facial expressions resemble discomfort ?RUE Coordination: decreased fine motor ?  ?Lower Extremity Assessment ?Lower Extremity Assessment: Defer to PT evaluation ?  ?  ?  ? ?Vision   ?Vision Assessment?: No apparent visual deficits ?  ?Perception   ?  ?Praxis   ?  ? ?Cognition Arousal/Alertness: Awake/alert ?Behavior During Therapy: Flat affect, Anxious ?Overall Cognitive Status: History of cognitive impairments - at baseline ?  ?  ?  ?  ?  ?  ?  ?  ?  ?  ?  ?  ?  ?  ?  ?  ?General Comments: Son reporting that pt's orientation and cognition varies throughout the day and week. He has to provide orientation each morning and cues for functional tasks. pt benefits from multimodal cues and slow pacing of instructions; able to follow cues majority of time but limited due to anxiety ?  ?  ?   ?Exercises Exercises: Other exercises ?Other Exercises ?Other Exercises: squeeze ball B hands ? ?  ?Shoulder Instructions   ? ? ?  ?General Comments Son, Pieter Partridge, present - supportive, hands on and engaged throughout  ? ? ?Pertinent Vitals/ Pain       Pain Assessment ?Pain Assessment: Faces ?Faces Pain Scale: Hurts even more ?Pain Location: back though inconsistent appearance; at times, pt appears to yell out in surprise rather than pain ?Pain Descriptors / Indicators: Discomfort ?Pain Intervention(s): Monitored during session, Limited activity  within patient's tolerance ? ?Home Living   ?  ?  ?  ?  ?  ?  ?  ?  ?  ?  ?  ?  ?  ?  ?  ?  ?  ?  ? ?  ?Prior Functioning/Environment    ?  ?  ?  ?   ? ?Frequency ? Min 2X/week  ? ? ? ? ?  ?Progress Toward Goals ? ?OT Goals(current goals can now be found in the care plan section) ? Progress towards OT goals: Progressing toward goals ? ?Acute Rehab OT Goals ?Patient Stated Goal: son would like for pt to increase strength ?OT Goal Formulation: With patient ?Time For Goal Achievement: 08/10/21 ?Potential to Achieve Goals: Good ?ADL Goals ?Pt Will Perform Grooming: with supervision;standing ?Pt Will Perform Lower Body Dressing: with supervision;sit to/from stand ?Pt Will Transfer to Toilet: with supervision;ambulating;regular height toilet ?Pt Will Perform Toileting - Clothing Manipulation and hygiene: with supervision;sit to/from stand  ?Plan Discharge plan remains appropriate   ? ?Co-evaluation ? ? ?   ?  ?  ?  ?  ? ?  ?AM-PAC OT "6 Clicks" Daily Activity     ?Outcome Measure ? ? Help from another person eating meals?:  A Little ?Help from another person taking care of personal grooming?: A Little ?Help from another person toileting, which includes using toliet, bedpan, or urinal?: Total ?Help from another person bathing (including washing, rinsing, drying)?: A Lot ?Help from another person to put on and taking off regular upper body clothing?: A Lot ?Help from another person to put on and taking off regular lower body clothing?: Total ?6 Click Score: 12 ? ?  ?End of Session Equipment Utilized During Treatment: Gait belt ? ?OT Visit Diagnosis: Unsteadiness on feet (R26.81);Other abnormalities of gait and mobility (R26.89);Muscle weakness (generalized) (M62.81) ?  ?Activity Tolerance Patient tolerated treatment well ?  ?Patient Left in chair;with call bell/phone within reach;with chair alarm set;with family/visitor present;Other (comment) (MD at bedside) ?  ?Nurse Communication Mobility status ?  ? ?   ? ?Time:  1010-1044 ?OT Time Calculation (min): 34 min ? ?Charges: OT General Charges ?$OT Visit: 1 Visit ?OT Treatments ?$Self Care/Home Management : 8-22 mins ?$Therapeutic Activity: 8-22 mins ? ?Malachy Chamber, OTR/L ?Acute Rehab Services ?Of

## 2021-07-31 NOTE — Assessment & Plan Note (Addendum)
-  Body mass index is 16.83 kg/m?[name].  ?-The patient has at least 2 indicators for malnutrition (insufficient energy intake, weight loss, loss of muscle mass, diminished functional status/handgrip strength). ?-Will obtain a nutrition consult for further recommendations. ?

## 2021-07-31 NOTE — Progress Notes (Signed)
Pt's heart rate was at 125-127 and sustaining since around 1900. Her BP was 134/72. The on call cardiologist, Dr. Julianne Handler, was informed and he increased the patient's oral Cardizem to 90 mg. Also, the patient was complaining of pain in her lower back and pain when moving extremities.  According to the patient's son, she has had a known hair-line fracture in her spine since January.  Tylenol 650 mg was given at 2023. The on call vascular surgeon, Dr. Myra Gianotti, was informed about the patient and ordered a one time oral Ultram 50 mg.  Will continue to monitor. ? ?Harriet Masson, RN ? ?

## 2021-07-31 NOTE — Progress Notes (Signed)
OT Cancellation Note ? ?Patient Details ?Name: TNIYA BOWDITCH ?MRN: [id number] ?DOB: May 05, 1932 ? ? ?Cancelled Treatment:    Reason Eval/Treat Not Completed: Patient at procedure or test/ unavailable in Endo for TEE and possible cardioversion. Will check back for OT session as schedule permits. ? ?[name] ?07/31/2021, 7:56 AM ?

## 2021-07-31 NOTE — Assessment & Plan Note (Addendum)
-  She is continuing to have hyperglycemia despite Semglee 10 - will increase to 12 units ?-Continue to cover with sensitive-scale SSI ?-Stop Glucotrol due to elevated risk of hypoglycemia ?-Continue metformin ?-DM coordinator is following, as well ?

## 2021-07-31 NOTE — TOC Progression Note (Signed)
Transition of Care (TOC) - Progression Note  ? ? ?Patient Details  ?Name: TELLY BROBERG ?MRN: [id number] ?Date of Birth: Dec 28, 1932 ? ?Transition of Care (TOC) CM/SW Contact  ?Eduard Roux, LCSW ?Phone Number: ?07/31/2021, 4:39 PM ? ?Clinical Narrative:    ? ?SNF will need to start auth- sent message updated PT/OT notes are completed-waiting on response  ? ?Antony Blackbird, MSW, LCSW ?Clinical Social Worker ? ? ? ?Expected Discharge Plan: Skilled Nursing Facility ?Barriers to Discharge: Continued Medical Work up, English as a second language teacher, SNF Pending bed offer ? ?Expected Discharge Plan and Services ?Expected Discharge Plan: Skilled Nursing Facility ?  ?  ?Post Acute Care Choice: Skilled Nursing Facility ?Living arrangements for the past 2 months: Single Family Home ?                ?  ?  ?  ?  ?  ?  ?  ?  ?  ?  ? ? ?Social Determinants of Health (SDOH) Interventions ?  ? ?Readmission Risk Interventions ?   ? View : No data to display.  ?  ?  ?  ? ? ?

## 2021-07-31 NOTE — Progress Notes (Signed)
Inpatient Diabetes Program Recommendations ? ?AACE/ADA: New Consensus Statement on Inpatient Glycemic Control (2015) ? ?Target Ranges:  Prepandial:   less than 140 mg/dL ?     Peak postprandial:   less than 180 mg/dL (1-2 hours) ?     Critically ill patients:  140 - 180 mg/dL  ? ?Lab Results  ?Component Value Date  ? GLUCAP 354 (H) 07/31/2021  ? HGBA1C 7.3 (H) 07/24/2021  ? ? ?Review of Glycemic Control ? Latest Reference Range & Units 07/29/21 21:41 07/30/21 06:49 07/30/21 11:50 07/30/21 16:02 07/30/21 20:34 07/31/21 05:58  ?Glucose-Capillary 70 - 99 mg/dL 335 (H) 456 (H) 256 (H) 145 (H) 375 (H) 354 (H)  ?(H): Data is abnormally high ?Diabetes history: Type 2 DM ?Outpatient Diabetes medications: Metformin 1000 mg BID, Glipizide 10 mg BID, Lantus 10 units QHS ?Current orders for Inpatient glycemic control: Metformin 1000 mg BID, Glipizide 10 mg BID, Semglee 10 units QHS, Novolog 0-9 units TID ? ?Inpatient Diabetes Program Recommendations:   ? ?Consider increasing Semglee to 12 units QHS and adding Novolog 3 units TID (assuming patient is consuming >50% of meals).  ? ?Thanks, ?Lujean Rave, MSN, RNC-OB ?Diabetes Coordinator ?[phone] (8a-5p) ? ? ? ?

## 2021-07-31 NOTE — Progress Notes (Signed)
?Progress Note ? ? ? ?07/31/2021 ?9:29 AM ?Day of Surgery ? ?Subjective:   ?Right arm pain. Soft spoken. ? ?Vitals:  ? 07/31/21 0751 07/31/21 0855  ?BP: (!) 106/44 114/71  ?Pulse: 87 85  ?Resp: (!) 25 17  ?Temp:  97.6 ?F (36.4 ?C)  ?SpO2: 95% 97%  ? ?Physical Exam: ?Cardiac:  irregular ?Lungs:  non labored ?Incisions:  left groin incision c/d/I without swelling or hematoma ?Extremities:  well perfused and warm. Brisk left PT signal, palpable right DP, doppler PT ?Abdomen:  flat, soft, non distended ?Neurologic: alert ? ?CBC ?   ?Component Value Date/Time  ? WBC 9.9 07/31/2021 0246  ? RBC 3.26 (L) 07/31/2021 0246  ? HGB 11.5 (L) 07/31/2021 0246  ? HGB 13.9 12/04/2020 0824  ? HCT 34.0 (L) 07/31/2021 0246  ? HCT 42.4 12/04/2020 0824  ? PLT 174 07/31/2021 0246  ? PLT 224 12/04/2020 0824  ? MCV 104.3 (H) 07/31/2021 0246  ? MCV 101 (H) 12/04/2020 0824  ? MCH 35.3 (H) 07/31/2021 0246  ? MCHC 33.8 07/31/2021 0246  ? RDW 14.0 07/31/2021 0246  ? RDW 11.9 12/04/2020 0824  ? LYMPHSABS 0.9 07/31/2021 0246  ? LYMPHSABS 1.7 04/22/2019 1420  ? MONOABS 1.5 (H) 07/31/2021 0246  ? EOSABS 0.0 07/31/2021 0246  ? EOSABS 0.1 04/22/2019 1420  ? BASOSABS 0.0 07/31/2021 0246  ? BASOSABS 0.0 04/22/2019 1420  ? ? ?BMET ?   ?Component Value Date/Time  ? NA 138 07/30/2021 0416  ? NA 142 12/04/2020 0824  ? K 3.9 07/30/2021 0416  ? CL 101 07/30/2021 0416  ? CO2 28 07/30/2021 0416  ? GLUCOSE 158 (H) 07/30/2021 0416  ? BUN 22 07/30/2021 0416  ? BUN 21 12/04/2020 0824  ? CREATININE 0.75 07/30/2021 0416  ? CREATININE 0.79 03/21/2016 1116  ? CALCIUM 9.3 07/30/2021 0416  ? GFRNONAA >60 07/30/2021 0416  ? GFRAA >60 06/26/2019 0457  ? ? ?INR ?   ?Component Value Date/Time  ? INR 1.3 (H) 07/31/2021 [id number]  ? INR 4.3 06/06/2010 1010  ? ? ? ?Intake/Output Summary (Last 24 hours) at 07/31/2021 0929 ?Last data filed at 07/31/2021 [id number] ?Gross per 24 hour  ?Intake 708.63 ml  ?Output --  ?Net 708.63 ml  ? ? ? ? ?Assessment/Plan:  88 y.o. female is s/p Left CFA  endarterectomy Day of Surgery  ? ?- left groin looks good c/d/I without swelling or hematoma ?- pt continues to have brisk left PT doppler signal. Palpable right Dp, doppler PT ? ?Patient's cardioversion canceled today due to improvement in heart rate, remains in A-fib. ?I saw the patient immediately after due to some concern with altered mental status, somnolence. ?On exam she was somnolent, but able to answer questions.  On exam, still having pain in the right arm.  This is out of proportion.  I raise my voice in an effort for her to hear me which caused her to wake up pretty dramatically.  We had a very in-depth conversation with her son at bedside regarding her current clinical course.   ? ?Per surgery-common femoral endarterectomy last Thursday, usually has a 1 to 2-day hospital stay.  Understandably, hers was prolonged due to the A-fib, however her overall clinical status has declined over the last several days. ?Labs demonstrate no major concerns. ? ?She needs aggressive physical therapy and Occupational Therapy.  I would appreciate it if Occupational Therapy provided exercises for the right arm as well. I educated zhania that she needs to be up out  of the bed for the majority of the day, sitting in a chair.  She needs to be walking around the unit with nursing, and must use the bathroom in the restroom, not in the bed.  She needs to feed herself dinner. ? ?I told Sarah-Jane she is declining, and if she wants to live, she must begin to push and take ownership in her recovery.  Both her and her son were very receptive to this.  Her son pulled me aside in the hallway after and thanked me for having this conversation with her.  ? ?The above was discussed with nursing.  With her heart rate controlled, Kasarah's current medical problems include uncontrolled blood sugars, pain in the right arm. ? ?I have consulted hospital medicine in effort to improve her BG's as well as provide recommendations regarding to optimize  her overall care. ? ?  ?DVT prophylaxis:  Eliquis ? ?Broadus John ?[facility] ?[phone] ?07/31/2021 ?9:29 AM ? ? ? ?

## 2021-07-31 NOTE — Progress Notes (Signed)
Nutrition Consult (brief note) ? ?Received MD consult for assessment of nutrition status. Initial nutrition assessment was completed yesterday afternoon. Please see progress note dated 4/17 for details. Patient meets criteria for severe malnutrition in the context of chronic illness. RD ordered Ensure supplements TID and MVI daily. RD to continue to follow and assist with maximizing oral intake throughout hospitalization.  ? ?Gabriel Rainwater RD, LDN, CNSC ?Please refer to Amion for contact information.                                                       ? ?

## 2021-08-01 DIAGNOSIS — I4891 Unspecified atrial fibrillation: Secondary | ICD-10-CM

## 2021-08-01 DIAGNOSIS — I10 Essential (primary) hypertension: Secondary | ICD-10-CM

## 2021-08-01 LAB — CBC
HCT: 30.9 % — ABNORMAL LOW (ref 36.0–46.0)
Hemoglobin: 10.7 g/dL — ABNORMAL LOW (ref 12.0–15.0)
MCH: 35.9 pg — ABNORMAL HIGH (ref 26.0–34.0)
MCHC: 34.6 g/dL (ref 30.0–36.0)
MCV: 103.7 fL — ABNORMAL HIGH (ref 80.0–100.0)
Platelets: 175 10*3/uL (ref 150–400)
RBC: 2.98 MIL/uL — ABNORMAL LOW (ref 3.87–5.11)
RDW: 13.8 % (ref 11.5–15.5)
WBC: 7.2 10*3/uL (ref 4.0–10.5)
nRBC: 0 % (ref 0.0–0.2)

## 2021-08-01 LAB — GLUCOSE, CAPILLARY
Glucose-Capillary: 217 mg/dL — ABNORMAL HIGH (ref 70–99)
Glucose-Capillary: 130 mg/dL — ABNORMAL HIGH (ref 70–99)
Glucose-Capillary: 166 mg/dL — ABNORMAL HIGH (ref 70–99)
Glucose-Capillary: 156 mg/dL — ABNORMAL HIGH (ref 70–99)

## 2021-08-01 LAB — BASIC METABOLIC PANEL
Anion gap: 7 (ref 5–15)
BUN: 30 mg/dL — ABNORMAL HIGH (ref 8–23)
CO2: 28 mmol/L (ref 22–32)
Calcium: 8.9 mg/dL (ref 8.9–10.3)
Chloride: 99 mmol/L (ref 98–111)
Creatinine, Ser: 1.02 mg/dL — ABNORMAL HIGH (ref 0.44–1.00)
GFR, Estimated: 54 mL/min — ABNORMAL LOW (ref >60)
Glucose, Bld: 158 mg/dL — ABNORMAL HIGH (ref 70–99)
Potassium: 3.9 mmol/L (ref 3.5–5.1)
Sodium: 134 mmol/L — ABNORMAL LOW (ref 135–145)

## 2021-08-01 MED ORDER — HALOPERIDOL LACTATE 5 MG/ML IJ SOLN: 1.0000 mg | Freq: Four times a day (QID) | INTRAMUSCULAR | Status: DC | PRN

## 2021-08-01 MED ORDER — DILTIAZEM HCL ER COATED BEADS 120 MG PO CP24
240.0000 mg | ORAL_CAPSULE | Freq: Every day | ORAL | Status: DC
  Administered 2021-08-01: 240 mg via ORAL
  Filled 2021-08-01: qty 2

## 2021-08-01 MED ORDER — DILTIAZEM HCL 60 MG PO TABS
60.0000 mg | ORAL_TABLET | Freq: Once | ORAL | Status: AC
  Administered 2021-08-01: 60 mg via ORAL
  Filled 2021-08-01: qty 1

## 2021-08-01 MED ORDER — METOPROLOL TARTRATE 5 MG/5ML IV SOLN
5.0000 mg | Freq: Once | INTRAVENOUS | Status: AC
  Administered 2021-08-01: 5 mg via INTRAVENOUS
  Filled 2021-08-01: qty 5

## 2021-08-01 MED ORDER — TRAMADOL HCL 50 MG PO TABS
50.0000 mg | ORAL_TABLET | Freq: Four times a day (QID) | ORAL | Status: DC | PRN
  Administered 2021-08-01 – 2021-08-03 (×3): 50 mg via ORAL
  Filled 2021-08-01 (×4): qty 1

## 2021-08-01 MED ORDER — DILTIAZEM HCL 60 MG PO TABS
60.0000 mg | ORAL_TABLET | Freq: Once | ORAL | Status: AC
  Administered 2021-08-02: 60 mg via ORAL
  Filled 2021-08-01: qty 1

## 2021-08-01 NOTE — Progress Notes (Signed)
BP is 145/76 and heart rate is 119.  Will continue to monitor. ? ?Harriet Masson, RN ? ?

## 2021-08-01 NOTE — Progress Notes (Signed)
Cardiology follow up arranged 08/15/21 and A fib clinic follow up arranged 08/07/21 per Dr Radford Pax request, see AVS.  ?

## 2021-08-01 NOTE — Progress Notes (Signed)
Pt had a second episode of emesis. Unable to measure. Gave PRN IV Zofran 4 mg.  ? ?Harriet Masson, RN ? ?

## 2021-08-01 NOTE — Progress Notes (Addendum)
?  Progress Note ? ? ? ?08/01/2021 ?8:59 AM ?1 Day Post-Op ? ?Subjective:  much more alert this morning. No complaints ? ? ?Vitals:  ? 08/01/21 0400 08/01/21 0722  ?BP: (!) 145/76 (!) 95/55  ?Pulse: (!) 115 73  ?Resp: (!) 21 20  ?Temp: 97.6 ?F (36.4 ?C) 97.6 ?F (36.4 ?C)  ?SpO2: 98% 97%  ? ?Physical Exam: ?Cardiac: irregular ?Lungs:  non labored ?Incisions:  left groin incision is c/d/I without swelling or hematoma ?Extremities:  BLE well perfused and warm. Brisk left PT. Palpable Right DP ?Abdomen:  flat, soft, non distended ?Neurologic: alert  ? ?CBC ?   ?Component Value Date/Time  ? WBC 7.2 08/01/2021 0754  ? RBC 2.98 (L) 08/01/2021 0754  ? HGB 10.7 (L) 08/01/2021 0754  ? HGB 13.9 12/04/2020 0824  ? HCT 30.9 (L) 08/01/2021 0754  ? HCT 42.4 12/04/2020 0824  ? PLT 175 08/01/2021 0754  ? PLT 224 12/04/2020 0824  ? MCV 103.7 (H) 08/01/2021 0754  ? MCV 101 (H) 12/04/2020 0824  ? MCH 35.9 (H) 08/01/2021 0754  ? MCHC 34.6 08/01/2021 0754  ? RDW 13.8 08/01/2021 0754  ? RDW 11.9 12/04/2020 0824  ? LYMPHSABS 0.9 07/31/2021 0246  ? LYMPHSABS 1.7 04/22/2019 1420  ? MONOABS 1.5 (H) 07/31/2021 0246  ? EOSABS 0.0 07/31/2021 0246  ? EOSABS 0.1 04/22/2019 1420  ? BASOSABS 0.0 07/31/2021 0246  ? BASOSABS 0.0 04/22/2019 1420  ? ? ?BMET ?   ?Component Value Date/Time  ? NA 134 (L) 08/01/2021 0754  ? NA 142 12/04/2020 0824  ? K 3.9 08/01/2021 0754  ? CL 99 08/01/2021 0754  ? CO2 28 08/01/2021 0754  ? GLUCOSE 158 (H) 08/01/2021 0754  ? BUN 30 (H) 08/01/2021 0754  ? BUN 21 12/04/2020 0824  ? CREATININE 1.02 (H) 08/01/2021 0754  ? CREATININE 0.79 03/21/2016 1116  ? CALCIUM 8.9 08/01/2021 0754  ? GFRNONAA 54 (L) 08/01/2021 0754  ? GFRAA >60 06/26/2019 0457  ? ? ?INR ?   ?Component Value Date/Time  ? INR 1.3 (H) 07/31/2021 [id number]  ? INR 4.3 06/06/2010 1010  ? ? ? ?Intake/Output Summary (Last 24 hours) at 08/01/2021 0859 ?Last data filed at 08/01/2021 0121 ?Gross per 24 hour  ?Intake 1208.53 ml  ?Output 300 ml  ?Net 908.53 ml   ? ? ? ?Assessment/Plan:  88 y.o. female is s/p L CFA Endarterectomy 6 Days Post- Op  ? ?Much more alert this morning ?Left going incision intact and well appearing. LLE well perfused and warm ?CXR without any acute findings ?Hemodynamically stable ?Appreciate TRH assistance with medical management ?Appreciate Diabetes coordinator assistance with her glycemic control ?Remains in afib with RVR ?BP soft ?Cardioversion 4/18 was cancelled due to AMS. This will be arranged as outpatient in 1 month ?Appreciate cardiology Assistance. Cardiology has now signed off. Outpatient follow up arranged ?Continue PO Amiodarone 200 mg BID, Apixaban 2.5 mg BID, Atorvastatin 40 mg, Cardiazem 240 mg daily, Zetia 10 mg daily, lopressor 50 mg BID. Discontinued Lisinopril ?PT/ OT recommending SNF ?TOC assisting with authorization ? ?DVT prophylaxis:  Eliquis ? ? ?Karoline Caldwell, PA-C ?[facility] ?[phone] ?08/01/2021 ?8:59 AM ? ?VASCULAR STAFF ADDENDUM: ?I have independently interviewed and examined the patient. ?I agree with the above.  ?Will reengage cardioology is issues ?Overall better day. Aggressive PT/OT ?Back pain from previous hairline fracture.  ?Pt will need Snf ? ?J. Melene Muller, MD ?[facility] ?Office Phone Number: [phone] ? ? ? ?

## 2021-08-01 NOTE — Progress Notes (Addendum)
Inpatient Diabetes Program Recommendations ? ?AACE/ADA: New Consensus Statement on Inpatient Glycemic Control (2015) ? ?Target Ranges:  Prepandial:   less than 140 mg/dL ?     Peak postprandial:   less than 180 mg/dL (1-2 hours) ?     Critically ill patients:  140 - 180 mg/dL  ? ?Lab Results  ?Component Value Date  ? GLUCAP 217 (H) 08/01/2021  ? HGBA1C 7.3 (H) 07/24/2021  ? ? ?Review of Glycemic Control ? Latest Reference Range & Units 07/31/21 05:58 07/31/21 11:47 07/31/21 15:57 07/31/21 21:19 08/01/21 06:03  ?Glucose-Capillary 70 - 99 mg/dL 122 (H) 449 (H) 753 (H) 260 (H) 217 (H)  ? ?Diabetes history: Type 2 DM ?Outpatient Diabetes medications: Metformin 1000 mg BID, Glipizide 10 mg BID, Lantus 10 units QHS ?Current orders for Inpatient glycemic control: Metformin 1000 mg BID, Glipizide 10 mg BID, Semglee 12 units QHS, Novolog 0-9 units TID ? ?Inpatient Diabetes Program Recommendations:   ? ?Consider increasing Semglee to 14 units QHS  ? ?Thanks, ?Christena Deem RN, MSN, BC-ADM ?Inpatient Diabetes Coordinator ?Team Pager [phone] (8a-5p) ? ? ?

## 2021-08-01 NOTE — Progress Notes (Signed)
Noted pts HR increased and sustaining around 124-126 bpm. Pt also c/o increased pain around time of increased HR. Informed Dr. Joya Martyr and new orders placed. ? ?Brooke Pace, RN ? ?

## 2021-08-01 NOTE — Plan of Care (Signed)

## 2021-08-01 NOTE — Progress Notes (Signed)
Physical Therapy Treatment ?Patient Details ?Name: [name] ?MRN: [id number] ?DOB: 23-Aug-1932 ?Today's Date: 08/01/2021 ? ? ?History of Present Illness 88 y.o. female who presented 07/26/21 for L CFA endarterectomy secondary to [place] 4 critical limb ischemia in the left leg. Code Stroke called after surgery when pt was disoriented and with L facial droop. MRI of head was negative with neuro signing off and indicating she would benefit from an outpatient dementia evaluation. Now she is in Afib RV/Aflutter. PMH: afib, DM, DVT, HTN, HLD, PAD, CVA. ? ?  ?PT Comments  ? ? Pt received in supine, alert and agreeable to therapy session, oriented to self/situation and family and with improved command following compared with previous PT session. Pt able to perform bed mobility with up to minA and initiate short gait trials in room with RW and up to minA +2 for safety. Pt given close chair follow and needed x2 seated breaks to achieve ~46ft with RW support. Pt agreeable to sit up in recliner at end of session and family present for safety, they report they will alert nursing staff when pt is ready to return to bed. Mobility specialist also notified of pt mobility progress in case nursing staff needs further assist to mobilize pt safely. Pt continues to benefit from PT services to progress toward functional mobility goals.    ?Recommendations for follow up therapy are one component of a multi-disciplinary discharge planning process, led by the attending physician.  Recommendations may be updated based on patient status, additional functional criteria and insurance authorization. ? ?Follow Up Recommendations ? Skilled nursing-short term rehab (<3 hours/day) ?  ?  ?Assistance Recommended at Discharge Frequent or constant Supervision/Assistance  ?Patient can return home with the following A little help with walking and/or transfers;A little help with bathing/dressing/bathroom;Assistance with cooking/housework;Direct  supervision/assist for medications management;Direct supervision/assist for financial management;Assist for transportation;Help with stairs or ramp for entrance ?  ?Equipment Recommendations ? None recommended by PT  ?  ?Recommendations for Other Services   ? ? ?  ?Precautions / Restrictions Precautions ?Precautions: Fall ?Precaution Comments: monitor HR (tachy) ?Restrictions ?Weight Bearing Restrictions: No  ?  ? ?Mobility ? Bed Mobility ?Overal bed mobility: Needs Assistance ?Bed Mobility: Supine to Sit ?  ?  ?Supine to sit: Min assist, HOB elevated, +2 for safety/equipment ?  ?  ?General bed mobility comments: Pt placing hands on bedrails to raise trunk and needed minA to scoot hips to foot flat. ?  ? ?Transfers ?Overall transfer level: Needs assistance ?Equipment used: Rolling walker (2 wheels) ?Transfers: Sit to/from Stand ?Sit to Stand: Min assist, Mod assist, +2 safety/equipment ?  ?  ?  ?  ?  ?General transfer comment: improved initiation, pt not able to follow instructions to push from bed so second person assisting to stabilize RW for safety and managing lines. from EOB>RW and chair<>RW x3 total reps ?  ? ?Ambulation/Gait ?Ambulation/Gait assistance: Min assist, +2 safety/equipment ?Gait Distance (Feet): 20 Feet (33ft, seated break, 27ft, seated break, 6ft) ?Assistive device: Rolling walker (2 wheels) ?Gait Pattern/deviations: Step-to pattern, Decreased step length - right, Decreased step length - left, Decreased stride length, Shuffle, Trunk flexed, Narrow base of support, Decreased dorsiflexion - right, Decreased dorsiflexion - left ?  ?  ?  ?  ? ? ? ? ?  ?Balance Overall balance assessment: Needs assistance ?Sitting-balance support: Feet supported, Single extremity supported ?Sitting balance-Leahy Scale: Poor ?Sitting balance - Comments: min guard for safety, pt self-supporting with 1-2 UE ?Postural control: Other (  comment) (anterior) ?Standing balance support: Bilateral upper extremity supported, During  functional activity, Reliant on assistive device for balance ?Standing balance-Leahy Scale: Poor ?Standing balance comment: reliant on RW but only needing +1 physical assist to manage RW/external assist (chair follow for safety) ?  ?  ?  ?  ?  ?  ?  ? ?  ?Cognition Arousal/Alertness: Awake/alert ?Behavior During Therapy: Anxious ?Overall Cognitive Status: History of cognitive impairments - at baseline ?  ?  ?  ?  ?  ?  ?  ?  ?General Comments: Son reporting that pt's orientation and cognition varies throughout the day and week. He has to provide orientation each morning and cues for functional tasks. pt benefits from multimodal cues and slow pacing of instructions; Pt much more alert and able to verbalize needs much better compared with previous PT session. pt following 1 and some 2-step commands today. ?  ?  ? ?  ?Exercises   ? ?  ?General Comments  HR elevated at rest 127 bpm and up to 129 bpm with exertion; SpO2 WFL on RA ?  ?  ? ?Pertinent Vitals/Pain Pain Assessment ?Pain Assessment: Faces ?Faces Pain Scale: Hurts even more ?Pain Location: back during gait trial; also reacts due to surprise so at times inconsistent; pain appearing to increase as gait progressed ?Pain Descriptors / Indicators: Discomfort, Grimacing, Moaning ?Pain Intervention(s): Monitored during session, Premedicated before session, Repositioned  ? ? ? ?PT Goals (current goals can now be found in the care plan section) Acute Rehab PT Goals ?Patient Stated Goal: decreased pain ?PT Goal Formulation: With patient/family ?Time For Goal Achievement: 08/10/21 ?Progress towards PT goals: Progressing toward goals ? ?  ?Frequency ? ? ? Min 3X/week ? ? ? ?  ?PT Plan Current plan remains appropriate  ? ? ?   ?AM-PAC PT "6 Clicks" Mobility   ?Outcome Measure ? Help needed turning from your back to your side while in a flat bed without using bedrails?: A Lot ?Help needed moving from lying on your back to sitting on the side of a flat bed without using  bedrails?: A Little ?Help needed moving to and from a bed to a chair (including a wheelchair)?: A Lot (mod cues) ?Help needed standing up from a chair using your arms (e.g., wheelchair or bedside chair)?: A Lot ?Help needed to walk in hospital room?: A Lot ?Help needed climbing 3-5 steps with a railing? : Total ?6 Click Score: 12 ? ?  ?End of Session Equipment Utilized During Treatment: Gait belt ?Activity Tolerance: Patient tolerated treatment well ?Patient left: in chair;with call bell/phone within reach;with family/visitor present;Other (comment) (per family they will be in room until 9pm, RN notified chair alarm not on due to pt anxiety and family will be present constantly) ?Nurse Communication: Mobility status;Other (comment) (+2 for safety to return to bed) ?PT Visit Diagnosis: Unsteadiness on feet (R26.81);Other abnormalities of gait and mobility (R26.89);Muscle weakness (generalized) (M62.81);Difficulty in walking, not elsewhere classified (R26.2) ?  ? ? ?Time: [id number] ?PT Time Calculation (min) (ACUTE ONLY): 38 min ? ?Charges:  $Gait Training: 23-37 mins ?$Therapeutic Activity: 8-22 mins          ?          ? ?Yves Fodor P., PTA ?Acute Rehabilitation Services ?Secure Chat Preferred 9a-5:30pm ?Office: [phone]  ? ? ?Milfred Krammes M Yamel Bale ?08/01/2021, 6:19 PM ? ?

## 2021-08-01 NOTE — Progress Notes (Addendum)
Pt had one episode of emesis. Unable to measure. Pt declines PRN antiemetic at this time. Pt vomited one time dose of 60mg  diltiazem.  ? ? , RN ? ?

## 2021-08-01 NOTE — Progress Notes (Signed)
? ?Progress Note ? ?Patient Name: [name] ?Date of Encounter: 08/01/2021 ? ?[facility] Cardiologist: Kirk Ruths, MD  ? ?Subjective  ?Heart rate much improved but still around 80-100.  Very sleepy this am.  ? ?Inpatient Medications  ?  ?Scheduled Meds: ? amiodarone  200 mg Oral BID  ? apixaban  2.5 mg Oral BID  ? atorvastatin  40 mg Oral Daily  ? diltiazem  90 mg Oral Q6H  ? docusate sodium  100 mg Oral Daily  ? ezetimibe  10 mg Oral Daily  ? feeding supplement  237 mL Oral TID BM  ? insulin aspart  0-9 Units Subcutaneous TID WC  ? insulin glargine-yfgn  12 Units Subcutaneous QHS  ? lisinopril  20 mg Oral Daily  ? metFORMIN  1,000 mg Oral BID WC  ? metoprolol tartrate  50 mg Oral BID  ? multivitamin with minerals  1 tablet Oral Daily  ? pantoprazole  40 mg Oral Daily  ? ?Continuous Infusions: ? magnesium sulfate bolus IVPB    ? ?PRN Meds: ?acetaminophen **OR** acetaminophen, alum & mag hydroxide-simeth, guaiFENesin-dextromethorphan, haloperidol lactate, hydrALAZINE, magnesium sulfate bolus IVPB, ondansetron, phenol, potassium chloride  ? ?Vital Signs  ?  ?Vitals:  ? 08/01/21 0117 08/01/21 0119 08/01/21 0400 08/01/21 0722  ?BP: (!) 91/54 (!) 89/53 (!) 145/76 (!) 95/55  ?Pulse: 75 69 (!) 115 73  ?Resp: 17 14 (!) 21 20  ?Temp:   97.6 ?F (36.4 ?C) 97.6 ?F (36.4 ?C)  ?TempSrc:   Oral Oral  ?SpO2:   98% 97%  ?Weight:      ?Height:      ? ? ?Intake/Output Summary (Last 24 hours) at 08/01/2021 0801 ?Last data filed at 08/01/2021 0121 ?Gross per 24 hour  ?Intake 1208.53 ml  ?Output 300 ml  ?Net 908.53 ml  ? ? ? ?  07/31/2021  ?  7:51 AM 07/29/2021  ?  4:00 AM 07/26/2021  ?  6:09 AM  ?Last 3 Weights  ?Weight (lbs) 83 lb 5.3 oz 83 lb 5.3 oz 84 lb 1.6 oz  ?Weight (kg) 37.8 kg 37.8 kg 38.148 kg  ?   ? ?Telemetry  ?  ?Atrial flutter with variable AV block at 73 bpm- Personally Reviewed ? ?ECG  ?  ?No new EKG to review personally Reviewed ? ?Physical Exam  ? ?GEN: Thin cachectic appearing ?HEENT: Normal ?NECK: No JVD;  No carotid bruits ?LYMPHATICS: No lymphadenopathy ?CARDIAC: Irregularly irregular, no murmurs, rubs, gallops ?RESPIRATORY:  Clear to auscultation without rales, wheezing or rhonchi  ?ABDOMEN: Soft, non-tender, non-distended ?MUSCULOSKELETAL:  No edema; No deformity  ?SKIN: Warm and dry ?NEUROLOGIC: cannot assess too sleepy ?PSYCHIATRIC:cannot assess too sleepy ?Labs  ?  ?High Sensitivity Troponin:  No results for input(s): TROPONINIHS in the last 720 hours.   ?Chemistry ?Recent Labs  ?Lab 07/26/21 ?1131 07/27/21 ?0133 07/30/21 ?0416  ?NA  --  135 138  ?K  --  3.8 3.9  ?CL  --  100 101  ?CO2  --  27 28  ?GLUCOSE  --  263* 158*  ?BUN  --  16 22  ?CREATININE 0.72 0.84 0.75  ?CALCIUM  --  8.8* 9.3  ?MG  --   --  1.6*  ?GFRNONAA >60 >60 >60  ?ANIONGAP  --  8 9  ? ?  ?Lipids No results for input(s): CHOL, TRIG, HDL, LABVLDL, LDLCALC, CHOLHDL in the last 168 hours.  ?Hematology ?Recent Labs  ?Lab 07/27/21 ?0133 07/30/21 ?0416 07/31/21 ?0246  ?WBC 8.7  9.6 9.9  ?RBC 3.26* 3.39* 3.26*  ?HGB 11.5* 12.0 11.5*  ?HCT 34.0* 35.4* 34.0*  ?MCV 104.3* 104.4* 104.3*  ?MCH 35.3* 35.4* 35.3*  ?MCHC 33.8 33.9 33.8  ?RDW 14.1 14.1 14.0  ?PLT 185 174 174  ? ? ?Thyroid No results for input(s): TSH, FREET4 in the last 168 hours.  ?BNPNo results for input(s): BNP, PROBNP in the last 168 hours.  ?DDimer No results for input(s): DDIMER in the last 168 hours.  ? ?Radiology  ?  ?DG CHEST PORT 1 VIEW ? ?Result Date: 07/31/2021 ?CLINICAL DATA:  Tachypnea EXAM: PORTABLE CHEST 1 VIEW COMPARISON:  08/13/2010 FINDINGS: The heart size and mediastinal contours are within normal limits. Aortic atherosclerosis. Slightly hyperinflated lungs with chronically coarsened interstitial markings bilaterally. No focal airspace consolidation, pleural effusion, or pneumothorax. The visualized skeletal structures are unremarkable. IMPRESSION: 1. No active disease. 2. Chronic changes of COPD. Electronically Signed   By: Davina Poke D.O.   On: 07/31/2021 08:41    ? ?Cardiac Studies  ? ? ? ?Patient Profile  ?   ?RHYANN SENAT is a 88 y.o. female with a hx of afibb on eliquis, DM2, DVTs, HTN, HLD, Peripheral arterial disease who is being seen 07/28/2021 for the evaluation of Afib RVR at the request of Dr. Virl Cagey.  ? ?Assessment & Plan  ?  ?1.PAF (SR on admission EKG) ?- issues with RVR this admit post op left common fem endarterectomy ?- HRs to 150s over the weekend received IV lopressor and then started on dilt gtt and then started on IV amiodarone drip ?- home regimen is lopressor 50mg  bid, diltaizem 240mg  daily ?- she is on eliquis for stroke prevention, held for recent leg surgery but back on now ?- HR much improved today  ?-TEE/DCCV was canceled yesterday given her MS changes and son's request to only do TEE/DCCV if we could not get HR controlled ?-Heart rate has improved and for the most part is in the 70s but at times will go up into the 110 range ?-Started on amiodarone 200 mg twice daily for heart rate control yesterday ?-continue apixaban 2.5mg  BID (dosed for age>80 and wt < 60kg)  ?-Continue Lopressor 50 mg twice daily and consolidate Cardizem to CD1 240 mg daily.   ?-Stop lisinopril to allow more blood pressure room for titration of heart rate controlling meds if needed ?-Plan to wait 4 weeks and then proceed with DCCV once she recovers from acute illness ? ?2.PAD ?Left common femoral endarterectomy this admission ? ?[facility] will sign off.   ?Medication Recommendations: Amiodarone 200 mg twice daily, apixaban 2.5 mg twice daily, atorvastatin 40 mg daily, Cardizem CD 240 mg daily, Zetia 10 mg daily metoprolol 50 mg twice daily ?Other recommendations (labs, testing, etc): None ?Follow up as an outpatient: Follow-up in atrial fibrillation clinic on Monday and follow-up with Dr. Stanford Breed in 2 to 3 weeks ? ?For questions or updates, please contact [facility] ?Please consult [email or web address] for contact info under  ? ?  ?   ?Signed, ?Fransico Him, MD   ?08/01/2021, 8:01 AM   ? ?

## 2021-08-01 NOTE — Progress Notes (Signed)
Pt HR remains in the mid to upper 120s after pain addressed. Notified MD. Orders for metoprolol 5mg  IV x1 received and administered. Will continue to monitor. ? ?5:59 PM ?No change in pt HR after metoprolol administered. Updated MD. Additional orders for diltiazem 60mg  x1 received and med administered.  ? ?Raelyn Number, RN ? ?

## 2021-08-01 NOTE — Progress Notes (Signed)
?PROGRESS NOTE ? ? ? ?[name]  [id number] DOB: Mar 11, 1933 DOA: 07/26/2021 ?PCP: Lucianne Lei, MD  ?Narrative: [name] is a 88/F w/ h/o afib on Eliquis; DM; HTN; HLD; and CVA (1999) who was admitted on 4/13 with critical limb ischemia of the left leg.  She underwent left common femoral endarterectomy .  Postop course complicated by A-fib RVR requiring amiodarone and Cardizem gtt.  ?-Also noted to be increasingly confused in the hospital, at baseline has intermittent confusion  ?-Cardioversion canceled yesterday on account of confusion ? ?Subjective: ?Feels okay, denies any complaints, per staff she is less confused this morning compared to yesterday ? ?Assessment and Plan: ? ?Critical limb ischemia of left lower extremity ([place]) ?-Management per vascular surgery ?-s/p femoral endarterectomy ?-Discharge planning, SNF recommended by physical therapy ? ?Vascular dementia with delirium ([place]) ?-Patient with neuropsych testing in 02/2021 that showed "significant and severe deficits in all cognitive domains assessed...   The level of impairment noted will strongly indicate the patient will need daily care and observation and is really functional level that would make living independent even if she is regularly checked on a safety concern.  ?-I agree this is hospital delirium, improving ?-Delirium precautions, as needed Haldol added yesterday, fortunately has not required this ? ?Protein-calorie malnutrition, severe ?-Body mass index is 16.83 kg/m?Marland Kitchen.  ?-RD consult, add supplements ? ?ATRIAL FIBRILLATION ?-Remains in A-fib, heart rate improved ?-Transition to oral diltiazem and amiodarone, TEE cardioversion deferred for now ?-Continue apixaban for anticoagulation ? ?Essential hypertension ?-Continue lopressor ? ?Type 2 diabetes mellitus with hyperlipidemia ([place]) ?-CBG stable, continue current dose of Semglee, metformin, Glucotrol was discontinued ? ?DVT prophylaxis: Apixaban ?Code Status: Full  code ?Disposition Plan: SNF per vascular surgery ? ?Consultants:  ? ? ?Procedures:  ? ?Antimicrobials:  ? ? ?Objective: ?Vitals:  ? 08/01/21 0400 08/01/21 0722 08/01/21 0920 08/01/21 1107  ?BP: (!) 145/76 (!) 95/55 (!) 103/58 (!) 118/57  ?Pulse: (!) 115 73 85 84  ?Resp: (!) 21 20  19   ?Temp: 97.6 ?F (36.4 ?C) 97.6 ?F (36.4 ?C)  97.8 ?F (36.6 ?C)  ?TempSrc: Oral Oral  Oral  ?SpO2: 98% 97%  98%  ?Weight:      ?Height:      ? ? ?Intake/Output Summary (Last 24 hours) at 08/01/2021 1116 ?Last data filed at 08/01/2021 0121 ?Gross per 24 hour  ?Intake 1208.53 ml  ?Output 300 ml  ?Net 908.53 ml  ? ?Filed Weights  ? 07/26/21 0609 07/29/21 0400 07/31/21 0751  ?Weight: 38.1 kg 37.8 kg 37.8 kg  ? ? ?Examination: ? ?General exam: Pleasant elderly female sitting up in bed, awake alert oriented to self and partly to place only, moderate cognitive deficits noted ?CVS: S1-S2, regular rate rhythm ?Lungs: Decreased at the bases otherwise clear ?Abdomen: Soft, nontender, bowel sounds present ?Extremities: No edema, left groin unremarkable  ?Skin: No rashes ?Psychiatry:  Mood & affect appropriate.,  Poor insight and judgment ? ? ? ?Data Reviewed:  ? ?CBC: ?Recent Labs  ?Lab 07/26/21 ?1131 07/27/21 ?0133 07/30/21 ?0416 07/31/21 ?0246 08/01/21 ?[id number]  ?WBC 9.2 8.7 9.6 9.9 7.2  ?NEUTROABS  --   --   --  7.5  --   ?HGB 11.9* 11.5* 12.0 11.5* 10.7*  ?HCT 35.4* 34.0* 35.4* 34.0* 30.9*  ?MCV 105.0* 104.3* 104.4* 104.3* 103.7*  ?PLT 174 185 174 174 175  ? ?Basic Metabolic Panel: ?Recent Labs  ?Lab 07/26/21 ?1131 07/27/21 ?0133 07/30/21 ?0416 08/01/21 ?[id number]  ?NA  --  135 138  134*  ?K  --  3.8 3.9 3.9  ?CL  --  100 101 99  ?CO2  --  27 28 28   ?GLUCOSE  --  263* 158* 158*  ?BUN  --  16 22 30*  ?CREATININE 0.72 0.84 0.75 1.02*  ?CALCIUM  --  8.8* 9.3 8.9  ?MG  --   --  1.6*  --   ? ?GFR: ?Estimated Creatinine Clearance: 23.6 mL/min (A) (by C-G formula based on SCr of 1.02 mg/dL (H)). ?Liver Function Tests: ?No results for input(s): AST, ALT, ALKPHOS,  BILITOT, PROT, ALBUMIN in the last 168 hours. ?No results for input(s): LIPASE, AMYLASE in the last 168 hours. ?No results for input(s): AMMONIA in the last 168 hours. ?Coagulation Profile: ?Recent Labs  ?Lab 07/31/21 ?[id number]  ?INR 1.3*  ? ?Cardiac Enzymes: ?No results for input(s): CKTOTAL, CKMB, CKMBINDEX, TROPONINI in the last 168 hours. ?BNP (last 3 results) ?No results for input(s): PROBNP in the last 8760 hours. ?HbA1C: ?No results for input(s): HGBA1C in the last 72 hours. ?CBG: ?Recent Labs  ?Lab 07/31/21 ?1147 07/31/21 ?1557 07/31/21 ?2119 08/01/21 ?0603 08/01/21 ?1111  ?GLUCAP 210* 223* 260* 217* 130*  ? ?Lipid Profile: ?No results for input(s): CHOL, HDL, LDLCALC, TRIG, CHOLHDL, LDLDIRECT in the last 72 hours. ?Thyroid Function Tests: ?No results for input(s): TSH, T4TOTAL, FREET4, T3FREE, THYROIDAB in the last 72 hours. ?Anemia Panel: ?No results for input(s): VITAMINB12, FOLATE, FERRITIN, TIBC, IRON, RETICCTPCT in the last 72 hours. ?Urine analysis: ?   ?Component Value Date/Time  ? COLORURINE COLORLESS (A) 07/28/2021 1742  ? APPEARANCEUR CLEAR 07/28/2021 1742  ? LABSPEC 1.003 (L) 07/28/2021 1742  ? PHURINE 9.0 (H) 07/28/2021 1742  ? GLUCOSEU NEGATIVE 07/28/2021 1742  ? HGBUR NEGATIVE 07/28/2021 1742  ? [place] NEGATIVE 07/28/2021 1742  ? [place] NEGATIVE 07/28/2021 1742  ? PROTEINUR NEGATIVE 07/28/2021 1742  ? UROBILINOGEN 0.2 10/28/2007 0214  ? NITRITE NEGATIVE 07/28/2021 1742  ? LEUKOCYTESUR NEGATIVE 07/28/2021 1742  ? ?Sepsis Labs: ?@[email or web address] (procalcitonin:4,lacticidven:4) ? ?) ?Recent Results (from the past 240 hour(s))  ?Surgical PCR Screen     Status: None  ? Collection Time: 07/24/21  3:37 PM  ? Specimen: Nasal Mucosa; Nasal Swab  ?Result Value Ref Range Status  ? MRSA, PCR NEGATIVE NEGATIVE Final  ? Staphylococcus aureus NEGATIVE NEGATIVE Final  ?  Comment: (NOTE) ?The Xpert SA Assay (FDA approved for NASAL specimens in patients 75 ?years of age and older), is one component of a  comprehensive ?surveillance program. It is not intended to diagnose infection nor to ?guide or monitor treatment. ?Performed at [facility], [place] [address], [place], [place] ?[address] ?  ?  ? ?Radiology Studies: ?DG CHEST PORT 1 VIEW ? ?Result Date: 07/31/2021 ?CLINICAL DATA:  Tachypnea EXAM: PORTABLE CHEST 1 VIEW COMPARISON:  08/13/2010 FINDINGS: The heart size and mediastinal contours are within normal limits. Aortic atherosclerosis. Slightly hyperinflated lungs with chronically coarsened interstitial markings bilaterally. No focal airspace consolidation, pleural effusion, or pneumothorax. The visualized skeletal structures are unremarkable. IMPRESSION: 1. No active disease. 2. Chronic changes of COPD. Electronically Signed   By: Davina Poke D.O.   On: 07/31/2021 08:41   ? ? ?Scheduled Meds: ? amiodarone  200 mg Oral BID  ? apixaban  2.5 mg Oral BID  ? atorvastatin  40 mg Oral Daily  ? diltiazem  240 mg Oral Daily  ? docusate sodium  100 mg Oral Daily  ? ezetimibe  10 mg Oral Daily  ? feeding supplement  237 mL Oral TID BM  ? insulin aspart  0-9 Units Subcutaneous TID WC  ? insulin glargine-yfgn  12 Units Subcutaneous QHS  ? metFORMIN  1,000 mg Oral BID WC  ? metoprolol tartrate  50 mg Oral BID  ? multivitamin with minerals  1 tablet Oral Daily  ? pantoprazole  40 mg Oral Daily  ? ?Continuous Infusions: ? magnesium sulfate bolus IVPB    ? ? ? LOS: 6 days  ? ? ?Time spent: 37min ? ? ? ?Domenic Polite, MD ?[facility] ? ? ?08/01/2021, 11:16 AM  ?  ?

## 2021-08-01 NOTE — Progress Notes (Signed)
BP at 0000 was 84/50.  Nurse gave the ordered PRN IV Normal Saline 500 mL bolus twice.  Her BP at 0019 was 91/54 in one arm and 89/53 in the other.  Her heart rate is 60s-70s, with occasional drops to 50s. Not symptomatic. Informed the cardiologist who said it's okay to leave it alone for now while the patient is asymptomatic.  Will continue to monitor. ? ?Harriet Masson, RN ? ?

## 2021-08-02 DIAGNOSIS — I4891 Unspecified atrial fibrillation: Secondary | ICD-10-CM | POA: Diagnosis not present

## 2021-08-02 DIAGNOSIS — I10 Essential (primary) hypertension: Secondary | ICD-10-CM | POA: Diagnosis not present

## 2021-08-02 LAB — CBC
HCT: 32.9 % — ABNORMAL LOW (ref 36.0–46.0)
Hemoglobin: 11 g/dL — ABNORMAL LOW (ref 12.0–15.0)
MCH: 34.8 pg — ABNORMAL HIGH (ref 26.0–34.0)
MCHC: 33.4 g/dL (ref 30.0–36.0)
MCV: 104.1 fL — ABNORMAL HIGH (ref 80.0–100.0)
Platelets: 211 10*3/uL (ref 150–400)
RBC: 3.16 MIL/uL — ABNORMAL LOW (ref 3.87–5.11)
RDW: 13.7 % (ref 11.5–15.5)
WBC: 7.2 10*3/uL (ref 4.0–10.5)
nRBC: 0 % (ref 0.0–0.2)

## 2021-08-02 LAB — BASIC METABOLIC PANEL
Anion gap: 9 (ref 5–15)
BUN: 28 mg/dL — ABNORMAL HIGH (ref 8–23)
CO2: 26 mmol/L (ref 22–32)
Calcium: 9.2 mg/dL (ref 8.9–10.3)
Chloride: 99 mmol/L (ref 98–111)
Creatinine, Ser: 0.94 mg/dL (ref 0.44–1.00)
GFR, Estimated: 59 mL/min — ABNORMAL LOW (ref >60)
Glucose, Bld: 136 mg/dL — ABNORMAL HIGH (ref 70–99)
Potassium: 4.9 mmol/L (ref 3.5–5.1)
Sodium: 134 mmol/L — ABNORMAL LOW (ref 135–145)

## 2021-08-02 LAB — GLUCOSE, CAPILLARY
Glucose-Capillary: 159 mg/dL — ABNORMAL HIGH (ref 70–99)
Glucose-Capillary: 185 mg/dL — ABNORMAL HIGH (ref 70–99)
Glucose-Capillary: 300 mg/dL — ABNORMAL HIGH (ref 70–99)
Glucose-Capillary: 270 mg/dL — ABNORMAL HIGH (ref 70–99)

## 2021-08-02 LAB — MAGNESIUM: Magnesium: 1.8 mg/dL (ref 1.7–2.4)

## 2021-08-02 MED ORDER — INSULIN GLARGINE-YFGN 100 UNIT/ML ~~LOC~~ SOLN
14.0000 [IU] | Freq: Every day | SUBCUTANEOUS | Status: DC
  Administered 2021-08-02: 14 [IU] via SUBCUTANEOUS
  Filled 2021-08-02 (×3): qty 0.14

## 2021-08-02 MED ORDER — LATANOPROST 0.005 % OP SOLN
1.0000 [drp] | Freq: Every day | OPHTHALMIC | Status: DC
  Administered 2021-08-02 – 2021-08-08 (×7): 1 [drp] via OPHTHALMIC
  Filled 2021-08-02: qty 2.5

## 2021-08-02 MED ORDER — DILTIAZEM HCL ER COATED BEADS 180 MG PO CP24
300.0000 mg | ORAL_CAPSULE | Freq: Every day | ORAL | Status: DC
  Administered 2021-08-02: 300 mg via ORAL
  Filled 2021-08-02: qty 1

## 2021-08-02 NOTE — Progress Notes (Addendum)
?  Progress Note ? ? ? ?08/02/2021 ?8:00 AM ?2 Days Post-Op ? ?Subjective:  no complaints. Says she is just " stiff". Overnight events noted ? ? ?Vitals:  ? 08/02/21 0328 08/02/21 0713  ?BP: (!) 113/57 124/77  ?Pulse: 62 (!) 109  ?Resp: 13 20  ?Temp: 97.6 ?F (36.4 ?C) 97.6 ?F (36.4 ?C)  ?SpO2: 97% 99%  ? ?Physical Exam: ?Cardiac:  regular ?Lungs:  non labored ?Incisions:  left groin is c/d/I without swelling or hematoma ?Extremities:  BLE well perfused and warm. Brisk left PT and Palpable right DP ?Abdomen:  soft, and non distended ?Neurologic: alert  ? ?CBC ?   ?Component Value Date/Time  ? WBC 7.2 08/02/2021 0248  ? RBC 3.16 (L) 08/02/2021 0248  ? HGB 11.0 (L) 08/02/2021 0248  ? HGB 13.9 12/04/2020 0824  ? HCT 32.9 (L) 08/02/2021 0248  ? HCT 42.4 12/04/2020 0824  ? PLT 211 08/02/2021 0248  ? PLT 224 12/04/2020 0824  ? MCV 104.1 (H) 08/02/2021 0248  ? MCV 101 (H) 12/04/2020 0824  ? MCH 34.8 (H) 08/02/2021 0248  ? MCHC 33.4 08/02/2021 0248  ? RDW 13.7 08/02/2021 0248  ? RDW 11.9 12/04/2020 0824  ? LYMPHSABS 0.9 07/31/2021 0246  ? LYMPHSABS 1.7 04/22/2019 1420  ? MONOABS 1.5 (H) 07/31/2021 0246  ? EOSABS 0.0 07/31/2021 0246  ? EOSABS 0.1 04/22/2019 1420  ? BASOSABS 0.0 07/31/2021 0246  ? BASOSABS 0.0 04/22/2019 1420  ? ? ?BMET ?   ?Component Value Date/Time  ? NA 134 (L) 08/02/2021 0248  ? NA 142 12/04/2020 0824  ? K 4.9 08/02/2021 0248  ? CL 99 08/02/2021 0248  ? CO2 26 08/02/2021 0248  ? GLUCOSE 136 (H) 08/02/2021 0248  ? BUN 28 (H) 08/02/2021 0248  ? BUN 21 12/04/2020 0824  ? CREATININE 0.94 08/02/2021 0248  ? CREATININE 0.79 03/21/2016 1116  ? CALCIUM 9.2 08/02/2021 0248  ? GFRNONAA 59 (L) 08/02/2021 0248  ? GFRAA >60 06/26/2019 0457  ? ? ?INR ?   ?Component Value Date/Time  ? INR 1.3 (H) 07/31/2021 [id number]  ? INR 4.3 06/06/2010 1010  ? ? ? ?Intake/Output Summary (Last 24 hours) at 08/02/2021 0800 ?Last data filed at 08/02/2021 [id number] ?Gross per 24 hour  ?Intake 360 ml  ?Output 1000 ml  ?Net -640 ml   ? ? ? ?Assessment/Plan:  88 y.o. female is s/p L CFA Endarterectomy  2 Days Post-Op  ? ?Left groin incision continues to look well. LLE well perfused with Doppler PT signal ?Remains hemodynamically stable ?Appreciate TRH assistance with medical management ?Cardiology signed off yesterday but Will ask cardiology to re evaluate due to increased rates overnight ?Remains stable from vascular standpoint ?TOC to assist with SNF placement ? ? ?Karoline Caldwell, PA-C ?[facility] ?[phone] ?08/02/2021 ?8:00 AM ? ?VASCULAR STAFF ADDENDUM: ?I have independently interviewed and examined the patient. ?I agree with the above.  ?Better than yesterday, Afib, but asymptomatic  ?Pain improved. Son to bring in back brace.  ?Appreciate cardiology weighing in  - will hold off on TEE, son did not feel comfortable with pt age and possible complications. ?Wound site looks excellent  ? ?J. Melene Muller, MD ?[facility] ?Office Phone Number: [phone] ?08/02/2021 7:14 PM ? ? ?

## 2021-08-02 NOTE — Care Management Important Message (Signed)
Important Message ? ?Patient Details  ?Name: [name] ?MRN: [id number] ?Date of Birth: 1932-12-11 ? ? ?Medicare Important Message Given:  Yes ? ? ? ? ?Renie Ora ?08/02/2021, 9:06 AM ?

## 2021-08-02 NOTE — TOC Progression Note (Signed)
Transition of Care (TOC) - Progression Note  ? ? ?Patient Details  ?Name: [name] ?MRN: [id number] ?Date of Birth: 1933/03/11 ? ?Transition of Care (TOC) CM/SW Contact  ?Eduard Roux, LCSW ?Phone Number: ?08/02/2021, 11:52 AM ? ?Clinical Narrative:    ? ?TOC continues to follow and assist with discharge planning. ? ?Antony Blackbird, MSW, LCSW ?Clinical Social Worker ? ? ? ?Expected Discharge Plan: Skilled Nursing Facility ?Barriers to Discharge: Continued Medical Work up, English as a second language teacher, SNF Pending bed offer ? ?Expected Discharge Plan and Services ?Expected Discharge Plan: Skilled Nursing Facility ?  ?  ?Post Acute Care Choice: Skilled Nursing Facility ?Living arrangements for the past 2 months: Single Family Home ?                ?  ?  ?  ?  ?  ?  ?  ?  ?  ?  ? ? ?Social Determinants of Health (SDOH) Interventions ?  ? ?Readmission Risk Interventions ?   ? View : No data to display.  ?  ?  ?  ? ? ?

## 2021-08-02 NOTE — Progress Notes (Signed)
Physical Therapy Treatment ?Patient Details ?Name: [name] ?MRN: [id number] ?DOB: November 16, 1932 ?Today's Date: 08/02/2021 ? ? ?History of Present Illness 88 y.o. female who presented 07/26/21 for L CFA endarterectomy secondary to [place] 4 critical limb ischemia in the left leg. Code Stroke called after surgery when pt was disoriented and with L facial droop. MRI of head was negative with neuro signing off and indicating she would benefit from an outpatient dementia evaluation. Now she is in Afib RV/Aflutter. PMH: afib, DM, DVT, HTN, HLD, PAD, CVA. ? ?  ?PT Comments  ? ? Pt received in supine, alert and pleasantly agreeable to participation in therapy session. Pt slow processing instructions and needing increased time to initiate/perform tasks with multimodal cues given due to anxiety/fear of pain with mobility. Pt making painful sounds at times with transfers and mobility but then denies pain and also demonstrates difficulty maintaining upright trunk while standing today, with smaller/more shuffled steps which limited gait distance. Pt given chair follow for safety and up to min/modA for transfers and gait progression. Pt also able to perform reciprocal sit<>stands with emphasis on delayed recall of safety cues for UE placement and standing hip flexion for strengthening. Pt positioned with multiple pillows in recliner for pressure relief/comfort, son present in room and defers chair alarm on as he will remain with her. Attempted to notify NT about pt needing new purewick but NT busy so PTA placed new purewick for her. Pt continues to benefit from PT services to progress toward functional mobility goals.    ?Recommendations for follow up therapy are one component of a multi-disciplinary discharge planning process, led by the attending physician.  Recommendations may be updated based on patient status, additional functional criteria and insurance authorization. ? ?Follow Up Recommendations ? Skilled  nursing-short term rehab (<3 hours/day) ?  ?  ?Assistance Recommended at Discharge Frequent or constant Supervision/Assistance  ?Patient can return home with the following A little help with walking and/or transfers;A little help with bathing/dressing/bathroom;Assistance with cooking/housework;Direct supervision/assist for medications management;Direct supervision/assist for financial management;Assist for transportation;Help with stairs or ramp for entrance ?  ?Equipment Recommendations ? None recommended by PT  ?  ?Recommendations for Other Services   ? ? ?  ?Precautions / Restrictions Precautions ?Precautions: Fall ?Precaution Comments: monitor HR (tachy) ?Restrictions ?Weight Bearing Restrictions: No  ?  ? ?Mobility ? Bed Mobility ?Overal bed mobility: Needs Assistance ?Bed Mobility: Supine to Sit ?  ?  ?Supine to sit: Min assist, HOB elevated, +2 for safety/equipment ?  ?  ?General bed mobility comments: Pt placing hands on bedrails to raise trunk and needed minA to scoot hips to foot flat. ?  ? ?Transfers ?Overall transfer level: Needs assistance ?Equipment used: Rolling walker (2 wheels) ?Transfers: Sit to/from Stand ?Sit to Stand: Min assist, Mod assist, +2 safety/equipment ?  ?General transfer comment: improved initiation, pt not able to follow instructions to push from bed so second person assisting to stabilize RW for safety and managing lines. from EOB>RW and chair<>RW x7 total reps ?  ? ?Ambulation/Gait ?Ambulation/Gait assistance: Min assist, +2 safety/equipment, Mod assist ?Gait Distance (Feet): 12 Feet ?Assistive device: Rolling walker (2 wheels) ?Gait Pattern/deviations: Step-to pattern, Decreased step length - right, Decreased step length - left, Decreased stride length, Shuffle, Trunk flexed, Narrow base of support, Decreased dorsiflexion - right, Decreased dorsiflexion - left ? General Gait Details: pt needing increased assist to advance RW and tactile cues for improved neck/hip/trunk extension  throughout due to increased "crouched" posture this date;  very small shuffled steps today with poor foot clearance bilaterally ?  ?Pre-gait activities: standing hip flexion x10 reps at RW ?  ? ? ? ?  ?Balance Overall balance assessment: Needs assistance ?Sitting-balance support: Feet supported, Single extremity supported ?Sitting balance-Leahy Scale: Poor ?Sitting balance - Comments: min guard for safety, pt self-supporting with 1-2 UE ?Postural control: Other (comment) (anterior) ?Standing balance support: Bilateral upper extremity supported, During functional activity, Reliant on assistive device for balance ?Standing balance-Leahy Scale: Poor ?Standing balance comment: reliant on RW but only needing +1 physical assist to manage RW/external assist (chair follow for safety) ?  ?  ? ?  ?Cognition Arousal/Alertness: Awake/alert ?Behavior During Therapy: Anxious ?Overall Cognitive Status: History of cognitive impairments - at baseline ?  ?  ?  ?General Comments: Son reporting that pt's orientation and cognition varies throughout the day and week. He has to provide orientation each morning and cues for functional tasks. pt benefits from multimodal cues and slow pacing of instructions; Pt only following 1-step cues this date with increased time and appearing more fatigued.  ?  ? ?  ?Exercises   ? ?  ?General Comments   ?  ?  ? ?Pertinent Vitals/Pain Pain Assessment ?Faces Pain Scale: Hurts even more ?Pain Location: back during gait trial; also reacts due to surprise so at times inconsistent; pain appearing to increase as gait progressed ?Pain Descriptors / Indicators: Discomfort, Grimacing, Moaning  ? ? ? ?PT Goals (current goals can now be found in the care plan section) Acute Rehab PT Goals ?Patient Stated Goal: decreased pain ?PT Goal Formulation: With patient/family ?Time For Goal Achievement: 08/10/21 ?Progress towards PT goals: Progressing toward goals ? ?  ?Frequency ? ? ? Min 3X/week ? ? ? ?  ?PT Plan Current  plan remains appropriate  ? ? ?   ?AM-PAC PT "6 Clicks" Mobility   ?Outcome Measure ? Help needed turning from your back to your side while in a flat bed without using bedrails?: A Lot ?Help needed moving from lying on your back to sitting on the side of a flat bed without using bedrails?: A Little ?Help needed moving to and from a bed to a chair (including a wheelchair)?: A Lot (mod cues) ?Help needed standing up from a chair using your arms (e.g., wheelchair or bedside chair)?: A Lot ?Help needed to walk in hospital room?: A Lot ?Help needed climbing 3-5 steps with a railing? : Total ?6 Click Score: 12 ? ?  ?End of Session Equipment Utilized During Treatment: Gait belt ?Activity Tolerance: Patient tolerated treatment well ?Patient left: in chair;with call bell/phone within reach;with family/visitor present;Other (comment) (per family they will be in room until 9pm, RN notified chair alarm not on due to pt anxiety and family will be present constantly) ?Nurse Communication: Mobility status;Other (comment) (+2 for safety to return to bed) ?PT Visit Diagnosis: Unsteadiness on feet (R26.81);Other abnormalities of gait and mobility (R26.89);Muscle weakness (generalized) (M62.81);Difficulty in walking, not elsewhere classified (R26.2) ?  ? ? ?Time: 6387-5643 ?PT Time Calculation (min) (ACUTE ONLY): 41 min ? ?Charges:  $Gait Training: 8-22 mins ?$Therapeutic Exercise: 8-22 mins ?$Therapeutic Activity: 8-22 mins          ?          ? ?Kristoph Sattler P., PTA ?Acute Rehabilitation Services ?Secure Chat Preferred 9a-5:30pm ?Office: [phone]  ? ? ?Dorathy Kinsman Karan Ramnauth ?08/02/2021, 2:00 PM ? ?

## 2021-08-02 NOTE — Progress Notes (Signed)
?PROGRESS NOTE ? ? ? ?[name]  [id number] DOB: [birth year] DOA: 07/26/2021 ?PCP: Lucianne Lei, MD  ?Narrative: [name] is a 88/F w/ h/o afib on Eliquis; DM; HTN; HLD; and CVA (1999) who was admitted on 4/13 with critical limb ischemia of the left leg.  She underwent left common femoral endarterectomy .  Postop course complicated by A-fib RVR requiring amiodarone and Cardizem gtt.  ?-Also noted to be increasingly confused in the hospital, at baseline has intermittent confusion  ?-Cardioversion canceled on account of confusion ? ?Subjective: ?Issues with A-fib RVR yesterday afternoon, otherwise overall improving ? ?Assessment and Plan: ? ?Critical limb ischemia of left lower extremity ([place]) ?-Management per vascular surgery ?-s/p femoral endarterectomy ?-Discharge planning, SNF recommended by physical therapy ? ?Vascular dementia with delirium ([place]) ?-Patient with neuropsych testing in 02/2021 that showed "significant and severe deficits in all cognitive domains assessed...   The level of impairment noted will strongly indicate the patient will need daily care and observation and is really functional level that would make living independent even if she is regularly checked on a safety concern.  ?-Hospital delirium improving  ?-Delirium precautions ? ?Protein-calorie malnutrition, severe ?-Body mass index is 16.83 kg/m?[place] Kitchen.  ?-RD consult, add supplements ? ?ATRIAL FIBRILLATION with RVR ?-Remains in A-fib, heart rate was elevated yesterday evening ?-Seen by cardiology earlier this admission transition to oral diltiazem and amiodarone, TEE cardioversion deferred for now, also remains on oral metoprolol ?-Increase Cardizem dose ?-Continue apixaban for anticoagulation ? ?Essential hypertension ?-Continue lopressor ? ?Type 2 diabetes mellitus with hyperlipidemia ([place]) ?-CBG stable, continue current dose of Semglee, metformin, Glucotrol was discontinued ? ?DVT prophylaxis: Apixaban ?Code Status: Full  code ?Disposition Plan: SNF per vascular surgery ? ?Consultants:  ? ? ?Procedures:  ? ?Antimicrobials:  ? ? ?Objective: ?Vitals:  ? 08/01/21 1621 08/01/21 2315 08/02/21 0328 08/02/21 [id number]  ?BP: 122/76 132/79 (!) 113/57 124/77  ?Pulse: (!) 126 (!) 126 62 (!) 109  ?Resp: 20 20 13 20   ?Temp: 97.6 ?F (36.4 ?C) (!) 97 ?F (36.1 ?C) 97.6 ?F (36.4 ?C) 97.6 ?F (36.4 ?C)  ?TempSrc: Oral Oral Oral Oral  ?SpO2: 98% 97% 97% 99%  ?Weight:      ?Height:      ? ? ?Intake/Output Summary (Last 24 hours) at 08/02/2021 1054 ?Last data filed at 08/02/2021 [id number] ?Gross per 24 hour  ?Intake 360 ml  ?Output 1000 ml  ?Net -640 ml  ? ?Filed Weights  ? 07/26/21 0609 07/29/21 0400 07/31/21 0751  ?Weight: 38.1 kg 37.8 kg 37.8 kg  ? ? ?Examination: ? ?General exam: Pleasant elderly female sitting up in bed, awake alert oriented to self and partly to place only, moderate cognitive deficits noted ?CVS: S1-S2, regular rate rhythm ?Lungs: Decreased at the bases otherwise clear ?Abdomen: Soft, nontender, bowel sounds present ?Extremities: No edema, left groin unremarkable  ?Skin: No rashes ?Psychiatry:  Mood & affect appropriate.,  Poor insight and judgment ? ? ? ?Data Reviewed:  ? ?CBC: ?Recent Labs  ?Lab 07/27/21 ?0133 07/30/21 ?0416 07/31/21 ?0246 08/01/21 ?[id number] 08/02/21 ?0248  ?WBC 8.7 9.6 9.9 7.2 7.2  ?NEUTROABS  --   --  7.5  --   --   ?HGB 11.5* 12.0 11.5* 10.7* 11.0*  ?HCT 34.0* 35.4* 34.0* 30.9* 32.9*  ?MCV 104.3* 104.4* 104.3* 103.7* 104.1*  ?PLT 185 174 174 175 211  ? ?Basic Metabolic Panel: ?Recent Labs  ?Lab 07/26/21 ?1131 07/27/21 ?0133 07/30/21 ?0416 08/01/21 ?[id number] 08/02/21 ?0248  ?NA  --  135 138 134* 134*  ?K  --  3.8 3.9 3.9 4.9  ?CL  --  100 101 99 99  ?CO2  --  27 28 28 26   ?GLUCOSE  --  263* 158* 158* 136*  ?BUN  --  16 22 30* 28*  ?CREATININE 0.72 0.84 0.75 1.02* 0.94  ?CALCIUM  --  8.8* 9.3 8.9 9.2  ?MG  --   --  1.6*  --  1.8  ? ?GFR: ?Estimated Creatinine Clearance: 25.6 mL/min (by C-G formula based on SCr of 0.94 mg/dL). ?Liver  Function Tests: ?No results for input(s): AST, ALT, ALKPHOS, BILITOT, PROT, ALBUMIN in the last 168 hours. ?No results for input(s): LIPASE, AMYLASE in the last 168 hours. ?No results for input(s): AMMONIA in the last 168 hours. ?Coagulation Profile: ?Recent Labs  ?Lab 07/31/21 ?[id number]  ?INR 1.3*  ? ?Cardiac Enzymes: ?No results for input(s): CKTOTAL, CKMB, CKMBINDEX, TROPONINI in the last 168 hours. ?BNP (last 3 results) ?No results for input(s): PROBNP in the last 8760 hours. ?HbA1C: ?No results for input(s): HGBA1C in the last 72 hours. ?CBG: ?Recent Labs  ?Lab 08/01/21 ?0603 08/01/21 ?1111 08/01/21 ?1621 08/01/21 ?2045 08/02/21 ?[id number]  ?GLUCAP 217* 130* 166* 156* 159*  ? ?Lipid Profile: ?No results for input(s): CHOL, HDL, LDLCALC, TRIG, CHOLHDL, LDLDIRECT in the last 72 hours. ?Thyroid Function Tests: ?No results for input(s): TSH, T4TOTAL, FREET4, T3FREE, THYROIDAB in the last 72 hours. ?Anemia Panel: ?No results for input(s): VITAMINB12, FOLATE, FERRITIN, TIBC, IRON, RETICCTPCT in the last 72 hours. ?Urine analysis: ?   ?Component Value Date/Time  ? COLORURINE COLORLESS (A) 07/28/2021 1742  ? APPEARANCEUR CLEAR 07/28/2021 1742  ? LABSPEC 1.003 (L) 07/28/2021 1742  ? PHURINE 9.0 (H) 07/28/2021 1742  ? GLUCOSEU NEGATIVE 07/28/2021 1742  ? HGBUR NEGATIVE 07/28/2021 1742  ? [place] NEGATIVE 07/28/2021 1742  ? [place] NEGATIVE 07/28/2021 1742  ? PROTEINUR NEGATIVE 07/28/2021 1742  ? UROBILINOGEN 0.2 10/28/2007 0214  ? NITRITE NEGATIVE 07/28/2021 1742  ? LEUKOCYTESUR NEGATIVE 07/28/2021 1742  ? ?Sepsis Labs: ?@[email or web address] (procalcitonin:4,lacticidven:4) ? ?) ?Recent Results (from the past 240 hour(s))  ?Surgical PCR Screen     Status: None  ? Collection Time: 07/24/21  3:37 PM  ? Specimen: Nasal Mucosa; Nasal Swab  ?Result Value Ref Range Status  ? MRSA, PCR NEGATIVE NEGATIVE Final  ? Staphylococcus aureus NEGATIVE NEGATIVE Final  ?  Comment: (NOTE) ?The Xpert SA Assay (FDA approved for NASAL specimens in patients  9 ?years of age and older), is one component of a comprehensive ?surveillance program. It is not intended to diagnose infection nor to ?guide or monitor treatment. ?Performed at [facility], [place] [address], [place], [place] ?[address] ?  ?  ? ?Radiology Studies: ?No results found. ? ? ?Scheduled Meds: ? amiodarone  200 mg Oral BID  ? apixaban  2.5 mg Oral BID  ? atorvastatin  40 mg Oral Daily  ? diltiazem  300 mg Oral Daily  ? docusate sodium  100 mg Oral Daily  ? ezetimibe  10 mg Oral Daily  ? feeding supplement  237 mL Oral TID BM  ? insulin aspart  0-9 Units Subcutaneous TID WC  ? insulin glargine-yfgn  14 Units Subcutaneous QHS  ? metFORMIN  1,000 mg Oral BID WC  ? metoprolol tartrate  50 mg Oral BID  ? multivitamin with minerals  1 tablet Oral Daily  ? pantoprazole  40 mg Oral Daily  ? ?Continuous Infusions: ? magnesium sulfate bolus IVPB    ? ? ?  LOS: 7 days  ? ? ?Time spent: 58min ? ? ? ?Domenic Polite, MD ?[facility] ? ? ?08/02/2021, 10:54 AM  ?  ?

## 2021-08-02 NOTE — Progress Notes (Signed)
TRH night cross cover note: ? ?I was notified by RN regarding perpetuation of patient's atrial fibrillation with heart rates in the 120s. ? ?Per my chart review, including review of most recent rounding hospitalist progress note, this is a 88 year old female with known history of atrial fibrillation with intermittent RVR over the course of her 6-day hospitalization.  ? ?Starting around 1500 on 08/01/2021, the patient has been exhibiting  heart rates in the 120s a/w her atrial fibrillation.  This is relative to sustained heart rates in the 150s approximately 72 hours ago. cardiology previously consulted, with recommendations to continue amiodarone 200 mg p.o. twice daily, Cardizem 240 mg p.o. daily, and metoprolol tartrate 50 mg p.o. twice daily.  In the setting of heart rates in the 120s, the patient had previously been ordered an extra dose of Cardizem 60 mg p.o. x1, which was administered around 1844 on 08/01/2021.  However, she reportedly vomited shortly after taking this medication.  She has subsequently received Zofran, with ensuing resolution of her nausea, following which she has received and tolerated her scheduled evening oral amiodarone as well as her evening oral metoprolol tartrate, without any ensuing vomiting. ? ?Sustained heart rates continue to be in the 120s, with most recent blood pressure 132/79.  Patient without any acute complaints at this time, including no report of any acute chest pain, palpitations, shortness of breath. ? ?In the setting of this patient with known history of atrial fibrillation and intermittent RVR over the course of this hospitalization, with sustained heart rates in the 120s, with blood pressure tolerating and with the patient remaining asymptomatic from a cardiac standpoint, will reorder Cardizem 60 mg p.o. x1, and monitor for ensuing heart rate response, while continuing the additional AV nodal blocking regimen as recommended via aforementioned cardiology consult. Will  also check serum magnesium level, while noting existing order for BMP to be checked in the morning. ? ? ? ?Babs Bertin, DO ?Hospitalist ? ?

## 2021-08-03 DIAGNOSIS — I10 Essential (primary) hypertension: Secondary | ICD-10-CM | POA: Diagnosis not present

## 2021-08-03 DIAGNOSIS — I4891 Unspecified atrial fibrillation: Secondary | ICD-10-CM | POA: Diagnosis not present

## 2021-08-03 LAB — BASIC METABOLIC PANEL
Anion gap: 9 (ref 5–15)
BUN: 39 mg/dL — ABNORMAL HIGH (ref 8–23)
CO2: 24 mmol/L (ref 22–32)
Calcium: 9.4 mg/dL (ref 8.9–10.3)
Chloride: 104 mmol/L (ref 98–111)
Creatinine, Ser: 1.1 mg/dL — ABNORMAL HIGH (ref 0.44–1.00)
GFR, Estimated: 49 mL/min — ABNORMAL LOW (ref >60)
Glucose, Bld: 323 mg/dL — ABNORMAL HIGH (ref 70–99)
Potassium: 5.1 mmol/L (ref 3.5–5.1)
Sodium: 137 mmol/L (ref 135–145)

## 2021-08-03 LAB — GLUCOSE, CAPILLARY
Glucose-Capillary: 236 mg/dL — ABNORMAL HIGH (ref 70–99)
Glucose-Capillary: 199 mg/dL — ABNORMAL HIGH (ref 70–99)
Glucose-Capillary: 164 mg/dL — ABNORMAL HIGH (ref 70–99)
Glucose-Capillary: 195 mg/dL — ABNORMAL HIGH (ref 70–99)

## 2021-08-03 LAB — CBC
HCT: 30.9 % — ABNORMAL LOW (ref 36.0–46.0)
Hemoglobin: 10.6 g/dL — ABNORMAL LOW (ref 12.0–15.0)
MCH: 36.6 pg — ABNORMAL HIGH (ref 26.0–34.0)
MCHC: 34.3 g/dL (ref 30.0–36.0)
MCV: 106.6 fL — ABNORMAL HIGH (ref 80.0–100.0)
Platelets: 209 10*3/uL (ref 150–400)
RBC: 2.9 MIL/uL — ABNORMAL LOW (ref 3.87–5.11)
RDW: 13.9 % (ref 11.5–15.5)
WBC: 6.5 10*3/uL (ref 4.0–10.5)
nRBC: 0 % (ref 0.0–0.2)

## 2021-08-03 MED ORDER — INSULIN GLARGINE-YFGN 100 UNIT/ML ~~LOC~~ SOLN
20.0000 [IU] | Freq: Every day | SUBCUTANEOUS | Status: DC
  Administered 2021-08-03 – 2021-08-06 (×4): 20 [IU] via SUBCUTANEOUS
  Filled 2021-08-03 (×5): qty 0.2

## 2021-08-03 MED ORDER — DILTIAZEM HCL ER COATED BEADS 180 MG PO CP24
360.0000 mg | ORAL_CAPSULE | Freq: Every day | ORAL | Status: DC
Start: 2021-08-03 — End: 2021-08-07
  Administered 2021-08-03 – 2021-08-06 (×4): 360 mg via ORAL
  Filled 2021-08-03 (×4): qty 2

## 2021-08-03 NOTE — Progress Notes (Addendum)
?  Progress Note ? ? ? ?08/03/2021 ?7:48 AM ?3 Days Post-Op ? ?Subjective:  no complaints ? ? ?Vitals:  ? 08/02/21 2305 08/03/21 0436  ?BP:  108/61  ?Pulse: 76 100  ?Resp:  18  ?Temp: 97.6 ?F (36.4 ?C) 97.7 ?F (36.5 ?C)  ?SpO2: 98% 98%  ? ?Physical Exam: ?Cardiac:  regular ?Lungs:  non labored ?Incisions:  left groin c/d/I without swelling or hematoma  ?Extremities:  BLE well perfused and warm. Doppler left PT/ Pero. Right DP palpable ?Abdomen:  flat, soft ?Neurologic: alert  ? ?CBC ?   ?Component Value Date/Time  ? WBC 7.2 08/02/2021 0248  ? RBC 3.16 (L) 08/02/2021 0248  ? HGB 11.0 (L) 08/02/2021 0248  ? HGB 13.9 12/04/2020 0824  ? HCT 32.9 (L) 08/02/2021 0248  ? HCT 42.4 12/04/2020 0824  ? PLT 211 08/02/2021 0248  ? PLT 224 12/04/2020 0824  ? MCV 104.1 (H) 08/02/2021 0248  ? MCV 101 (H) 12/04/2020 0824  ? MCH 34.8 (H) 08/02/2021 0248  ? MCHC 33.4 08/02/2021 0248  ? RDW 13.7 08/02/2021 0248  ? RDW 11.9 12/04/2020 0824  ? LYMPHSABS 0.9 07/31/2021 0246  ? LYMPHSABS 1.7 04/22/2019 1420  ? MONOABS 1.5 (H) 07/31/2021 0246  ? EOSABS 0.0 07/31/2021 0246  ? EOSABS 0.1 04/22/2019 1420  ? BASOSABS 0.0 07/31/2021 0246  ? BASOSABS 0.0 04/22/2019 1420  ? ? ?BMET ?   ?Component Value Date/Time  ? NA 137 08/03/2021 0122  ? NA 142 12/04/2020 0824  ? K 5.1 08/03/2021 0122  ? CL 104 08/03/2021 0122  ? CO2 24 08/03/2021 0122  ? GLUCOSE 323 (H) 08/03/2021 0122  ? BUN 39 (H) 08/03/2021 0122  ? BUN 21 12/04/2020 0824  ? CREATININE 1.10 (H) 08/03/2021 0122  ? CREATININE 0.79 03/21/2016 1116  ? CALCIUM 9.4 08/03/2021 0122  ? GFRNONAA 49 (L) 08/03/2021 0122  ? GFRAA >60 06/26/2019 0457  ? ? ?INR ?   ?Component Value Date/Time  ? INR 1.3 (H) 07/31/2021 [id number]  ? INR 4.3 06/06/2010 1010  ? ? ? ?Intake/Output Summary (Last 24 hours) at 08/03/2021 0748 ?Last data filed at 08/03/2021 0438 ?Gross per 24 hour  ?Intake 600 ml  ?Output 200 ml  ?Net 400 ml  ? ? ? ?Assessment/Plan:  88 y.o. female is s/p L CFA endarterectomy 3 Days Post-Op  ? ?Left groin  incision is clean, dry and intact. LLE well perfused and warn with Doppler PT/ Pero ?Remains hemodynamically stable ?Remains in Afib with RVR. Continue current medical management ?Appreciate TRH assistance ?Has outpatient Cardiology f/u arranged ?TOC assisting with SNF placement ? ? ?Karoline Caldwell, PA-C ?[facility] ?[id number] ?08/03/2021 ?7:48 AM ? ?VASCULAR STAFF ADDENDUM: ?I have independently interviewed and examined the patient. ?I agree with the above.  ?Afib, asymptomatic, wound healing well.  ?SNF pending. Medically ready for d/c from vascular perspective ? ?J. Melene Muller, MD ?[facility] ?Office Phone Number: [phone] ?08/03/2021 7:59 AM ? ? ?

## 2021-08-03 NOTE — Progress Notes (Signed)
Occupational Therapy Treatment ?Patient Details ?Name: [name] ?MRN: [id number] ?DOB: Jan 16, 1933 ?Today's Date: 08/03/2021 ? ? ?History of present illness 88 y.o. female who presented 07/26/21 for L CFA endarterectomy secondary to [place] 4 critical limb ischemia in the left leg. Code Stroke called after surgery when pt was disoriented and with L facial droop. MRI of head was negative with neuro signing off and indicating she would benefit from an outpatient dementia evaluation. Now she is in Afib RV/Aflutter. PMH: afib, DM, DVT, HTN, HLD, PAD, CVA. ?  ?OT comments ? Pt progressing well towards OT goals. Pt more alert, consistent following of commands and required less physical assist for basic UB ADLs and transfers using RW (Mod A x 1 at most). Pt continues to require step by step sequencing cues to complete each task due to impaired cognition. Recommendations of ST SNF rehab remain appropriate.  ? ?HR 120s sustained  ? ?Recommendations for follow up therapy are one component of a multi-disciplinary discharge planning process, led by the attending physician.  Recommendations may be updated based on patient status, additional functional criteria and insurance authorization. ?   ?Follow Up Recommendations ? Skilled nursing-short term rehab (<3 hours/day)  ?  ?Assistance Recommended at Discharge Frequent or constant Supervision/Assistance  ?Patient can return home with the following ? A lot of help with walking and/or transfers;A lot of help with bathing/dressing/bathroom;Assistance with feeding ?  ?Equipment Recommendations ? BSC/3in1;Wheelchair (measurements OT);Wheelchair cushion (measurements OT)  ?  ?Recommendations for Other Services   ? ?  ?Precautions / Restrictions Precautions ?Precautions: Fall ?Precaution Comments: monitor HR (tachy) ?Restrictions ?Weight Bearing Restrictions: No  ? ? ?  ? ?Mobility Bed Mobility ?Overal bed mobility: Needs Assistance ?Bed Mobility: Supine to Sit ?  ?  ?Supine to  sit: Min assist, HOB elevated ?  ?  ?General bed mobility comments: Pt placing hands on bedrails to raise trunk and needed minA to scoot hips to get feet on floor ?  ? ?Transfers ?Overall transfer level: Needs assistance ?Equipment used: Rolling walker (2 wheels) ?Transfers: Sit to/from Stand, Bed to chair/wheelchair/BSC ?Sit to Stand: Min assist ?  ?  ?Step pivot transfers: Mod assist ?  ?  ?General transfer comment: light Min A to stand from bed with pt L hand on RW and R hand pushing from bed. Mod A for managing RW in turning to chair and step by step cues. very light assist for maintaining balance ?  ?  ?Balance Overall balance assessment: Needs assistance ?Sitting-balance support: Feet supported, Single extremity supported ?Sitting balance-Leahy Scale: Poor ?Sitting balance - Comments: min guard for safety, pt self-supporting with 1-2 UE ?  ?Standing balance support: Bilateral upper extremity supported, During functional activity, Reliant on assistive device for balance ?Standing balance-Leahy Scale: Poor ?Standing balance comment: reliant on RW but only needing +1 physical assist to manage RW/external assist ?  ?  ?  ?  ?  ?  ?  ?  ?  ?  ?  ?   ? ?ADL either performed or assessed with clinical judgement  ? ?ADL Overall ADL's : Needs assistance/impaired ?  ?  ?Grooming: Minimal assistance;Sitting;Oral care;Wash/dry face ?Grooming Details (indicate cue type and reason): Assist to open toothpaste and hold toothbrush for placing toothpaste on top. otherwise, able to complete task with increased time, minor cues for sequencing next steps (rinsing, wiping mouth) ?  ?  ?  ?  ?Upper Body Dressing : Minimal assistance;Sitting ?Upper Body Dressing Details (indicate cue type and  reason): cues to initiate and don around UEs sitting EOB ?Lower Body Dressing: Maximal assistance;Sit to/from stand ?Lower Body Dressing Details (indicate cue type and reason): unable to safely bend to feet sitting EOB d/t short stature to manage  socks ?  ?  ?  ?  ?  ?  ?  ?General ADL Comments: More alert, participatory with focus on slow, one step commands to encourage eased anxiety and decreased physical assist for transfers/ADLs today ?  ? ?Extremity/Trunk Assessment Upper Extremity Assessment ?Upper Extremity Assessment: RUE deficits/detail ?RUE Deficits / Details: difficulty with full grasp due to recent IV infiltration per pt's son.able to form full composite flexion with weak grasp today ?RUE Coordination: decreased fine motor ?  ?Lower Extremity Assessment ?Lower Extremity Assessment: Defer to PT evaluation ?  ?  ?  ? ?Vision   ?Vision Assessment?: No apparent visual deficits ?  ?Perception   ?  ?Praxis   ?  ? ?Cognition Arousal/Alertness: Awake/alert ?Behavior During Therapy: [facility] for tasks assessed/performed, Flat affect ?Overall Cognitive Status: History of cognitive impairments - at baseline ?  ?  ?  ?  ?  ?  ?  ?  ?  ?  ?  ?  ?  ?  ?  ?  ?General Comments: Son reporting that pt's orientation and cognition varies throughout the day and week. He has to provide orientation each morning and cues for functional tasks. pt benefits from multimodal cues and slow pacing of instructions; ?  ?  ?   ?Exercises   ? ?  ?Shoulder Instructions   ? ? ?  ?General Comments Daughter, [name] entering at end of session  ? ? ?Pertinent Vitals/ Pain       Pain Assessment ?Pain Assessment: Faces ?Faces Pain Scale: Hurts a little bit ?Pain Location: mid back with initial bed mobility ?Pain Descriptors / Indicators: Discomfort, Grimacing, Moaning ?Pain Intervention(s): Monitored during session, Limited activity within patient's tolerance, Repositioned ? ?Home Living   ?  ?  ?  ?  ?  ?  ?  ?  ?  ?  ?  ?  ?  ?  ?  ?  ?  ?  ? ?  ?Prior Functioning/Environment    ?  ?  ?  ?   ? ?Frequency ? Min 2X/week  ? ? ? ? ?  ?Progress Toward Goals ? ?OT Goals(current goals can now be found in the care plan section) ? Progress towards OT goals: Progressing toward goals ? ?Acute Rehab OT  Goals ?OT Goal Formulation: With patient/family ?Time For Goal Achievement: 08/10/21 ?Potential to Achieve Goals: Good ?ADL Goals ?Pt Will Perform Grooming: with supervision;standing ?Pt Will Perform Lower Body Dressing: with supervision;sit to/from stand ?Pt Will Transfer to Toilet: with supervision;ambulating;regular height toilet ?Pt Will Perform Toileting - Clothing Manipulation and hygiene: with supervision;sit to/from stand  ?Plan Discharge plan remains appropriate   ? ?Co-evaluation ? ? ?   ?  ?  ?  ?  ? ?  ?AM-PAC OT "6 Clicks" Daily Activity     ?Outcome Measure ? ? Help from another person eating meals?: A Little ?Help from another person taking care of personal grooming?: A Little ?Help from another person toileting, which includes using toliet, bedpan, or urinal?: Total ?Help from another person bathing (including washing, rinsing, drying)?: A Lot ?Help from another person to put on and taking off regular upper body clothing?: A Lot ?Help from another person to put on and taking off regular lower  body clothing?: A Lot ?6 Click Score: 13 ? ?  ?End of Session Equipment Utilized During Treatment: Gait belt;Rolling walker (2 wheels) ? ?OT Visit Diagnosis: Unsteadiness on feet (R26.81);Other abnormalities of gait and mobility (R26.89);Muscle weakness (generalized) (M62.81) ?  ?Activity Tolerance Patient tolerated treatment well ?  ?Patient Left in chair;with call bell/phone within reach;with chair alarm set;with family/visitor present ?  ?Nurse Communication Mobility status ?  ? ?   ? ?Time: [id number] ?OT Time Calculation (min): 38 min ? ?Charges: OT General Charges ?$OT Visit: 1 Visit ?OT Treatments ?$Self Care/Home Management : 8-22 mins ?$Therapeutic Activity: 8-22 mins ? ?Malachy Chamber, OTR/L ?Acute Rehab Services ?Office: [phone]  ? ?[name] ?08/03/2021, 12:11 PM ?

## 2021-08-03 NOTE — Progress Notes (Signed)
?PROGRESS NOTE ? ? ? ?[name]  [id number] DOB: 1932-10-06 DOA: 07/26/2021 ?PCP: Lucianne Lei, MD  ?Narrative: [name] is a 88/F w/ h/o afib on Eliquis; DM; HTN; HLD; and CVA (1999) who was admitted on 4/13 with critical limb ischemia of the left leg.  She underwent left common femoral endarterectomy .  Postop course complicated by A-fib RVR requiring amiodarone and Cardizem gtt.  ?-Also noted to be increasingly confused in the hospital, at baseline has intermittent confusion  ?-Seen by cardiology, cardioversion was planned, but canceled on account of confusion ?-Slowly improving ? ?Subjective: ?-Feels okay, bit tired, denies shortness of breath ? ?Assessment and Plan: ? ?Critical limb ischemia of left lower extremity ([place]) ?-Management per vascular surgery ?-s/p femoral endarterectomy ?-Discharge planning, SNF recommended by physical therapy ? ?Vascular dementia with delirium ([place]) ?-Patient with neuropsych testing in 02/2021 that showed "significant and severe deficits in all cognitive domains assessed...   The level of impairment noted will strongly indicate the patient will need daily care and observation and is really functional level that would make living independent even if she is regularly checked on a safety concern". ?-Hospital delirium improving  ?-Delirium precautions ? ?ATRIAL FIBRILLATION with RVR ?-Remains in A-fib, heart rate was elevated yesterday evening ?-Seen by cardiology earlier this admission transition to oral diltiazem and amiodarone, TEE cardioversion deferred for now, also remains on oral metoprolol ?-Cardiology signed off ?-Heart rate stable overnight, elevated this morning again, increase Cardizem CD dose to 360 Mg daily ?-Continue apixaban for anticoagulation ? ?Essential hypertension ?-Continue lopressor ? ?Type 2 diabetes mellitus with hyperlipidemia ([place]) ?-CBG in 200s, increase Semglee dose, continue metformin, Glucotrol was discontinued ? ?Protein-calorie  malnutrition, severe ?-Body mass index is 16.83 kg/m?Marland Kitchen.  ?-RD consult, add supplements ? ?DVT prophylaxis: Apixaban ?Code Status: Full code ?Disposition Plan: SNF per vascular surgery ? ? ?Objective: ?Vitals:  ? 08/02/21 2001 08/02/21 2038 08/02/21 2305 08/03/21 0436  ?BP: 128/69 (!) 120/57  108/61  ?Pulse: 93 86 76 100  ?Resp: 18   18  ?Temp: 97.9 ?F (36.6 ?C)  97.6 ?F (36.4 ?C) 97.7 ?F (36.5 ?C)  ?TempSrc: Oral  Oral Oral  ?SpO2: 100%  98% 98%  ?Weight:      ?Height:      ? ? ?Intake/Output Summary (Last 24 hours) at 08/03/2021 1032 ?Last data filed at 08/03/2021 0438 ?Gross per 24 hour  ?Intake 600 ml  ?Output 200 ml  ?Net 400 ml  ? ?Filed Weights  ? 07/26/21 0609 07/29/21 0400 07/31/21 0751  ?Weight: 38.1 kg 37.8 kg 37.8 kg  ? ? ?Examination: ? ?General exam: Pleasant elderly female sitting up in bed, awake alert oriented to self and partly to place only, moderate cognitive deficits ?CVS: S1-S2, irregularly irregular rhythm ?Lungs: Decreased breath sounds the bases otherwise clear ?Abdomen: Soft, nontender, bowel sounds present ?Extremities: No edema, left groin unremarkable  ?Skin: No rashes ?Psychiatry:  Mood & affect appropriate.,  Poor insight and judgment ? ? ? ?Data Reviewed:  ? ?CBC: ?Recent Labs  ?Lab 07/30/21 ?0416 07/31/21 ?0246 08/01/21 ?[id number] 08/02/21 ?0248 08/03/21 ?0122  ?WBC 9.6 9.9 7.2 7.2 6.5  ?NEUTROABS  --  7.5  --   --   --   ?HGB 12.0 11.5* 10.7* 11.0* 10.6*  ?HCT 35.4* 34.0* 30.9* 32.9* 30.9*  ?MCV 104.4* 104.3* 103.7* 104.1* 106.6*  ?PLT 174 174 175 211 209  ? ?Basic Metabolic Panel: ?Recent Labs  ?Lab 07/30/21 ?0416 08/01/21 ?[id number] 08/02/21 ?0248 08/03/21 ?0122  ?NA  138 134* 134* 137  ?K 3.9 3.9 4.9 5.1  ?CL 101 99 99 104  ?CO2 28 28 26 24   ?GLUCOSE 158* 158* 136* 323*  ?BUN 22 30* 28* 39*  ?CREATININE 0.75 1.02* 0.94 1.10*  ?CALCIUM 9.3 8.9 9.2 9.4  ?MG 1.6*  --  1.8  --   ? ?GFR: ?Estimated Creatinine Clearance: 21.9 mL/min (A) (by C-G formula based on SCr of 1.1 mg/dL (H)). ?Liver Function  Tests: ?No results for input(s): AST, ALT, ALKPHOS, BILITOT, PROT, ALBUMIN in the last 168 hours. ?No results for input(s): LIPASE, AMYLASE in the last 168 hours. ?No results for input(s): AMMONIA in the last 168 hours. ?Coagulation Profile: ?Recent Labs  ?Lab 07/31/21 ?[id number]  ?INR 1.3*  ? ?Cardiac Enzymes: ?No results for input(s): CKTOTAL, CKMB, CKMBINDEX, TROPONINI in the last 168 hours. ?BNP (last 3 results) ?No results for input(s): PROBNP in the last 8760 hours. ?HbA1C: ?No results for input(s): HGBA1C in the last 72 hours. ?CBG: ?Recent Labs  ?Lab 08/02/21 ?[id number] 08/02/21 ?1124 08/02/21 ?1555 08/02/21 ?2125 08/03/21 ?[id number]  ?GLUCAP 159* 185* 300* 270* 236*  ? ?Lipid Profile: ?No results for input(s): CHOL, HDL, LDLCALC, TRIG, CHOLHDL, LDLDIRECT in the last 72 hours. ?Thyroid Function Tests: ?No results for input(s): TSH, T4TOTAL, FREET4, T3FREE, THYROIDAB in the last 72 hours. ?Anemia Panel: ?No results for input(s): VITAMINB12, FOLATE, FERRITIN, TIBC, IRON, RETICCTPCT in the last 72 hours. ?Urine analysis: ?   ?Component Value Date/Time  ? COLORURINE COLORLESS (A) 07/28/2021 1742  ? APPEARANCEUR CLEAR 07/28/2021 1742  ? LABSPEC 1.003 (L) 07/28/2021 1742  ? PHURINE 9.0 (H) 07/28/2021 1742  ? GLUCOSEU NEGATIVE 07/28/2021 1742  ? HGBUR NEGATIVE 07/28/2021 1742  ? [place] NEGATIVE 07/28/2021 1742  ? [place] NEGATIVE 07/28/2021 1742  ? PROTEINUR NEGATIVE 07/28/2021 1742  ? UROBILINOGEN 0.2 10/28/2007 0214  ? NITRITE NEGATIVE 07/28/2021 1742  ? LEUKOCYTESUR NEGATIVE 07/28/2021 1742  ? ?Sepsis Labs: ?@[email or web address] (procalcitonin:4,lacticidven:4) ? ?) ?Recent Results (from the past 240 hour(s))  ?Surgical PCR Screen     Status: None  ? Collection Time: 07/24/21  3:37 PM  ? Specimen: Nasal Mucosa; Nasal Swab  ?Result Value Ref Range Status  ? MRSA, PCR NEGATIVE NEGATIVE Final  ? Staphylococcus aureus NEGATIVE NEGATIVE Final  ?  Comment: (NOTE) ?The Xpert SA Assay (FDA approved for NASAL specimens in patients 65 ?years  of age and older), is one component of a comprehensive ?surveillance program. It is not intended to diagnose infection nor to ?guide or monitor treatment. ?Performed at [facility], [place] [address], [place], [place] ?[address] ?  ?  ? ?Radiology Studies: ?No results found. ? ? ?Scheduled Meds: ? amiodarone  200 mg Oral BID  ? apixaban  2.5 mg Oral BID  ? atorvastatin  40 mg Oral Daily  ? diltiazem  360 mg Oral Daily  ? docusate sodium  100 mg Oral Daily  ? ezetimibe  10 mg Oral Daily  ? feeding supplement  237 mL Oral TID BM  ? insulin aspart  0-9 Units Subcutaneous TID WC  ? insulin glargine-yfgn  20 Units Subcutaneous QHS  ? latanoprost  1 drop Both Eyes Daily  ? metFORMIN  1,000 mg Oral BID WC  ? metoprolol tartrate  50 mg Oral BID  ? multivitamin with minerals  1 tablet Oral Daily  ? pantoprazole  40 mg Oral Daily  ? ?Continuous Infusions: ? magnesium sulfate bolus IVPB    ? ? ? LOS: 8 days  ? ? ?Time  spent: 73min ? ? ? ?Domenic Polite, MD ?[facility] ? ? ?08/03/2021, 10:32 AM  ?  ?

## 2021-08-03 NOTE — TOC Progression Note (Signed)
Transition of Care (TOC) - Progression Note  ? ? ?Patient Details  ?Name: [name] ?MRN: [id number] ?Date of Birth: 11-Sep-1932 ? ?Transition of Care (TOC) CM/SW Contact  ?Eduard Roux, LCSW ?Phone Number: ?08/03/2021, 1:24 PM ? ?Clinical Narrative:    ? ?Insurance remains pending  ? ?Expected Discharge Plan: Skilled Nursing Facility ?Barriers to Discharge: Continued Medical Work up, English as a second language teacher, SNF Pending bed offer ? ?Expected Discharge Plan and Services ?Expected Discharge Plan: Skilled Nursing Facility ?  ?  ?Post Acute Care Choice: Skilled Nursing Facility ?Living arrangements for the past 2 months: Single Family Home ?                ?  ?  ?  ?  ?  ?  ?  ?  ?  ?  ? ? ?Social Determinants of Health (SDOH) Interventions ?  ? ?Readmission Risk Interventions ?   ? View : No data to display.  ?  ?  ?  ? ? ?

## 2021-08-04 DIAGNOSIS — I70222 Atherosclerosis of native arteries of extremities with rest pain, left leg: Secondary | ICD-10-CM | POA: Diagnosis not present

## 2021-08-04 LAB — BASIC METABOLIC PANEL
Anion gap: 10 (ref 5–15)
BUN: 23 mg/dL (ref 8–23)
CO2: 27 mmol/L (ref 22–32)
Calcium: 9.2 mg/dL (ref 8.9–10.3)
Chloride: 99 mmol/L (ref 98–111)
Creatinine, Ser: 0.81 mg/dL (ref 0.44–1.00)
GFR, Estimated: >60 mL/min (ref >60)
Glucose, Bld: 154 mg/dL — ABNORMAL HIGH (ref 70–99)
Potassium: 4.3 mmol/L (ref 3.5–5.1)
Sodium: 136 mmol/L (ref 135–145)

## 2021-08-04 LAB — GLUCOSE, CAPILLARY
Glucose-Capillary: 142 mg/dL — ABNORMAL HIGH (ref 70–99)
Glucose-Capillary: 126 mg/dL — ABNORMAL HIGH (ref 70–99)
Glucose-Capillary: 168 mg/dL — ABNORMAL HIGH (ref 70–99)

## 2021-08-04 LAB — CBC
HCT: 31.4 % — ABNORMAL LOW (ref 36.0–46.0)
Hemoglobin: 10.4 g/dL — ABNORMAL LOW (ref 12.0–15.0)
MCH: 34.9 pg — ABNORMAL HIGH (ref 26.0–34.0)
MCHC: 33.1 g/dL (ref 30.0–36.0)
MCV: 105.4 fL — ABNORMAL HIGH (ref 80.0–100.0)
Platelets: 240 10*3/uL (ref 150–400)
RBC: 2.98 MIL/uL — ABNORMAL LOW (ref 3.87–5.11)
RDW: 13.6 % (ref 11.5–15.5)
WBC: 6.5 10*3/uL (ref 4.0–10.5)
nRBC: 0 % (ref 0.0–0.2)

## 2021-08-04 MED ORDER — METOPROLOL TARTRATE 100 MG PO TABS
100.0000 mg | ORAL_TABLET | Freq: Two times a day (BID) | ORAL | Status: DC
  Administered 2021-08-04 – 2021-08-06 (×5): 100 mg via ORAL
  Filled 2021-08-04 (×5): qty 1

## 2021-08-04 MED ORDER — METOPROLOL TARTRATE 50 MG PO TABS
50.0000 mg | ORAL_TABLET | Freq: Once | ORAL | Status: AC
  Administered 2021-08-04: 50 mg via ORAL
  Filled 2021-08-04: qty 1

## 2021-08-04 NOTE — Consult Note (Signed)
?CONSULT NOTE ? ? ? ?[name]  WCB:[id number] DOB: 21-Feb-1933 DOA: 07/26/2021 ?PCP: [name], MD  ?Narrative: [name] is a 88/F w/ h/o afib on Eliquis; DM; HTN; HLD; and CVA (1999) who was admitted on 4/13 with critical limb ischemia of the left leg.  She underwent left common femoral endarterectomy .  Postop course complicated by A-fib RVR requiring amiodarone and Cardizem gtt.  ?-Also noted to be increasingly confused in the hospital, at baseline has intermittent confusion  ?-Seen by cardiology, cardioversion was planned, but canceled on account of confusion ?-Slowly improving ? ?Subjective: ?-no complaints. No pain. Denies chest pain or palpitations ? ?Assessment and Plan: ? ?Critical limb ischemia of left lower extremity ([facility]) ?-Management per vascular surgery ?-s/p femoral endarterectomy ?-Discharge planning, SNF recommended by physical therapy ? ?Vascular dementia with delirium ([facility]) ?-Patient with neuropsych testing in 02/2021 that showed "significant and severe deficits in all cognitive domains assessed...   The level of impairment noted will strongly indicate the patient will need daily care and observation and is really functional level that would make living independent even if she is regularly checked on a safety concern". ?-Hospital delirium improving  ?-Delirium precautions ?- hold opioids ? ?ATRIAL FIBRILLATION with RVR ?-Remains in A-fib, with rvr, hr 110 at time of my eval ?-Seen by cardiology earlier this admission transition to oral diltiazem and amiodarone, TEE cardioversion deferred for now, also remains on oral metoprolol ?-Cardiology signed off ?-yesterday cardizem increased to 360. Today I will increase metop tartrate from 50 to 100 bid. ?- cont amio ?-Continue apixaban for anticoagulation ? ?Essential hypertension ?Bp wnl ?-Continue above meds ? ?Type 2 diabetes mellitus with hyperlipidemia ([facility]) ?-CBGs improved w/ increase in semglee ?- cont basal/bolus  insulin ? ?Protein-calorie malnutrition, severe ?-Body mass index is 16.83 kg/m?Marland Kitchen.  ?-RD consult, add supplements ? ?DVT prophylaxis: Apixaban ?Code Status: Full code ?Disposition Plan: SNF per vascular surgery ? ? ?Objective: ?Vitals:  ? 08/03/21 2018 08/03/21 2300 08/04/21 0314 08/04/21 0835  ?BP: 139/83 (!) 159/89 99/74 131/84  ?Pulse: (!) 120 (!) 120 82 (!) 125  ?Resp: 20 20 19 14   ?Temp: 97.8 ?F (36.6 ?C) 98.1 ?F (36.7 ?C) (!) 97.3 ?F (36.3 ?C) 98 ?F (36.7 ?C)  ?TempSrc: Oral Oral Oral Oral  ?SpO2: 99% 98% 98% 98%  ?Weight:      ?Height:      ? ? ?Intake/Output Summary (Last 24 hours) at 08/04/2021 1045 ?Last data filed at 08/04/2021 08/06/2021 ?Gross per 24 hour  ?Intake 100 ml  ?Output 700 ml  ?Net -600 ml  ? ?Filed Weights  ? 07/26/21 0609 07/29/21 0400 07/31/21 0751  ?Weight: 38.1 kg 37.8 kg 37.8 kg  ? ? ?Examination: ? ?General exam: Pleasant elderly female sitting up in bed, awake alert oriented to self and partly to place only, moderate cognitive deficits ?CVS: S1-S2, irregularly irregular rhythm, tachy ?Lungs: Decreased breath sounds the bases otherwise clear ?Abdomen: Soft, nontender, bowel sounds present ?Extremities: No edema, left groin unremarkable  ?Skin: No rashes ?Psychiatry:  Mood & affect appropriate.,  Poor insight and judgment ? ? ? ?Data Reviewed:  ? ?CBC: ?Recent Labs  ?Lab 07/31/21 ?0246 08/01/21 ?0754 08/02/21 ?0248 08/03/21 ?0122 08/04/21 ?0122  ?WBC 9.9 7.2 7.2 6.5 6.5  ?NEUTROABS 7.5  --   --   --   --   ?HGB 11.5* 10.7* 11.0* 10.6* 10.4*  ?HCT 34.0* 30.9* 32.9* 30.9* 31.4*  ?MCV 104.3* 103.7* 104.1* 106.6* 105.4*  ?PLT 174 175 211 209  240  ? ?Basic Metabolic Panel: ?Recent Labs  ?Lab 07/30/21 ?0416 08/01/21 ?0754 08/02/21 ?0248 08/03/21 ?0122 08/04/21 ?0122  ?NA 138 134* 134* 137 136  ?K 3.9 3.9 4.9 5.1 4.3  ?CL 101 99 99 104 99  ?CO2 28 28 26 24 27   ?GLUCOSE 158* 158* 136* 323* 154*  ?BUN 22 30* 28* 39* 23  ?CREATININE 0.75 1.02* 0.94 1.10* 0.81  ?CALCIUM 9.3 8.9 9.2 9.4 9.2  ?MG 1.6*  --   1.8  --   --   ? ?GFR: ?Estimated Creatinine Clearance: 29.8 mL/min (by C-G formula based on SCr of 0.81 mg/dL). ?Liver Function Tests: ?No results for input(s): AST, ALT, ALKPHOS, BILITOT, PROT, ALBUMIN in the last 168 hours. ?No results for input(s): LIPASE, AMYLASE in the last 168 hours. ?No results for input(s): AMMONIA in the last 168 hours. ?Coagulation Profile: ?Recent Labs  ?Lab 07/31/21 ?08/02/21  ?INR 1.3*  ? ?Cardiac Enzymes: ?No results for input(s): CKTOTAL, CKMB, CKMBINDEX, TROPONINI in the last 168 hours. ?BNP (last 3 results) ?No results for input(s): PROBNP in the last 8760 hours. ?HbA1C: ?No results for input(s): HGBA1C in the last 72 hours. ?CBG: ?Recent Labs  ?Lab 08/03/21 ?0623 08/03/21 ?1204 08/03/21 ?1614 08/03/21 ?2132 08/04/21 ?08/06/21  ?GLUCAP 236* 199* 164* 195* 142*  ? ?Lipid Profile: ?No results for input(s): CHOL, HDL, LDLCALC, TRIG, CHOLHDL, LDLDIRECT in the last 72 hours. ?Thyroid Function Tests: ?No results for input(s): TSH, T4TOTAL, FREET4, T3FREE, THYROIDAB in the last 72 hours. ?Anemia Panel: ?No results for input(s): VITAMINB12, FOLATE, FERRITIN, TIBC, IRON, RETICCTPCT in the last 72 hours. ?Urine analysis: ?   ?Component Value Date/Time  ? COLORURINE COLORLESS (A) 07/28/2021 1742  ? APPEARANCEUR CLEAR 07/28/2021 1742  ? LABSPEC 1.003 (L) 07/28/2021 1742  ? PHURINE 9.0 (H) 07/28/2021 1742  ? GLUCOSEU NEGATIVE 07/28/2021 1742  ? HGBUR NEGATIVE 07/28/2021 1742  ? BILIRUBINUR NEGATIVE 07/28/2021 1742  ? KETONESUR NEGATIVE 07/28/2021 1742  ? PROTEINUR NEGATIVE 07/28/2021 1742  ? UROBILINOGEN 0.2 10/28/2007 0214  ? NITRITE NEGATIVE 07/28/2021 1742  ? LEUKOCYTESUR NEGATIVE 07/28/2021 1742  ? ?Sepsis Labs: ?@[email or web address] (procalcitonin:4,lacticidven:4) ? ?) ?No results found for this or any previous visit (from the past 240 hour(s)). ?  ? ?Radiology Studies: ?No results found. ? ? ?Scheduled Meds: ? amiodarone  200 mg Oral BID  ? apixaban  2.5 mg Oral BID  ? atorvastatin  40 mg Oral Daily  ?  diltiazem  360 mg Oral Daily  ? docusate sodium  100 mg Oral Daily  ? ezetimibe  10 mg Oral Daily  ? feeding supplement  237 mL Oral TID BM  ? insulin aspart  0-9 Units Subcutaneous TID WC  ? insulin glargine-yfgn  20 Units Subcutaneous QHS  ? latanoprost  1 drop Both Eyes Daily  ? metFORMIN  1,000 mg Oral BID WC  ? metoprolol tartrate  100 mg Oral BID  ? multivitamin with minerals  1 tablet Oral Daily  ? pantoprazole  40 mg Oral Daily  ? ?Continuous Infusions: ? magnesium sulfate bolus IVPB    ? ? ? LOS: 9 days  ? ? ?Time spent: 30 min ? ? ? ?07/30/2021, MD ?[facility] ? ? ?08/04/2021, 10:45 AM  ?  ?

## 2021-08-04 NOTE — TOC Progression Note (Signed)
Transition of Care (TOC) - Progression Note  ? ? ?Patient Details  ?Name: [name] ?MRN: [id number] ?Date of Birth: 1932/08/22 ? ?Transition of Care (TOC) CM/SW Contact  ?Patrice Paradise, LCSW ?Phone Number: [phone] ?08/04/2021, 3:43 PM ? ?Clinical Narrative:    ? ?CSW confirmed that pt authorization is back however Wendi at [facility] that they are unable to accept pt until Monday. ? ?TOC team will continue to assist with discharge planning needs.  ? ? ?Expected Discharge Plan: Skilled Nursing Facility ?Barriers to Discharge: Continued Medical Work up, English as a second language teacher, SNF Pending bed offer ? ?Expected Discharge Plan and Services ?Expected Discharge Plan: Skilled Nursing Facility ?  ?  ?Post Acute Care Choice: Skilled Nursing Facility ?Living arrangements for the past 2 months: Single Family Home ?                ?  ?  ?  ?  ?  ?  ?  ?  ?  ?  ? ? ?Social Determinants of Health (SDOH) Interventions ?  ? ?Readmission Risk Interventions ?   ? View : No data to display.  ?  ?  ?  ? ? ?

## 2021-08-04 NOTE — Progress Notes (Addendum)
?  Progress Note ? ? ? ?08/04/2021 ?11:43 AM ?4 Days Post-Op ? ?Subjective:  no complaints ? ? ?Vitals:  ? 08/04/21 0314 08/04/21 0835  ?BP: 99/74 131/84  ?Pulse: 82 (!) 125  ?Resp: 19 14  ?Temp: (!) 97.3 ?F (36.3 ?C) 98 ?F (36.7 ?C)  ?SpO2: 98% 98%  ? ?Physical Exam: ?Cardiac: irregular ?Lungs:  non labored ?Incisions:  left groin incision is c/d/I without swelling or hematoma ?Extremities:  well perfused and warm. Left PT/Pero doppler signals. Right Dp palpable ?Abdomen:  flat, soft ?Neurologic: alert and oriented ? ?CBC ?   ?Component Value Date/Time  ? WBC 6.5 08/04/2021 0122  ? RBC 2.98 (L) 08/04/2021 0122  ? HGB 10.4 (L) 08/04/2021 0122  ? HGB 13.9 12/04/2020 0824  ? HCT 31.4 (L) 08/04/2021 0122  ? HCT 42.4 12/04/2020 0824  ? PLT 240 08/04/2021 0122  ? PLT 224 12/04/2020 0824  ? MCV 105.4 (H) 08/04/2021 0122  ? MCV 101 (H) 12/04/2020 0824  ? MCH 34.9 (H) 08/04/2021 0122  ? MCHC 33.1 08/04/2021 0122  ? RDW 13.6 08/04/2021 0122  ? RDW 11.9 12/04/2020 0824  ? LYMPHSABS 0.9 07/31/2021 0246  ? LYMPHSABS 1.7 04/22/2019 1420  ? MONOABS 1.5 (H) 07/31/2021 0246  ? EOSABS 0.0 07/31/2021 0246  ? EOSABS 0.1 04/22/2019 1420  ? BASOSABS 0.0 07/31/2021 0246  ? BASOSABS 0.0 04/22/2019 1420  ? ? ?BMET ?   ?Component Value Date/Time  ? NA 136 08/04/2021 0122  ? NA 142 12/04/2020 0824  ? K 4.3 08/04/2021 0122  ? CL 99 08/04/2021 0122  ? CO2 27 08/04/2021 0122  ? GLUCOSE 154 (H) 08/04/2021 0122  ? BUN 23 08/04/2021 0122  ? BUN 21 12/04/2020 0824  ? CREATININE 0.81 08/04/2021 0122  ? CREATININE 0.79 03/21/2016 1116  ? CALCIUM 9.2 08/04/2021 0122  ? GFRNONAA >60 08/04/2021 0122  ? GFRAA >60 06/26/2019 0457  ? ? ?INR ?   ?Component Value Date/Time  ? INR 1.3 (H) 07/31/2021 [id number]  ? INR 4.3 06/06/2010 1010  ? ? ? ?Intake/Output Summary (Last 24 hours) at 08/04/2021 1143 ?Last data filed at 08/04/2021 [id number] ?Gross per 24 hour  ?Intake 100 ml  ?Output 700 ml  ?Net -600 ml  ? ? ? ?Assessment/Plan:  88 y.o. female is s/p L CFA endarterectomy 9  Days post op  ? ?Left groin incision healing well. LLE well perfused and warm ?Minimal pain ?Afib- remains asymptomatic. Continue current medical management ?Continue to mobilize as tolerated ?Appreciate TRH assistance in medical management ?Pending SNF authorization ? ?DVT prophylaxis:  sq hep ? ? ?Karoline Caldwell, PA-C ?[facility] ?[phone] ?08/04/2021 ?11:43 AM ? ?VASCULAR STAFF ADDENDUM: ?I have independently interviewed and examined the patient. ?I agree with the above.  ?Pending disposition to SNF. ?Left groin incision healing well. ?Brisk Doppler flow in the left posterior tibial artery. ? ?Yevonne Aline. Stanford Breed, MD ?[facility] [place] ?Office Phone Number: [phone] ?08/04/2021 12:49 PM ? ? ?

## 2021-08-05 DIAGNOSIS — I480 Paroxysmal atrial fibrillation: Secondary | ICD-10-CM | POA: Diagnosis not present

## 2021-08-05 DIAGNOSIS — I70222 Atherosclerosis of native arteries of extremities with rest pain, left leg: Secondary | ICD-10-CM | POA: Diagnosis not present

## 2021-08-05 LAB — GLUCOSE, CAPILLARY
Glucose-Capillary: 217 mg/dL — ABNORMAL HIGH (ref 70–99)
Glucose-Capillary: 125 mg/dL — ABNORMAL HIGH (ref 70–99)
Glucose-Capillary: 281 mg/dL — ABNORMAL HIGH (ref 70–99)
Glucose-Capillary: 173 mg/dL — ABNORMAL HIGH (ref 70–99)

## 2021-08-05 NOTE — Progress Notes (Addendum)
? ?Progress Note ? ? ? ?[name]  [id number] DOB: [birth year] DOA: 07/26/2021 ?PCP: Lucianne Lei, MD  ?Narrative: [name] is a 88/F w/ h/o afib on Eliquis; DM; HTN; HLD; and CVA (1999) who was admitted on 4/13 with critical limb ischemia of the left leg.  She underwent left common femoral endarterectomy .  Postop course complicated by A-fib RVR requiring amiodarone and Cardizem gtt.  ?-Also noted to be increasingly confused in the hospital, at baseline has intermittent confusion  ?-Seen by cardiology, cardioversion was planned, but canceled on account of confusion ?-Slowly improving ? ?Subjective: ?-No significant events overnight as discussed with staff, she denies any complaints.   ? ?Assessment and Plan: ? ?Critical limb ischemia of left lower extremity ([place]) ?-Management per vascular surgery ?-s/p femoral endarterectomy ?-Discharge planning, SNF recommended by physical therapy ? ?Vascular dementia with delirium ([place]) ?-Patient with neuropsych testing in 02/2021 that showed "significant and severe deficits in all cognitive domains assessed...   The level of impairment noted will strongly indicate the patient will need daily care and observation and is really functional level that would make living independent even if she is regularly checked on a safety concern". ?-Hospital delirium has resolved ?-Delirium precautions ?- hold opioids ? ?ATRIAL FIBRILLATION with RVR>> A-flutter, heart rate uncontrolled with 221 AV block ?-Seen by cardiology earlier this admission transition to oral diltiazem and amiodarone, TEE cardioversion deferred for now, also remains on oral metoprolol ?-Cardiology signed off ?-Heart rate seems to be elevated, she is on max dose Cardizem CD at 360 mg daily, and her metoprolol tartrate has been increased yesterday from 50 to 100 mg oral twice daily, continue to monitor on these doses.   ?- cont amio ?-Continue apixaban for anticoagulation ? ? ?Essential hypertension ?Bp  wnl ?-Continue above meds ? ?Type 2 diabetes mellitus with hyperlipidemia ([place]) ?-CBGs improved w/ increase in semglee ?- cont basal/bolus insulin ? ?Protein-calorie malnutrition, severe ?-Body mass index is 16.83 kg/m?Marland Kitchen.  ?-RD consult, add supplements ? ?DVT prophylaxis: Apixaban ?Code Status: Full code ?Disposition Plan: SNF per vascular surgery, ? ? ?Objective: ?Vitals:  ? 08/04/21 1942 08/04/21 2334 08/05/21 0310 08/05/21 0829  ?BP: 130/83 (!) 119/50 (!) 144/77 133/77  ?Pulse: (!) 109 (!) 58 99 (!) 121  ?Resp: 20 17 20 18   ?Temp: 97.6 ?F (36.4 ?C) (!) 97.3 ?F (36.3 ?C) 97.9 ?F (36.6 ?C) 98.2 ?F (36.8 ?C)  ?TempSrc: Oral Oral Oral Oral  ?SpO2: 96% 95% 99% 99%  ?Weight:      ?Height:      ? ? ?Intake/Output Summary (Last 24 hours) at 08/05/2021 0956 ?Last data filed at 08/05/2021 0831 ?Gross per 24 hour  ?Intake 480 ml  ?Output 1250 ml  ?Net -770 ml  ? ? ?Filed Weights  ? 07/26/21 0609 07/29/21 0400 07/31/21 0751  ?Weight: 38.1 kg 37.8 kg 37.8 kg  ? ? ?Examination: ? ?General exam: ?Pleasant female, sitting in bed, no apparent distress, awake, alert oriented to self and place, not to date, moderate cognitive deficits ?Symmetrical Chest wall movement, Good air movement bilaterally, CTAB ?Tachycardic,No Gallops,Rubs or new Murmurs, No Parasternal Heave ?+ve B.Sounds, Abd Soft, No tenderness, No rebound - guarding or rigidity. ?No Cyanosis, Clubbing or edema, No new Rash or bruise   ? ? ? ?Data Reviewed:  ? ?CBC: ?Recent Labs  ?Lab 07/31/21 ?0246 08/01/21 ?[id number] 08/02/21 ?[id number] 08/03/21 ?0122 08/04/21 ?0122  ?WBC 9.9 7.2 7.2 6.5 6.5  ?NEUTROABS 7.5  --   --   --   --   ?  HGB 11.5* 10.7* 11.0* 10.6* 10.4*  ?HCT 34.0* 30.9* 32.9* 30.9* 31.4*  ?MCV 104.3* 103.7* 104.1* 106.6* 105.4*  ?PLT 174 175 211 209 240  ? ? ?Basic Metabolic Panel: ?Recent Labs  ?Lab 07/30/21 ?0416 08/01/21 ?[id number] 08/02/21 ?0248 08/03/21 ?0122 08/04/21 ?0122  ?NA 138 134* 134* 137 136  ?K 3.9 3.9 4.9 5.1 4.3  ?CL 101 99 99 104 99  ?CO2 28 28 26 24 27    ?GLUCOSE 158* 158* 136* 323* 154*  ?BUN 22 30* 28* 39* 23  ?CREATININE 0.75 1.02* 0.94 1.10* 0.81  ?CALCIUM 9.3 8.9 9.2 9.4 9.2  ?MG 1.6*  --  1.8  --   --   ? ? ?GFR: ?Estimated Creatinine Clearance: 29.8 mL/min (by C-G formula based on SCr of 0.81 mg/dL). ?Liver Function Tests: ?No results for input(s): AST, ALT, ALKPHOS, BILITOT, PROT, ALBUMIN in the last 168 hours. ?No results for input(s): LIPASE, AMYLASE in the last 168 hours. ?No results for input(s): AMMONIA in the last 168 hours. ?Coagulation Profile: ?Recent Labs  ?Lab 07/31/21 ?[id number]  ?INR 1.3*  ? ? ?Cardiac Enzymes: ?No results for input(s): CKTOTAL, CKMB, CKMBINDEX, TROPONINI in the last 168 hours. ?BNP (last 3 results) ?No results for input(s): PROBNP in the last 8760 hours. ?HbA1C: ?No results for input(s): HGBA1C in the last 72 hours. ?CBG: ?Recent Labs  ?Lab 08/03/21 ?2132 08/04/21 ?[id number] 08/04/21 ?1219 08/04/21 ?2126 08/05/21 ?[id number]  ?GLUCAP 195* 142* 126* 168* 217*  ? ? ?Lipid Profile: ?No results for input(s): CHOL, HDL, LDLCALC, TRIG, CHOLHDL, LDLDIRECT in the last 72 hours. ?Thyroid Function Tests: ?No results for input(s): TSH, T4TOTAL, FREET4, T3FREE, THYROIDAB in the last 72 hours. ?Anemia Panel: ?No results for input(s): VITAMINB12, FOLATE, FERRITIN, TIBC, IRON, RETICCTPCT in the last 72 hours. ?Urine analysis: ?   ?Component Value Date/Time  ? COLORURINE COLORLESS (A) 07/28/2021 1742  ? APPEARANCEUR CLEAR 07/28/2021 1742  ? LABSPEC 1.003 (L) 07/28/2021 1742  ? PHURINE 9.0 (H) 07/28/2021 1742  ? GLUCOSEU NEGATIVE 07/28/2021 1742  ? HGBUR NEGATIVE 07/28/2021 1742  ? [place] NEGATIVE 07/28/2021 1742  ? [place] NEGATIVE 07/28/2021 1742  ? PROTEINUR NEGATIVE 07/28/2021 1742  ? UROBILINOGEN 0.2 10/28/2007 0214  ? NITRITE NEGATIVE 07/28/2021 1742  ? LEUKOCYTESUR NEGATIVE 07/28/2021 1742  ? ?Sepsis Labs: ?@LABRCNTIP (procalcitonin:4,lacticidven:4) ? ?) ?No results found for this or any previous visit (from the past 240 hour(s)). ?  ? ?Radiology  Studies: ?No results found. ? ? ?Scheduled Meds: ? amiodarone  200 mg Oral BID  ? apixaban  2.5 mg Oral BID  ? atorvastatin  40 mg Oral Daily  ? diltiazem  360 mg Oral Daily  ? docusate sodium  100 mg Oral Daily  ? ezetimibe  10 mg Oral Daily  ? feeding supplement  237 mL Oral TID BM  ? insulin aspart  0-9 Units Subcutaneous TID WC  ? insulin glargine-yfgn  20 Units Subcutaneous QHS  ? latanoprost  1 drop Both Eyes Daily  ? metFORMIN  1,000 mg Oral BID WC  ? metoprolol tartrate  100 mg Oral BID  ? multivitamin with minerals  1 tablet Oral Daily  ? pantoprazole  40 mg Oral Daily  ? ?Continuous Infusions: ? magnesium sulfate bolus IVPB    ? ? ? LOS: 10 days  ? ? ?Time spent: 30 min ? ? ? ?Laurey Arrow, MD ?[facility] ? ? ?08/05/2021, 9:56 AM  ?  ?

## 2021-08-05 NOTE — Discharge Summary (Signed)
?Bypass Discharge Summary ?Patient ID: ?[name] ?[id number] ?88 y.o. ?[birth year] ? ?Admit date: 07/26/2021 ? ?Discharge date and time: 08/08/2021 ? ?Admitting Physician: Broadus John, MD  ? ?Discharge Physician: Broadus John, MD ? ?Admission Diagnoses: Critical limb ischemia of left lower extremity ([place]) [I70.222] ?PAD (peripheral artery disease) ([place]) [I73.9] ? ?Discharge Diagnoses: PAD; Atrial fibrillation with RVR, essential HTN, Type II Diabetes Mellitus, Protein Calorie malnutrition ? ?Admission Condition: fair ? ?Discharged Condition: fair ? ?Indication for Admission:  [name] is a 88 y.o. female [place] 4 critical limb ischemia in the left leg.  Most recent angiogram demonstrated near-occlusion of the left common femoral artery, with profunda only runoff, and reconstitution of the posterior tibial artery distally.  This vessel was very small.  Being that [name] has rest pain, no tissue loss, we discussed left-sided common femoral endarterectomy.  I think this should relieve her rest pain.  After discussing the risk and benefits, [name] elected to proceed. ?  ? ?Hospital Course: [name] was admitted on 07/26/21 and underwent a very straightforward left common femoral endarterectomy by Dr. Virl Cagey. She tolerated the procedure well and was transferred to the recovery room instable condition. Later on same day of surgery patient was transferred to floor and there was some concerns per patients son that patients face was " swollen". RN noted some slight facial droop and left sided grip weakness. Rapid response and stroke RN was called. CT head, CTA and CTP were obtained. Neurology was consulted. MRI brain without contrast was recommended with low suspicion for stroke. ? ?By POD#1, Neuro remained intact and felt to be back to baseline. MRI was negative. Neurology signed off with recommendation for outpatient Dementia follow up. Her left groin incision intact and well appearing. Left leg  well perfused and warm with doppler PT signals.  PT/ OT evaluated recommended SNF. She was somewhat lethargic.  ? ? ?POD#2 Left groin incision clean, dry and intact without swelling or hematoma. Some delirium expected and more lethargic. Home Eliquis restarted for atrial fibrillation. Continued to have very irregular rates in the 150s so EKG was obtained showing SVT with possible septal infarct. Cardiology was consulted. Cardiology determined she was in atrial fibrillation with RVR.  Recommendation was to continue IV metoprolol and start IV diltiazem. ? ?POD#3 left groin intact. Very tender. No concern for hematoma or infection. Left leg with good brisk PT signal. Encouraged mobilization. Remained in atrial fibrillation with RVR. Echo obtained. Continued on Eliquis. Some hyperglycemia. Restarted home metformin dose at half strength 500 mg BID.  Continued to have HR > 160s. Patient remained asymptomatic despite tachycardia. Now with some hypotension. Cardiology recommended starting amiodarone. TOC assisting in SNF placement.  ? ?POD#4, continued adequate perfusion of left lower extremity. Left groin incision intact and well appearing. Remained hyperglycemic. Increased Metformin to home dose of 1000 mg BID. Continued atrial fibrillation with RVR. Cardiology recommendation to continue oral lopressor 50 mg BID, IV diltiazem, Eliquis, Amiodarone IV, and plan for TEE and cardioversion POD#5. Tolerating mobility with mobility specialist. RD consulted for assistance with nutritional management as poor po intact. Encouraged Ensure supplements BID ? ?POD#5, plan for TEE and cardioversion however patient presented very somnolent with AMS. Felt that her rates were better controlled. Discussed with patients son and felt it was best to not proceed with cardioversion. Patients son did not want to proceed with TEE due to patients AMS as well as potential risks. Cardiology therefore made recommendations to continue home lopressor  dose,  discontinue IV diltiazem and start Cardizem oral 60 mg q 6 hrs, and discontinue IV Amiodarone and change to oral Amiodarone 200 mg BID. They made arrangements for outpatient follow up with possible cardioversion once medically stable in several weeks.Due to patients slow clinical improvement post operatively we Consulted Hospitalist to assist with her medical management.  ? ?POD#6, patient more alert and able to participate appropriately. Left leg with continued adequate perfusion. Hospitalists continued to assist with medical management. Cardiology signed off with recommendations to continue PO Amiodarone 200 mg BID, Apixaban 2.5 mg BID, Atorvastatin 40 mg, Cardiazem 240 mg daily, Zetia 10 mg daily, lopressor 50 mg BID. Discontinued Lisinopril due to continued lower blood pressures. Outpatient cardiology follow up arranged ? ?The remainder of her hospital course consisted of continued mobilization, heart rate control, and increased oral nutrition intake ? ?POD#12 Cardiology determined that due to her poor rate control with atrial fibrillation that it was better that she have TEE and DCCV prior to discharge. She underwent successful cardioversion with return to normal sinus rhythm. She remained stable post Cardioversion. She will discharge on Amiodarone 200 mg po BID x 14 days and then 200 mg daily, metoprolol 50 mg BID, Cardizem 360 mg daily and Apixaban 2.5 mg BID. Otherwise hemodynamically stable. Lower extremities well perfused and warm. ? ?By POD#13 she remained stable for discharge to SNF. Remains in NSR. Left groin healing well. Left leg well perfused and warm with doppler PT signal. Right leg warm with palpable DP pulse. Authorization has been obtained to discharge to [facility]. She will resume home medications as prescribed. The addition of new prescriptions and changes are listed below in medication list. She has outpatient follow up arranged with cardiology and the atrial fibrillation clinic.  She will also have follow up at [facility] in 1 month with ABIs ? ?Consults: cardiology and Hospitalist ? ?Treatments: analgesia: acetaminophen and Tramadol, cardiac meds: amiodarone, Diltiazem, metoprolol anticoagulation: Eliquis, therapies: PT and OT, and surgery: Left common femoral endarterectomy 07/26/21 ? ? ?Disposition:  ?Discharge disposition: 03-Skilled Nursing Facility ? ? ? ? ? ? ?- For [facility] use --- ? ?Post-op:  ?Wound infection: No  ?Graft infection: No  ?Transfusion: No  If yes, 0 units given ?New Arrhythmia: Yes ?Patency judged by: Valu.Nieves ] Dopper only, [ ]  Palpable graft pulse, [ ]  Palpable distal pulse, [ ]  ABI inc. > 0.15, [ ]  Duplex ?D/C Ambulatory Status: Ambulatory with Assistance ? ?Complications: ?MI: Valu.Nieves ] No, [ ]  Troponin only, [ ]  EKG or Clinical ?CHF: No ?Resp failure: [ X] none, [ ]  Pneumonia, [ ]  Ventilator ?Chg in renal function: Valu.Nieves ] none, [ ]  Inc. Cr > 0.5, [ ]  Temp. Dialysis, [ ]  Permanent dialysis ?Stroke: Valu.Nieves ] None, [ ]  Minor, [ ]  Major ?Return to OR: No  ?Reason for return to OR: [ ]  Bleeding, [ ]  Infection, [ ]  Thrombosis, [ ]  Revision ? ?Discharge medications: ?Statin use:  Yes ?ASA use:  No  for medical reason on Apixaban ?Plavix use:  No  for medical reason on Apixaban ?Beta blocker use: Yes ?Coumadin use: No  for medical reason on Apixaban ? ? ? ?Patient Instructions:  ?Allergies as of 08/08/2021   ?No Known Allergies ?  ? ?  ?Medication List  ?  ? ?STOP taking these medications   ? ?Ginkgo Biloba 60 MG Caps ?  ?glipiZIDE 10 MG tablet ?Commonly known as: GLUCOTROL ?  ?Gvoke HypoPen 2-Pack 0.5 MG/0.1ML Soaj ?Generic drug: Glucagon ?  ?  traMADol 50 MG tablet ?Commonly known as: ULTRAM ?  ? ?  ? ?TAKE these medications   ? ?Accu-Chek Softclix Lancets lancets ?  ?acetaminophen 650 MG CR tablet ?Commonly known as: TYLENOL ?Take 1,300 mg by mouth every 8 (eight) hours as needed for pain. ?  ?amiodarone 200 MG tablet ?Commonly known as: PACERONE ?Take 1 tablet (200 mg total) by mouth 2  (two) times daily for 10 days, THEN 1 tablet (200 mg total) daily. ?Start taking on: August 08, 2021 ?  ?atorvastatin 40 MG tablet ?Commonly known as: LIPITOR ?Take 1 tablet (40 mg total) by mouth daily. ?

## 2021-08-05 NOTE — Progress Notes (Addendum)
?  Progress Note ? ? ? ?08/05/2021 ?8:27 AM ?5 Days Post-Op ? ?Subjective:  no complaints. States she is doing "fair" ? ? ?Vitals:  ? 08/04/21 2334 08/05/21 0310  ?BP: (!) 119/50 (!) 144/77  ?Pulse: (!) 58 99  ?Resp: 17 20  ?Temp: (!) 97.3 ?F (36.3 ?C) 97.9 ?F (36.6 ?C)  ?SpO2: 95% 99%  ? ?Physical Exam: ?Cardiac: irregular ?Lungs:  non labored ?Incisions:  left groin incision is c/d/I without swelling or hematoma ?Extremities:  well perfused and warm. Doppler left PT. Palpable right dp ?Abdomen:  flat, soft ?Neurologic: alert and oriented ? ?CBC ?   ?Component Value Date/Time  ? WBC 6.5 08/04/2021 0122  ? RBC 2.98 (L) 08/04/2021 0122  ? HGB 10.4 (L) 08/04/2021 0122  ? HGB 13.9 12/04/2020 0824  ? HCT 31.4 (L) 08/04/2021 0122  ? HCT 42.4 12/04/2020 0824  ? PLT 240 08/04/2021 0122  ? PLT 224 12/04/2020 0824  ? MCV 105.4 (H) 08/04/2021 0122  ? MCV 101 (H) 12/04/2020 0824  ? MCH 34.9 (H) 08/04/2021 0122  ? MCHC 33.1 08/04/2021 0122  ? RDW 13.6 08/04/2021 0122  ? RDW 11.9 12/04/2020 0824  ? LYMPHSABS 0.9 07/31/2021 0246  ? LYMPHSABS 1.7 04/22/2019 1420  ? MONOABS 1.5 (H) 07/31/2021 0246  ? EOSABS 0.0 07/31/2021 0246  ? EOSABS 0.1 04/22/2019 1420  ? BASOSABS 0.0 07/31/2021 0246  ? BASOSABS 0.0 04/22/2019 1420  ? ? ?BMET ?   ?Component Value Date/Time  ? NA 136 08/04/2021 0122  ? NA 142 12/04/2020 0824  ? K 4.3 08/04/2021 0122  ? CL 99 08/04/2021 0122  ? CO2 27 08/04/2021 0122  ? GLUCOSE 154 (H) 08/04/2021 0122  ? BUN 23 08/04/2021 0122  ? BUN 21 12/04/2020 0824  ? CREATININE 0.81 08/04/2021 0122  ? CREATININE 0.79 03/21/2016 1116  ? CALCIUM 9.2 08/04/2021 0122  ? GFRNONAA >60 08/04/2021 0122  ? GFRAA >60 06/26/2019 0457  ? ? ?INR ?   ?Component Value Date/Time  ? INR 1.3 (H) 07/31/2021 [id number]  ? INR 4.3 06/06/2010 1010  ? ? ? ?Intake/Output Summary (Last 24 hours) at 08/05/2021 0827 ?Last data filed at 08/05/2021 [id number] ?Gross per 24 hour  ?Intake 240 ml  ?Output 800 ml  ?Net -560 ml  ? ? ? ?Assessment/Plan:  88 y.o. female is  s/p  L CFA endarterectomy  10 Days post op  ? ?Left groin incision healing well. LLE well perfused and warm with PT signal ?Minimal pain ?Afib- remains asymptomatic. Continue current medical management ?Continue to mobilize  ?Appreciate TRH assistance in medical management ?SNF authorization approved, Plan for discharge to [facility] tomorrow 4/24 ? ?DVT prophylaxis:  Apixaban ? ? ?Karoline Caldwell, PA-C ?[facility] ?[id number] ?08/05/2021 ?8:27 AM ? ?VASCULAR STAFF ADDENDUM: ?I have independently interviewed and examined the patient. ?I agree with the above.  ? ?Yevonne Aline. Stanford Breed, MD ?[facility] [place] ?Office Phone Number: [phone] ?08/05/2021 11:00 AM ? ? ?

## 2021-08-05 NOTE — Discharge Instructions (Signed)
 [facility]  Discharge instructions  Lower Extremity Surgery  Please refer to the following instruction for your post-procedure care. Your surgeon or physician assistant will discuss any changes with you.  Activity  You are encouraged to walk as much as you can. You can slowly return to normal activities during the month after your surgery. Avoid strenuous activity and heavy lifting until your doctor tells you it's OK. Avoid activities such as vacuuming or swinging a golf club. Do not drive until your doctor give the OK and you are no longer taking prescription pain medications. It is also normal to have difficulty with sleep habits, eating and bowel movement after surgery. These will go away with time.  Bathing/Showering  Shower daily after you go home. Do not soak in a bathtub, hot tub, or swim until the incision heals completely.  Incision Care  Clean your incision with mild soap and water. Shower every day. Pat the area dry with a clean towel. You do not need a bandage unless otherwise instructed. Do not apply any ointments or creams to your incision. If you have open wounds you will be instructed how to care for them or a visiting nurse may be arranged for you. If you have staples or sutures along your incision they will be removed at your post-op appointment. You may have skin glue on your incision. Do not peel it off. It will come off on its own in about one week.  Wash the groin wound with soap and water daily and pat dry. (No tub bath-only shower)  Then put a dry gauze or washcloth in the groin to keep this area dry to help prevent wound infection.  Do this daily and as needed.  Do not use Vaseline or neosporin on your incisions.  Only use soap and water on your incisions and then protect and keep dry.  Diet  Resume your normal diet. There are no special food restrictions following this procedure. A low fat/ low cholesterol diet is recommended for  all patients with vascular disease. In order to heal from your surgery, it is CRITICAL to get adequate nutrition. Your body requires vitamins, minerals, and protein. Vegetables are the best source of vitamins and minerals. Vegetables also provide the perfect balance of protein. Processed food has little nutritional value, so try to avoid this.  Medications  Resume taking all your medications unless your doctor or physician assistant tells you not to. If your incision is causing pain, you may take over-the-counter pain relievers such as acetaminophen (Tylenol). If you were prescribed a stronger pain medication, please aware these medication can cause nausea and constipation. Prevent nausea by taking the medication with a snack or meal. Avoid constipation by drinking plenty of fluids and eating foods with high amount of fiber, such as fruits, vegetables, and grains. Take Colace 100 mg (an over-the-counter stool softener) twice a day as needed for constipation.  Do not take Tylenol if you are taking prescription pain medications.  Follow Up  Our office will schedule a follow up appointment 2-3 weeks following discharge.  Please call us immediately for any of the following conditions  Severe or worsening pain in your legs or feet while at rest or while walking Increase pain, redness, warmth, or drainage (pus) from your incision site(s) Fever of 101 degree or higher The swelling in your leg with the bypass suddenly worsens and becomes more painful than when you were in the hospital If you have been   instructed to feel your graft pulse then you should do so every day. If you can no longer feel this pulse, call the office immediately. Not all patients are given this instruction.  Leg swelling is common after leg bypass surgery.  The swelling should improve over a few months following surgery. To improve the swelling, you may elevate your legs above the level of your heart while you are sitting or  resting. Your surgeon or physician assistant may ask you to apply an ACE wrap or wear compression (TED) stockings to help to reduce swelling.  Reduce your risk of vascular disease  Stop smoking. If you would like help call [facility] at 1-800-QUIT-NOW (1-800-784-8669) or [facility] at [phone].  Manage your cholesterol Maintain a desired weight Control your diabetes weight Control your diabetes Keep your blood pressure down  If you have any questions, please call the office at [phone] 

## 2021-08-06 DIAGNOSIS — I480 Paroxysmal atrial fibrillation: Secondary | ICD-10-CM | POA: Diagnosis not present

## 2021-08-06 DIAGNOSIS — I70222 Atherosclerosis of native arteries of extremities with rest pain, left leg: Secondary | ICD-10-CM | POA: Diagnosis not present

## 2021-08-06 LAB — GLUCOSE, CAPILLARY
Glucose-Capillary: 143 mg/dL — ABNORMAL HIGH (ref 70–99)
Glucose-Capillary: 96 mg/dL (ref 70–99)
Glucose-Capillary: 102 mg/dL — ABNORMAL HIGH (ref 70–99)
Glucose-Capillary: 166 mg/dL — ABNORMAL HIGH (ref 70–99)

## 2021-08-06 LAB — SURGICAL PCR SCREEN
MRSA, PCR: NEGATIVE
Staphylococcus aureus: NEGATIVE

## 2021-08-06 MED ORDER — GLUCERNA SHAKE PO LIQD
237.0000 mL | Freq: Three times a day (TID) | ORAL | Status: DC
Start: 2021-08-06 — End: 2021-08-06

## 2021-08-06 MED ORDER — SODIUM CHLORIDE 0.9 % IV SOLN: INTRAVENOUS | Status: DC

## 2021-08-06 MED ORDER — MUPIROCIN 2 % EX OINT
1.0000 "application " | TOPICAL_OINTMENT | Freq: Two times a day (BID) | CUTANEOUS | Status: DC
  Administered 2021-08-06 – 2021-08-07 (×2): 1 via NASAL
  Filled 2021-08-06: qty 22

## 2021-08-06 NOTE — Progress Notes (Signed)
? ?Progress Note ? ? ? ?[name]  ZJQ:[id number] DOB: [birth year] DOA: 07/26/2021 ?PCP: [name], MD  ?Narrative: [name] is a 88/F w/ h/o afib on Eliquis; DM; HTN; HLD; and CVA (1999) who was admitted on 4/13 with critical limb ischemia of the left leg.  She underwent left common femoral endarterectomy .  Postop course complicated by A-fib RVR requiring amiodarone and Cardizem gtt.  ?-Also noted to be increasingly confused in the hospital, at baseline has intermittent confusion  ?-Seen by cardiology, cardioversion was planned, but canceled on account of confusion ?-Slowly improving ? ?Subjective: ?-No significant events overnight as discussed with staff, she denies any complaints.   ? ?Assessment and Plan: ? ?Critical limb ischemia of left lower extremity (HCC) ?-Management per vascular surgery ?-s/p femoral endarterectomy ?-Discharge planning, SNF recommended by physical therapy ? ?Vascular dementia with delirium (HCC) ?-Patient with neuropsych testing in 02/2021 that showed "significant and severe deficits in all cognitive domains assessed...   The level of impairment noted will strongly indicate the patient will need daily care and observation and is really functional level that would make living independent even if she is regularly checked on a safety concern". ?-Hospital delirium has resolved ?-Delirium precautions ?- hold opioids ? ?ATRIAL FIBRILLATION with RVR>> A-flutter, heart rate uncontrolled with 221 AV block ?-Seen by cardiology earlier this admission transition to oral diltiazem and amiodarone, TEE cardioversion deferred for now, also remains on oral metoprolol ?-Cardiology signed off ?-Heart rate seems to be elevated, she is on max dose Cardizem CD at 360 mg daily, and her metoprolol tartrate has been increased yesterday from 50 to 100 mg oral twice daily, continue to monitor on these doses.   ?- cont amio ?-Continue apixaban for anticoagulation ?-Heart rate remains persistently  elevated in the 120s, EKG confirms a flutter with 2-1 AV block despite being on max dose Cardizem CD, metoprolol and amiodarone, discussed with cardiology, will need cardioversion prior to discharge to facility, earliest appointment for TEE/ DCCV as this coming with stay and 1130. ? ?Essential hypertension ?Bp wnl ?-Continue above meds ? ?Type 2 diabetes mellitus with hyperlipidemia (HCC) ?-CBGs improved w/ increase in semglee ?- cont basal/bolus insulin ? ?Protein-calorie malnutrition, severe ?-Body mass index is 16.83 kg/m?Marland Kitchen.  ?-RD consult, add supplements ? ?DVT prophylaxis: Apixaban ?Code Status: Full code ?Disposition Plan: SNF per vascular surgery, given she is in uncontrolled a flutter, she will need cardioversion prior to discharge, it will be done on Wednesday, then she can be discharged after that. ? ? ?Objective: ?Vitals:  ? 08/05/21 2333 08/06/21 0440 08/06/21 0805 08/06/21 1148  ?BP: 122/79 115/79 139/85 (!) 147/92  ?Pulse: (!) 117 97 100 (!) 120  ?Resp: 20 17 15 20   ?Temp: 97.8 ?F (36.6 ?C) (!) 97.3 ?F (36.3 ?C) 99 ?F (37.2 ?C) 97.6 ?F (36.4 ?C)  ?TempSrc: Oral Axillary Oral Oral  ?SpO2: 94% 95% 99% 98%  ?Weight:      ?Height:      ? ? ?Intake/Output Summary (Last 24 hours) at 08/06/2021 1254 ?Last data filed at 08/06/2021 1150 ?Gross per 24 hour  ?Intake 840 ml  ?Output 600 ml  ?Net 240 ml  ? ? ?Filed Weights  ? 07/26/21 0609 07/29/21 0400 07/31/21 0751  ?Weight: 38.1 kg 37.8 kg 37.8 kg  ? ? ?Examination: ? ?General exam: ?Pleasant female, sitting in bed, no apparent distress, awake, alert oriented to self and place, not to date, moderate cognitive deficits ?Symmetrical Chest wall movement, Good air movement bilaterally,  CTAB ?Tachycardic,No Gallops,Rubs or new Murmurs, No Parasternal Heave ?+ve B.Sounds, Abd Soft, No tenderness, No rebound - guarding or rigidity. ?No Cyanosis, Clubbing or edema, No new Rash or bruise   ? ? ? ?Data Reviewed:  ? ?CBC: ?Recent Labs  ?Lab 07/31/21 ?0246 08/01/21 ?0754  08/02/21 ?0248 08/03/21 ?0122 08/04/21 ?0122  ?WBC 9.9 7.2 7.2 6.5 6.5  ?NEUTROABS 7.5  --   --   --   --   ?HGB 11.5* 10.7* 11.0* 10.6* 10.4*  ?HCT 34.0* 30.9* 32.9* 30.9* 31.4*  ?MCV 104.3* 103.7* 104.1* 106.6* 105.4*  ?PLT 174 175 211 209 240  ? ? ?Basic Metabolic Panel: ?Recent Labs  ?Lab 08/01/21 ?0754 08/02/21 ?0248 08/03/21 ?0122 08/04/21 ?0122  ?NA 134* 134* 137 136  ?K 3.9 4.9 5.1 4.3  ?CL 99 99 104 99  ?CO2 28 26 24 27   ?GLUCOSE 158* 136* 323* 154*  ?BUN 30* 28* 39* 23  ?CREATININE 1.02* 0.94 1.10* 0.81  ?CALCIUM 8.9 9.2 9.4 9.2  ?MG  --  1.8  --   --   ? ? ?GFR: ?Estimated Creatinine Clearance: 29.8 mL/min (by C-G formula based on SCr of 0.81 mg/dL). ?Liver Function Tests: ?No results for input(s): AST, ALT, ALKPHOS, BILITOT, PROT, ALBUMIN in the last 168 hours. ?No results for input(s): LIPASE, AMYLASE in the last 168 hours. ?No results for input(s): AMMONIA in the last 168 hours. ?Coagulation Profile: ?Recent Labs  ?Lab 07/31/21 ?9935  ?INR 1.3*  ? ? ?Cardiac Enzymes: ?No results for input(s): CKTOTAL, CKMB, CKMBINDEX, TROPONINI in the last 168 hours. ?BNP (last 3 results) ?No results for input(s): PROBNP in the last 8760 hours. ?HbA1C: ?No results for input(s): HGBA1C in the last 72 hours. ?CBG: ?Recent Labs  ?Lab 08/05/21 ?1111 08/05/21 ?1737 08/05/21 ?2107 08/06/21 ?7017 08/06/21 ?1152  ?GLUCAP 125* 281* 173* 143* 96  ? ? ?Lipid Profile: ?No results for input(s): CHOL, HDL, LDLCALC, TRIG, CHOLHDL, LDLDIRECT in the last 72 hours. ?Thyroid Function Tests: ?No results for input(s): TSH, T4TOTAL, FREET4, T3FREE, THYROIDAB in the last 72 hours. ?Anemia Panel: ?No results for input(s): VITAMINB12, FOLATE, FERRITIN, TIBC, IRON, RETICCTPCT in the last 72 hours. ?Urine analysis: ?   ?Component Value Date/Time  ? COLORURINE COLORLESS (A) 07/28/2021 1742  ? APPEARANCEUR CLEAR 07/28/2021 1742  ? LABSPEC 1.003 (L) 07/28/2021 1742  ? PHURINE 9.0 (H) 07/28/2021 1742  ? GLUCOSEU NEGATIVE 07/28/2021 1742  ? HGBUR  NEGATIVE 07/28/2021 1742  ? BILIRUBINUR NEGATIVE 07/28/2021 1742  ? KETONESUR NEGATIVE 07/28/2021 1742  ? PROTEINUR NEGATIVE 07/28/2021 1742  ? UROBILINOGEN 0.2 10/28/2007 0214  ? NITRITE NEGATIVE 07/28/2021 1742  ? LEUKOCYTESUR NEGATIVE 07/28/2021 1742  ? ?Sepsis Labs: ?@LABRCNTIP (procalcitonin:4,lacticidven:4) ? ?) ?No results found for this or any previous visit (from the past 240 hour(s)). ?  ? ?Radiology Studies: ?No results found. ? ? ?Scheduled Meds: ? amiodarone  200 mg Oral BID  ? apixaban  2.5 mg Oral BID  ? atorvastatin  40 mg Oral Daily  ? diltiazem  360 mg Oral Daily  ? docusate sodium  100 mg Oral Daily  ? ezetimibe  10 mg Oral Daily  ? feeding supplement  237 mL Oral TID BM  ? feeding supplement (GLUCERNA SHAKE)  237 mL Oral TID BM  ? insulin aspart  0-9 Units Subcutaneous TID WC  ? insulin glargine-yfgn  20 Units Subcutaneous QHS  ? latanoprost  1 drop Both Eyes Daily  ? metFORMIN  1,000 mg Oral BID WC  ? metoprolol tartrate  100 mg Oral BID  ? multivitamin with minerals  1 tablet Oral Daily  ? pantoprazole  40 mg Oral Daily  ? ?Continuous Infusions: ? magnesium sulfate bolus IVPB    ? ? ? LOS: 11 days  ? ? ?Time spent: 30 min ? ? ? ?Shonna Chock, MD ?[facility] ? ? ?08/06/2021, 12:54 PM  ?  ?

## 2021-08-06 NOTE — Progress Notes (Addendum)
Nutrition Follow-up ? ?DOCUMENTATION CODES:  ? ?Severe malnutrition in context of chronic illness, Underweight ? ?INTERVENTION:  ? ?Continue to offer Ensure Enlive po TID, each supplement provides 350 kcal and 20 grams of protein. ? ?MVI with minerals daily. ? ?D/C Glucerna shakes.  ? ?NUTRITION DIAGNOSIS:  ? ?Severe Malnutrition related to chronic illness (stroke, PAD, DM) as evidenced by severe muscle depletion, severe fat depletion, percent weight loss (8% weight loss x 3 months). ? ?GOAL:  ? ?Patient will meet greater than or equal to 90% of their needs ? ?MONITOR:  ? ?PO intake, Supplement acceptance, Skin, Labs ? ?REASON FOR ASSESSMENT:  ? ?Malnutrition Screening Tool ?  ? ?ASSESSMENT:  ? ?88 yo female admitted with critical limb ischemia (left leg). S/P left common femoral endarterectomy and patch angioplasty 4/13. PMH includes HTN, DM, HLD, A fib, arthritis, PAD, stroke. ? ?Patient more awake today than last week. ?She responded "yes" when asked if she was eating better as well as when asked about her appetite.   ?Meal intakes documented at 50-100%. ?She is accepting Ensure supplements once or twice daily. Glucerna shakes were added today. Given severe malnutrition and increased nutrient needs, Ensure supplements are more appropriate d/t higher protein and calorie content. CBG's are well-controlled.  ? ?Plans for SNF placement soon.  ? ?Labs reviewed.  ?CBG: 143-96 ? ?Medications reviewed and include Novolog, Semglee, Glucophage, Protonix, MVI with minerals.  ? ?Diet Order:   ?Diet Order   ? ?       ?  Diet Carb Modified Fluid consistency: Thin; Room service appropriate? Yes  Diet effective now       ?  ? ?  ?  ? ?  ? ? ?EDUCATION NEEDS:  ? ?Not appropriate for education at this time ? ?Skin:  Skin Assessment: Reviewed RN Assessment ? ?Last BM:  4/22 ? ?Height:  ? ?Ht Readings from Last 1 Encounters:  ?07/31/21 4\' 11"  (1.499 m)  ? ? ?Weight:  ? ?Wt Readings from Last 1 Encounters:  ?07/31/21 37.8 kg   ? ? ?BMI:  Body mass index is 16.83 kg/m?. ? ?Estimated Nutritional Needs:  ? ?Kcal:  1300-1500 ? ?Protein:  55-65 gm ? ?Fluid:  1.3-1.5 L ? ? ? ?Lucas Mallow RD, LDN, CNSC ?Please refer to Amion for contact information.                                                       ? ?

## 2021-08-06 NOTE — Progress Notes (Addendum)
[facility] ? ?Subjective  - Alert and oriented to person and place. ? ? ?Objective ?115/79 ?97 ?(!) 97.3 ?F (36.3 ?C) (Axillary) ?17 ?95% ? ?Intake/Output Summary (Last 24 hours) at 08/06/2021 0752 ?Last data filed at 08/05/2021 2000 ?Gross per 24 hour  ?Intake 600 ml  ?Output 550 ml  ?Net 50 ml  ? ? ?Left groin soft with well healing incision ?Doppler signals AT/PT B LE ?Feet warm without Ischaemic changes ?Heart Irregularly irregular ? ? ?Assessment/Planning: ?She has a remote history of left above knee to below knee popliteal artery bypass graft on 07/23/10.   Most recent angiogram demonstrated near-occlusion of the left common femoral artery. ? s/p  L CFA endarterectomy ?POD # 11  ? ?She is being managed for Afib with Eliquis ?Confusion has improved  alert and oriented ?Pending SNF for rehab. ? ?Mosetta Pigeon ?08/06/2021 ?7:52 AM ?-- ? ?VASCULAR STAFF ADDENDUM: ?I have independently interviewed and examined the patient. ?I agree with the above.  ?Pt medically ready for d/c from vascular surgery perspective  ?Rest pain resolved  ? ?J. Gillis Santa, MD ?[facility] ?Office Phone Number: [phone] ?08/06/2021 2:24 PM ? ? ? ?Laboratory ?Lab Results: ?Recent Labs  ?  08/04/21 ?0122  ?WBC 6.5  ?HGB 10.4*  ?HCT 31.4*  ?PLT 240  ? ?BMET ?Recent Labs  ?  08/04/21 ?0122  ?NA 136  ?K 4.3  ?CL 99  ?CO2 27  ?GLUCOSE 154*  ?BUN 23  ?CREATININE 0.81  ?CALCIUM 9.2  ? ? ?COAG ?Lab Results  ?Component Value Date  ? INR 1.3 (H) 07/31/2021  ? INR 1.0 07/24/2021  ? INR 1.9 04/18/2015  ? PROTIME 17.0 09/27/2008  ? ?No results found for: PTT ? ? ? ?

## 2021-08-06 NOTE — Progress Notes (Signed)
Physical Therapy Treatment ?Patient Details ?Name: [name] ?MRN: [id number] ?DOB: [birth year] ?Today's Date: 08/06/2021 ? ? ?History of Present Illness 88 y.o. female who presented 07/26/21 for L CFA endarterectomy secondary to [place] 4 critical limb ischemia in the left leg. Code Stroke called after surgery when pt was disoriented and with L facial droop. MRI of head was negative with neuro signing off and indicating she would benefit from an outpatient dementia evaluation. Now she is in Afib RV/Aflutter. PMH: afib, DM, DVT, HTN, HLD, PAD, CVA. ? ?  ?PT Comments  ? ? Pt is progressing well towards her physical therapy goals, requiring less assist overall for mobility. Pt ambulating 40 ft with a walker and close chair follow. Demonstrates gait abnormalities, impaired balance, weakness and decreased activity tolerance. Continue to recommend SNF for ongoing Physical Therapy.   ?   ?Recommendations for follow up therapy are one component of a multi-disciplinary discharge planning process, led by the attending physician.  Recommendations may be updated based on patient status, additional functional criteria and insurance authorization. ? ?Follow Up Recommendations ? Skilled nursing-short term rehab (<3 hours/day) ?  ?  ?Assistance Recommended at Discharge Frequent or constant Supervision/Assistance  ?Patient can return home with the following A little help with walking and/or transfers;A little help with bathing/dressing/bathroom;Assistance with cooking/housework;Direct supervision/assist for medications management;Direct supervision/assist for financial management;Assist for transportation;Help with stairs or ramp for entrance ?  ?Equipment Recommendations ? None recommended by PT  ?  ?Recommendations for Other Services   ? ? ?  ?Precautions / Restrictions Precautions ?Precautions: Fall ?Restrictions ?Weight Bearing Restrictions: No  ?  ? ?Mobility ? Bed Mobility ?Overal bed mobility: Needs Assistance ?Bed  Mobility: Supine to Sit ?  ?  ?Supine to sit: Min guard ?  ?  ?General bed mobility comments: Initiating well, close min guard assist ?  ? ?Transfers ?Overall transfer level: Needs assistance ?Equipment used: Rolling walker (2 wheels) ?Transfers: Sit to/from Stand ?Sit to Stand: Min assist ?  ?  ?  ?  ?  ?General transfer comment: Light minA to power up to stand with RW ?  ? ?Ambulation/Gait ?Ambulation/Gait assistance: Min assist ?Gait Distance (Feet): 40 Feet ?Assistive device: Rolling walker (2 wheels) ?Gait Pattern/deviations: Step-to pattern, Decreased stride length, Shuffle, Trunk flexed, Narrow base of support, Decreased dorsiflexion - right, Decreased dorsiflexion - left ?Gait velocity: decreased ?Gait velocity interpretation: <1.8 ft/sec, indicate of risk for recurrent falls ?  ?General Gait Details: Pt requiring minA for balance and intermittent steering assist with RW. Cues for larger step lengths. chair follow utilized ? ? ?Stairs ?  ?  ?  ?  ?  ? ? ?Wheelchair Mobility ?  ? ?Modified Rankin (Stroke Patients Only) ?  ? ? ?  ?Balance Overall balance assessment: Needs assistance ?Sitting-balance support: Feet supported ?Sitting balance-Leahy Scale: Good ?  ?  ?Standing balance support: Bilateral upper extremity supported, During functional activity, Reliant on assistive device for balance ?Standing balance-Leahy Scale: Poor ?  ?  ?  ?  ?  ?  ?  ?  ?  ?  ?  ?  ?  ? ?  ?Cognition Arousal/Alertness: Awake/alert ?Behavior During Therapy: [facility] for tasks assessed/performed, Flat affect ?Overall Cognitive Status: History of cognitive impairments - at baseline ?  ?  ?  ?  ?  ?  ?  ?  ?  ?  ?  ?  ?  ?  ?  ?  ?General Comments: Pt pleasant and follows 1  step commands ?  ?  ? ?  ?Exercises   ? ?  ?General Comments   ?  ?  ? ?Pertinent Vitals/Pain Pain Assessment ?Pain Assessment: Faces ?Faces Pain Scale: Hurts a little bit ?Pain Location: L groin ?Pain Descriptors / Indicators: Discomfort ?Pain Intervention(s):  Monitored during session  ? ? ?Home Living   ?  ?  ?  ?  ?  ?  ?  ?  ?  ?   ?  ?Prior Function    ?  ?  ?   ? ?PT Goals (current goals can now be found in the care plan section) Acute Rehab PT Goals ?Potential to Achieve Goals: Good ?Progress towards PT goals: Progressing toward goals ? ?  ?Frequency ? ? ? Min 3X/week ? ? ? ?  ?PT Plan Current plan remains appropriate  ? ? ?Co-evaluation   ?  ?  ?  ?  ? ?  ?AM-PAC PT "6 Clicks" Mobility   ?Outcome Measure ? Help needed turning from your back to your side while in a flat bed without using bedrails?: A Little ?Help needed moving from lying on your back to sitting on the side of a flat bed without using bedrails?: A Little ?Help needed moving to and from a bed to a chair (including a wheelchair)?: A Little ?Help needed standing up from a chair using your arms (e.g., wheelchair or bedside chair)?: A Little ?Help needed to walk in hospital room?: A Little ?Help needed climbing 3-5 steps with a railing? : Total ?6 Click Score: 16 ? ?  ?End of Session Equipment Utilized During Treatment: Gait belt ?Activity Tolerance: Patient tolerated treatment well ?Patient left: in chair;with call bell/phone within reach;with chair alarm set ?Nurse Communication: Mobility status ?PT Visit Diagnosis: Unsteadiness on feet (R26.81);Other abnormalities of gait and mobility (R26.89);Muscle weakness (generalized) (M62.81);Difficulty in walking, not elsewhere classified (R26.2) ?  ? ? ?Time: [id number] ?PT Time Calculation (min) (ACUTE ONLY): 17 min ? ?Charges:  $Gait Training: 8-22 mins          ?          ? ?[name], PT, DPT ?Acute Rehabilitation Services ?Pager [phone] ?Office [phone] ? ? ? ?[name] ?08/06/2021, 3:15 PM ? ?

## 2021-08-06 NOTE — TOC Progression Note (Signed)
Transition of Care (TOC) - Progression Note  ? ? ?Patient Details  ?Name: [name] ?MRN: [id number] ?Date of Birth: 27-Aug-1932 ? ?Transition of Care (TOC) CM/SW Contact  ?Vinie Sill, LCSW ?Phone Number: ?08/06/2021, 12:07 PM ? ?Clinical Narrative:    ? ?CSW returned call to patient's son as requested. He advised he will be going to [facility] today to complete paperwork. He requested to email list of short term rehab beds. CSW sent information. ? ?Thurmond Butts, MSW, LCSW ?Clinical Social Worker ? ? ? ?Expected Discharge Plan: [place] ?Barriers to Discharge: Continued Medical Work up, Ship broker, SNF Pending bed offer ? ?Expected Discharge Plan and Services ?Expected Discharge Plan: [place] ?  ?  ?Post Acute Care Choice: [place] ?Living arrangements for the past 2 months: [place] ?                ?  ?  ?  ?  ?  ?  ?  ?  ?  ?  ? ? ?Social Determinants of Health (SDOH) Interventions ?  ? ?Readmission Risk Interventions ?   ? View : No data to display.  ?  ?  ?  ? ? ?

## 2021-08-07 ENCOUNTER — Other Ambulatory Visit: Payer: Self-pay

## 2021-08-07 ENCOUNTER — Encounter (HOSPITAL_COMMUNITY): Payer: Self-pay | Admitting: Vascular Surgery

## 2021-08-07 ENCOUNTER — Inpatient Hospital Stay (HOSPITAL_COMMUNITY)
Admission: RE | Admit: 2021-08-07 | Discharge: 2021-08-07 | Disposition: A | Discharge status: Home / Self Care | Payer: Medicare HMO | Source: Home / Self Care | Attending: Physician Assistant | Admitting: Physician Assistant

## 2021-08-07 ENCOUNTER — Inpatient Hospital Stay (HOSPITAL_COMMUNITY): Payer: Medicare HMO | Admitting: Certified Registered Nurse Anesthetist

## 2021-08-07 ENCOUNTER — Ambulatory Visit (HOSPITAL_COMMUNITY): Payer: Medicare HMO | Admitting: Nurse Practitioner

## 2021-08-07 ENCOUNTER — Encounter (HOSPITAL_COMMUNITY)
Admission: RE | Disposition: A | Discharge status: Skilled Nursing Facility | Payer: Self-pay | Source: Home / Self Care | Attending: Vascular Surgery

## 2021-08-07 DIAGNOSIS — I70222 Atherosclerosis of native arteries of extremities with rest pain, left leg: Secondary | ICD-10-CM | POA: Diagnosis not present

## 2021-08-07 DIAGNOSIS — I4891 Unspecified atrial fibrillation: Secondary | ICD-10-CM

## 2021-08-07 DIAGNOSIS — I4819 Other persistent atrial fibrillation: Secondary | ICD-10-CM

## 2021-08-07 DIAGNOSIS — E43 Unspecified severe protein-calorie malnutrition: Secondary | ICD-10-CM

## 2021-08-07 DIAGNOSIS — E1169 Type 2 diabetes mellitus with other specified complication: Secondary | ICD-10-CM

## 2021-08-07 DIAGNOSIS — I071 Rheumatic tricuspid insufficiency: Secondary | ICD-10-CM

## 2021-08-07 DIAGNOSIS — E1151 Type 2 diabetes mellitus with diabetic peripheral angiopathy without gangrene: Secondary | ICD-10-CM

## 2021-08-07 DIAGNOSIS — I10 Essential (primary) hypertension: Secondary | ICD-10-CM

## 2021-08-07 DIAGNOSIS — E785 Hyperlipidemia, unspecified: Secondary | ICD-10-CM

## 2021-08-07 HISTORY — PX: CARDIOVERSION: SHX1299

## 2021-08-07 HISTORY — PX: TEE WITHOUT CARDIOVERSION: SHX5443

## 2021-08-07 LAB — BASIC METABOLIC PANEL
Anion gap: 9 (ref 5–15)
BUN: 22 mg/dL (ref 8–23)
CO2: 27 mmol/L (ref 22–32)
Calcium: 9.4 mg/dL (ref 8.9–10.3)
Chloride: 100 mmol/L (ref 98–111)
Creatinine, Ser: 0.82 mg/dL (ref 0.44–1.00)
GFR, Estimated: >60 mL/min (ref >60)
Glucose, Bld: 233 mg/dL — ABNORMAL HIGH (ref 70–99)
Potassium: 4.4 mmol/L (ref 3.5–5.1)
Sodium: 136 mmol/L (ref 135–145)

## 2021-08-07 LAB — CBC
HCT: 32.5 % — ABNORMAL LOW (ref 36.0–46.0)
Hemoglobin: 11.3 g/dL — ABNORMAL LOW (ref 12.0–15.0)
MCH: 36.5 pg — ABNORMAL HIGH (ref 26.0–34.0)
MCHC: 34.8 g/dL (ref 30.0–36.0)
MCV: 104.8 fL — ABNORMAL HIGH (ref 80.0–100.0)
Platelets: 383 10*3/uL (ref 150–400)
RBC: 3.1 MIL/uL — ABNORMAL LOW (ref 3.87–5.11)
RDW: 13.3 % (ref 11.5–15.5)
WBC: 10.3 10*3/uL (ref 4.0–10.5)
nRBC: 0 % (ref 0.0–0.2)

## 2021-08-07 LAB — MAGNESIUM: Magnesium: 1.4 mg/dL — ABNORMAL LOW (ref 1.7–2.4)

## 2021-08-07 LAB — GLUCOSE, CAPILLARY
Glucose-Capillary: 136 mg/dL — ABNORMAL HIGH (ref 70–99)
Glucose-Capillary: 106 mg/dL — ABNORMAL HIGH (ref 70–99)
Glucose-Capillary: 108 mg/dL — ABNORMAL HIGH (ref 70–99)
Glucose-Capillary: 295 mg/dL — ABNORMAL HIGH (ref 70–99)
Glucose-Capillary: 198 mg/dL — ABNORMAL HIGH (ref 70–99)

## 2021-08-07 SURGERY — ECHOCARDIOGRAM, TRANSESOPHAGEAL
Anesthesia: General

## 2021-08-07 MED ORDER — PROPOFOL 10 MG/ML IV BOLUS
INTRAVENOUS | Status: DC | PRN
  Administered 2021-08-07 (×3): 20 mg via INTRAVENOUS

## 2021-08-07 MED ORDER — LIDOCAINE 2% (20 MG/ML) 5 ML SYRINGE
INTRAMUSCULAR | Status: DC | PRN
  Administered 2021-08-07: 40 mg via INTRAVENOUS

## 2021-08-07 MED ORDER — DILTIAZEM HCL ER COATED BEADS 240 MG PO CP24: 240.0000 mg | ORAL_CAPSULE | Freq: Every day | ORAL | 0 refills | Status: DC

## 2021-08-07 MED ORDER — METOPROLOL TARTRATE 50 MG PO TABS
50.0000 mg | ORAL_TABLET | Freq: Two times a day (BID) | ORAL | Status: DC
  Administered 2021-08-07 – 2021-08-08 (×3): 50 mg via ORAL
  Filled 2021-08-07 (×3): qty 1

## 2021-08-07 MED ORDER — DILTIAZEM HCL ER COATED BEADS 120 MG PO CP24
240.0000 mg | ORAL_CAPSULE | Freq: Every day | ORAL | Status: DC
  Administered 2021-08-07 – 2021-08-08 (×2): 240 mg via ORAL
  Filled 2021-08-07 (×2): qty 2

## 2021-08-07 MED ORDER — LANTUS SOLOSTAR 100 UNIT/ML ~~LOC~~ SOPN: 15.0000 [IU] | PEN_INJECTOR | Freq: Every day | SUBCUTANEOUS | 11 refills | Status: DC

## 2021-08-07 MED ORDER — PANTOPRAZOLE SODIUM 40 MG PO TBEC: 40.0000 mg | DELAYED_RELEASE_TABLET | Freq: Every day | ORAL | Status: DC

## 2021-08-07 MED ORDER — EZETIMIBE 10 MG PO TABS: 10.0000 mg | ORAL_TABLET | Freq: Every day | ORAL | 0 refills | Status: DC

## 2021-08-07 MED ORDER — PROPOFOL 500 MG/50ML IV EMUL
INTRAVENOUS | Status: DC | PRN
  Administered 2021-08-07: 75 ug/kg/min via INTRAVENOUS

## 2021-08-07 MED ORDER — GLUCERNA PO LIQD
237.0000 mL | Freq: Three times a day (TID) | ORAL | 0 refills | Status: DC
Start: 2021-08-07 — End: 2022-06-13

## 2021-08-07 MED ORDER — DOCUSATE SODIUM 100 MG PO CAPS
100.0000 mg | ORAL_CAPSULE | Freq: Every day | ORAL | Status: DC
  Administered 2021-08-07 – 2021-08-08 (×2): 100 mg via ORAL
  Filled 2021-08-07: qty 1

## 2021-08-07 MED ORDER — MUPIROCIN 2 % EX OINT: 1.0000 "application " | TOPICAL_OINTMENT | Freq: Two times a day (BID) | CUTANEOUS | 0 refills | Status: DC

## 2021-08-07 MED ORDER — INSULIN ASPART 100 UNIT/ML IJ SOLN: 0.0000 [IU] | Freq: Three times a day (TID) | INTRAMUSCULAR | 11 refills | Status: DC

## 2021-08-07 MED ORDER — DOCUSATE SODIUM 100 MG PO CAPS: 100.0000 mg | ORAL_CAPSULE | Freq: Every day | ORAL | 0 refills | Status: DC

## 2021-08-07 MED ORDER — AMIODARONE HCL 200 MG PO TABS: ORAL_TABLET | ORAL | 0 refills | Status: DC

## 2021-08-07 MED ORDER — INSULIN GLARGINE-YFGN 100 UNIT/ML ~~LOC~~ SOLN
16.0000 [IU] | Freq: Every day | SUBCUTANEOUS | Status: DC
  Administered 2021-08-07: 16 [IU] via SUBCUTANEOUS
  Filled 2021-08-07 (×2): qty 0.16

## 2021-08-07 MED ORDER — LISINOPRIL 20 MG PO TABS: 20.0000 mg | ORAL_TABLET | Freq: Every day | ORAL | 3 refills | Status: DC

## 2021-08-07 NOTE — Progress Notes (Addendum)
?  Progress Note ? ? ? ?08/07/2021 ?7:36 AM ?Day of Surgery ? ?Subjective:  no complaints. Transport here to take her for TEE/ DCCV ? ? ?Vitals:  ? 08/07/21 0505 08/07/21 0721  ?BP: 132/84 (!) 152/96  ?Pulse: (!) 116   ?Resp: 19 18  ?Temp: 98.1 ?F (36.7 ?C) (!) 97.4 ?F (36.3 ?C)  ?SpO2: 98% 99%  ? ?Physical Exam: ?Cardiac:  irregular ?Lungs:  non labored ?Incisions:  left groin incision c/d/I healing well ?Extremities:  well perfused and warm. PT doppler signal left, palpable right DP ?Abdomen:  flat, soft ?Neurologic: alert and oriented ? ?CBC ?   ?Component Value Date/Time  ? WBC 10.3 08/07/2021 0017  ? RBC 3.10 (L) 08/07/2021 0017  ? HGB 11.3 (L) 08/07/2021 0017  ? HGB 13.9 12/04/2020 0824  ? HCT 32.5 (L) 08/07/2021 0017  ? HCT 42.4 12/04/2020 0824  ? PLT 383 08/07/2021 0017  ? PLT 224 12/04/2020 0824  ? MCV 104.8 (H) 08/07/2021 0017  ? MCV 101 (H) 12/04/2020 0824  ? MCH 36.5 (H) 08/07/2021 0017  ? MCHC 34.8 08/07/2021 0017  ? RDW 13.3 08/07/2021 0017  ? RDW 11.9 12/04/2020 0824  ? LYMPHSABS 0.9 07/31/2021 0246  ? LYMPHSABS 1.7 04/22/2019 1420  ? MONOABS 1.5 (H) 07/31/2021 0246  ? EOSABS 0.0 07/31/2021 0246  ? EOSABS 0.1 04/22/2019 1420  ? BASOSABS 0.0 07/31/2021 0246  ? BASOSABS 0.0 04/22/2019 1420  ? ? ?BMET ?   ?Component Value Date/Time  ? NA 136 08/07/2021 0017  ? NA 142 12/04/2020 0824  ? K 4.4 08/07/2021 0017  ? CL 100 08/07/2021 0017  ? CO2 27 08/07/2021 0017  ? GLUCOSE 233 (H) 08/07/2021 0017  ? BUN 22 08/07/2021 0017  ? BUN 21 12/04/2020 0824  ? CREATININE 0.82 08/07/2021 0017  ? CREATININE 0.79 03/21/2016 1116  ? CALCIUM 9.4 08/07/2021 0017  ? GFRNONAA >60 08/07/2021 0017  ? GFRAA >60 06/26/2019 0457  ? ? ?INR ?   ?Component Value Date/Time  ? INR 1.3 (H) 07/31/2021 [id number]  ? INR 4.3 06/06/2010 1010  ? ? ? ?Intake/Output Summary (Last 24 hours) at 08/07/2021 0736 ?Last data filed at 08/07/2021 [id number] ?Gross per 24 hour  ?Intake 840 ml  ?Output 1425 ml  ?Net -585 ml  ? ? ? ?Assessment/Plan:  88 y.o. female is  s/p L CFA endarterectomy 12 Days Post Op ? ?For TEE/ DCCV this morning ?Afib remains uncontrolled ?Appreciate Cardiology assistance ?Left groin incision is c/d/I without swelling or hematoma ?Left leg well perfused and warm  ?Plan to d/c to [facility] today or tomorrow ? ? ?Karoline Caldwell, PA-C ?[facility] ?[id number] ?08/07/2021 ?7:36 AM ? ?VASCULAR STAFF ADDENDUM: ?I have independently interviewed and examined the patient. ?I agree with the above.  ?See s/p cardioversion - NSR ?No complaints, Tired ?Can be d/c'd today from vascular surgery perspective  ? ?J. Melene Muller, MD ?[facility] ?Office Phone Number: [phone] ?08/07/2021 9:44 AM ? ? ?

## 2021-08-07 NOTE — Anesthesia Postprocedure Evaluation (Signed)
Anesthesia Post Note ? ?Patient: [name] ? ?Procedure(s) Performed: TRANSESOPHAGEAL ECHOCARDIOGRAM (TEE) ?CARDIOVERSION ? ?  ? ?Patient location during evaluation: PACU ?Anesthesia Type: General ?Level of consciousness: patient cooperative and awake ?Pain management: pain level controlled ?Vital Signs Assessment: post-procedure vital signs reviewed and stable ?Respiratory status: spontaneous breathing, nonlabored ventilation and respiratory function stable ?Cardiovascular status: blood pressure returned to baseline and stable ?Postop Assessment: no apparent nausea or vomiting ?Anesthetic complications: no ? ? ?No notable events documented. ? ?Last Vitals:  ?Vitals:  ? 08/07/21 0927 08/07/21 1101  ?BP: 131/62 138/73  ?Pulse: 72 77  ?Resp: 18 16  ?Temp: 36.4 ?C 37.1 ?C  ?SpO2: 98% 98%  ?  ?Last Pain:  ?Vitals:  ? 08/07/21 1106  ?TempSrc:   ?PainSc: 6   ? ? ?  ?  ?  ?  ?  ?  ? ?Kahdijah Errickson ? ? ? ? ?

## 2021-08-07 NOTE — Progress Notes (Signed)
?  Echocardiogram ?Echocardiogram Transesophageal has been performed. ? ?Gerda Diss ?08/07/2021, 8:53 AM ?

## 2021-08-07 NOTE — Anesthesia Preprocedure Evaluation (Signed)
Anesthesia Evaluation  ?Patient identified by MRN, date of birth, ID band ?Patient confused ? ? ? ?Reviewed: ?Unable to perform ROS - Chart review only ? ?Airway ?Mallampati: Unable to assess ? ? ? ? ? ? Dental ?  ?Pulmonary ? ?  ?breath sounds clear to auscultation ? ? ? ? ? ? Cardiovascular ?hypertension, Pt. on medications and Pt. on home beta blockers ?+ Peripheral Vascular Disease  ?+ dysrhythmias Atrial Fibrillation  ?Rhythm:Irregular  ??1. Left ventricular ejection fraction, by estimation, is 50 to 55%. The  ?left ventricle has low normal function. The left ventricle has no regional  ?wall motion abnormalities. Indeterminate diastolic filling due to E-A  ?fusion.  ??2. Right ventricular systolic function is mildly reduced. The right  ?ventricular size is normal. There is normal pulmonary artery systolic  ?pressure.  ??3. The mitral valve is normal in structure. Trivial mitral valve  ?regurgitation.  ??4. The aortic valve was not well visualized. Aortic valve regurgitation  ?is not visualized.  ? ?Comparison(s): Compared to prior report in 2009, the patient is now in  ?Afib with RVR with LVEF 50-55%.  ?  ?Neuro/Psych ?  ? GI/Hepatic ?negative GI ROS, Neg liver ROS,   ?Endo/Other  ?diabetes ? Renal/GU ?Lab Results ?     Component                Value               Date                 ?     CREATININE               0.82                08/07/2021           ?  ? ?  ?Musculoskeletal ? ?(+) Arthritis ,  ? Abdominal ?  ?Peds ? Hematology ? ?(+) Blood dyscrasia, anemia , eliquis ? ?Lab Results ?     Component                Value               Date                 ?     WBC                      10.3                08/07/2021           ?     HGB                      11.3 (L)            08/07/2021           ?     HCT                      32.5 (L)            08/07/2021           ?     MCV                      104.8 (H)           08/07/2021           ?  PLT                      383                  08/07/2021           ?   ?Anesthesia Other Findings ? ? Reproductive/Obstetrics ? ?  ? ? ? ? ? ? ? ? ? ? ? ? ? ?  ?  ? ? ? ? ? ? ? ? ?Anesthesia Physical ?Anesthesia Plan ? ?ASA: 3 ? ?Anesthesia Plan: General  ? ?Post-op Pain Management:   ? ?Induction: Intravenous ? ?PONV Risk Score and Plan: 3 and Propofol infusion and Treatment may vary due to age or medical condition ? ?Airway Management Planned: Nasal Cannula and Natural Airway ? ?Additional Equipment: None ? ?Intra-op Plan:  ? ?Post-operative Plan:  ? ?Informed Consent:  ? ? ? ?History available from chart only ? ?Plan Discussed with: CRNA ? ?Anesthesia Plan Comments:   ? ? ? ? ? ? ?Anesthesia Quick Evaluation ? ?

## 2021-08-07 NOTE — Transfer of Care (Signed)
Immediate Anesthesia Transfer of Care Note ? ?Patient: [name] ? ?Procedure(s) Performed: TRANSESOPHAGEAL ECHOCARDIOGRAM (TEE) ?CARDIOVERSION ? ?Patient Location: PACU ? ?Anesthesia Type:MAC ? ?Level of Consciousness: drowsy ? ?Airway & Oxygen Therapy: Patient Spontanous Breathing and Patient connected to nasal cannula oxygen ? ?Post-op Assessment: Report given to RN and Post -op Vital signs reviewed and stable ? ?Post vital signs: Reviewed and stable ? ?Last Vitals:  ?Vitals Value Taken Time  ?BP 97/55 08/07/21 0838  ?Temp 36.4 ?C 08/07/21 0838  ?Pulse 62 08/07/21 0841  ?Resp 18 08/07/21 0841  ?SpO2 95 % 08/07/21 0841  ?Vitals shown include unvalidated device data. ? ?Last Pain:  ?Vitals:  ? 08/07/21 0736  ?TempSrc: Temporal  ?PainSc:   ?   ? ?Patients Stated Pain Goal: 0 (07/29/21 1051) ? ?Complications: No notable events documented. ?

## 2021-08-07 NOTE — Interval H&P Note (Signed)
History and Physical Interval Note: ? ?08/07/2021 ?7:43 AM ? ?[name]  has presented today for surgery, with the diagnosis of AFIB.  The various methods of treatment have been discussed with the patient and family. After consideration of risks, benefits and other options for treatment, the patient has consented to  Procedure(s): ?TRANSESOPHAGEAL ECHOCARDIOGRAM (TEE) (N/A) ?CARDIOVERSION (N/A) as a surgical intervention.  The patient's history has been reviewed, patient examined, no change in status, stable for surgery.  I have reviewed the patient's chart and labs.  Questions were answered to the patient's satisfaction.   ? ? ?Carolan Clines E ? ? ?

## 2021-08-07 NOTE — Progress Notes (Signed)
? ?Progress Note ? ? ? ?[name]  [id number] DOB: 09-28-1932 DOA: 07/26/2021 ?PCP: Lucianne Lei, MD  ?Narrative: [name] is a 88/F w/ h/o afib on Eliquis; DM; HTN; HLD; and CVA (1999) who was admitted on 4/13 with critical limb ischemia of the left leg.  She underwent left common femoral endarterectomy .  Postop course complicated by A-fib RVR requiring amiodarone and Cardizem gtt.  ?-Also noted to be increasingly confused in the hospital, at baseline has intermittent confusion  ?-Seen by cardiology, cardioversion was planned, but canceled on account of confusion, she went back into A-fib with RVR/a flutter, so TEE with cardioversion performed 4/25, ? ?Subjective: ?-No significant events overnight as discussed with staff, she denies any complaints.   ? ?Assessment and Plan: ? ?Critical limb ischemia of left lower extremity ([place]) ?-Management per vascular surgery ?-s/p femoral endarterectomy ?-Discharge planning, SNF recommended by physical therapy ? ?Vascular dementia with delirium ([place]) ?-Patient with neuropsych testing in 02/2021 that showed "significant and severe deficits in all cognitive domains assessed...   The level of impairment noted will strongly indicate the patient will need daily care and observation and is really functional level that would make living independent even if she is regularly checked on a safety concern". ?-Hospital delirium has resolved ?-Delirium precautions ?- hold opioids ? ?ATRIAL FIBRILLATION with RVR>> A-flutter, heart rate uncontrolled with 221 AV block ?-Seen by cardiology earlier, he required amiodarone drip, Cardizem drip, she was transitioned to oral Cardizem and metoprolol, and amiodarone has been added. ?-Initially plan for TEE with cardioversion has been canceled due to. ?-Cardiology signed off ?-Heart rate again uncontrolled, appears to be in a flutter with 2-1 block, discussed with cardiology, patient status post TEE with cardioversion today (4/25),  currently at normal sinus rhythm ?-With cardiology, discharge medication include ?-Amiodarone 200 mg p.o. twice daily 14 days, then 200 mg oral daily ?-Metoprolol 50 mg oral twice daily ?- Cardizem CD 360 mg oral daily ?-Continue apixaban for anticoagulation ? ?Essential hypertension ?Bp wnl ?-Continue above meds ? ?Type 2 diabetes mellitus with hyperlipidemia ([place]) ?-CBGs improved w/ increase in semglee ?- cont basal/bolus insulin ? ?Protein-calorie malnutrition, severe ?-Body mass index is 16.83 kg/m?Marland Kitchen.  ?-RD consult, add supplements ? ?- Patient can be discharged today from Hospitalist perspective, will discharge decision per primary vascular surgery team, I have updated patient discharge medication list. ?-Have updated son at bedside today. ? ?DVT prophylaxis: Apixaban ?Code Status: Full code ?Disposition Plan: ?Discharge planning per primary vascular team,  ? ? ? ?Objective: ?Vitals:  ? 08/07/21 0853 08/07/21 0908 08/07/21 0927 08/07/21 1101  ?BP: (!) 107/58 128/68 131/62 138/73  ?Pulse: 65 69 72 77  ?Resp: 15 19 18 16   ?Temp:   97.6 ?F (36.4 ?C) 98.7 ?F (37.1 ?C)  ?TempSrc:   Oral Oral  ?SpO2: 99% 97% 98% 98%  ?Weight:      ?Height:      ? ? ?Intake/Output Summary (Last 24 hours) at 08/07/2021 1449 ?Last data filed at 08/07/2021 1400 ?Gross per 24 hour  ?Intake 600 ml  ?Output 825 ml  ?Net -225 ml  ? ? ?Filed Weights  ? 07/26/21 0609 07/29/21 0400 07/31/21 0751  ?Weight: 38.1 kg 37.8 kg 37.8 kg  ? ? ?Examination: ? ?General exam: ? ?Awake Alert, pleasantly demented, impaired cognition and insight, ?Symmetrical Chest wall movement, Good air movement bilaterally, CTAB ?RRR,No Gallops,Rubs or new Murmurs, No Parasternal Heave ?+ve B.Sounds, Abd Soft, No tenderness, No rebound - guarding or rigidity. ?No  Cyanosis, Clubbing or edema, No new Rash or bruise, left groin surgical site with no dehiscence, discharge or bleed. ? ? ? ?Data Reviewed:  ? ?CBC: ?Recent Labs  ?Lab 08/01/21 ?[id number] 08/02/21 ?0248 08/03/21 ?0122  08/04/21 ?0122 08/07/21 ?0017  ?WBC 7.2 7.2 6.5 6.5 10.3  ?HGB 10.7* 11.0* 10.6* 10.4* 11.3*  ?HCT 30.9* 32.9* 30.9* 31.4* 32.5*  ?MCV 103.7* 104.1* 106.6* 105.4* 104.8*  ?PLT 175 211 209 240 383  ? ? ?Basic Metabolic Panel: ?Recent Labs  ?Lab 08/01/21 ?[id number] 08/02/21 ?0248 08/03/21 ?0122 08/04/21 ?0122 08/07/21 ?0017  ?NA 134* 134* 137 136 136  ?K 3.9 4.9 5.1 4.3 4.4  ?CL 99 99 104 99 100  ?CO2 28 26 24 27 27   ?GLUCOSE 158* 136* 323* 154* 233*  ?BUN 30* 28* 39* 23 22  ?CREATININE 1.02* 0.94 1.10* 0.81 0.82  ?CALCIUM 8.9 9.2 9.4 9.2 9.4  ?MG  --  1.8  --   --  1.4*  ? ? ?GFR: ?Estimated Creatinine Clearance: 29.4 mL/min (by C-G formula based on SCr of 0.82 mg/dL). ?Liver Function Tests: ?No results for input(s): AST, ALT, ALKPHOS, BILITOT, PROT, ALBUMIN in the last 168 hours. ?No results for input(s): LIPASE, AMYLASE in the last 168 hours. ?No results for input(s): AMMONIA in the last 168 hours. ?Coagulation Profile: ?No results for input(s): INR, PROTIME in the last 168 hours. ? ?Cardiac Enzymes: ?No results for input(s): CKTOTAL, CKMB, CKMBINDEX, TROPONINI in the last 168 hours. ?BNP (last 3 results) ?No results for input(s): PROBNP in the last 8760 hours. ?HbA1C: ?No results for input(s): HGBA1C in the last 72 hours. ?CBG: ?Recent Labs  ?Lab 08/06/21 ?1708 08/06/21 ?2139 08/07/21 ?[id number] 08/07/21 ?[id number] 08/07/21 ?1105  ?GLUCAP 102* 166* 136* 106* 108*  ? ? ?Lipid Profile: ?No results for input(s): CHOL, HDL, LDLCALC, TRIG, CHOLHDL, LDLDIRECT in the last 72 hours. ?Thyroid Function Tests: ?No results for input(s): TSH, T4TOTAL, FREET4, T3FREE, THYROIDAB in the last 72 hours. ?Anemia Panel: ?No results for input(s): VITAMINB12, FOLATE, FERRITIN, TIBC, IRON, RETICCTPCT in the last 72 hours. ?Urine analysis: ?   ?Component Value Date/Time  ? COLORURINE COLORLESS (A) 07/28/2021 1742  ? APPEARANCEUR CLEAR 07/28/2021 1742  ? LABSPEC 1.003 (L) 07/28/2021 1742  ? PHURINE 9.0 (H) 07/28/2021 1742  ? GLUCOSEU NEGATIVE 07/28/2021  1742  ? HGBUR NEGATIVE 07/28/2021 1742  ? [place] NEGATIVE 07/28/2021 1742  ? [place] NEGATIVE 07/28/2021 1742  ? PROTEINUR NEGATIVE 07/28/2021 1742  ? UROBILINOGEN 0.2 10/28/2007 0214  ? NITRITE NEGATIVE 07/28/2021 1742  ? LEUKOCYTESUR NEGATIVE 07/28/2021 1742  ? ?Sepsis Labs: ?@LABRCNTIP (procalcitonin:4,lacticidven:4) ? ?) ?Recent Results (from the past 240 hour(s))  ?Surgical PCR screen     Status: None  ? Collection Time: 08/06/21  4:30 PM  ? Specimen: Nasal Mucosa; Nasal Swab  ?Result Value Ref Range Status  ? MRSA, PCR NEGATIVE NEGATIVE Final  ? Staphylococcus aureus NEGATIVE NEGATIVE Final  ?  Comment: (NOTE) ?The Xpert SA Assay (FDA approved for NASAL specimens in patients 48 ?years of age and older), is one component of a comprehensive ?surveillance program. It is not intended to diagnose infection nor to ?guide or monitor treatment. ?Performed at [facility], [place] [address] [place], [place] ?[address] ?  ? ?  ? ?Radiology Studies: ?ECHO TEE ? ?Result Date: 08/07/2021 ?   TRANSESOPHOGEAL ECHO REPORT   Patient Name:   CERISE LENOIR Date of Exam: 08/07/2021 Medical Rec #:  [id number]        Height:  59.0 in Accession #:    [id number]       Weight:       83.3 lb Date of Birth:  [birth year]       BSA:          1.271 m? Patient Age:    65 years         BP:           159/99 mmHg Patient Gender: F                HR:           114 bpm. Exam Location:  Inpatient Procedure: Transesophageal Echo, Cardiac Doppler and Color Doppler Indications:     TEE with cardioversion  History:         Patient has prior history of Echocardiogram examinations, most                  recent 02/14/2020. Arrythmias:Atrial Fibrillation; Risk                  Factors:Hypertension and Diabetes. PAD.  Sonographer:     Clayton Lefort RDCS (AE) Referring Phys:  1993 RHONDA G BARRETT Diagnosing Phys: [place] PROCEDURE: After discussion of the risks and benefits of a TEE, an informed consent was obtained from the patient. The  transesophogeal probe was passed without difficulty through the esophogus of the patient. Sedation performed by different physician. The patient was monitored while under deep sedation. Anesthestetic sedatio

## 2021-08-07 NOTE — Progress Notes (Signed)
Occupational Therapy Treatment ?Patient Details ?Name: [name] ?MRN: [id number] ?DOB: 11-14-1932 ?Today's Date: 08/07/2021 ? ? ?History of present illness 88 y.o. female who presented 07/26/21 for L CFA endarterectomy secondary to [place] 4 critical limb ischemia in the left leg. Code Stroke called after surgery when pt was disoriented and with L facial droop. MRI of head was negative with neuro signing off and indicating she would benefit from an outpatient dementia evaluation. Now she is in Afib RV/Aflutter. PMH: afib, DM, DVT, HTN, HLD, PAD, CVA. ?  ?OT comments ? Session focused on progression of standing balance and endurance with ADLs at sink. Pt benefits from intermittent cues for initiation and problem solving due to cognitive deficits. However, pt eager to engage in activities and progressed to [place] A overall for ADL/short mobility tasks. Due to hands on assist needed during dynamic tasks to prevent fall, continue to rec SNF rehab unless family able to provide 24/7 support at DC.   ? ?Recommendations for follow up therapy are one component of a multi-disciplinary discharge planning process, led by the attending physician.  Recommendations may be updated based on patient status, additional functional criteria and insurance authorization. ?   ?Follow Up Recommendations ? Skilled nursing-short term rehab (<3 hours/day)  ?  ?Assistance Recommended at Discharge Frequent or constant Supervision/Assistance  ?Patient can return home with the following ? A lot of help with walking and/or transfers;A lot of help with bathing/dressing/bathroom;Assistance with feeding ?  ?Equipment Recommendations ? BSC/3in1;Wheelchair (measurements OT);Wheelchair cushion (measurements OT)  ?  ?Recommendations for Other Services   ? ?  ?Precautions / Restrictions Precautions ?Precautions: Fall ?Restrictions ?Weight Bearing Restrictions: No  ? ? ?  ? ?Mobility Bed Mobility ?Overal bed mobility: Needs Assistance ?Bed Mobility:  Supine to Sit, Sit to Supine ?  ?  ?Supine to sit: Min assist ?Sit to supine: Min assist ?  ?General bed mobility comments: assist to scoot hips EOB and increased time with use of bedrails. MIn A to get B LE back into bed ?  ? ?Transfers ?Overall transfer level: Needs assistance ?Equipment used: Rolling walker (2 wheels) ?Transfers: Sit to/from Stand ?Sit to Stand: Min guard ?  ?  ?  ?  ?  ?General transfer comment: able to power up from bedside and chair at sink though tendency to pull forward on RW ?  ?  ?Balance Overall balance assessment: Needs assistance ?Sitting-balance support: Feet supported ?Sitting balance-Leahy Scale: Good ?  ?  ?Standing balance support: Bilateral upper extremity supported, During functional activity, Reliant on assistive device for balance ?Standing balance-Leahy Scale: Poor ?Standing balance comment: able to briefly stand without support though unsteady ?  ?  ?  ?  ?  ?  ?  ?  ?  ?  ?  ?   ? ?ADL either performed or assessed with clinical judgement  ? ?ADL Overall ADL's : Needs assistance/impaired ?  ?  ?Grooming: Supervision/safety;Sitting;Wash/dry face;Applying deodorant ?Grooming Details (indicate cue type and reason): minor cues to initiate but able to complete seated at sink ?Upper Body Bathing: Minimal assistance ?Upper Body Bathing Details (indicate cue type and reason): assist to bathe back ?Lower Body Bathing: Minimal assistance;Sit to/from stand ?Lower Body Bathing Details (indicate cue type and reason): able to bathe B LE with assist to dry feet due to fatigue. Assist to maintain standing balance while pt performed peri care in standing ?Upper Body Dressing : Supervision/safety;Sitting ?  ?Lower Body Dressing: Minimal assistance;Sit to/from stand ?Lower Body Dressing Details (  indicate cue type and reason): able to doff/don socks with increased time crossing LEs. Will need assist to don items around waist in standing due to poor balance ?  ?  ?  ?  ?  ?  ?Functional mobility  during ADLs: Minimal assistance;Rolling walker (2 wheels) ?General ADL Comments: Noted purewick malfunction, focus on bathing tasks seated at sink with pt actively engaging with Min A at most. continued difficulty with mobility and assist for RW mgmt to/from sink ?  ? ?Extremity/Trunk Assessment Upper Extremity Assessment ?Upper Extremity Assessment: RUE deficits/detail;LUE deficits/detail ?RUE Deficits / Details: much improved grasp, able to use functionally ?LUE Deficits / Details: pain in forearm from IV site (had heat pack in place on entry) ?  ?Lower Extremity Assessment ?Lower Extremity Assessment: Defer to PT evaluation ?  ?  ?  ? ?Vision   ?Vision Assessment?: No apparent visual deficits ?  ?Perception   ?  ?Praxis   ?  ? ?Cognition Arousal/Alertness: Awake/alert ?Behavior During Therapy: [facility] for tasks assessed/performed, Flat affect ?Overall Cognitive Status: History of cognitive impairments - at baseline ?  ?  ?  ?  ?  ?  ?  ?  ?  ?  ?  ?  ?  ?  ?  ?  ?General Comments: Pt pleasant and follows 1 step commands. when asked about activities earlier in the day, pt reports going to the hospital for a procedure due to HR being too high. ?  ?  ?   ?Exercises   ? ?  ?Shoulder Instructions   ? ? ?  ?General Comments VSS on RA. NT prsent to assist in purewick mgmt though malfunction with wall suctioning. Secretary aware and calling maintenance for assistance after session  ? ? ?Pertinent Vitals/ Pain       Pain Assessment ?Pain Assessment: Faces ?Faces Pain Scale: Hurts a little bit ?Pain Location: L shoulder/UE ?Pain Descriptors / Indicators: Grimacing, Guarding ?Pain Intervention(s): Monitored during session ? ?Home Living   ?  ?  ?  ?  ?  ?  ?  ?  ?  ?  ?  ?  ?  ?  ?  ?  ?  ?  ? ?  ?Prior Functioning/Environment    ?  ?  ?  ?   ? ?Frequency ? Min 2X/week  ? ? ? ? ?  ?Progress Toward Goals ? ?OT Goals(current goals can now be found in the care plan section) ? Progress towards OT goals: Progressing toward  goals ? ?Acute Rehab OT Goals ?Patient Stated Goal: go home soon ?OT Goal Formulation: With patient/family ?Time For Goal Achievement: 08/10/21 ?Potential to Achieve Goals: Good ?ADL Goals ?Pt Will Perform Grooming: with supervision;standing ?Pt Will Perform Lower Body Dressing: with supervision;sit to/from stand ?Pt Will Transfer to Toilet: with supervision;ambulating;regular height toilet ?Pt Will Perform Toileting - Clothing Manipulation and hygiene: with supervision;sit to/from stand  ?Plan Discharge plan remains appropriate   ? ?Co-evaluation ? ? ?   ?  ?  ?  ?  ? ?  ?AM-PAC OT "6 Clicks" Daily Activity     ?Outcome Measure ? ? Help from another person eating meals?: A Little ?Help from another person taking care of personal grooming?: A Little ?Help from another person toileting, which includes using toliet, bedpan, or urinal?: A Lot ?Help from another person bathing (including washing, rinsing, drying)?: A Little ?Help from another person to put on and taking off regular upper body clothing?: A Little ?Help  from another person to put on and taking off regular lower body clothing?: A Little ?6 Click Score: 17 ? ?  ?End of Session Equipment Utilized During Treatment: Gait belt;Rolling walker (2 wheels) ? ?OT Visit Diagnosis: Unsteadiness on feet (R26.81);Other abnormalities of gait and mobility (R26.89);Muscle weakness (generalized) (M62.81) ?  ?Activity Tolerance Patient tolerated treatment well ?  ?Patient Left in bed;with call bell/phone within reach;with nursing/sitter in room ?  ?Nurse Communication   ?  ? ?   ? ?Time: [id number] ?OT Time Calculation (min): 34 min ? ?Charges: OT General Charges ?$OT Visit: 1 Visit ?OT Treatments ?$Self Care/Home Management : 23-37 mins ? ?Malachy Chamber, OTR/L ?Acute Rehab Services ?Office: [phone]  ? ?Layla Maw ?08/07/2021, 1:56 PM ?

## 2021-08-07 NOTE — CV Procedure (Signed)
Procedure: Electrical Cardioversion ?Indications:  Atrial Flutter ? ?Procedure Details: ? ?Consent: Risks of procedure as well as the alternatives and risks of each were explained to the (patient/caregiver).  Consent for procedure obtained. ? ?Time Out: Verified patient identification, verified procedure, site/side was marked, verified correct patient position, special equipment/implants available, medications/allergies/relevent history reviewed, required imaging and test results available. PERFORMED. ? ?Patient placed on cardiac monitor, pulse oximetry, supplemental oxygen as necessary.  ?Sedation given:  Propofol  ?Pacer pads placed anterior and posterior chest. ? ?Cardioverted 1 time(s).  ?Cardioversion with synchronized biphasic 120J shock. ? ?Evaluation: ?Findings: Post procedure EKG shows: NSR, PACs ?Complications: None ?Patient did tolerate procedure well. ? ? ? ?INDICATIONS: ?Left Atrial Appendage R/o Thrombus ? ?PROCEDURE:  ? ?Informed consent was obtained prior to the procedure. The risks, benefits and alternatives for the procedure were discussed and the patient comprehended these risks.  Risks include, but are not limited to, cough, sore throat, vomiting, nausea, somnolence, esophageal and stomach trauma or perforation, bleeding, low blood pressure, aspiration, pneumonia, infection, trauma to the teeth and death.   ? ?After a procedural time-out, the oropharynx was anesthetized with 20% benzocaine spray.  ? ?During this procedure the patient was administered propfol to achieve and maintain moderate conscious sedation.  The patient's heart rate, blood pressure, and oxygen saturationweare monitored continuously during the procedure. The period of conscious sedation was 20 minutes, of which I was present face-to-face 100% of this time. ? ?The transesophageal probe was inserted in the esophagus and stomach without difficulty and multiple views were obtained.  The patient was kept under observation until the  patient left the procedure room.  The patient left the procedure room in stable condition.  ? ?Agitated microbubble saline contrast was not administered. ? ?COMPLICATIONS:   ? ?There were no immediate complications. ? ?FINDINGS:  ?Low Normal LV/RV function ?Mild TR ?No left atrial appendage thrombus ? ?RECOMMENDATIONS:   ? ? Continue uninterrupted anticoagulation at least for 4 weeks ? ?Time Spent Directly with the Patient: ? ?20 minutes  ? ?Phineas Inches E ?08/07/2021, 8:35 AM ? ? ? ?

## 2021-08-07 NOTE — Progress Notes (Signed)
Pt coming back to rm 2 from PACU. Reinitiated tele. VSS. Call bell within reach. Bed alarm on.  ? ?Lawson Radar, RN ? ?

## 2021-08-07 NOTE — Progress Notes (Signed)
OT Cancellation Note ? ?Patient Details ?Name: [name] ?MRN: [id number] ?DOB: July 08, 1932 ? ? ?Cancelled Treatment:    Reason Eval/Treat Not Completed: Patient at procedure or test/ unavailable Off unit for TEE/cardioversion ? ?[name] ?08/07/2021, 8:04 AM ?

## 2021-08-07 NOTE — Anesthesia Procedure Notes (Signed)
Procedure Name: [facility] ?Date/Time: 08/07/2021 8:19 AM ?Performed by: Colin Benton, CRNA ?Pre-anesthesia Checklist: Patient identified, Suction available, Patient being monitored and Emergency Drugs available ?Patient Re-evaluated:Patient Re-evaluated prior to induction ?Oxygen Delivery Method: Nasal cannula ?Induction Type: IV induction ?Airway Equipment and Method: Bite block ?Placement Confirmation: positive ETCO2 ?Dental Injury: Teeth and Oropharynx as per pre-operative assessment  ? ? ? ? ?

## 2021-08-08 ENCOUNTER — Other Ambulatory Visit: Payer: Self-pay

## 2021-08-08 ENCOUNTER — Encounter (HOSPITAL_COMMUNITY): Payer: Self-pay | Admitting: Internal Medicine

## 2021-08-08 DIAGNOSIS — Z79899 Other long term (current) drug therapy: Secondary | ICD-10-CM | POA: Diagnosis not present

## 2021-08-08 DIAGNOSIS — K219 Gastro-esophageal reflux disease without esophagitis: Secondary | ICD-10-CM | POA: Diagnosis not present

## 2021-08-08 DIAGNOSIS — F01518 Vascular dementia, unspecified severity, with other behavioral disturbance: Secondary | ICD-10-CM | POA: Diagnosis not present

## 2021-08-08 DIAGNOSIS — R278 Other lack of coordination: Secondary | ICD-10-CM | POA: Diagnosis not present

## 2021-08-08 DIAGNOSIS — Z7401 Bed confinement status: Secondary | ICD-10-CM | POA: Diagnosis not present

## 2021-08-08 DIAGNOSIS — I639 Cerebral infarction, unspecified: Secondary | ICD-10-CM | POA: Diagnosis not present

## 2021-08-08 DIAGNOSIS — E119 Type 2 diabetes mellitus without complications: Secondary | ICD-10-CM | POA: Diagnosis not present

## 2021-08-08 DIAGNOSIS — R112 Nausea with vomiting, unspecified: Secondary | ICD-10-CM | POA: Diagnosis not present

## 2021-08-08 DIAGNOSIS — E7849 Other hyperlipidemia: Secondary | ICD-10-CM | POA: Diagnosis not present

## 2021-08-08 DIAGNOSIS — I480 Paroxysmal atrial fibrillation: Secondary | ICD-10-CM | POA: Diagnosis not present

## 2021-08-08 DIAGNOSIS — R262 Difficulty in walking, not elsewhere classified: Secondary | ICD-10-CM | POA: Diagnosis not present

## 2021-08-08 DIAGNOSIS — M62262 Nontraumatic ischemic infarction of muscle, left lower leg: Secondary | ICD-10-CM | POA: Diagnosis not present

## 2021-08-08 DIAGNOSIS — I779 Disorder of arteries and arterioles, unspecified: Secondary | ICD-10-CM | POA: Diagnosis not present

## 2021-08-08 DIAGNOSIS — I70222 Atherosclerosis of native arteries of extremities with rest pain, left leg: Secondary | ICD-10-CM | POA: Diagnosis not present

## 2021-08-08 DIAGNOSIS — I70209 Unspecified atherosclerosis of native arteries of extremities, unspecified extremity: Secondary | ICD-10-CM | POA: Diagnosis not present

## 2021-08-08 DIAGNOSIS — R2681 Unsteadiness on feet: Secondary | ICD-10-CM | POA: Diagnosis not present

## 2021-08-08 DIAGNOSIS — R293 Abnormal posture: Secondary | ICD-10-CM | POA: Diagnosis not present

## 2021-08-08 DIAGNOSIS — Z8673 Personal history of transient ischemic attack (TIA), and cerebral infarction without residual deficits: Secondary | ICD-10-CM | POA: Diagnosis not present

## 2021-08-08 DIAGNOSIS — I739 Peripheral vascular disease, unspecified: Secondary | ICD-10-CM | POA: Diagnosis not present

## 2021-08-08 DIAGNOSIS — I4891 Unspecified atrial fibrillation: Secondary | ICD-10-CM | POA: Diagnosis not present

## 2021-08-08 DIAGNOSIS — I10 Essential (primary) hypertension: Secondary | ICD-10-CM | POA: Diagnosis not present

## 2021-08-08 DIAGNOSIS — L98419 Non-pressure chronic ulcer of buttock with unspecified severity: Secondary | ICD-10-CM | POA: Diagnosis not present

## 2021-08-08 DIAGNOSIS — I743 Embolism and thrombosis of arteries of the lower extremities: Secondary | ICD-10-CM | POA: Diagnosis not present

## 2021-08-08 DIAGNOSIS — R531 Weakness: Secondary | ICD-10-CM | POA: Diagnosis not present

## 2021-08-08 DIAGNOSIS — E785 Hyperlipidemia, unspecified: Secondary | ICD-10-CM | POA: Diagnosis not present

## 2021-08-08 LAB — GLUCOSE, CAPILLARY
Glucose-Capillary: 97 mg/dL (ref 70–99)
Glucose-Capillary: 215 mg/dL — ABNORMAL HIGH (ref 70–99)

## 2021-08-08 MED ORDER — PANTOPRAZOLE SODIUM 40 MG PO TBEC: 40.0000 mg | DELAYED_RELEASE_TABLET | Freq: Every day | ORAL | 11 refills | Status: DC

## 2021-08-08 MED ORDER — AMIODARONE HCL 200 MG PO TABS: ORAL_TABLET | ORAL | 0 refills | Status: DC

## 2021-08-08 MED ORDER — INSULIN ASPART 100 UNIT/ML IJ SOLN: 0.0000 [IU] | Freq: Three times a day (TID) | INTRAMUSCULAR | 11 refills | Status: DC

## 2021-08-08 MED ORDER — MUPIROCIN 2 % EX OINT: 1.0000 "application " | TOPICAL_OINTMENT | Freq: Two times a day (BID) | CUTANEOUS | 0 refills | Status: DC

## 2021-08-08 MED ORDER — DOCUSATE SODIUM 100 MG PO CAPS: 100.0000 mg | ORAL_CAPSULE | Freq: Every day | ORAL | 0 refills | Status: DC

## 2021-08-08 NOTE — Care Management Important Message (Signed)
Important Message ? ?Patient Details  ?Name: [name] ?MRN: [id number] ?Date of Birth: [birth year] ? ? ?Medicare Important Message Given:  Yes ? ? ? ? ?Shelda Altes ?08/08/2021, 10:51 AM ?

## 2021-08-08 NOTE — TOC Transition Note (Signed)
Transition of Care (TOC) - CM/SW Discharge Note ? ? ?Patient Details  ?Name: [name] ?MRN: [id number] ?Date of Birth: 07/25/1932 ? ?Transition of Care (TOC) CM/SW Contact:  ?Eduard Roux, LCSW ?Phone Number: ?08/08/2021, 10:39 AM ? ? ?Clinical Narrative:    ?Patient will Discharge to: [facility]  ?Discharge Date: 08/08/2021 ?Family Notified: son ?Transport By: Sharin Mons ? ?Per MD patient is ready for discharge. RN, patient, and facility notified of discharge. Discharge Summary sent to facility. RN given number for [phone], room 3235. Ambulance transport requested for patient.  ? ?Clinical Social Worker signing off. ? ?Antony Blackbird, MSW, LCSW ?Clinical Social Worker ? ? ? ? ? ?Final next level of care: Skilled Nursing Facility ?Barriers to Discharge: Barriers Resolved ? ? ?Patient Goals and CMS Choice ?  ?  ?Choice offered to / list presented to : Adult Children ? ?Discharge Placement ?  ?           ?Patient [facility] bed at: [facility] ?Patient to be transferred to facility by: [facility] ?Name of family member notified: son ?Patient and family notified of of transfer: 08/08/21 ? ?Discharge Plan and Services ?  ?  ?Post Acute Care Choice: Skilled Nursing Facility          ?  ?  ?  ?  ?  ?  ?  ?  ?  ?  ? ?Social Determinants of Health (SDOH) Interventions ?  ? ? ?Readmission Risk Interventions ?   ? View : No data to display.  ?  ?  ?  ? ? ? ? ? ?

## 2021-08-08 NOTE — Progress Notes (Signed)
?PROGRESS NOTE ? ? ? ?[name]  YDX:[id number] DOB: 01/24/1933 DOA: 07/26/2021 ?PCP: [name], MD  ? ? ?Brief Narrative:  ?Admitted with critical limb ischemia of the left leg.  Status post surgical intervention. ? ? ?Assessment & Plan: ?  ?A-fib with RVR: Cardioversion 4/25.  Sinus rhythm.  Plan to discharge with amiodarone 200 mg twice daily for 14 days then 200 mg once daily.  Metoprolol 50 mg twice daily.  Cardizem CD 360 mg daily.  Therapeutic on Eliquis.  Continue. ? ?Critical limb ischemia of the left lower extremity: Status post femoral endarterectomy.  Stable as per surgery. ? ?Essential hypertension: Blood pressure stable. ? ?Type 2 diabetes: Continue basal insulin and metformin.  Glipizide discontinued.  Increase dose of insulin to 15 units.  Keep on sliding scale at the rehab facility. ? ?Patient examined along with vascular surgery.  Overall remains stable. ?Medication reconciliation reviewed, no changes needed.  She can be discharged from medical standpoint. ? ? ?DVT prophylaxis: apixaban (ELIQUIS) tablet 2.5 mg Start: 07/28/21 1000 ?SCD's Start: 07/26/21 1114 ?apixaban (ELIQUIS) tablet 2.5 mg  ? ?Code Status: Full code ?Family Communication: None ?Disposition Plan: Status is: Inpatient ?Going to SNF today ?  ? ? ?Consultants:  ?TRS ?Cardiology ? ?Procedures:  ?Femoral endarterectomy ?TEE cardioversion ? ?Antimicrobials:  ?None ? ? ?Subjective: ?Patient seen and examined.  No overnight events.  Pleasant.  Flat affect.  Complaints of pigmentation around the left ankle but denies any pain. ? ?Objective: ?Vitals:  ? 08/07/21 2129 08/08/21 0038 08/08/21 0505 08/08/21 0738  ?BP: 136/63 (!) 100/52 132/64 (!) 167/71  ?Pulse: 73 61 64 71  ?Resp:  17 20 18   ?Temp:  97.9 ?F (36.6 ?C) (!) 97.5 ?F (36.4 ?C) (!) 97.4 ?F (36.3 ?C)  ?TempSrc:  Oral Oral Oral  ?SpO2:  98% 96%   ?Weight:      ?Height:      ? ? ?Intake/Output Summary (Last 24 hours) at 08/08/2021 0955 ?Last data filed at 08/08/2021 08/10/2021 ?Gross  per 24 hour  ?Intake 480 ml  ?Output 150 ml  ?Net 330 ml  ? ?Filed Weights  ? 07/26/21 0609 07/29/21 0400 07/31/21 0751  ?Weight: 38.1 kg 37.8 kg 37.8 kg  ? ? ?Examination: ? ?General: Looks fairly comfortable.  Frail and debilitated.  On room air. ?Cardiovascular: S1-S2 normal.  Regular rate rhythm.  Telemetry with sinus rhythm. ?Respiratory: Bilateral clear. ?Gastrointestinal: Soft nontender. ?Ext: No swelling or edema.  Doppler pulse.  Left distal toes with some dark discoloration in the skin, she has some pigmentation around the ankle. ? ? ? ? ? ?Data Reviewed: I have personally reviewed following labs and imaging studies ? ?CBC: ?Recent Labs  ?Lab 08/02/21 ?0248 08/03/21 ?0122 08/04/21 ?0122 08/07/21 ?0017  ?WBC 7.2 6.5 6.5 10.3  ?HGB 11.0* 10.6* 10.4* 11.3*  ?HCT 32.9* 30.9* 31.4* 32.5*  ?MCV 104.1* 106.6* 105.4* 104.8*  ?PLT 211 209 240 383  ? ?Basic Metabolic Panel: ?Recent Labs  ?Lab 08/02/21 ?0248 08/03/21 ?0122 08/04/21 ?0122 08/07/21 ?0017  ?NA 134* 137 136 136  ?K 4.9 5.1 4.3 4.4  ?CL 99 104 99 100  ?CO2 26 24 27 27   ?GLUCOSE 136* 323* 154* 233*  ?BUN 28* 39* 23 22  ?CREATININE 0.94 1.10* 0.81 0.82  ?CALCIUM 9.2 9.4 9.2 9.4  ?MG 1.8  --   --  1.4*  ? ?GFR: ?Estimated Creatinine Clearance: 29.4 mL/min (by C-G formula based on SCr of 0.82 mg/dL). ?Liver Function Tests: ?No results for  input(s): AST, ALT, ALKPHOS, BILITOT, PROT, ALBUMIN in the last 168 hours. ?No results for input(s): LIPASE, AMYLASE in the last 168 hours. ?No results for input(s): AMMONIA in the last 168 hours. ?Coagulation Profile: ?No results for input(s): INR, PROTIME in the last 168 hours. ?Cardiac Enzymes: ?No results for input(s): CKTOTAL, CKMB, CKMBINDEX, TROPONINI in the last 168 hours. ?BNP (last 3 results) ?No results for input(s): PROBNP in the last 8760 hours. ?HbA1C: ?No results for input(s): HGBA1C in the last 72 hours. ?CBG: ?Recent Labs  ?Lab 08/07/21 ?7902 08/07/21 ?1105 08/07/21 ?1657 08/07/21 ?2124 08/08/21 ?4097   ?GLUCAP 106* 108* 295* 198* 97  ? ?Lipid Profile: ?No results for input(s): CHOL, HDL, LDLCALC, TRIG, CHOLHDL, LDLDIRECT in the last 72 hours. ?Thyroid Function Tests: ?No results for input(s): TSH, T4TOTAL, FREET4, T3FREE, THYROIDAB in the last 72 hours. ?Anemia Panel: ?No results for input(s): VITAMINB12, FOLATE, FERRITIN, TIBC, IRON, RETICCTPCT in the last 72 hours. ?Sepsis Labs: ?No results for input(s): PROCALCITON, LATICACIDVEN in the last 168 hours. ? ?Recent Results (from the past 240 hour(s))  ?Surgical PCR screen     Status: None  ? Collection Time: 08/06/21  4:30 PM  ? Specimen: Nasal Mucosa; Nasal Swab  ?Result Value Ref Range Status  ? MRSA, PCR NEGATIVE NEGATIVE Final  ? Staphylococcus aureus NEGATIVE NEGATIVE Final  ?  Comment: (NOTE) ?The Xpert SA Assay (FDA approved for NASAL specimens in patients 26 ?years of age and older), is one component of a comprehensive ?surveillance program. It is not intended to diagnose infection nor to ?guide or monitor treatment. ?Performed at [facility], [address] [place], [place] ?[address] ?  ?  ? ? ? ? ? ?Radiology Studies: ?ECHO TEE ? ?Result Date: 08/07/2021 ?   TRANSESOPHOGEAL ECHO REPORT   Patient Name:   [name] Date of Exam: 08/07/2021 Medical Rec #:  [id number]        Height:       59.0 in Accession #:    [id number]       Weight:       83.3 lb Date of Birth:  06-27-1932       BSA:          1.271 m? Patient Age:    88 years         BP:           159/99 mmHg Patient Gender: F                HR:           114 bpm. Exam Location:  Inpatient Procedure: Transesophageal Echo, Cardiac Doppler and Color Doppler Indications:     TEE with cardioversion  History:         Patient has prior history of Echocardiogram examinations, most                  recent 02/14/2020. Arrythmias:Atrial Fibrillation; Risk                  Factors:Hypertension and Diabetes. PAD.  Sonographer:     Ross Ludwig RDCS (AE) Referring Phys:  1993 RHONDA G BARRETT Diagnosing  Phys: [place] PROCEDURE: After discussion of the risks and benefits of a TEE, an informed consent was obtained from the patient. The transesophogeal probe was passed without difficulty through the esophogus of the patient. Sedation performed by different physician. The patient was monitored while under deep sedation. Anesthestetic sedation was provided intravenously by Anesthesiology: 94.02mg   of Propofol, 40mg  of Lidocaine. Image quality was good. The patient developed no complications during the procedure. IMPRESSIONS  1. Left ventricular ejection fraction, by estimation, is 50 to 55%. The left ventricle has low normal function.  2. Right ventricular systolic function is low normal. The right ventricular size is normal.  3. No left atrial/left atrial appendage thrombus was detected. The LAA emptying velocity was 30 cm/s.  4. The mitral valve is normal in structure. No evidence of mitral valve regurgitation.  5. The aortic valve is normal in structure. Aortic valve regurgitation is not visualized.  6. There is mild (Grade II) plaque. Conclusion(s)/Recommendation(s): No LA/LAA thrombus identified. Successful cardioversion performed with restoration of normal sinus rhythm. FINDINGS  Left Ventricle: Left ventricular ejection fraction, by estimation, is 50 to 55%. The left ventricle has low normal function. The left ventricular internal cavity size was normal in size. Right Ventricle: The right ventricular size is normal. No increase in right ventricular wall thickness. Right ventricular systolic function is low normal. Left Atrium: No left atrial/left atrial appendage thrombus was detected. The LAA emptying velocity was 30 cm/s. Pericardium: There is no evidence of pericardial effusion. Mitral Valve: The mitral valve is normal in structure. No evidence of mitral valve regurgitation. Tricuspid Valve: The tricuspid valve is normal in structure. Tricuspid valve regurgitation is mild. Aortic Valve: The aortic valve is  normal in structure. Aortic valve regurgitation is not visualized. Pulmonic Valve: The pulmonic valve was normal in structure. Pulmonic valve regurgitation is not visualized. Aorta: The aortic root and ascendi

## 2021-08-08 NOTE — Progress Notes (Signed)
Called report to Madhu, RN at [facility].  ? ?Lawson Radar, RN ? ?

## 2021-08-08 NOTE — Progress Notes (Signed)
?  Progress Note ? ? ? ?08/08/2021 ?7:22 AM ?1 Day Post-Op ? ?Subjective:  no complaints. Ready to go to SNF ? ? ?Vitals:  ? 08/08/21 0038 08/08/21 0505  ?BP: (!) 100/52 132/64  ?Pulse: 61 64  ?Resp: 17 20  ?Temp: 97.9 ?F (36.6 ?C) (!) 97.5 ?F (36.4 ?C)  ?SpO2: 98% 96%  ? ?Physical Exam: ?Cardiac:  regular ?Lungs:  non labored ?Incisions:  left groin incision is c/d/I, healing well ?Extremities:  well perfused and warm. Doppler left PT. Palpable right DP ?Abdomen:  flat, soft ?Neurologic: alert and oriented ? ?CBC ?   ?Component Value Date/Time  ? WBC 10.3 08/07/2021 0017  ? RBC 3.10 (L) 08/07/2021 0017  ? HGB 11.3 (L) 08/07/2021 0017  ? HGB 13.9 12/04/2020 0824  ? HCT 32.5 (L) 08/07/2021 0017  ? HCT 42.4 12/04/2020 0824  ? PLT 383 08/07/2021 0017  ? PLT 224 12/04/2020 0824  ? MCV 104.8 (H) 08/07/2021 0017  ? MCV 101 (H) 12/04/2020 0824  ? MCH 36.5 (H) 08/07/2021 0017  ? MCHC 34.8 08/07/2021 0017  ? RDW 13.3 08/07/2021 0017  ? RDW 11.9 12/04/2020 0824  ? LYMPHSABS 0.9 07/31/2021 0246  ? LYMPHSABS 1.7 04/22/2019 1420  ? MONOABS 1.5 (H) 07/31/2021 0246  ? EOSABS 0.0 07/31/2021 0246  ? EOSABS 0.1 04/22/2019 1420  ? BASOSABS 0.0 07/31/2021 0246  ? BASOSABS 0.0 04/22/2019 1420  ? ? ?BMET ?   ?Component Value Date/Time  ? NA 136 08/07/2021 0017  ? NA 142 12/04/2020 0824  ? K 4.4 08/07/2021 0017  ? CL 100 08/07/2021 0017  ? CO2 27 08/07/2021 0017  ? GLUCOSE 233 (H) 08/07/2021 0017  ? BUN 22 08/07/2021 0017  ? BUN 21 12/04/2020 0824  ? CREATININE 0.82 08/07/2021 0017  ? CREATININE 0.79 03/21/2016 1116  ? CALCIUM 9.4 08/07/2021 0017  ? GFRNONAA >60 08/07/2021 0017  ? GFRAA >60 06/26/2019 0457  ? ? ?INR ?   ?Component Value Date/Time  ? INR 1.3 (H) 07/31/2021 6644  ? INR 4.3 06/06/2010 1010  ? ? ? ?Intake/Output Summary (Last 24 hours) at 08/08/2021 0347 ?Last data filed at 08/07/2021 1700 ?Gross per 24 hour  ?Intake 240 ml  ?Output 150 ml  ?Net 90 ml  ? ? ? ?Assessment/Plan:  88 y.o. female is s/p L CFA endarterectomy 14 Days  post op ? ?Left groin incision healing well. Left leg well perfused with doppler PT signal ?Remains in NSR s/p TEE and Cardioversion yesterday 4/26 ?Appreciate cardiology assistance ?Appreciate TRH assistance with medical management ?Remains stable for discharge today to SNF ?She has outpatient cardiology follow up arranged ?She will follow up in our office in 1 month ? ? ?Graceann Congress, PA-C ?[facility] ?[phone] ?08/08/2021 ?7:22 AM ?

## 2021-08-08 NOTE — Patient Outreach (Signed)
[facility] Customer service manager [facility]) Care Management ? ?08/08/2021 ? ?[name] ?11-18-1932 ?[id number] ? ? ?Patient recently admitted to hospital.  Patient discharged to SNF.  RN CM will close case.   ? ?[name], RN, MSN ?[facility] Care Management ?Care Management Coordinator ?Direct Line [phone] ?Toll Free: [phone]  ?Fax: [phone] ? ?

## 2021-08-09 DIAGNOSIS — I639 Cerebral infarction, unspecified: Secondary | ICD-10-CM | POA: Diagnosis not present

## 2021-08-09 DIAGNOSIS — E785 Hyperlipidemia, unspecified: Secondary | ICD-10-CM | POA: Diagnosis not present

## 2021-08-09 DIAGNOSIS — I743 Embolism and thrombosis of arteries of the lower extremities: Secondary | ICD-10-CM | POA: Diagnosis not present

## 2021-08-09 DIAGNOSIS — F01518 Vascular dementia, unspecified severity, with other behavioral disturbance: Secondary | ICD-10-CM | POA: Diagnosis not present

## 2021-08-09 DIAGNOSIS — I10 Essential (primary) hypertension: Secondary | ICD-10-CM | POA: Diagnosis not present

## 2021-08-09 DIAGNOSIS — K219 Gastro-esophageal reflux disease without esophagitis: Secondary | ICD-10-CM | POA: Diagnosis not present

## 2021-08-09 DIAGNOSIS — I739 Peripheral vascular disease, unspecified: Secondary | ICD-10-CM | POA: Diagnosis not present

## 2021-08-09 DIAGNOSIS — I4891 Unspecified atrial fibrillation: Secondary | ICD-10-CM | POA: Diagnosis not present

## 2021-08-09 DIAGNOSIS — E119 Type 2 diabetes mellitus without complications: Secondary | ICD-10-CM | POA: Diagnosis not present

## 2021-08-10 DIAGNOSIS — I70209 Unspecified atherosclerosis of native arteries of extremities, unspecified extremity: Secondary | ICD-10-CM | POA: Diagnosis not present

## 2021-08-10 DIAGNOSIS — I10 Essential (primary) hypertension: Secondary | ICD-10-CM | POA: Diagnosis not present

## 2021-08-10 DIAGNOSIS — I739 Peripheral vascular disease, unspecified: Secondary | ICD-10-CM | POA: Diagnosis not present

## 2021-08-10 DIAGNOSIS — I480 Paroxysmal atrial fibrillation: Secondary | ICD-10-CM | POA: Diagnosis not present

## 2021-08-13 DIAGNOSIS — I4891 Unspecified atrial fibrillation: Secondary | ICD-10-CM | POA: Diagnosis not present

## 2021-08-13 DIAGNOSIS — F01518 Vascular dementia, unspecified severity, with other behavioral disturbance: Secondary | ICD-10-CM | POA: Diagnosis not present

## 2021-08-13 DIAGNOSIS — I10 Essential (primary) hypertension: Secondary | ICD-10-CM | POA: Diagnosis not present

## 2021-08-13 DIAGNOSIS — E785 Hyperlipidemia, unspecified: Secondary | ICD-10-CM | POA: Diagnosis not present

## 2021-08-13 DIAGNOSIS — E119 Type 2 diabetes mellitus without complications: Secondary | ICD-10-CM | POA: Diagnosis not present

## 2021-08-15 ENCOUNTER — Ambulatory Visit (INDEPENDENT_AMBULATORY_CARE_PROVIDER_SITE_OTHER): Payer: Medicare HMO | Admitting: Physician Assistant

## 2021-08-15 ENCOUNTER — Encounter: Payer: Self-pay | Admitting: Physician Assistant

## 2021-08-15 VITALS — BP 154/62 | HR 64 | Ht 62.0 in | Wt 85.8 lb

## 2021-08-15 DIAGNOSIS — Z8673 Personal history of transient ischemic attack (TIA), and cerebral infarction without residual deficits: Secondary | ICD-10-CM

## 2021-08-15 DIAGNOSIS — I480 Paroxysmal atrial fibrillation: Secondary | ICD-10-CM

## 2021-08-15 DIAGNOSIS — I739 Peripheral vascular disease, unspecified: Secondary | ICD-10-CM

## 2021-08-15 DIAGNOSIS — E119 Type 2 diabetes mellitus without complications: Secondary | ICD-10-CM

## 2021-08-15 DIAGNOSIS — I10 Essential (primary) hypertension: Secondary | ICD-10-CM | POA: Diagnosis not present

## 2021-08-15 DIAGNOSIS — Z79899 Other long term (current) drug therapy: Secondary | ICD-10-CM

## 2021-08-15 DIAGNOSIS — E785 Hyperlipidemia, unspecified: Secondary | ICD-10-CM | POA: Diagnosis not present

## 2021-08-15 LAB — HEPATIC FUNCTION PANEL
ALT: 49 IU/L — ABNORMAL HIGH (ref 0–32)
AST: 23 IU/L (ref 0–40)
Albumin: 3.8 g/dL (ref 3.6–4.6)
Alkaline Phosphatase: 109 IU/L (ref 44–121)
Bilirubin Total: 0.2 mg/dL (ref 0.0–1.2)
Bilirubin, Direct: <0.1 mg/dL (ref 0.00–0.40)
Total Protein: 6.5 g/dL (ref 6.0–8.5)

## 2021-08-15 LAB — TSH: TSH: 3.38 u[IU]/mL (ref 0.450–4.500)

## 2021-08-15 NOTE — Patient Instructions (Signed)
Medication Instructions:  ?Your physician recommends that you continue on your current medications as directed. Please refer to the Current Medication list given to you today. ? ?*If you need a refill on your cardiac medications before your next appointment, please call your pharmacy* ? ?Lab Work: ?Your physician recommends that you return for lab work TODAY:  ?Hepatic (Liver) Function Test  ?TSH ?If you have labs (blood work) drawn today and your tests are completely normal, you will receive your results only by: ?MyChart Message (if you have MyChart) OR ?A paper copy in the mail ?If you have any lab test that is abnormal or we need to change your treatment, we will call you to review the results. ? ?Testing/Procedures: ?A chest x-ray takes a picture of the organs and structures inside the chest, including the heart, lungs, and blood vessels. This test can show several things, including, whether the heart is enlarges; whether fluid is building up in the lungs; and whether pacemaker / defibrillator leads are still in place. This test is performed at [address] [address] [place], [place] [address] ? ?To be completed within 2 weeks  ? ?Follow-Up: ?At [facility], you and your health needs are our priority.  As part of our continuing mission to provide you with exceptional heart care, we have created designated Provider Care Teams.  These Care Teams include your primary Cardiologist (physician) and Advanced Practice Providers (APPs -  Physician Assistants and Nurse Practitioners) who all work together to provide you with the care you need, when you need it. ? ?We recommend signing up for the patient portal called "MyChart".  Sign up information is provided on this After Visit Summary.  MyChart is used to connect with patients for Virtual Visits (Telemedicine).  Patients are able to view lab/test results, encounter notes, upcoming appointments, etc.  Non-urgent messages can be sent to your provider as  well.   ?To learn more about what you can do with MyChart, go to [email or web address].   ? ?Your next appointment:   ?3-4 month(s) ? ?The format for your next appointment:   ?In Person ? ?Provider:   ?Olga Millers, MD   ? ? ? ?Other Instructions ? ? ?Important Information About Sugar ? ? ? ? ? ? ?

## 2021-08-15 NOTE — Progress Notes (Signed)
Thyroid and liver function stable.

## 2021-08-15 NOTE — Progress Notes (Signed)
?Cardiology Office Note:   ? ?Date:  08/17/2021  ? ?ID:  [name], DOB Aug 31, 1932, MRN [id number] ? ?PCP:  Lucianne Lei, MD ?  ?[facility] Providers ?Cardiologist:  Kirk Ruths, MD    ? ?Referring MD: Lucianne Lei, MD  ? ?Chief Complaint  ?Patient presents with  ? Follow-up  ?  Seen for Dr. Stanford Breed  ? ? ?History of Present Illness:   ? ?[name] is a 88 y.o. female with a hx of PAF/atrial flutter, history of DVT, hypertension, hyperlipidemia, DM2, and history of CVA.  Myoview performed in May 2007 showed EF 80%, normal perfusion.  She is being followed by vascular surgery for peripheral arterial disease.  ABI obtained in December 2021 showed a noncompressible artery in both lower extremity.  Echocardiogram in November 2021 showed EF 50 to 55%, mild RV dysfunction.  She previously had a cardioversion in November 2021.  Patient was last seen by Dr. Stanford Breed in February 2023 at which time she was doing well.  She underwent lower extremity angiography by Dr. Unk Lightning on 07/26/2021 due to critical limb ischemia, the study demonstrated near occlusion of left common femoral artery with only profunda runoff with reconstitution of the posterior tibial artery distally.  She ultimately underwent left common femoral artery enterectomy.  She was heparinized during the procedure.  Prior to the procedure, her Eliquis was held for 2 days in preparation for the surgery.  Unfortunately, right after the surgery, she had left facial puffy and asymmetric facial smile, code stroke was activated.  MRI of the brain fortunately was negative.  During the hospitalization, patient had recurrent atrial fibrillation with RVR.  Cardiology was called.  TEE DCCV was attempted however canceled due to mental status change.  She was started on amiodarone for rate control on top of metoprolol and diltiazem.  Patient ultimately underwent TEE DCCV on 08/07/2021.  TEE demonstrated EF 50 to 55%, no left atrial thrombus was noted, no  significant valve issue. ? ?Patient presents today for follow-up.  She is maintaining normal sinus rhythm.  Her mental status has returned back to baseline.  She currently resides in [place] and is doing physical therapy quite well.  She did not have any cardiac awareness of atrial fibrillation.  Given the initiation of amiodarone therapy, I recommend LFT and TSH today.  She will also need repeat chest x-ray in within the next 2 weeks.  She can follow-up with Dr. Stanford Breed in 3 months.  Otherwise she appears to be euvolemic on exam. ? ?Past Medical History:  ?Diagnosis Date  ? Arthritis   ? Atrial fibrillation ([place])   ? Diabetes mellitus   ? DVT (deep venous thrombosis) ([place])   ? Hyperlipidemia   ? Hypertension   ? Leg pain   ? Peripheral arterial disease ([place])   ? Stroke [facility]) 1999  ? ? ?Past Surgical History:  ?Procedure Laterality Date  ? ABDOMINAL AORTOGRAM W/LOWER EXTREMITY N/A 07/11/2021  ? Procedure: ABDOMINAL AORTOGRAM W/LOWER EXTREMITY;  Surgeon: Broadus John, MD;  Location: [place] [facility];  Service: Cardiovascular;  Laterality: N/A;  ? [place]  ? CARDIOVERSION N/A 02/15/2020  ? Procedure: CARDIOVERSION;  Surgeon: Thayer Headings, MD;  Location: [place];  Service: Cardiovascular;  Laterality: N/A;  ? CARDIOVERSION N/A 08/07/2021  ? Procedure: CARDIOVERSION;  Surgeon: Janina Mayo, MD;  Location: [place];  Service: Cardiovascular;  Laterality: N/A;  ? CATARACT EXTRACTION  2000  ? CESAREAN SECTION    ?  ENDARTERECTOMY FEMORAL Left 07/26/2021  ? Procedure: LEFT COMMON FEMORAL ENDARTERECTOMY;  Surgeon: Broadus John, MD;  Location: [place];  Service: Vascular;  Laterality: Left;  ? EYE SURGERY    ? FEMORAL BYPASS Left July 23, 2010  ? LUMBAR DISC SURGERY    ? PATCH ANGIOPLASTY Left 07/26/2021  ? Procedure: PATCH ANGIOPLASTY WITH XENOSURE BIOLOGIC 1 cm x 6 cm PATCH;  Surgeon: Broadus John, MD;  Location: [place];  Service: Vascular;  Laterality: Left;  ? SPINE SURGERY    ? TEE  WITHOUT CARDIOVERSION N/A 08/07/2021  ? Procedure: TRANSESOPHAGEAL ECHOCARDIOGRAM (TEE);  Surgeon: Janina Mayo, MD;  Location: [place];  Service: Cardiovascular;  Laterality: N/A;  ? ? ?Current Medications: ?Current Meds  ?Medication Sig  ? ACCU-CHEK SOFTCLIX LANCETS lancets   ? acetaminophen (TYLENOL) 650 MG CR tablet Take 1,300 mg by mouth every 8 (eight) hours as needed for pain.  ? amiodarone (PACERONE) 200 MG tablet Take 1 tablet (200 mg total) by mouth 2 (two) times daily for 10 days, THEN 1 tablet (200 mg total) daily.  ? atorvastatin (LIPITOR) 40 MG tablet Take 1 tablet (40 mg total) by mouth daily.  ? diclofenac Sodium (VOLTAREN) 1 % GEL Apply 2 g topically 2 (two) times daily as needed (pain).  ? diltiazem (CARDIZEM CD) 240 MG 24 hr capsule Take 1 capsule (240 mg total) by mouth daily.  ? ELIQUIS 2.5 MG TABS tablet TAKE 1 TABLET BY MOUTH TWICE A DAY  ? ezetimibe (ZETIA) 10 MG tablet Take 1 tablet (10 mg total) by mouth daily.  ? Glucerna (GLUCERNA) LIQD Take 237 mLs by mouth 3 (three) times daily between meals.  ? insulin aspart (NOVOLOG) 100 UNIT/ML injection Inject 0-9 Units into the skin 3 (three) times daily with meals.  ? LANTUS SOLOSTAR 100 UNIT/ML Solostar Pen Inject 15 Units into the skin at bedtime.  ? latanoprost (XALATAN) 0.005 % ophthalmic solution Place 1 drop into both eyes daily.  ? lisinopril (ZESTRIL) 20 MG tablet Take 1 tablet (20 mg total) by mouth daily.  ? metFORMIN (GLUCOPHAGE) 500 MG tablet Take 1,000 mg by mouth 2 (two) times daily.  ? metoprolol tartrate (LOPRESSOR) 50 MG tablet Take 1 tablet po twice daily  ? Multiple Vitamin (MULTIVITAMIN) tablet Take 1 tablet by mouth daily. Centrum silver  ? mupirocin ointment (BACTROBAN) 2 % Place 1 application. into the nose 2 (two) times daily.  ? Omega-3 Fatty Acids (FISH OIL) 1200 MG CAPS Take 1,200 mg by mouth daily.  ? pantoprazole (PROTONIX) 40 MG tablet Take 1 tablet (40 mg total) by mouth daily.  ?  ? ?Allergies:   Patient has  no known allergies.  ? ?Social History  ? ?Socioeconomic History  ? Marital status: Widowed  ?  Spouse name: Not on file  ? Number of children: 1  ? Years of education: Not on file  ? Highest education level: Not on file  ?Occupational History  ? Occupation: Retired  ?  Comment: retail Financial controller  ?Tobacco Use  ? Smoking status: Never  ? Smokeless tobacco: Never  ?Vaping Use  ? Vaping Use: Never used  ?Substance and Sexual Activity  ? Alcohol use: No  ? Drug use: No  ? Sexual activity: Not on file  ?Other Topics Concern  ? Not on file  ?Social History Narrative  ? Lives alone. Has daily contact with friends. Transportation by friends and sometimes [employer].  ? ?Social Determinants of Health  ? ?  Financial Resource Strain: Not on file  ?Food Insecurity: No Food Insecurity  ? Worried About Charity fundraiser in the Last Year: Never true  ? Ran Out of Food in the Last Year: Never true  ?Transportation Needs: No Transportation Needs  ? Lack of Transportation (Medical): No  ? Lack of Transportation (Non-Medical): No  ?Physical Activity: Not on file  ?Stress: Not on file  ?Social Connections: Not on file  ?  ? ?Family History: ?The patient's family history includes Alzheimer's disease in her sister and sister; Dementia in her sister; Diabetes in her brother, brother, father, sister, and sister; Heart attack in her brother, mother, and sister; Heart disease in her brother and sister; Hypertension in her father and mother; Stroke in her father and sister. ? ?ROS:   ?Please see the history of present illness.    ? All other systems reviewed and are negative. ? ?EKGs/Labs/Other Studies Reviewed:   ? ?The following studies were reviewed today: ? ?TEE 08/07/2021 ? 1. Left ventricular ejection fraction, by estimation, is 50 to 55%. The  ?left ventricle has low normal function.  ? 2. Right ventricular systolic function is low normal. The right  ?ventricular size is normal.  ? 3. No left atrial/left atrial  appendage thrombus was detected. The LAA  ?emptying velocity was 30 cm/s.  ? 4. The mitral valve is normal in structure. No evidence of mitral valve  ?regurgitation.  ? 5. The aortic valve is normal in structu

## 2021-08-16 DIAGNOSIS — L98419 Non-pressure chronic ulcer of buttock with unspecified severity: Secondary | ICD-10-CM | POA: Diagnosis not present

## 2021-08-17 ENCOUNTER — Encounter: Payer: Self-pay | Admitting: Physician Assistant

## 2021-08-21 DIAGNOSIS — I4891 Unspecified atrial fibrillation: Secondary | ICD-10-CM | POA: Diagnosis not present

## 2021-08-21 DIAGNOSIS — I10 Essential (primary) hypertension: Secondary | ICD-10-CM | POA: Diagnosis not present

## 2021-08-21 DIAGNOSIS — F01518 Vascular dementia, unspecified severity, with other behavioral disturbance: Secondary | ICD-10-CM | POA: Diagnosis not present

## 2021-08-21 DIAGNOSIS — K219 Gastro-esophageal reflux disease without esophagitis: Secondary | ICD-10-CM | POA: Diagnosis not present

## 2021-08-22 ENCOUNTER — Other Ambulatory Visit: Payer: Self-pay | Admitting: *Deleted

## 2021-08-22 DIAGNOSIS — I10 Essential (primary) hypertension: Secondary | ICD-10-CM | POA: Diagnosis not present

## 2021-08-22 DIAGNOSIS — K219 Gastro-esophageal reflux disease without esophagitis: Secondary | ICD-10-CM | POA: Diagnosis not present

## 2021-08-22 DIAGNOSIS — F01518 Vascular dementia, unspecified severity, with other behavioral disturbance: Secondary | ICD-10-CM | POA: Diagnosis not present

## 2021-08-22 DIAGNOSIS — E119 Type 2 diabetes mellitus without complications: Secondary | ICD-10-CM | POA: Diagnosis not present

## 2021-08-22 DIAGNOSIS — I779 Disorder of arteries and arterioles, unspecified: Secondary | ICD-10-CM

## 2021-08-22 DIAGNOSIS — I4891 Unspecified atrial fibrillation: Secondary | ICD-10-CM | POA: Diagnosis not present

## 2021-08-23 DIAGNOSIS — L98419 Non-pressure chronic ulcer of buttock with unspecified severity: Secondary | ICD-10-CM | POA: Diagnosis not present

## 2021-08-27 ENCOUNTER — Ambulatory Visit: Payer: Self-pay

## 2021-08-28 DIAGNOSIS — F01518 Vascular dementia, unspecified severity, with other behavioral disturbance: Secondary | ICD-10-CM | POA: Diagnosis not present

## 2021-08-28 DIAGNOSIS — E119 Type 2 diabetes mellitus without complications: Secondary | ICD-10-CM | POA: Diagnosis not present

## 2021-08-28 DIAGNOSIS — I10 Essential (primary) hypertension: Secondary | ICD-10-CM | POA: Diagnosis not present

## 2021-08-28 DIAGNOSIS — I4891 Unspecified atrial fibrillation: Secondary | ICD-10-CM | POA: Diagnosis not present

## 2021-08-28 DIAGNOSIS — K219 Gastro-esophageal reflux disease without esophagitis: Secondary | ICD-10-CM | POA: Diagnosis not present

## 2021-09-05 NOTE — Progress Notes (Signed)
[facility] [place] HISTORY AND PHYSICAL   History of Present Illness:  Patient is a 88 y.o. year old female who presents for evaluation of PAD with history of left above knee to below knee popliteal artery bypass graft on 07/23/10.  She developed rest pain in April 2023 and under went Angiogram.   Most recent angiogram demonstrated near-occlusion of the left common femoral artery, with profunda only runoff, and reconstitution of the posterior tibial artery distally.  This vessel was very small.  She then underwent Left common femoral endarterectomy.  She is here today for exam and f/u studies with new baseline  left LE inflow.    She denies rest pain, non healing ulcer or claudication at short distances.  She is still residing at a SNF for rehab.  She is medically managed on Eliquis and Lipitor   Past Medical History:  Diagnosis Date   Arthritis    Atrial fibrillation ([place])    Diabetes mellitus    DVT (deep venous thrombosis) ([facility])    Hyperlipidemia    Hypertension    Leg pain    Peripheral arterial disease ([place])    Stroke ([place]) 1999    Past Surgical History:  Procedure Laterality Date   ABDOMINAL AORTOGRAM W/LOWER EXTREMITY N/A 07/11/2021   Procedure: ABDOMINAL AORTOGRAM W/LOWER EXTREMITY;  Surgeon: Broadus John, MD;  Location: [place] [facility];  Service: Cardiovascular;  Laterality: N/A;   [place] N/A 02/15/2020   Procedure: CARDIOVERSION;  Surgeon: Acie Fredrickson, Wonda Cheng, MD;  Location: [facility] ENDOSCOPY;  Service: Cardiovascular;  Laterality: N/A;   CARDIOVERSION N/A 08/07/2021   Procedure: CARDIOVERSION;  Surgeon: Janina Mayo, MD;  Location: [facility] ENDOSCOPY;  Service: Cardiovascular;  Laterality: N/A;   CATARACT EXTRACTION  2000   CESAREAN SECTION     ENDARTERECTOMY FEMORAL Left 07/26/2021   Procedure: LEFT COMMON FEMORAL ENDARTERECTOMY;  Surgeon: Broadus John, MD;  Location: [place];  Service: Vascular;  Laterality: Left;   EYE SURGERY      FEMORAL BYPASS Left July 23, 2010   LUMBAR DISC SURGERY     PATCH ANGIOPLASTY Left 07/26/2021   Procedure: PATCH ANGIOPLASTY WITH XENOSURE BIOLOGIC 1 cm x 6 cm PATCH;  Surgeon: Broadus John, MD;  Location: [place];  Service: Vascular;  Laterality: Left;   SPINE SURGERY     TEE WITHOUT CARDIOVERSION N/A 08/07/2021   Procedure: TRANSESOPHAGEAL ECHOCARDIOGRAM (TEE);  Surgeon: Janina Mayo, MD;  Location[facility] ENDOSCOPY;  Service: Cardiovascular;  Laterality: N/A;    ROS:   General:  No weight loss, Fever, chills  HEENT: No recent headaches, no nasal bleeding, no visual changes, no sore throat  Neurologic: No dizziness, blackouts, seizures. No recent symptoms of stroke or mini- stroke. No recent episodes of slurred speech, or temporary blindness.  Cardiac: No recent episodes of chest pain/pressure, no shortness of breath at rest.  No shortness of breath with exertion.  Denies history of atrial fibrillation or irregular heartbeat  Vascular: No history of rest pain in feet.  No history of claudication.  No history of non-healing ulcer, No history of DVT   Pulmonary: No home oxygen, no productive cough, no hemoptysis,  No asthma or wheezing  Musculoskeletal:  [ ]  Arthritis, [ ]  Low back pain,  [ ]  Joint pain  Hematologic:No history of hypercoagulable state.  No history of easy bleeding.  No history of anemia  Gastrointestinal: No hematochezia or melena,  No gastroesophageal reflux, no  trouble swallowing  Urinary: [ ]  chronic Kidney disease, [ ]  on HD - [ ]  MWF or [ ]  TTHS, [ ]  Burning with urination, [ ]  Frequent urination, [ ]  Difficulty urinating;   Skin: No rashes  Psychological: No history of anxiety,  No history of depression  Social History Social History   Tobacco Use   Smoking status: Never   Smokeless tobacco: Never  Vaping Use   Vaping Use: Never used  Substance Use Topics   Alcohol use: No   Drug use: No    Family History Family History  Problem Relation Age  of Onset   Hypertension Mother    Heart attack Mother    Diabetes Father    Hypertension Father    Stroke Father    Stroke Sister    Diabetes Sister    Heart disease Sister        Before age 74-Aneurysm   Dementia Sister    Alzheimer's disease Sister    Diabetes Brother    Diabetes Brother    Heart disease Brother        Before age 40   Heart attack Brother    Heart attack Sister    Diabetes Sister    Alzheimer's disease Sister     Allergies  No Known Allergies   Current Outpatient Medications  Medication Sig Dispense Refill   ACCU-CHEK SOFTCLIX LANCETS lancets      acetaminophen (TYLENOL) 650 MG CR tablet Take 1,300 mg by mouth every 8 (eight) hours as needed for pain.     amiodarone (PACERONE) 200 MG tablet Take 1 tablet (200 mg total) by mouth 2 (two) times daily for 10 days, THEN 1 tablet (200 mg total) daily. 50 tablet 0   diclofenac Sodium (VOLTAREN) 1 % GEL Apply 2 g topically 2 (two) times daily as needed (pain).     diltiazem (CARDIZEM CD) 240 MG 24 hr capsule Take 1 capsule (240 mg total) by mouth daily. 90 capsule 0   docusate sodium (COLACE) 100 MG capsule Take 1 capsule (100 mg total) by mouth daily. 10 capsule 0   ELIQUIS 2.5 MG TABS tablet TAKE 1 TABLET BY MOUTH TWICE A DAY 180 tablet 1   ezetimibe (ZETIA) 10 MG tablet Take 1 tablet (10 mg total) by mouth daily. 90 tablet 0   Glucerna (GLUCERNA) LIQD Take 237 mLs by mouth 3 (three) times daily between meals.  0   insulin aspart (NOVOLOG) 100 UNIT/ML injection Inject 0-9 Units into the skin 3 (three) times daily with meals. 10 mL 11   LANTUS SOLOSTAR 100 UNIT/ML Solostar Pen Inject 15 Units into the skin at bedtime. 15 mL 11   latanoprost (XALATAN) 0.005 % ophthalmic solution Place 1 drop into both eyes daily.     lisinopril (ZESTRIL) 20 MG tablet Take 1 tablet (20 mg total) by mouth daily. 90 tablet 3   metFORMIN (GLUCOPHAGE) 500 MG tablet Take 1,000 mg by mouth 2 (two) times daily.     metoprolol tartrate  (LOPRESSOR) 50 MG tablet Take 1 tablet po twice daily 14 tablet 0   Multiple Vitamin (MULTIVITAMIN) tablet Take 1 tablet by mouth daily. Centrum silver     mupirocin ointment (BACTROBAN) 2 % Place 1 application. into the nose 2 (two) times daily. 22 g 0   Omega-3 Fatty Acids (FISH OIL) 1200 MG CAPS Take 1,200 mg by mouth daily.     pantoprazole (PROTONIX) 40 MG tablet Take 1 tablet (40 mg total) by mouth daily.  30 tablet 11   atorvastatin (LIPITOR) 40 MG tablet Take 1 tablet (40 mg total) by mouth daily. 90 tablet 3   No current facility-administered medications for this visit.    Physical Examination  Vitals:   09/07/21 0959  BP: (!) 165/69  Pulse: 67  Resp: 14  Temp: (!) 97.1 F (36.2 C)  TempSrc: Temporal  SpO2: 99%  Weight: 82 lb (37.2 kg)  Height: 5' (1.524 m)    Body mass index is 16.01 kg/m.  General:  Alert and oriented, no acute distress HEENT: Normal Neck: No bruit or JVD Pulmonary: Clear to auscultation bilaterally Cardiac: Regular Rate and Rhythm without murmur Abdomen: Soft, non-tender, non-distended, no mass, no scars Skin: No rash Extremity Pulses:  2+ radial,  femoral, right dorsalis pedis palpable pulse Musculoskeletal: No deformity or edema  Neurologic: Upper and lower extremity motor grossly intact and symmetric  DATA:  ABI Findings:  +---------+------------------+-----+----------+--------+  Right    Rt Pressure (mmHg)IndexWaveform  Comment   +---------+------------------+-----+----------+--------+  Brachial 168                                        +---------+------------------+-----+----------+--------+  ATA      255               1.52                     +---------+------------------+-----+----------+--------+  PTA      255               1.52 monophasic          +---------+------------------+-----+----------+--------+  DP                              triphasic            +---------+------------------+-----+----------+--------+  Great Toe92                0.55                     +---------+------------------+-----+----------+--------+   +---------+------------------+-----+----------+-------+  Left     Lt Pressure (mmHg)IndexWaveform  Comment  +---------+------------------+-----+----------+-------+  Brachial 167                                       +---------+------------------+-----+----------+-------+  ATA      255               1.52                    +---------+------------------+-----+----------+-------+  PTA      255               1.52 biphasic           +---------+------------------+-----+----------+-------+  DP                              monophasic         +---------+------------------+-----+----------+-------+  Great Toe34                0.20                    +---------+------------------+-----+----------+-------+   +-------+----------------+-----------+----------------+------------+  ABI/TBIToday's ABI  Today's TBIPrevious ABI    Previous TBI  +-------+----------------+-----------+----------------+------------+  Right  Non compressible0.55       Non compressible0.40          +-------+----------------+-----------+----------------+------------+  Left   Non compressible0.20       Non compressible0.17          +-------+----------------+-----------+----------------+------------+   Bilateral ABIs appear essentially unchanged compared to prior study on  06/28/2021.   ASSESSMENT/PLAN:  PAD s/p Left common femoral endarterectomy for  critical limb ischemia in the left leg with rest pain.  Most recent angiogram demonstrated near-occlusion of the left common femoral artery, with profunda only runoff, and reconstitution of the posterior tibial artery distally.   She has biphasic/triphasic wave forms B with calcifies vessels.  She denies rest pain , new non healing wounds or short  distance claudication.     I will schedule her to f/u with repeat studies in 6 months.  If she develops symptoms of ischemia she will call sooner than later.     Roxy Horseman PA-C [facility] [phone]  MD in clinic [place]

## 2021-09-06 DIAGNOSIS — I4891 Unspecified atrial fibrillation: Secondary | ICD-10-CM | POA: Diagnosis not present

## 2021-09-06 DIAGNOSIS — I10 Essential (primary) hypertension: Secondary | ICD-10-CM | POA: Diagnosis not present

## 2021-09-06 DIAGNOSIS — K219 Gastro-esophageal reflux disease without esophagitis: Secondary | ICD-10-CM | POA: Diagnosis not present

## 2021-09-06 DIAGNOSIS — F01518 Vascular dementia, unspecified severity, with other behavioral disturbance: Secondary | ICD-10-CM | POA: Diagnosis not present

## 2021-09-07 ENCOUNTER — Ambulatory Visit (INDEPENDENT_AMBULATORY_CARE_PROVIDER_SITE_OTHER): Payer: Medicare HMO | Admitting: Physician Assistant

## 2021-09-07 ENCOUNTER — Encounter: Payer: Self-pay | Admitting: Physician Assistant

## 2021-09-07 ENCOUNTER — Ambulatory Visit (HOSPITAL_COMMUNITY)
Admission: RE | Admit: 2021-09-07 | Discharge: 2021-09-07 | Disposition: A | Discharge status: Home / Self Care | Payer: Medicare HMO | Source: Ambulatory Visit | Attending: Vascular Surgery | Admitting: Vascular Surgery

## 2021-09-07 VITALS — BP 165/69 | HR 67 | Temp 97.1°F | Resp 14 | Ht 60.0 in | Wt 82.0 lb

## 2021-09-07 DIAGNOSIS — I779 Disorder of arteries and arterioles, unspecified: Secondary | ICD-10-CM

## 2021-09-12 DIAGNOSIS — I10 Essential (primary) hypertension: Secondary | ICD-10-CM | POA: Diagnosis not present

## 2021-09-12 DIAGNOSIS — E7849 Other hyperlipidemia: Secondary | ICD-10-CM | POA: Diagnosis not present

## 2021-09-14 ENCOUNTER — Other Ambulatory Visit: Payer: Self-pay | Admitting: *Deleted

## 2021-09-14 DIAGNOSIS — I480 Paroxysmal atrial fibrillation: Secondary | ICD-10-CM | POA: Diagnosis not present

## 2021-09-14 DIAGNOSIS — I779 Disorder of arteries and arterioles, unspecified: Secondary | ICD-10-CM

## 2021-09-14 DIAGNOSIS — I10 Essential (primary) hypertension: Secondary | ICD-10-CM | POA: Diagnosis not present

## 2021-09-14 DIAGNOSIS — I70222 Atherosclerosis of native arteries of extremities with rest pain, left leg: Secondary | ICD-10-CM

## 2021-09-14 DIAGNOSIS — I70209 Unspecified atherosclerosis of native arteries of extremities, unspecified extremity: Secondary | ICD-10-CM | POA: Diagnosis not present

## 2021-09-14 DIAGNOSIS — I739 Peripheral vascular disease, unspecified: Secondary | ICD-10-CM | POA: Diagnosis not present

## 2021-09-17 DIAGNOSIS — R293 Abnormal posture: Secondary | ICD-10-CM | POA: Diagnosis not present

## 2021-09-17 DIAGNOSIS — I4891 Unspecified atrial fibrillation: Secondary | ICD-10-CM | POA: Diagnosis not present

## 2021-09-17 DIAGNOSIS — R278 Other lack of coordination: Secondary | ICD-10-CM | POA: Diagnosis not present

## 2021-09-17 DIAGNOSIS — I10 Essential (primary) hypertension: Secondary | ICD-10-CM | POA: Diagnosis not present

## 2021-09-17 DIAGNOSIS — K219 Gastro-esophageal reflux disease without esophagitis: Secondary | ICD-10-CM | POA: Diagnosis not present

## 2021-09-17 DIAGNOSIS — M1990 Unspecified osteoarthritis, unspecified site: Secondary | ICD-10-CM | POA: Diagnosis not present

## 2021-09-17 DIAGNOSIS — I480 Paroxysmal atrial fibrillation: Secondary | ICD-10-CM | POA: Diagnosis not present

## 2021-09-17 DIAGNOSIS — M6281 Muscle weakness (generalized): Secondary | ICD-10-CM | POA: Diagnosis not present

## 2021-09-17 DIAGNOSIS — R2681 Unsteadiness on feet: Secondary | ICD-10-CM | POA: Diagnosis not present

## 2021-09-17 DIAGNOSIS — R1311 Dysphagia, oral phase: Secondary | ICD-10-CM | POA: Diagnosis not present

## 2021-09-17 DIAGNOSIS — F01518 Vascular dementia, unspecified severity, with other behavioral disturbance: Secondary | ICD-10-CM | POA: Diagnosis not present

## 2021-09-17 DIAGNOSIS — R262 Difficulty in walking, not elsewhere classified: Secondary | ICD-10-CM | POA: Diagnosis not present

## 2021-09-17 DIAGNOSIS — M62262 Nontraumatic ischemic infarction of muscle, left lower leg: Secondary | ICD-10-CM | POA: Diagnosis not present

## 2021-09-18 DIAGNOSIS — I480 Paroxysmal atrial fibrillation: Secondary | ICD-10-CM | POA: Diagnosis not present

## 2021-09-18 DIAGNOSIS — R278 Other lack of coordination: Secondary | ICD-10-CM | POA: Diagnosis not present

## 2021-09-18 DIAGNOSIS — M62262 Nontraumatic ischemic infarction of muscle, left lower leg: Secondary | ICD-10-CM | POA: Diagnosis not present

## 2021-09-18 DIAGNOSIS — R1311 Dysphagia, oral phase: Secondary | ICD-10-CM | POA: Diagnosis not present

## 2021-09-18 DIAGNOSIS — M6281 Muscle weakness (generalized): Secondary | ICD-10-CM | POA: Diagnosis not present

## 2021-09-18 DIAGNOSIS — I10 Essential (primary) hypertension: Secondary | ICD-10-CM | POA: Diagnosis not present

## 2021-09-18 DIAGNOSIS — R262 Difficulty in walking, not elsewhere classified: Secondary | ICD-10-CM | POA: Diagnosis not present

## 2021-09-18 DIAGNOSIS — R2681 Unsteadiness on feet: Secondary | ICD-10-CM | POA: Diagnosis not present

## 2021-09-18 DIAGNOSIS — R293 Abnormal posture: Secondary | ICD-10-CM | POA: Diagnosis not present

## 2021-09-26 DIAGNOSIS — E1169 Type 2 diabetes mellitus with other specified complication: Secondary | ICD-10-CM | POA: Diagnosis not present

## 2021-09-26 DIAGNOSIS — E7849 Other hyperlipidemia: Secondary | ICD-10-CM | POA: Diagnosis not present

## 2021-09-26 DIAGNOSIS — I4891 Unspecified atrial fibrillation: Secondary | ICD-10-CM | POA: Diagnosis not present

## 2021-09-26 DIAGNOSIS — I10 Essential (primary) hypertension: Secondary | ICD-10-CM | POA: Diagnosis not present

## 2021-09-26 DIAGNOSIS — I739 Peripheral vascular disease, unspecified: Secondary | ICD-10-CM | POA: Diagnosis not present

## 2021-10-12 DIAGNOSIS — E7849 Other hyperlipidemia: Secondary | ICD-10-CM | POA: Diagnosis not present

## 2021-10-12 DIAGNOSIS — I10 Essential (primary) hypertension: Secondary | ICD-10-CM | POA: Diagnosis not present

## 2021-10-17 DIAGNOSIS — I10 Essential (primary) hypertension: Secondary | ICD-10-CM | POA: Diagnosis not present

## 2021-10-17 DIAGNOSIS — E1169 Type 2 diabetes mellitus with other specified complication: Secondary | ICD-10-CM | POA: Diagnosis not present

## 2021-10-17 DIAGNOSIS — E781 Pure hyperglyceridemia: Secondary | ICD-10-CM | POA: Diagnosis not present

## 2021-10-24 DIAGNOSIS — L603 Nail dystrophy: Secondary | ICD-10-CM | POA: Diagnosis not present

## 2021-10-24 DIAGNOSIS — I739 Peripheral vascular disease, unspecified: Secondary | ICD-10-CM | POA: Diagnosis not present

## 2021-10-24 DIAGNOSIS — E1151 Type 2 diabetes mellitus with diabetic peripheral angiopathy without gangrene: Secondary | ICD-10-CM | POA: Diagnosis not present

## 2021-10-24 DIAGNOSIS — L84 Corns and callosities: Secondary | ICD-10-CM | POA: Diagnosis not present

## 2021-11-28 ENCOUNTER — Telehealth: Payer: Self-pay | Admitting: Cardiology

## 2021-11-28 MED ORDER — AMIODARONE HCL 200 MG PO TABS: ORAL_TABLET | ORAL | 3 refills | Status: DC

## 2021-11-28 NOTE — Telephone Encounter (Signed)
*  STAT* If patient is at the pharmacy, call can be transferred to refill team.   1. Which medications need to be refilled? (please list name of each medication and dose if known)   amiodarone (PACERONE) 200 MG tablet   2. Which pharmacy/location (including street and city if local pharmacy) is medication to be sent to?  [id number] - [place], [place] - [address]  3. Do they need a 30 day or 90 day supply?  90 day  Daughter stated patient is completely out of this medication.

## 2021-11-30 DIAGNOSIS — E1169 Type 2 diabetes mellitus with other specified complication: Secondary | ICD-10-CM | POA: Diagnosis not present

## 2021-11-30 DIAGNOSIS — M130 Polyarthritis, unspecified: Secondary | ICD-10-CM | POA: Diagnosis not present

## 2021-11-30 DIAGNOSIS — F01B4 Vascular dementia, moderate, with anxiety: Secondary | ICD-10-CM | POA: Diagnosis not present

## 2021-11-30 DIAGNOSIS — R634 Abnormal weight loss: Secondary | ICD-10-CM | POA: Diagnosis not present

## 2021-11-30 DIAGNOSIS — I119 Hypertensive heart disease without heart failure: Secondary | ICD-10-CM | POA: Diagnosis not present

## 2021-11-30 DIAGNOSIS — I6789 Other cerebrovascular disease: Secondary | ICD-10-CM | POA: Diagnosis not present

## 2021-11-30 DIAGNOSIS — Z7901 Long term (current) use of anticoagulants: Secondary | ICD-10-CM | POA: Diagnosis not present

## 2021-11-30 DIAGNOSIS — I4891 Unspecified atrial fibrillation: Secondary | ICD-10-CM | POA: Diagnosis not present

## 2021-12-10 NOTE — Progress Notes (Deleted)
HPI: FU paroxysmal atrial fibrillation, atrial flutter and prior CVA. She had a Myoview performed on Aug 27, 2005, that showed an ejection fraction of 80% and normal perfusion. Echocardiogram November 2021 showed ejection fraction 50 to 55%, mild RV dysfunction.  Had cardioversion November 2021.  Transesophageal echocardiogram April 2023 showed ejection fraction 50 to 55%, no left atrial appendage thrombus; underwent successful cardioversion.  Patient also with peripheral vascular disease followed by vascular surgery.  Arteriogram March 2023 showed greater than 80% stenosis of the common femoral artery on the left, multiple tandem flow-limiting stenoses in the superficial femoral artery and occlusion of the P1 segment of the popliteal artery; not amenable to endovascular intervention and medical therapy recommended.  Since I last saw her,   Current Outpatient Medications  Medication Sig Dispense Refill   ACCU-CHEK SOFTCLIX LANCETS lancets      acetaminophen (TYLENOL) 650 MG CR tablet Take 1,300 mg by mouth every 8 (eight) hours as needed for pain.     amiodarone (PACERONE) 200 MG tablet Take 1 tablet (200 mg total) by mouth 2 (two) times daily for 10 days, THEN 1 tablet (200 mg total) daily. 90 tablet 3   atorvastatin (LIPITOR) 40 MG tablet Take 1 tablet (40 mg total) by mouth daily. 90 tablet 3   diclofenac Sodium (VOLTAREN) 1 % GEL Apply 2 g topically 2 (two) times daily as needed (pain).     diltiazem (CARDIZEM CD) 240 MG 24 hr capsule Take 1 capsule (240 mg total) by mouth daily. 90 capsule 0   docusate sodium (COLACE) 100 MG capsule Take 1 capsule (100 mg total) by mouth daily. 10 capsule 0   ELIQUIS 2.5 MG TABS tablet TAKE 1 TABLET BY MOUTH TWICE A DAY 180 tablet 1   ezetimibe (ZETIA) 10 MG tablet Take 1 tablet (10 mg total) by mouth daily. 90 tablet 0   Glucerna (GLUCERNA) LIQD Take 237 mLs by mouth 3 (three) times daily between meals.  0   insulin aspart (NOVOLOG) 100 UNIT/ML injection  Inject 0-9 Units into the skin 3 (three) times daily with meals. 10 mL 11   LANTUS SOLOSTAR 100 UNIT/ML Solostar Pen Inject 15 Units into the skin at bedtime. 15 mL 11   latanoprost (XALATAN) 0.005 % ophthalmic solution Place 1 drop into both eyes daily.     lisinopril (ZESTRIL) 20 MG tablet Take 1 tablet (20 mg total) by mouth daily. 90 tablet 3   metFORMIN (GLUCOPHAGE) 500 MG tablet Take 1,000 mg by mouth 2 (two) times daily.     metoprolol tartrate (LOPRESSOR) 50 MG tablet Take 1 tablet po twice daily 14 tablet 0   Multiple Vitamin (MULTIVITAMIN) tablet Take 1 tablet by mouth daily. Centrum silver     mupirocin ointment (BACTROBAN) 2 % Place 1 application. into the nose 2 (two) times daily. 22 g 0   Omega-3 Fatty Acids (FISH OIL) 1200 MG CAPS Take 1,200 mg by mouth daily.     pantoprazole (PROTONIX) 40 MG tablet Take 1 tablet (40 mg total) by mouth daily. 30 tablet 11   No current facility-administered medications for this visit.     Past Medical History:  Diagnosis Date   Arthritis    Atrial fibrillation (HCC)    Diabetes mellitus    DVT (deep venous thrombosis) (HCC)    Hyperlipidemia    Hypertension    Leg pain    Peripheral arterial disease (HCC)    Stroke (HCC) 1999    Past Surgical  History:  Procedure Laterality Date   ABDOMINAL AORTOGRAM W/LOWER EXTREMITY N/A 07/11/2021   Procedure: ABDOMINAL AORTOGRAM W/LOWER EXTREMITY;  Surgeon: Victorino Sparrow, MD;  Location: [facility] INVASIVE CV LAB;  Service: Cardiovascular;  Laterality: N/A;   BACK SURGERY  1996   CARDIOVERSION N/A 02/15/2020   Procedure: CARDIOVERSION;  Surgeon: Elease Hashimoto, Deloris Ping, MD;  Location: [facility] ENDOSCOPY;  Service: Cardiovascular;  Laterality: N/A;   CARDIOVERSION N/A 08/07/2021   Procedure: CARDIOVERSION;  Surgeon: Maisie Fus, MD;  Location: [facility] ENDOSCOPY;  Service: Cardiovascular;  Laterality: N/A;   CATARACT EXTRACTION  2000   CESAREAN SECTION     ENDARTERECTOMY FEMORAL Left 07/26/2021   Procedure: LEFT COMMON  FEMORAL ENDARTERECTOMY;  Surgeon: Victorino Sparrow, MD;  Location: [facility] OR;  Service: Vascular;  Laterality: Left;   EYE SURGERY     FEMORAL BYPASS Left July 23, 2010   LUMBAR DISC SURGERY     PATCH ANGIOPLASTY Left 07/26/2021   Procedure: PATCH ANGIOPLASTY WITH XENOSURE BIOLOGIC 1 cm x 6 cm PATCH;  Surgeon: Victorino Sparrow, MD;  Location: [facility] OR;  Service: Vascular;  Laterality: Left;   SPINE SURGERY     TEE WITHOUT CARDIOVERSION N/A 08/07/2021   Procedure: TRANSESOPHAGEAL ECHOCARDIOGRAM (TEE);  Surgeon: Maisie Fus, MD;  Location: [facility] ENDOSCOPY;  Service: Cardiovascular;  Laterality: N/A;    Social History   Socioeconomic History   Marital status: Widowed    Spouse name: Not on file   Number of children: 1   Years of education: Not on file   Highest education level: Not on file  Occupational History   Occupation: Retired    Comment: Teacher, adult education  Tobacco Use   Smoking status: Never   Smokeless tobacco: Never  Vaping Use   Vaping Use: Never used  Substance and Sexual Activity   Alcohol use: No   Drug use: No   Sexual activity: Not on file  Other Topics Concern   Not on file  Social History Narrative   Lives alone. Has daily contact with friends. Transportation by friends and sometimes [employer].   Social Determinants of Health   Financial Resource Strain: Low Risk  (01/31/2020)   Overall Financial Resource Strain (CARDIA)    Difficulty of Paying Living Expenses: Not hard at all  Food Insecurity: No Food Insecurity (12/01/2020)   Hunger Vital Sign    Worried About Running Out of Food in the Last Year: Never true    Ran Out of Food in the Last Year: Never true  Transportation Needs: No Transportation Needs (10/06/2020)   PRAPARE - Administrator, Civil Service (Medical): No    Lack of Transportation (Non-Medical): No  Physical Activity: Sufficiently Active (01/31/2020)   Exercise Vital Sign    Days of Exercise per Week: 7 days     Minutes of Exercise per Session: 120 min  Stress: No Stress Concern Present (08/30/2019)   [employer] of Occupational Health - Occupational Stress Questionnaire    Feeling of Stress : Not at all  Social Connections: Not on file  Intimate Partner Violence: Not on file    Family History  Problem Relation Age of Onset   Hypertension Mother    Heart attack Mother    Diabetes Father    Hypertension Father    Stroke Father    Stroke Sister    Diabetes Sister    Heart disease Sister        Before age 11-Aneurysm  Dementia Sister    Alzheimer's disease Sister    Diabetes Brother    Diabetes Brother    Heart disease Brother        Before age 34   Heart attack Brother    Heart attack Sister    Diabetes Sister    Alzheimer's disease Sister     ROS: no fevers or chills, productive cough, hemoptysis, dysphasia, odynophagia, melena, hematochezia, dysuria, hematuria, rash, seizure activity, orthopnea, PND, pedal edema, claudication. Remaining systems are negative.  Physical Exam: Well-developed well-nourished in no acute distress.  Skin is warm and dry.  HEENT is normal.  Neck is supple.  Chest is clear to auscultation with normal expansion.  Cardiovascular exam is regular rate and rhythm.  Abdominal exam nontender or distended. No masses palpated. Extremities show no edema. neuro grossly intact  ECG- personally reviewed  A/P  1 paroxysmal atrial fibrillation-patient remains in sinus rhythm status post TEE guided cardioversion.  Continue Cardizem, metoprolol and apixaban.  2 peripheral vascular disease-followed by vascular surgery.  Continue statin.  3 hypertension-blood pressure controlled.  Continue present medical regimen.  4 hyperlipidemia-continue statin.  Olga Millers, MD

## 2021-12-19 ENCOUNTER — Other Ambulatory Visit: Payer: Self-pay | Admitting: Cardiology

## 2021-12-21 ENCOUNTER — Ambulatory Visit: Payer: Medicare HMO | Admitting: Cardiology

## 2021-12-21 NOTE — Progress Notes (Unsigned)
Cardiology Office Note:    Date:  12/21/2021   ID:  ANTONAE ZBIKOWSKI, DOB 1932/11/28, MRN [id number]  PCP:  [name], MD  Cardiologist:  Olga Millers, MD  Electrophysiologist:  None   Referring MD: [name], MD   Chief Complaint: follow-up of atrial fibrillation/ flutter  History of Present Illness:    [name] is a 88 y.o. female with a history of paroxysmal atrial fibrillation/ flutter on Eliquis, PAD s/p left above knee to below knee popliteal artery bypass in 07/2010 and more recently endarterectomy of the left common femoral artery in 07/2021, hypertension, hyperlipidemia, type 2 diabetes mellitus, DVT, and CVA who is followed by Dr. Jens Som and presents today for routine follow-up.   Patient is primarily followed by Dr. Jens Som for atrial fibrillation with prior cardioversion in 02/2020. Remote Myoview in 08/2005 showed no ischemia. Last TTE in 02/2020 showed LVEF of 50-55% with normal wall motion and mildly reduced RV systolic function. Patient also has known PAD for which she follows with Vascular Surgery. She underwent lower extremity angiography by Dr. Sherral Hammers in 07/2021 due to critical limb ischemia which showed occlusion of the left common femoral artery with only profunda runoff with reconstitution of the posterior tibial artery distally. She ultimately underwent left common femoral artery endarterectomy. Unfortunately, right after surgery, she had left facial swelling and asymmetric facial smile. Code stroke was called but brain MRI was negative. Hospitalization was complicated by recurrent atrial fibrillation with RVR and altered mental status. She was started on Amiodarone and then underwent TEE/DCCV.. Of note, TEE showed LVEF of 50-55% with no significant valvular disease.  Patient was last seen by Azalee Course, PA-C, for follow-up in 08/2021 at which time she was maintaining sinus rhythm. Her mental status had returned back to baseline and she was doing well from a  cardiac standpoint. LFTs and TSH were stable on Amiodarone.  Patient presents today for follow-up. ***  Paroxysmal Atrial Fibrillation S/p multiple DCCVs in the past, most recently in 07/2021. TEE in 07/2021 showed LVEF of 50-55%. - Maintaining sinus rhythm on exam.  - Continue Amiodarone 200mg  daily. - Continue Lopressor 50mg  twice daily and Cardizem CD 240mg  daily. - Continue chronic anticoagulation with Eliquis 2.5mg  twice daily (reduced dosing due to age and weight).  Hypertension BP well controlled. - Continue current medications: Cardizem CD 240mg  daily, Lopressor 50mg  twice daily, Lisinopril 20mg  daily.  Hyperlipidemia Most recent lipid panel in ***: - Continue Lipitor 40mg  daily and Zetia 10mg  daily.  Type 2 Diabetes Mellitus Hemoglobin A1c 7.3 in 07/2021. - On Metformin and Insulin. - Management per primary team.  PAD Long history of PAD with remote left above knee to below knee popliteal artery bypass in 07/2010. Most recently underwent endarterectomy of left common femoral artery in 07/2021 in setting of critical limb ischemia. Last ABIs in 08/2021 showed non-compressible lower extremity arteries bilaterally. - Continue Eliquis. - Continue statin/ Zetia. - Followed by Vascular Surgery (Dr. ).   Past Medical History:  Diagnosis Date   Arthritis    Atrial fibrillation (HCC)    Diabetes mellitus    DVT (deep venous thrombosis) (HCC)    Hyperlipidemia    Hypertension    Leg pain    Peripheral arterial disease (HCC)    Stroke (HCC) 1999    Past Surgical History:  Procedure Laterality Date   ABDOMINAL AORTOGRAM W/LOWER EXTREMITY N/A 07/11/2021   Procedure: ABDOMINAL AORTOGRAM W/LOWER EXTREMITY;  Surgeon: , MD;  Location: [facility] INVASIVE  CV LAB;  Service: Cardiovascular;  Laterality: N/A;   BACK SURGERY  1996   CARDIOVERSION N/A 02/15/2020   Procedure: CARDIOVERSION;  Surgeon: Elease Hashimoto, Deloris Ping, MD;  Location: [facility] ENDOSCOPY;  Service: Cardiovascular;   Laterality: N/A;   CARDIOVERSION N/A 08/07/2021   Procedure: CARDIOVERSION;  Surgeon: Maisie Fus, MD;  Location: [facility] ENDOSCOPY;  Service: Cardiovascular;  Laterality: N/A;   CATARACT EXTRACTION  2000   CESAREAN SECTION     ENDARTERECTOMY FEMORAL Left 07/26/2021   Procedure: LEFT COMMON FEMORAL ENDARTERECTOMY;  Surgeon: Victorino Sparrow, MD;  Location: [facility] OR;  Service: Vascular;  Laterality: Left;   EYE SURGERY     FEMORAL BYPASS Left July 23, 2010   LUMBAR DISC SURGERY     PATCH ANGIOPLASTY Left 07/26/2021   Procedure: PATCH ANGIOPLASTY WITH XENOSURE BIOLOGIC 1 cm x 6 cm PATCH;  Surgeon: Victorino Sparrow, MD;  Location: [facility] OR;  Service: Vascular;  Laterality: Left;   SPINE SURGERY     TEE WITHOUT CARDIOVERSION N/A 08/07/2021   Procedure: TRANSESOPHAGEAL ECHOCARDIOGRAM (TEE);  Surgeon: Maisie Fus, MD;  Location: [facility] ENDOSCOPY;  Service: Cardiovascular;  Laterality: N/A;    Current Medications: No outpatient medications have been marked as taking for the 12/26/21 encounter (Appointment) with Corrin Parker, PA-C.     Allergies:   Patient has no known allergies.   Social History   Socioeconomic History   Marital status: Widowed    Spouse name: Not on file   Number of children: 1   Years of education: Not on file   Highest education level: Not on file  Occupational History   Occupation: Retired    Comment: Teacher, adult education  Tobacco Use   Smoking status: Never   Smokeless tobacco: Never  Vaping Use   Vaping Use: Never used  Substance and Sexual Activity   Alcohol use: No   Drug use: No   Sexual activity: Not on file  Other Topics Concern   Not on file  Social History Narrative   Lives alone. Has daily contact with friends. Transportation by friends and sometimes [employer].   Social Determinants of Health   Financial Resource Strain: Low Risk  (01/31/2020)   Overall Financial Resource Strain (CARDIA)    Difficulty of Paying Living Expenses:  Not hard at all  Food Insecurity: No Food Insecurity (12/01/2020)   Hunger Vital Sign    Worried About Running Out of Food in the Last Year: Never true    Ran Out of Food in the Last Year: Never true  Transportation Needs: No Transportation Needs (10/06/2020)   PRAPARE - Administrator, Civil Service (Medical): No    Lack of Transportation (Non-Medical): No  Physical Activity: Sufficiently Active (01/31/2020)   Exercise Vital Sign    Days of Exercise per Week: 7 days    Minutes of Exercise per Session: 120 min  Stress: No Stress Concern Present (08/30/2019)   [employer] of Occupational Health - Occupational Stress Questionnaire    Feeling of Stress : Not at all  Social Connections: Not on file     Family History: The patient's family history includes Alzheimer's disease in her sister and sister; Dementia in her sister; Diabetes in her brother, brother, father, sister, and sister; Heart attack in her brother, mother, and sister; Heart disease in her brother and sister; Hypertension in her father and mother; Stroke in her father and sister.  ROS:   Please see the history of  present illness.     EKGs/Labs/Other Studies Reviewed:    The following studies were reviewed:  TTE 02/14/2020: Impressions: 1. Left ventricular ejection fraction, by estimation, is 50 to 55%. The  left ventricle has low normal function. The left ventricle has no regional  wall motion abnormalities. Indeterminate diastolic filling due to E-A  fusion.   2. Right ventricular systolic function is mildly reduced. The right  ventricular size is normal. There is normal pulmonary artery systolic  pressure.   3. The mitral valve is normal in structure. Trivial mitral valve  regurgitation.   4. The aortic valve was not well visualized. Aortic valve regurgitation  is not visualized.   Comparison(s): Compared to prior report in 2009, the patient is now in  Afib with RVR with LVEF 50-55%.   _______________  TEE 08/07/2021: Impressions:  1. Left ventricular ejection fraction, by estimation, is 50 to 55%. The  left ventricle has low normal function.   2. Right ventricular systolic function is low normal. The right  ventricular size is normal.   3. No left atrial/left atrial appendage thrombus was detected. The LAA  emptying velocity was 30 cm/s.   4. The mitral valve is normal in structure. No evidence of mitral valve  regurgitation.   5. The aortic valve is normal in structure. Aortic valve regurgitation is  not visualized.   6. There is mild (Grade II) plaque.   Conclusion(s)/Recommendation(s): No LA/LAA thrombus identified. Successful  cardioversion performed with restoration of normal sinus rhythm.  EKG:  EKG not ordered today.   Recent Labs: 08/07/2021: BUN 22; Creatinine, Ser 0.82; Hemoglobin 11.3; Magnesium 1.4; Platelets 383; Potassium 4.4; Sodium 136 08/15/2021: ALT 49; TSH 3.380  Recent Lipid Panel    Component Value Date/Time   CHOL 104 03/07/2021 0850   TRIG 48 03/07/2021 0850   HDL 43 03/07/2021 0850   CHOLHDL 2.4 03/07/2021 0850   CHOLHDL 2.8 02/16/2020 0513   VLDL 9 02/16/2020 0513   LDLCALC 49 03/07/2021 0850    Physical Exam:    Vital Signs: There were no vitals taken for this visit.    Wt Readings from Last 3 Encounters:  09/07/21 82 lb (37.2 kg)  08/15/21 85 lb 12.8 oz (38.9 kg)  07/31/21 83 lb 5.3 oz (37.8 kg)     General: 88 y.o. female in no acute distress. HEENT: Normocephalic and atraumatic. Sclera clear. EOMs intact. Neck: Supple. No carotid bruits. No JVD. Heart: *** RRR. Distinct S1 and S2. No murmurs, gallops, or rubs. Radial and distal pedal pulses 2+ and equal bilaterally. Lungs: No increased work of breathing. Clear to ausculation bilaterally. No wheezes, rhonchi, or rales.  Abdomen: Soft, non-distended, and non-tender to palpation. Bowel sounds present in all 4 quadrants.  MSK: Normal strength and tone for age.  *** Extremities: No lower extremity edema.    Skin: Warm and dry. Neuro: Alert and oriented x3. No focal deficits. Psych: Normal affect. Responds appropriately.   Assessment:    No diagnosis found.  Plan:     Disposition: Follow up in ***   Medication Adjustments/Labs and Tests Ordered: Current medicines are reviewed at length with the patient today.  Concerns regarding medicines are outlined above.  No orders of the defined types were placed in this encounter.  No orders of the defined types were placed in this encounter.   There are no Patient Instructions on file for this visit.   Signed, Corrin Parker, PA-C  12/21/2021 1:08 PM    [facility]

## 2021-12-26 ENCOUNTER — Encounter: Payer: Self-pay | Admitting: Student

## 2021-12-26 ENCOUNTER — Ambulatory Visit: Payer: Medicare HMO | Attending: Student | Admitting: Student

## 2021-12-26 VITALS — BP 150/80 | HR 68 | Ht 60.0 in | Wt 84.2 lb

## 2021-12-26 DIAGNOSIS — E785 Hyperlipidemia, unspecified: Secondary | ICD-10-CM

## 2021-12-26 DIAGNOSIS — I739 Peripheral vascular disease, unspecified: Secondary | ICD-10-CM

## 2021-12-26 DIAGNOSIS — Z794 Long term (current) use of insulin: Secondary | ICD-10-CM

## 2021-12-26 DIAGNOSIS — E118 Type 2 diabetes mellitus with unspecified complications: Secondary | ICD-10-CM | POA: Diagnosis not present

## 2021-12-26 DIAGNOSIS — I10 Essential (primary) hypertension: Secondary | ICD-10-CM | POA: Diagnosis not present

## 2021-12-26 DIAGNOSIS — I480 Paroxysmal atrial fibrillation: Secondary | ICD-10-CM

## 2021-12-26 NOTE — Patient Instructions (Signed)
Medication Instructions:  Your physician recommends that you continue on your current medications as directed. Please refer to the Current Medication list given to you today.  *If you need a refill on your cardiac medications before your next appointment, please call your pharmacy*  Lab Work: NONE ordered at this time of appointment   If you have labs (blood work) drawn today and your tests are completely normal, you will receive your results only by: MyChart Message (if you have MyChart) OR A paper copy in the mail If you have any lab test that is abnormal or we need to change your treatment, we will call you to review the results.  Testing/Procedures: NONE ordered at this time of appointment   Follow-Up: At [facility], you and your health needs are our priority.  As part of our continuing mission to provide you with exceptional heart care, we have created designated Provider Care Teams.  These Care Teams include your primary Cardiologist (physician) and Advanced Practice Providers (APPs -  Physician Assistants and Nurse Practitioners) who all work together to provide you with the care you need, when you need it.  We recommend signing up for the patient portal called "MyChart".  Sign up information is provided on this After Visit Summary.  MyChart is used to connect with patients for Virtual Visits (Telemedicine).  Patients are able to view lab/test results, encounter notes, upcoming appointments, etc.  Non-urgent messages can be sent to your provider as well.   To learn more about what you can do with MyChart, go to [email or web address].    Your next appointment:   6 month(s)  The format for your next appointment:   In Person  Provider:   Olga Millers, MD  or Marjie Skiff, PA-C        Other Instructions  Important Information About Sugar

## 2022-01-06 ENCOUNTER — Other Ambulatory Visit: Payer: Self-pay | Admitting: Cardiology

## 2022-01-06 DIAGNOSIS — E785 Hyperlipidemia, unspecified: Secondary | ICD-10-CM

## 2022-01-09 DIAGNOSIS — R262 Difficulty in walking, not elsewhere classified: Secondary | ICD-10-CM | POA: Diagnosis not present

## 2022-01-09 DIAGNOSIS — E113293 Type 2 diabetes mellitus with mild nonproliferative diabetic retinopathy without macular edema, bilateral: Secondary | ICD-10-CM | POA: Diagnosis not present

## 2022-01-09 DIAGNOSIS — H40012 Open angle with borderline findings, low risk, left eye: Secondary | ICD-10-CM | POA: Diagnosis not present

## 2022-01-09 DIAGNOSIS — H35033 Hypertensive retinopathy, bilateral: Secondary | ICD-10-CM | POA: Diagnosis not present

## 2022-01-09 DIAGNOSIS — I739 Peripheral vascular disease, unspecified: Secondary | ICD-10-CM | POA: Diagnosis not present

## 2022-01-09 DIAGNOSIS — H524 Presbyopia: Secondary | ICD-10-CM | POA: Diagnosis not present

## 2022-01-09 DIAGNOSIS — H401112 Primary open-angle glaucoma, right eye, moderate stage: Secondary | ICD-10-CM | POA: Diagnosis not present

## 2022-01-09 DIAGNOSIS — R1111 Vomiting without nausea: Secondary | ICD-10-CM | POA: Diagnosis not present

## 2022-01-09 DIAGNOSIS — E1169 Type 2 diabetes mellitus with other specified complication: Secondary | ICD-10-CM | POA: Diagnosis not present

## 2022-01-28 ENCOUNTER — Other Ambulatory Visit: Payer: Self-pay | Admitting: Cardiology

## 2022-01-28 DIAGNOSIS — I480 Paroxysmal atrial fibrillation: Secondary | ICD-10-CM

## 2022-01-28 DIAGNOSIS — I10 Essential (primary) hypertension: Secondary | ICD-10-CM

## 2022-02-10 ENCOUNTER — Other Ambulatory Visit: Payer: Self-pay | Admitting: Cardiology

## 2022-02-10 DIAGNOSIS — I480 Paroxysmal atrial fibrillation: Secondary | ICD-10-CM

## 2022-02-11 NOTE — Telephone Encounter (Signed)
Prescription refill request for Eliquis received. Indication: PAF Last office visit: 12/26/21  Virgie Dad PA-C Scr: 0.82 on 08/07/21 Age: 88 Weight: 38.2kg  Based on above findings Eliquis 2.5mg  twice daily is the appropriate dose.  Refill approved.

## 2022-02-19 ENCOUNTER — Telehealth: Payer: Self-pay | Admitting: Cardiology

## 2022-02-19 NOTE — Telephone Encounter (Signed)
*  STAT* If patient is at the pharmacy, call can be transferred to refill team.   1. Which medications need to be refilled? (please list name of each medication and dose if known)   [id number] - [place], [place] - [address]    2. Which pharmacy/location (including street and city if local pharmacy) is medication to be sent to?  [id number] - [place], [place] - [address]    3. Do they need a 30 day or 90 day supply? 90  Patient is completely out of this medication.

## 2022-03-13 DIAGNOSIS — E1169 Type 2 diabetes mellitus with other specified complication: Secondary | ICD-10-CM | POA: Diagnosis not present

## 2022-03-13 DIAGNOSIS — I10 Essential (primary) hypertension: Secondary | ICD-10-CM | POA: Diagnosis not present

## 2022-03-13 DIAGNOSIS — R600 Localized edema: Secondary | ICD-10-CM | POA: Diagnosis not present

## 2022-03-13 DIAGNOSIS — I509 Heart failure, unspecified: Secondary | ICD-10-CM | POA: Diagnosis not present

## 2022-03-13 DIAGNOSIS — E8770 Fluid overload, unspecified: Secondary | ICD-10-CM | POA: Diagnosis not present

## 2022-03-13 DIAGNOSIS — I6789 Other cerebrovascular disease: Secondary | ICD-10-CM | POA: Diagnosis not present

## 2022-03-13 DIAGNOSIS — I4891 Unspecified atrial fibrillation: Secondary | ICD-10-CM | POA: Diagnosis not present

## 2022-03-13 DIAGNOSIS — I119 Hypertensive heart disease without heart failure: Secondary | ICD-10-CM | POA: Diagnosis not present

## 2022-03-15 ENCOUNTER — Ambulatory Visit
Admission: RE | Admit: 2022-03-15 | Discharge: 2022-03-15 | Disposition: A | Discharge status: Home / Self Care | Payer: Medicare HMO | Source: Ambulatory Visit | Attending: Family Medicine | Admitting: Family Medicine

## 2022-03-15 ENCOUNTER — Other Ambulatory Visit: Payer: Self-pay | Admitting: Family Medicine

## 2022-03-15 DIAGNOSIS — E8770 Fluid overload, unspecified: Secondary | ICD-10-CM

## 2022-03-15 DIAGNOSIS — E861 Hypovolemia: Secondary | ICD-10-CM | POA: Diagnosis not present

## 2022-03-20 DIAGNOSIS — I110 Hypertensive heart disease with heart failure: Secondary | ICD-10-CM | POA: Diagnosis not present

## 2022-03-20 DIAGNOSIS — I501 Left ventricular failure: Secondary | ICD-10-CM | POA: Diagnosis not present

## 2022-03-20 DIAGNOSIS — Z0000 Encounter for general adult medical examination without abnormal findings: Secondary | ICD-10-CM | POA: Diagnosis not present

## 2022-03-20 DIAGNOSIS — E1169 Type 2 diabetes mellitus with other specified complication: Secondary | ICD-10-CM | POA: Diagnosis not present

## 2022-03-25 ENCOUNTER — Other Ambulatory Visit: Payer: Self-pay | Admitting: Cardiology

## 2022-03-27 ENCOUNTER — Telehealth: Payer: Self-pay | Admitting: Cardiology

## 2022-03-27 MED ORDER — METOPROLOL TARTRATE 50 MG PO TABS: ORAL_TABLET | ORAL | 0 refills | Status: DC

## 2022-03-27 NOTE — Telephone Encounter (Signed)
*  STAT* If patient is at the pharmacy, call can be transferred to refill team.   1. Which medications need to be refilled? (please list name of each medication and dose if known)   metoprolol tartrate (LOPRESSOR) 50 MG tablet   2. Which pharmacy/location (including street and city if local pharmacy) is medication to be sent to?  [id number] - [place], [place] - [address]   3. Do they need a 30 day or 90 day supply?   90 day   Daughter stated the patient is completely out of this medication and will need a short prescription [facility].

## 2022-05-10 DIAGNOSIS — H524 Presbyopia: Secondary | ICD-10-CM | POA: Diagnosis not present

## 2022-05-13 ENCOUNTER — Other Ambulatory Visit: Payer: Self-pay

## 2022-05-13 DIAGNOSIS — E785 Hyperlipidemia, unspecified: Secondary | ICD-10-CM

## 2022-05-13 MED ORDER — EZETIMIBE 10 MG PO TABS: 10.0000 mg | ORAL_TABLET | Freq: Every day | ORAL | 3 refills | Status: DC

## 2022-05-22 DIAGNOSIS — L84 Corns and callosities: Secondary | ICD-10-CM | POA: Diagnosis not present

## 2022-05-22 DIAGNOSIS — I501 Left ventricular failure: Secondary | ICD-10-CM | POA: Diagnosis not present

## 2022-05-22 DIAGNOSIS — I739 Peripheral vascular disease, unspecified: Secondary | ICD-10-CM | POA: Diagnosis not present

## 2022-05-22 DIAGNOSIS — F01B4 Vascular dementia, moderate, with anxiety: Secondary | ICD-10-CM | POA: Diagnosis not present

## 2022-05-22 DIAGNOSIS — E1151 Type 2 diabetes mellitus with diabetic peripheral angiopathy without gangrene: Secondary | ICD-10-CM | POA: Diagnosis not present

## 2022-05-22 DIAGNOSIS — I110 Hypertensive heart disease with heart failure: Secondary | ICD-10-CM | POA: Diagnosis not present

## 2022-05-22 DIAGNOSIS — L603 Nail dystrophy: Secondary | ICD-10-CM | POA: Diagnosis not present

## 2022-05-23 ENCOUNTER — Encounter (HOSPITAL_COMMUNITY): Payer: Self-pay | Admitting: *Deleted

## 2022-06-12 ENCOUNTER — Other Ambulatory Visit: Payer: Self-pay

## 2022-06-12 ENCOUNTER — Encounter (HOSPITAL_COMMUNITY): Payer: Self-pay

## 2022-06-12 ENCOUNTER — Emergency Department (HOSPITAL_COMMUNITY): Payer: Medicare HMO

## 2022-06-12 ENCOUNTER — Inpatient Hospital Stay (HOSPITAL_COMMUNITY)
Admission: EM | Admit: 2022-06-12 | Discharge: 2022-06-15 | DRG: 291 | Disposition: A | Discharge status: Home Health | Payer: Medicare HMO | Attending: Internal Medicine | Admitting: Internal Medicine

## 2022-06-12 DIAGNOSIS — I509 Heart failure, unspecified: Secondary | ICD-10-CM | POA: Diagnosis not present

## 2022-06-12 DIAGNOSIS — R0600 Dyspnea, unspecified: Secondary | ICD-10-CM

## 2022-06-12 DIAGNOSIS — R64 Cachexia: Secondary | ICD-10-CM | POA: Diagnosis not present

## 2022-06-12 DIAGNOSIS — I110 Hypertensive heart disease with heart failure: Secondary | ICD-10-CM | POA: Diagnosis not present

## 2022-06-12 DIAGNOSIS — Z8673 Personal history of transient ischemic attack (TIA), and cerebral infarction without residual deficits: Secondary | ICD-10-CM

## 2022-06-12 DIAGNOSIS — I081 Rheumatic disorders of both mitral and tricuspid valves: Secondary | ICD-10-CM | POA: Diagnosis present

## 2022-06-12 DIAGNOSIS — I160 Hypertensive urgency: Secondary | ICD-10-CM | POA: Diagnosis not present

## 2022-06-12 DIAGNOSIS — I2510 Atherosclerotic heart disease of native coronary artery without angina pectoris: Secondary | ICD-10-CM | POA: Diagnosis present

## 2022-06-12 DIAGNOSIS — J9811 Atelectasis: Secondary | ICD-10-CM | POA: Diagnosis present

## 2022-06-12 DIAGNOSIS — Z820 Family history of epilepsy and other diseases of the nervous system: Secondary | ICD-10-CM | POA: Diagnosis not present

## 2022-06-12 DIAGNOSIS — I4891 Unspecified atrial fibrillation: Secondary | ICD-10-CM | POA: Diagnosis present

## 2022-06-12 DIAGNOSIS — E1151 Type 2 diabetes mellitus with diabetic peripheral angiopathy without gangrene: Secondary | ICD-10-CM | POA: Diagnosis not present

## 2022-06-12 DIAGNOSIS — R0902 Hypoxemia: Secondary | ICD-10-CM | POA: Diagnosis not present

## 2022-06-12 DIAGNOSIS — Z7901 Long term (current) use of anticoagulants: Secondary | ICD-10-CM

## 2022-06-12 DIAGNOSIS — I700 Atherosclerosis of aorta: Secondary | ICD-10-CM | POA: Diagnosis present

## 2022-06-12 DIAGNOSIS — R0602 Shortness of breath: Secondary | ICD-10-CM

## 2022-06-12 DIAGNOSIS — Z79899 Other long term (current) drug therapy: Secondary | ICD-10-CM

## 2022-06-12 DIAGNOSIS — I480 Paroxysmal atrial fibrillation: Secondary | ICD-10-CM | POA: Diagnosis present

## 2022-06-12 DIAGNOSIS — J9601 Acute respiratory failure with hypoxia: Secondary | ICD-10-CM | POA: Diagnosis not present

## 2022-06-12 DIAGNOSIS — N179 Acute kidney failure, unspecified: Secondary | ICD-10-CM | POA: Diagnosis not present

## 2022-06-12 DIAGNOSIS — I4819 Other persistent atrial fibrillation: Secondary | ICD-10-CM | POA: Diagnosis not present

## 2022-06-12 DIAGNOSIS — Z7984 Long term (current) use of oral hypoglycemic drugs: Secondary | ICD-10-CM

## 2022-06-12 DIAGNOSIS — E7800 Pure hypercholesterolemia, unspecified: Secondary | ICD-10-CM | POA: Diagnosis present

## 2022-06-12 DIAGNOSIS — I5020 Unspecified systolic (congestive) heart failure: Secondary | ICD-10-CM

## 2022-06-12 DIAGNOSIS — I5021 Acute systolic (congestive) heart failure: Secondary | ICD-10-CM | POA: Diagnosis not present

## 2022-06-12 DIAGNOSIS — Z823 Family history of stroke: Secondary | ICD-10-CM

## 2022-06-12 DIAGNOSIS — R079 Chest pain, unspecified: Secondary | ICD-10-CM | POA: Diagnosis not present

## 2022-06-12 DIAGNOSIS — Z86718 Personal history of other venous thrombosis and embolism: Secondary | ICD-10-CM

## 2022-06-12 DIAGNOSIS — I5031 Acute diastolic (congestive) heart failure: Secondary | ICD-10-CM | POA: Diagnosis not present

## 2022-06-12 DIAGNOSIS — Z794 Long term (current) use of insulin: Secondary | ICD-10-CM

## 2022-06-12 DIAGNOSIS — R7989 Other specified abnormal findings of blood chemistry: Secondary | ICD-10-CM

## 2022-06-12 DIAGNOSIS — Z681 Body mass index (BMI) 19 or less, adult: Secondary | ICD-10-CM | POA: Diagnosis not present

## 2022-06-12 DIAGNOSIS — E785 Hyperlipidemia, unspecified: Secondary | ICD-10-CM | POA: Diagnosis not present

## 2022-06-12 DIAGNOSIS — I5033 Acute on chronic diastolic (congestive) heart failure: Secondary | ICD-10-CM | POA: Diagnosis not present

## 2022-06-12 DIAGNOSIS — Z833 Family history of diabetes mellitus: Secondary | ICD-10-CM

## 2022-06-12 DIAGNOSIS — E1169 Type 2 diabetes mellitus with other specified complication: Secondary | ICD-10-CM | POA: Diagnosis present

## 2022-06-12 DIAGNOSIS — Z23 Encounter for immunization: Secondary | ICD-10-CM | POA: Diagnosis not present

## 2022-06-12 DIAGNOSIS — D649 Anemia, unspecified: Secondary | ICD-10-CM | POA: Diagnosis present

## 2022-06-12 DIAGNOSIS — Z8249 Family history of ischemic heart disease and other diseases of the circulatory system: Secondary | ICD-10-CM

## 2022-06-12 DIAGNOSIS — E8770 Fluid overload, unspecified: Secondary | ICD-10-CM

## 2022-06-12 DIAGNOSIS — J811 Chronic pulmonary edema: Secondary | ICD-10-CM | POA: Diagnosis not present

## 2022-06-12 DIAGNOSIS — I4892 Unspecified atrial flutter: Secondary | ICD-10-CM | POA: Diagnosis present

## 2022-06-12 DIAGNOSIS — I10 Essential (primary) hypertension: Secondary | ICD-10-CM | POA: Diagnosis not present

## 2022-06-12 DIAGNOSIS — R54 Age-related physical debility: Secondary | ICD-10-CM | POA: Diagnosis present

## 2022-06-12 DIAGNOSIS — R000 Tachycardia, unspecified: Secondary | ICD-10-CM | POA: Diagnosis present

## 2022-06-12 DIAGNOSIS — Z1152 Encounter for screening for COVID-19: Secondary | ICD-10-CM

## 2022-06-12 DIAGNOSIS — J90 Pleural effusion, not elsewhere classified: Secondary | ICD-10-CM | POA: Diagnosis not present

## 2022-06-12 LAB — CBC
HCT: 29.8 % — ABNORMAL LOW (ref 36.0–46.0)
Hemoglobin: 9.7 g/dL — ABNORMAL LOW (ref 12.0–15.0)
MCH: 36.3 pg — ABNORMAL HIGH (ref 26.0–34.0)
MCHC: 32.6 g/dL (ref 30.0–36.0)
MCV: 111.6 fL — ABNORMAL HIGH (ref 80.0–100.0)
Platelets: 314 10*3/uL (ref 150–400)
RBC: 2.67 MIL/uL — ABNORMAL LOW (ref 3.87–5.11)
RDW: 15.9 % — ABNORMAL HIGH (ref 11.5–15.5)
WBC: 7.1 10*3/uL (ref 4.0–10.5)
nRBC: 0 % (ref 0.0–0.2)

## 2022-06-12 LAB — BRAIN NATRIURETIC PEPTIDE: B Natriuretic Peptide: 1283.1 pg/mL — ABNORMAL HIGH (ref 0.0–100.0)

## 2022-06-12 LAB — BASIC METABOLIC PANEL
Anion gap: 10 (ref 5–15)
BUN: 40 mg/dL — ABNORMAL HIGH (ref 8–23)
CO2: 19 mmol/L — ABNORMAL LOW (ref 22–32)
Calcium: 9.3 mg/dL (ref 8.9–10.3)
Chloride: 106 mmol/L (ref 98–111)
Creatinine, Ser: 1.25 mg/dL — ABNORMAL HIGH (ref 0.44–1.00)
GFR, Estimated: 42 mL/min — ABNORMAL LOW (ref >60)
Glucose, Bld: 228 mg/dL — ABNORMAL HIGH (ref 70–99)
Potassium: 4.6 mmol/L (ref 3.5–5.1)
Sodium: 135 mmol/L (ref 135–145)

## 2022-06-12 LAB — CBG MONITORING, ED: Glucose-Capillary: 214 mg/dL — ABNORMAL HIGH (ref 70–99)

## 2022-06-12 LAB — TROPONIN I (HIGH SENSITIVITY)
Troponin I (High Sensitivity): 14 ng/L (ref <18)
Troponin I (High Sensitivity): 13 ng/L (ref <18)

## 2022-06-12 LAB — RESP PANEL BY RT-PCR (RSV, FLU A&B, COVID)  RVPGX2
Influenza A by PCR: NEGATIVE
Influenza B by PCR: NEGATIVE
Resp Syncytial Virus by PCR: NEGATIVE
SARS Coronavirus 2 by RT PCR: NEGATIVE

## 2022-06-12 LAB — GLUCOSE, CAPILLARY: Glucose-Capillary: 371 mg/dL — ABNORMAL HIGH (ref 70–99)

## 2022-06-12 MED ORDER — INSULIN ASPART 100 UNIT/ML IJ SOLN
0.0000 [IU] | Freq: Three times a day (TID) | INTRAMUSCULAR | Status: DC
  Administered 2022-06-13: 2 [IU] via SUBCUTANEOUS
  Administered 2022-06-13: 5 [IU] via SUBCUTANEOUS
  Administered 2022-06-13 – 2022-06-14 (×2): 1 [IU] via SUBCUTANEOUS
  Administered 2022-06-14: 2 [IU] via SUBCUTANEOUS
  Administered 2022-06-14: 1 [IU] via SUBCUTANEOUS
  Administered 2022-06-15: 3 [IU] via SUBCUTANEOUS
  Administered 2022-06-15: 9 [IU] via SUBCUTANEOUS
  Filled 2022-06-12: qty 0.09

## 2022-06-12 MED ORDER — ACETAMINOPHEN 325 MG PO TABS
650.0000 mg | ORAL_TABLET | Freq: Four times a day (QID) | ORAL | Status: DC | PRN
  Administered 2022-06-14: 650 mg via ORAL
  Filled 2022-06-12: qty 2

## 2022-06-12 MED ORDER — IOHEXOL 350 MG/ML SOLN
100.0000 mL | Freq: Once | INTRAVENOUS | Status: AC | PRN
  Administered 2022-06-12: 75 mL via INTRAVENOUS

## 2022-06-12 MED ORDER — FUROSEMIDE 10 MG/ML IJ SOLN
40.0000 mg | Freq: Once | INTRAMUSCULAR | Status: AC
  Administered 2022-06-12: 40 mg via INTRAVENOUS
  Filled 2022-06-12: qty 4

## 2022-06-12 MED ORDER — ACETAMINOPHEN 650 MG RE SUPP: 650.0000 mg | Freq: Four times a day (QID) | RECTAL | Status: DC | PRN

## 2022-06-12 MED ORDER — EMPAGLIFLOZIN 10 MG PO TABS
10.0000 mg | ORAL_TABLET | Freq: Every day | ORAL | Status: DC
  Administered 2022-06-13: 10 mg via ORAL
  Filled 2022-06-12 (×2): qty 1

## 2022-06-12 MED ORDER — HYDRALAZINE HCL 20 MG/ML IJ SOLN: 10.0000 mg | INTRAMUSCULAR | Status: DC | PRN

## 2022-06-12 MED ORDER — FUROSEMIDE 10 MG/ML IJ SOLN
40.0000 mg | Freq: Two times a day (BID) | INTRAMUSCULAR | Status: DC
  Administered 2022-06-13: 40 mg via INTRAVENOUS
  Filled 2022-06-12: qty 4

## 2022-06-12 MED ORDER — PNEUMOCOCCAL 20-VAL CONJ VACC 0.5 ML IM SUSY
0.5000 mL | PREFILLED_SYRINGE | INTRAMUSCULAR | Status: AC
  Administered 2022-06-13: 0.5 mL via INTRAMUSCULAR
  Filled 2022-06-12: qty 0.5

## 2022-06-12 MED ORDER — ONDANSETRON HCL 4 MG PO TABS: 4.0000 mg | ORAL_TABLET | Freq: Four times a day (QID) | ORAL | Status: DC | PRN

## 2022-06-12 MED ORDER — ONDANSETRON HCL 4 MG/2ML IJ SOLN: 4.0000 mg | Freq: Four times a day (QID) | INTRAMUSCULAR | Status: DC | PRN

## 2022-06-12 MED ORDER — SODIUM CHLORIDE (PF) 0.9 % IJ SOLN
INTRAMUSCULAR | Status: AC
  Filled 2022-06-12: qty 50

## 2022-06-12 MED ORDER — LEVALBUTEROL HCL 0.63 MG/3ML IN NEBU: 0.6300 mg | INHALATION_SOLUTION | Freq: Four times a day (QID) | RESPIRATORY_TRACT | Status: DC | PRN

## 2022-06-12 MED ORDER — NITROGLYCERIN 2 % TD OINT
1.0000 [in_us] | TOPICAL_OINTMENT | Freq: Once | TRANSDERMAL | Status: AC
  Administered 2022-06-12: 1 [in_us] via TOPICAL
  Filled 2022-06-12: qty 1

## 2022-06-12 NOTE — ED Provider Notes (Signed)
[facility] Provider Note   CSN: [id number] Arrival date & time: 06/12/22  1218     History  Chief Complaint  Patient presents with   Tachycardia   Chest Pain    [name] is a 89 y.o. female.  The history is provided by the patient and medical records. No language interpreter was used.  Chest Pain Pain location:  Substernal area Pain quality: aching and sharp   Pain radiates to:  Does not radiate Pain severity:  Moderate Onset quality:  Gradual Duration:  2 days Timing:  Constant Progression:  Waxing and waning Chronicity:  New Relieved by:  Nothing Worsened by:  Coughing Ineffective treatments:  None tried Associated symptoms: cough, fatigue, lower extremity edema, orthopnea and shortness of breath   Associated symptoms: no abdominal pain, no altered mental status, no anxiety, no back pain, no diaphoresis, no fever, no headache, no nausea, no near-syncope, no numbness, no palpitations, no syncope and no vomiting        Home Medications Prior to Admission medications   Medication Sig Start Date End Date Taking? Authorizing Provider  ELIQUIS 2.5 MG TABS tablet TAKE 1 TABLET BY MOUTH TWICE A DAY 02/11/22   Lelon Perla, MD  ACCU-CHEK SOFTCLIX LANCETS lancets  01/16/18   [provider]  acetaminophen (TYLENOL) 650 MG CR tablet Take 1,300 mg by mouth every 8 (eight) hours as needed for pain.    [provider]  amiodarone (PACERONE) 200 MG tablet Take 1 tablet (200 mg total) by mouth 2 (two) times daily for 10 days, THEN 1 tablet (200 mg total) daily. 11/28/21 01/07/22  Lelon Perla, MD  atorvastatin (LIPITOR) 40 MG tablet Take 1 tablet (40 mg total) by mouth daily. 03/22/20 08/15/21  Deberah Pelton, NP  diclofenac Sodium (VOLTAREN) 1 % GEL Apply 2 g topically 2 (two) times daily as needed (pain).    [provider]  diltiazem (CARDIZEM CD) 240 MG 24 hr capsule TAKE 1 CAPSULE (240 MG  TOTAL) BY MOUTH DAILY. 01/29/22 04/29/22  Lelon Perla, MD  ezetimibe (ZETIA) 10 MG tablet Take 1 tablet (10 mg total) by mouth daily. 05/13/22   Lelon Perla, MD  glipiZIDE (GLUCOTROL) 10 MG tablet Take 10 mg by mouth 2 (two) times daily. 11/13/21   [provider]  Glucerna (GLUCERNA) LIQD Take 237 mLs by mouth 3 (three) times daily between meals. 08/07/21   Elgergawy, Silver Huguenin, MD  insulin aspart (NOVOLOG) 100 UNIT/ML injection Inject 0-9 Units into the skin 3 (three) times daily with meals. 08/08/21   Baglia, Corrina, PA-C  LANTUS SOLOSTAR 100 UNIT/ML Solostar Pen Inject 15 Units into the skin at bedtime. 08/07/21   Elgergawy, Silver Huguenin, MD  latanoprost (XALATAN) 0.005 % ophthalmic solution Place 1 drop into both eyes daily.    [provider]  lisinopril (ZESTRIL) 20 MG tablet TAKE 1 TABLET (20 MG TOTAL) BY MOUTH DAILY. 01/29/22   Lelon Perla, MD  metFORMIN (GLUCOPHAGE) 500 MG tablet Take 1,000 mg by mouth 2 (two) times daily. 06/27/21   [provider]  metoprolol tartrate (LOPRESSOR) 50 MG tablet TAKE 1 TABLET BY MOUTH TWICE A DAY 03/27/22   Lelon Perla, MD  Multiple Vitamin (MULTIVITAMIN) tablet Take 1 tablet by mouth daily. Centrum silver    [provider]  mupirocin ointment (BACTROBAN) 2 % Place 1 application. into the nose 2 (two) times daily. 08/08/21   Karoline Caldwell, PA-C  Omega-3 Fatty Acids (FISH OIL) 1200 MG CAPS Take 1,200 mg by mouth daily.    [provider]  iron polysaccharides (NIFEREX) 150 MG capsule Take 150 mg by mouth daily.    07/04/11  [provider]  topiramate (TOPAMAX) 15 MG capsule Take 15 mg by mouth 2 (two) times daily.    07/04/11  [provider]      Allergies    Patient has no known allergies.    Review of Systems   Review of Systems  Constitutional:  Positive for fatigue. Negative for chills, diaphoresis and fever.  HENT:  Negative for congestion.   Respiratory:  Positive for  cough and shortness of breath. Negative for chest tightness and wheezing.   Cardiovascular:  Positive for chest pain and orthopnea. Negative for palpitations, leg swelling, syncope and near-syncope.  Gastrointestinal:  Negative for abdominal pain, constipation, diarrhea, nausea and vomiting.  Genitourinary:  Negative for dysuria and flank pain.  Musculoskeletal:  Negative for back pain, neck pain and neck stiffness.  Skin:  Negative for rash and wound.  Neurological:  Negative for light-headedness, numbness and headaches.  Psychiatric/Behavioral:  Negative for agitation and confusion.   All other systems reviewed and are negative.   Physical Exam Updated Vital Signs BP (!) 178/72 (BP Location: Right Arm)   Pulse 75   Temp 97.7 F (36.5 C) (Oral)   Resp 20   Ht 5' (1.524 m)   Wt 39 kg   SpO2 (!) 81%   BMI 16.80 kg/m  Physical Exam Vitals and nursing note reviewed.  Constitutional:      General: She is not in acute distress.    Appearance: She is well-developed. She is not ill-appearing, toxic-appearing or diaphoretic.  HENT:     Head: Normocephalic and atraumatic.     Right Ear: External ear normal.     Left Ear: External ear normal.     Nose: Nose normal.     Mouth/Throat:     Pharynx: No oropharyngeal exudate.  Eyes:     Conjunctiva/sclera: Conjunctivae normal.     Pupils: Pupils are equal, round, and reactive to light.  Cardiovascular:     Rate and Rhythm: Normal rate and regular rhythm.     Heart sounds: Normal heart sounds. No murmur heard. Pulmonary:     Effort: Tachypnea present. No respiratory distress.     Breath sounds: No stridor. Rhonchi and rales present. No decreased breath sounds or wheezing.  Chest:     Chest wall: No tenderness.  Abdominal:     General: There is no distension.     Palpations: Abdomen is soft.     Tenderness: There is no abdominal tenderness. There is no rebound.  Musculoskeletal:     Cervical back: Normal range of motion and neck  supple.     Right lower leg: No tenderness. Edema present.     Left lower leg: No tenderness. Edema present.  Skin:    General: Skin is warm.     Findings: No erythema or rash.  Neurological:     Mental Status: She is alert and oriented to person, place, and time.     Motor: No abnormal muscle tone.     Coordination: Coordination normal.     Deep Tendon Reflexes: Reflexes are normal and symmetric.     ED Results / Procedures / Treatments   Labs (all labs ordered are listed, but only abnormal results are displayed) Labs Reviewed  BASIC METABOLIC PANEL - Abnormal; Notable  for the following components:      Result Value   CO2 19 (*)    Glucose, Bld 228 (*)    BUN 40 (*)    Creatinine, Ser 1.25 (*)    GFR, Estimated 42 (*)    All other components within normal limits  CBC - Abnormal; Notable for the following components:   RBC 2.67 (*)    Hemoglobin 9.7 (*)    HCT 29.8 (*)    MCV 111.6 (*)    MCH 36.3 (*)    RDW 15.9 (*)    All other components within normal limits  BRAIN NATRIURETIC PEPTIDE - Abnormal; Notable for the following components:   B Natriuretic Peptide 1,283.1 (*)    All other components within normal limits  CBG MONITORING, ED - Abnormal; Notable for the following components:   Glucose-Capillary 214 (*)    All other components within normal limits  RESP PANEL BY RT-PCR (RSV, FLU A&B, COVID)  [id number]  CBG MONITORING, ED  TROPONIN I (HIGH SENSITIVITY)  TROPONIN I (HIGH SENSITIVITY)    EKG EKG Interpretation  Date/Time:  Wednesday June 12 2022 12:33:44 EST Ventricular Rate:  71 PR Interval:  169 QRS Duration: 80 QT Interval:  414 QTC Calculation: 450 R Axis:   62 Text Interpretation: Sinus rhythm when compared to prior, overall similar appearance. No STEMI Confirmed by Antony Blackbird [phone]) on 06/12/2022 1:30:06 PM  Radiology CT Angio Chest PE W and/or Wo Contrast  Result Date: 06/12/2022 CLINICAL DATA:  Pulmonary embolism suspected.  High  probability. EXAM: CT ANGIOGRAPHY CHEST WITH CONTRAST TECHNIQUE: Multidetector CT imaging of the chest was performed using the standard protocol during bolus administration of intravenous contrast. Multiplanar CT image reconstructions and MIPs were obtained to evaluate the vascular anatomy. RADIATION DOSE REDUCTION: This exam was performed according to the departmental dose-optimization program which includes automated exposure control, adjustment of the mA and/or kV according to patient size and/or use of iterative reconstruction technique. CONTRAST:  76m OMNIPAQUE IOHEXOL 350 MG/ML SOLN COMPARISON:  Chest radiography same day FINDINGS: Cardiovascular: Mild cardiomegaly. Coronary artery calcification and aortic atherosclerotic calcification are present. Pulmonary arterial opacification is excellent. There are no pulmonary emboli. Mediastinum/Nodes: No mediastinal or hilar mass or lymphadenopathy. Lungs/Pleura: Bilateral pleural effusions layering dependently with dependent pulmonary atelectasis, particularly pronounced in the lower lobes. Mild edema within the expanded lung. Findings most consistent with heart failure. Upper Abdomen: Negative Musculoskeletal: Ordinary mild thoracic degenerative changes. Review of the MIP images confirms the above findings. IMPRESSION: 1. No pulmonary emboli. 2. Mild cardiomegaly. Coronary artery calcification and aortic atherosclerotic calcification. 3. Bilateral pleural effusions layering dependently with dependent pulmonary atelectasis, particularly pronounced in the lower lobes. Mild edema within the expanded lung. Findings most consistent with heart failure. 4. Aortic atherosclerosis. Aortic Atherosclerosis (ICD10-I70.0). Electronically Signed   By: MNelson ChimesM.D.   On: 06/12/2022 16:36   DG Chest Portable 1 View  Result Date: 06/12/2022 CLINICAL DATA:  Chest pain and shortness of breath. EXAM: PORTABLE CHEST 1 VIEW COMPARISON:  Chest x-ray dated March 15, 2022.  FINDINGS: Unchanged borderline cardiomegaly. New diffuse interstitial thickening with layering bilateral pleural effusions and bibasilar atelectasis. No pneumothorax. No acute osseous abnormality. IMPRESSION: 1. New congestive heart failure. Electronically Signed   By: WTitus DubinM.D.   On: 06/12/2022 13:24    Procedures Procedures    Medications Ordered in ED Medications  sodium chloride (PF) 0.9 % injection (has no administration in time range)  iohexol (OMNIPAQUE) 350 MG/ML injection  100 mL (75 mLs Intravenous Contrast Given 06/12/22 1617)  furosemide (LASIX) injection 40 mg (40 mg Intravenous Given 06/12/22 1724)    ED Course/ Medical Decision Making/ A&P                             Medical Decision Making Amount and/or Complexity of Data Reviewed Labs: ordered. Radiology: ordered.  Risk Prescription drug management. Decision regarding hospitalization.    [name] is a 89 y.o. female with a past medical history significant for A-fib and DVT on Eliquis therapy, hypertension, hyperlipidemia, diabetes, peripheral arterial disease and stroke, and vascular dementia with delirium who presents with respiratory distress and hypoxia.  According to patient, she has had shortness breath the last few days as well as peripheral edema.  She said that she is having some chest tightness but denies acute chest pain.  She says is not stabbing.  Is worsened with coughing that she has had for the last 2 days.  She otherwise denies fevers or chills.  Denies nausea, vomiting, constipation, diarrhea, or urinary changes.  She says her legs are slightly edematous but better than they have been in the past.  Denies history of heart failure.  On arrival, patient found to be hypoxic with oxygen around 81% on room air.  Patient placed on 2 L initially.  On my exam, lungs had rales and rhonchi but were not any tenderness in her back or chest.  Abdomen nontender.  Pulses present in extremities.  Legs  are edematous bilaterally.  Patient is tachypneic.  Oxygen saturations began to drop and oxygen requirement was increased.  Due to history of clots we will get CT PE study on top of the x-ray she is already had.  X-ray showed evidence of new heart failure and fluid overload.  Labs began to return and BNP is over 1200.  Troponin negative x 2.  Viral testing negative.  She has mild anemia but no leukocytosis.  Less concern for pneumonia this time.   5:00 PM CT scan showed no evidence of pulm embolism.  Shows fluid.  Suspect new heart failure.  Patient will be admitted for further management of hypoxic respiratory failure in the setting of fluid overload and suspected new heart failure.   Care transferred to oncoming team to await admission for suspected new heart failure with fluid overload and worsening oxygen requirement.         Final Clinical Impression(s) / ED Diagnoses Final diagnoses:  Hypoxia  Shortness of breath  Hypervolemia, unspecified hypervolemia type  Elevated brain natriuretic peptide (BNP) level    Clinical Impression: 1. Hypoxia   2. Shortness of breath   3. Hypervolemia, unspecified hypervolemia type   4. Elevated brain natriuretic peptide (BNP) level     Disposition: Admit  This note was prepared with assistance of Dragon voice recognition software. Occasional wrong-word or sound-a-like substitutions may have occurred due to the inherent limitations of voice recognition software.        Rene Gonsoulin, Gwenyth Allegra, MD 06/12/22 1728

## 2022-06-12 NOTE — ED Notes (Signed)
Urine sent to lab 

## 2022-06-12 NOTE — ED Notes (Signed)
Patient transported to CT 

## 2022-06-12 NOTE — H&P (Signed)
History and Physical    [name] [id number] DOB: 10/04/1932 DOA: 06/12/2022  PCP: Lucianne Lei, MD  Patient coming from: home  I have personally briefly reviewed patient's old medical records in [place]  Chief Complaint:chest pain , sob    HPI: [name] is a 89 y.o. female with medical history significant of  Atrial fibrillation, DMII, DVT , Hyperlipidemia, Hypertension, Leg pian , PVD, CVA who presents to ED brought in by family for acute on set of chest pain with associated sob and weakness.  Per patient states she has had sob x 1 week , however chest pain started today.She describes pain as an aching feeling in the middle of her chest. She also noted increase swelling of her lower extremities. She notes s/p treatment in ED she feels improved and currently denies any chest pain but notes although her breathing is improved she is still short of breath.  She notes no associated fever/chills/ n/v/d/or abdominal pain or presyncope.   ED Course:  Afeb, bp 178/72, hy75, rr 20  sat 81%  on ra - placed on 3 L  Wbc 7.1 , hgb 9.7 , MCV 111.6, plt 314 glu 214 EKG sinus rhythm   Na  135, K 4.6, co2 19, gly 228, cr 1.25 up from base 0.8  CE 14,13 BN 1283.1  Resp neg   Chest CT IMPRESSION: 1. No pulmonary emboli. 2. Mild cardiomegaly. Coronary artery calcification and aortic atherosclerotic calcification. 3. Bilateral pleural effusions layering dependently with dependent pulmonary atelectasis, particularly pronounced in the lower lobes. Mild edema within the expanded lung. Findings most consistent with heart failure. 4. Aortic atherosclerosis. Tx lasix  40 mg    Review of Systems: As per HPI otherwise 10 point review of systems negative.   Past Medical History:  Diagnosis Date   Arthritis    Atrial fibrillation ([place])    Diabetes mellitus    DVT (deep venous thrombosis) ([place])    Hyperlipidemia    Hypertension    Leg pain    Peripheral arterial disease  ([place])    Stroke ([place]) 1999    Past Surgical History:  Procedure Laterality Date   ABDOMINAL AORTOGRAM W/LOWER EXTREMITY N/A 07/11/2021   Procedure: ABDOMINAL AORTOGRAM W/LOWER EXTREMITY;  Surgeon: Broadus John, MD;  Location: [place] [facility] LAB;  Service: Cardiovascular;  Laterality: N/A;   [place] N/A 02/15/2020   Procedure: CARDIOVERSION;  Surgeon: Acie Fredrickson, Wonda Cheng, MD;  Location: [facility] ENDOSCOPY;  Service: Cardiovascular;  Laterality: N/A;   CARDIOVERSION N/A 08/07/2021   Procedure: CARDIOVERSION;  Surgeon: Janina Mayo, MD;  Location: [facility] ENDOSCOPY;  Service: Cardiovascular;  Laterality: N/A;   CATARACT EXTRACTION  2000   CESAREAN SECTION     ENDARTERECTOMY FEMORAL Left 07/26/2021   Procedure: LEFT COMMON FEMORAL ENDARTERECTOMY;  Surgeon: Broadus John, MD;  Location: [place];  Service: Vascular;  Laterality: Left;   EYE SURGERY     FEMORAL BYPASS Left July 23, 2010   LUMBAR DISC SURGERY     PATCH ANGIOPLASTY Left 07/26/2021   Procedure: PATCH ANGIOPLASTY WITH XENOSURE BIOLOGIC 1 cm x 6 cm PATCH;  Surgeon: Broadus John, MD;  Location: [place];  Service: Vascular;  Laterality: Left;   SPINE SURGERY     TEE WITHOUT CARDIOVERSION N/A 08/07/2021   Procedure: TRANSESOPHAGEAL ECHOCARDIOGRAM (TEE);  Surgeon: Janina Mayo, MD;  Location: [place];  Service: Cardiovascular;  Laterality: N/A;     reports that  she has never smoked. She has never used smokeless tobacco. She reports that she does not drink alcohol and does not use drugs.  No Known Allergies  Family History  Problem Relation Age of Onset   Hypertension Mother    Heart attack Mother    Diabetes Father    Hypertension Father    Stroke Father    Stroke Sister    Diabetes Sister    Heart disease Sister        Before age 37-Aneurysm   Dementia Sister    Alzheimer's disease Sister    Diabetes Brother    Diabetes Brother    Heart disease Brother        Before age 52   Heart attack  Brother    Heart attack Sister    Diabetes Sister    Alzheimer's disease Sister     Prior to Admission medications   Medication Sig Start Date End Date Taking? Authorizing Provider  ELIQUIS 2.5 MG TABS tablet TAKE 1 TABLET BY MOUTH TWICE A DAY 02/11/22   Lelon Perla, MD  ACCU-CHEK SOFTCLIX LANCETS lancets  01/16/18   [provider]  acetaminophen (TYLENOL) 650 MG CR tablet Take 1,300 mg by mouth every 8 (eight) hours as needed for pain.    [provider]  amiodarone (PACERONE) 200 MG tablet Take 1 tablet (200 mg total) by mouth 2 (two) times daily for 10 days, THEN 1 tablet (200 mg total) daily. 11/28/21 01/07/22  Lelon Perla, MD  atorvastatin (LIPITOR) 40 MG tablet Take 1 tablet (40 mg total) by mouth daily. 03/22/20 08/15/21  Deberah Pelton, NP  diclofenac Sodium (VOLTAREN) 1 % GEL Apply 2 g topically 2 (two) times daily as needed (pain).    [provider]  diltiazem (CARDIZEM CD) 240 MG 24 hr capsule TAKE 1 CAPSULE (240 MG TOTAL) BY MOUTH DAILY. 01/29/22 04/29/22  Lelon Perla, MD  ezetimibe (ZETIA) 10 MG tablet Take 1 tablet (10 mg total) by mouth daily. 05/13/22   Lelon Perla, MD  glipiZIDE (GLUCOTROL) 10 MG tablet Take 10 mg by mouth 2 (two) times daily. 11/13/21   [provider]  Glucerna (GLUCERNA) LIQD Take 237 mLs by mouth 3 (three) times daily between meals. 08/07/21   Elgergawy, Silver Huguenin, MD  insulin aspart (NOVOLOG) 100 UNIT/ML injection Inject 0-9 Units into the skin 3 (three) times daily with meals. 08/08/21   Baglia, Corrina, PA-C  LANTUS SOLOSTAR 100 UNIT/ML Solostar Pen Inject 15 Units into the skin at bedtime. 08/07/21   Elgergawy, Silver Huguenin, MD  latanoprost (XALATAN) 0.005 % ophthalmic solution Place 1 drop into both eyes daily.    [provider]  lisinopril (ZESTRIL) 20 MG tablet TAKE 1 TABLET (20 MG TOTAL) BY MOUTH DAILY. 01/29/22   Lelon Perla, MD  metFORMIN (GLUCOPHAGE) 500 MG tablet Take 1,000 mg by  mouth 2 (two) times daily. 06/27/21   [provider]  metoprolol tartrate (LOPRESSOR) 50 MG tablet TAKE 1 TABLET BY MOUTH TWICE A DAY 03/27/22   Lelon Perla, MD  Multiple Vitamin (MULTIVITAMIN) tablet Take 1 tablet by mouth daily. Centrum silver    [provider]  mupirocin ointment (BACTROBAN) 2 % Place 1 application. into the nose 2 (two) times daily. 08/08/21   Baglia, Corrina, PA-C  Omega-3 Fatty Acids (FISH OIL) 1200 MG CAPS Take 1,200 mg by mouth daily.    [provider]  iron polysaccharides (NIFEREX) 150 MG capsule Take 150 mg  by mouth daily.    07/04/11  [provider]  topiramate (TOPAMAX) 15 MG capsule Take 15 mg by mouth 2 (two) times daily.    07/04/11  [provider]    Physical Exam: Vitals:   06/12/22 1530 06/12/22 1652 06/12/22 1657 06/12/22 1845  BP: (!) 182/76 (!) 174/86  (!) 196/80  Pulse: 70 67  79  Resp: (!) 29 (!) 22  (!) 24  Temp:   97.7 F (36.5 C)   TempSrc:   Oral   SpO2: 92% (!) 79%  (!) 88%  Weight:      Height:        Constitutional: NAD, calm, comfortable, Vitals:   06/12/22 1530 06/12/22 1652 06/12/22 1657 06/12/22 1845  BP: (!) 182/76 (!) 174/86  (!) 196/80  Pulse: 70 67  79  Resp: (!) 29 (!) 22  (!) 24  Temp:   97.7 F (36.5 C)   TempSrc:   Oral   SpO2: 92% (!) 79%  (!) 88%  Weight:      Height:       Eyes: PERRL, lids and conjunctivae normal ENMT: Mucous membranes are moist. Posterior pharynx clear of any exudate or lesions.Normal dentition.  Neck: normal, supple, no masses, no thyromegaly Respiratory: + crackles. mild accessory muscle use.  Cardiovascular: Regular rate and rhythm, no murmurs / rubs / gallops. No extremity edema. 2+ pedal pulses.  Abdomen: no tenderness, no masses palpated. No hepatosplenomegaly. Bowel sounds positive.  Musculoskeletal: no clubbing / cyanosis. No joint deformity upper and lower extremities. Good ROM, no contractures. Normal muscle tone.  Skin: no rashes,  lesions, ulcers. No induration Neurologic: CN 2-12 grossly intact. Sensation intact,  Strength 5/5 in all 4.  Psychiatric: Normal judgment and insight. Alert and oriented x 3. Normal mood.    Labs on Admission: I have personally reviewed following labs and imaging studies  CBC: Recent Labs  Lab 06/12/22 1248  WBC 7.1  HGB 9.7*  HCT 29.8*  MCV 111.6*  PLT [id number]   Basic Metabolic Panel: Recent Labs  Lab 06/12/22 1248  NA 135  K 4.6  CL 106  CO2 19*  GLUCOSE 228*  BUN 40*  CREATININE 1.25*  CALCIUM 9.3   GFR: Estimated Creatinine Clearance: 19.5 mL/min (A) (by C-G formula based on SCr of 1.25 mg/dL (H)). Liver Function Tests: No results for input(s): "AST", "ALT", "ALKPHOS", "BILITOT", "PROT", "ALBUMIN" in the last 168 hours. No results for input(s): "LIPASE", "AMYLASE" in the last 168 hours. No results for input(s): "AMMONIA" in the last 168 hours. Coagulation Profile: No results for input(s): "INR", "PROTIME" in the last 168 hours. Cardiac Enzymes: No results for input(s): "CKTOTAL", "CKMB", "CKMBINDEX", "TROPONINI" in the last 168 hours. BNP (last 3 results) No results for input(s): "PROBNP" in the last 8760 hours. HbA1C: No results for input(s): "HGBA1C" in the last 72 hours. CBG: Recent Labs  Lab 06/12/22 1232  GLUCAP 214*   Lipid Profile: No results for input(s): "CHOL", "HDL", "LDLCALC", "TRIG", "CHOLHDL", "LDLDIRECT" in the last 72 hours. Thyroid Function Tests: No results for input(s): "TSH", "T4TOTAL", "FREET4", "T3FREE", "THYROIDAB" in the last 72 hours. Anemia Panel: No results for input(s): "VITAMINB12", "FOLATE", "FERRITIN", "TIBC", "IRON", "RETICCTPCT" in the last 72 hours. Urine analysis:    Component Value Date/Time   COLORURINE COLORLESS (A) 07/28/2021 1742   APPEARANCEUR CLEAR 07/28/2021 1742   LABSPEC 1.003 (L) 07/28/2021 1742   PHURINE 9.0 (H) 07/28/2021 1742   GLUCOSEU NEGATIVE 07/28/2021 1742   HGBUR NEGATIVE  07/28/2021 1742    BILIRUBINUR NEGATIVE 07/28/2021 1742   KETONESUR NEGATIVE 07/28/2021 1742   PROTEINUR NEGATIVE 07/28/2021 1742   UROBILINOGEN 0.2 10/28/2007 0214   NITRITE NEGATIVE 07/28/2021 1742   LEUKOCYTESUR NEGATIVE 07/28/2021 1742    Radiological Exams on Admission: CT Angio Chest PE W and/or Wo Contrast  Result Date: 06/12/2022 CLINICAL DATA:  Pulmonary embolism suspected.  High probability. EXAM: CT ANGIOGRAPHY CHEST WITH CONTRAST TECHNIQUE: Multidetector CT imaging of the chest was performed using the standard protocol during bolus administration of intravenous contrast. Multiplanar CT image reconstructions and MIPs were obtained to evaluate the vascular anatomy. RADIATION DOSE REDUCTION: This exam was performed according to the departmental dose-optimization program which includes automated exposure control, adjustment of the mA and/or kV according to patient size and/or use of iterative reconstruction technique. CONTRAST:  25m OMNIPAQUE IOHEXOL 350 MG/ML SOLN COMPARISON:  Chest radiography same day FINDINGS: Cardiovascular: Mild cardiomegaly. Coronary artery calcification and aortic atherosclerotic calcification are present. Pulmonary arterial opacification is excellent. There are no pulmonary emboli. Mediastinum/Nodes: No mediastinal or hilar mass or lymphadenopathy. Lungs/Pleura: Bilateral pleural effusions layering dependently with dependent pulmonary atelectasis, particularly pronounced in the lower lobes. Mild edema within the expanded lung. Findings most consistent with heart failure. Upper Abdomen: Negative Musculoskeletal: Ordinary mild thoracic degenerative changes. Review of the MIP images confirms the above findings. IMPRESSION: 1. No pulmonary emboli. 2. Mild cardiomegaly. Coronary artery calcification and aortic atherosclerotic calcification. 3. Bilateral pleural effusions layering dependently with dependent pulmonary atelectasis, particularly pronounced in the lower lobes. Mild edema within the  expanded lung. Findings most consistent with heart failure. 4. Aortic atherosclerosis. Aortic Atherosclerosis (ICD10-I70.0). Electronically Signed   By: MNelson ChimesM.D.   On: 06/12/2022 16:36   DG Chest Portable 1 View  Result Date: 06/12/2022 CLINICAL DATA:  Chest pain and shortness of breath. EXAM: PORTABLE CHEST 1 VIEW COMPARISON:  Chest x-ray dated March 15, 2022. FINDINGS: Unchanged borderline cardiomegaly. New diffuse interstitial thickening with layering bilateral pleural effusions and bibasilar atelectasis. No pneumothorax. No acute osseous abnormality. IMPRESSION: 1. New congestive heart failure. Electronically Signed   By: WTitus DubinM.D.   On: 06/12/2022 13:24    EKG: Independently reviewed. See above   Assessment/Plan  New onset CHF with associated acute hypoxic respiratory failure -currently on O2 , will wean as able  -in setting of uncontrolled blood pressure  -lasix 40 mg iv bid  -cycle ce, check tsh, echo in am  -ekg without hyperacute st -twave changes  -cardiology consult in am     Hypertensive Urgency  -nitro paste x1 now  - prn bolus dosing of hydralazine -resume home regimen  -continue with lasix    Atrial fibrillation -resume Eliquis  -amiodarone , cardizem    DMII -Resume lantus  -Start iss/fs    DVT -continue Eliquis   Hyperlipidemia -continue home regimen once med rec completed   PVD -on eliquis  CVA -on secondary ppx with eliquis  DVT prophylaxis: Eliquis Code Status: full/ as discussed per patient wishes in event of cardiac arrest  Family Communication: none at bedside Disposition Plan: patient  expected to be admitted greater than 2 midnights  Consults called: none  Admission status: progressive   SClance BollMD [facility]   If 7PM-7AM, please contact night-coverage [email or web address] Password [email or web address] 06/12/2022, 7:56 PM

## 2022-06-12 NOTE — ED Triage Notes (Signed)
Patient is here for evaluation of her heart racing and chest pain. Family reports this started around 0600 this morning.

## 2022-06-13 ENCOUNTER — Inpatient Hospital Stay (HOSPITAL_COMMUNITY): Payer: Medicare HMO

## 2022-06-13 DIAGNOSIS — R0600 Dyspnea, unspecified: Secondary | ICD-10-CM | POA: Diagnosis not present

## 2022-06-13 DIAGNOSIS — I509 Heart failure, unspecified: Secondary | ICD-10-CM | POA: Diagnosis not present

## 2022-06-13 DIAGNOSIS — E7800 Pure hypercholesterolemia, unspecified: Secondary | ICD-10-CM

## 2022-06-13 DIAGNOSIS — I4819 Other persistent atrial fibrillation: Secondary | ICD-10-CM

## 2022-06-13 DIAGNOSIS — I10 Essential (primary) hypertension: Secondary | ICD-10-CM | POA: Diagnosis not present

## 2022-06-13 DIAGNOSIS — I480 Paroxysmal atrial fibrillation: Secondary | ICD-10-CM

## 2022-06-13 DIAGNOSIS — E1169 Type 2 diabetes mellitus with other specified complication: Secondary | ICD-10-CM

## 2022-06-13 DIAGNOSIS — E785 Hyperlipidemia, unspecified: Secondary | ICD-10-CM

## 2022-06-13 LAB — CBC
HCT: 26.6 % — ABNORMAL LOW (ref 36.0–46.0)
Hemoglobin: 8.7 g/dL — ABNORMAL LOW (ref 12.0–15.0)
MCH: 35.8 pg — ABNORMAL HIGH (ref 26.0–34.0)
MCHC: 32.7 g/dL (ref 30.0–36.0)
MCV: 109.5 fL — ABNORMAL HIGH (ref 80.0–100.0)
Platelets: 291 10*3/uL (ref 150–400)
RBC: 2.43 MIL/uL — ABNORMAL LOW (ref 3.87–5.11)
RDW: 16 % — ABNORMAL HIGH (ref 11.5–15.5)
WBC: 6.8 10*3/uL (ref 4.0–10.5)
nRBC: 0 % (ref 0.0–0.2)

## 2022-06-13 LAB — COMPREHENSIVE METABOLIC PANEL
ALT: 66 U/L — ABNORMAL HIGH (ref 0–44)
AST: 38 U/L (ref 15–41)
Albumin: 3.4 g/dL — ABNORMAL LOW (ref 3.5–5.0)
Alkaline Phosphatase: 80 U/L (ref 38–126)
Anion gap: 9 (ref 5–15)
BUN: 35 mg/dL — ABNORMAL HIGH (ref 8–23)
CO2: 23 mmol/L (ref 22–32)
Calcium: 8.9 mg/dL (ref 8.9–10.3)
Chloride: 104 mmol/L (ref 98–111)
Creatinine, Ser: 1.2 mg/dL — ABNORMAL HIGH (ref 0.44–1.00)
GFR, Estimated: 44 mL/min — ABNORMAL LOW (ref >60)
Glucose, Bld: 355 mg/dL — ABNORMAL HIGH (ref 70–99)
Potassium: 3.8 mmol/L (ref 3.5–5.1)
Sodium: 136 mmol/L (ref 135–145)
Total Bilirubin: 0.8 mg/dL (ref 0.3–1.2)
Total Protein: 6.4 g/dL — ABNORMAL LOW (ref 6.5–8.1)

## 2022-06-13 LAB — GLUCOSE, CAPILLARY
Glucose-Capillary: 294 mg/dL — ABNORMAL HIGH (ref 70–99)
Glucose-Capillary: 151 mg/dL — ABNORMAL HIGH (ref 70–99)
Glucose-Capillary: 195 mg/dL — ABNORMAL HIGH (ref 70–99)
Glucose-Capillary: 392 mg/dL — ABNORMAL HIGH (ref 70–99)

## 2022-06-13 LAB — PROTIME-INR
INR: 1.2 (ref 0.8–1.2)
Prothrombin Time: 15.3 seconds — ABNORMAL HIGH (ref 11.4–15.2)

## 2022-06-13 MED ORDER — APIXABAN 2.5 MG PO TABS
2.5000 mg | ORAL_TABLET | Freq: Two times a day (BID) | ORAL | Status: DC
  Administered 2022-06-13 – 2022-06-15 (×5): 2.5 mg via ORAL
  Filled 2022-06-13 (×5): qty 1

## 2022-06-13 MED ORDER — ATORVASTATIN CALCIUM 40 MG PO TABS
40.0000 mg | ORAL_TABLET | Freq: Every day | ORAL | Status: DC
  Administered 2022-06-13 – 2022-06-15 (×3): 40 mg via ORAL
  Filled 2022-06-13 (×3): qty 1

## 2022-06-13 MED ORDER — DICLOFENAC SODIUM 1 % EX GEL: 2.0000 g | Freq: Two times a day (BID) | CUTANEOUS | Status: DC | PRN

## 2022-06-13 MED ORDER — DOCUSATE SODIUM 50 MG/5ML PO LIQD
200.0000 mg | Freq: Two times a day (BID) | ORAL | Status: DC
  Administered 2022-06-13 – 2022-06-15 (×5): 200 mg via ORAL
  Filled 2022-06-13 (×7): qty 20

## 2022-06-13 MED ORDER — EZETIMIBE 10 MG PO TABS
10.0000 mg | ORAL_TABLET | Freq: Every day | ORAL | Status: DC
  Administered 2022-06-13 – 2022-06-15 (×3): 10 mg via ORAL
  Filled 2022-06-13 (×3): qty 1

## 2022-06-13 MED ORDER — METOPROLOL TARTRATE 50 MG PO TABS
50.0000 mg | ORAL_TABLET | Freq: Two times a day (BID) | ORAL | Status: DC
  Administered 2022-06-13 – 2022-06-15 (×5): 50 mg via ORAL
  Filled 2022-06-13 (×5): qty 1

## 2022-06-13 MED ORDER — INSULIN GLARGINE-YFGN 100 UNIT/ML ~~LOC~~ SOLN
10.0000 [IU] | Freq: Every day | SUBCUTANEOUS | Status: DC
  Administered 2022-06-13 – 2022-06-14 (×2): 10 [IU] via SUBCUTANEOUS
  Filled 2022-06-13 (×3): qty 0.1

## 2022-06-13 MED ORDER — FUROSEMIDE 10 MG/ML IJ SOLN
40.0000 mg | Freq: Once | INTRAMUSCULAR | Status: AC
  Administered 2022-06-13: 40 mg via INTRAVENOUS
  Filled 2022-06-13: qty 4

## 2022-06-13 MED ORDER — OMEGA-3-ACID ETHYL ESTERS 1 G PO CAPS
1000.0000 mg | ORAL_CAPSULE | Freq: Every day | ORAL | Status: DC
  Administered 2022-06-13 – 2022-06-15 (×3): 1000 mg via ORAL
  Filled 2022-06-13 (×3): qty 1

## 2022-06-13 MED ORDER — PANTOPRAZOLE SODIUM 40 MG PO TBEC
40.0000 mg | DELAYED_RELEASE_TABLET | Freq: Every day | ORAL | Status: DC
  Administered 2022-06-13 – 2022-06-15 (×3): 40 mg via ORAL
  Filled 2022-06-13 (×3): qty 1

## 2022-06-13 MED ORDER — AMIODARONE HCL 200 MG PO TABS
200.0000 mg | ORAL_TABLET | Freq: Every day | ORAL | Status: DC
  Administered 2022-06-13 – 2022-06-15 (×3): 200 mg via ORAL
  Filled 2022-06-13 (×3): qty 1

## 2022-06-13 MED ORDER — LATANOPROST 0.005 % OP SOLN
1.0000 [drp] | Freq: Every day | OPHTHALMIC | Status: DC
  Administered 2022-06-13 – 2022-06-14 (×2): 1 [drp] via OPHTHALMIC
  Filled 2022-06-13: qty 2.5

## 2022-06-13 NOTE — Evaluation (Addendum)
Physical Therapy Evaluation Patient Details Name: [name] MRN: [id number] DOB: [birth year] Today's Date: 06/13/2022  History of Present Illness  89 y.o. female with past medical history of atrial fibrillation, type 2 diabetes, deep vein thrombosis, hyperlipidemia, hypertension, peripheral vascular disease, history of CVA was brought into the hospital for acute chest pain with shortness of breath and weakness.  Shortness of breath for 1 week and chest pain for 1 day.  In the ED patient was noted to be hypertensive and was hypoxic. Dx of CHF with acute hypoxic respiratory failure.  Clinical Impression  Pt admitted with above diagnosis. Pt ambulated 42' with RW with increased time, no loss of balance, SpO2 83% on room air walking, SpO2 95% on 4L at rest. Pt reports her daughter assists her daily with bathing/dressing and with transportation.  Pt gave conflicting information regarding her prior level of function, would need to confirm with family that they can provide needed level of care.  Pt currently with functional limitations due to the deficits listed below (see PT Problem List). Pt will benefit from skilled PT to increase their independence and safety with mobility to allow discharge to the venue listed below.          Recommendations for follow up therapy are one component of a multi-disciplinary discharge planning process, led by the attending physician.  Recommendations may be updated based on patient status, additional functional criteria and insurance authorization.  Follow Up Recommendations Home health PT      Assistance Recommended at Discharge Intermittent Supervision/Assistance  Patient can return home with the following  A little help with walking and/or transfers;A little help with bathing/dressing/bathroom;Assistance with cooking/housework;Assist for transportation;Help with stairs or ramp for entrance;Direct supervision/assist for medications management;Direct  supervision/assist for financial management    Equipment Recommendations None recommended by PT  Recommendations for Other Services       Functional Status Assessment Patient has had a recent decline in their functional status and demonstrates the ability to make significant improvements in function in a reasonable and predictable amount of time.     Precautions / Restrictions Precautions Precautions: Fall Precaution Comments: pt denies falls but would need to confirm this with family; monitor O2 Restrictions Weight Bearing Restrictions: No      Mobility  Bed Mobility Overal bed mobility: Modified Independent             General bed mobility comments: HOB up, used rail    Transfers Overall transfer level: Needs assistance Equipment used: Rolling walker (2 wheels) Transfers: Sit to/from Stand Sit to Stand: Min assist           General transfer comment: min A to power up    Ambulation/Gait Ambulation/Gait assistance: Min guard Gait Distance (Feet): 16 Feet Assistive device: Rolling walker (2 wheels) Gait Pattern/deviations: Step-through pattern, Decreased step length - right, Decreased step length - left, Shuffle Gait velocity: very slow     General Gait Details: very slow velocity, VCs to increase step length, distance limited by BLE fatigue, SpO2 83% on room air walking  Stairs            Wheelchair Mobility    Modified Rankin (Stroke Patients Only)       Balance Overall balance assessment: Needs assistance   Sitting balance-Leahy Scale: Good     Standing balance support: Bilateral upper extremity supported, Reliant on assistive device for balance, During functional activity Standing balance-Leahy Scale: Poor  Pertinent Vitals/Pain Pain Assessment Pain Assessment: No/denies pain    Home Living Family/patient expects to be discharged to:: Private residence Living Arrangements: Alone Available  Help at Discharge: Family;Available PRN/intermittently Type of Home: House Home Access: Stairs to enter Entrance Stairs-Rails: Right Entrance Stairs-Number of Steps: 4   Home Layout: One level Home Equipment: BSC/3in1;Rolling Walker (2 wheels);Cane - quad;Rollator (4 wheels);Shower seat;Grab bars - tub/shower Additional Comments: daughter comes daily to assist    Prior Function Prior Level of Function : Needs assist;Patient poor historian/Family not available             Mobility Comments: pt stated she walks with a cane then later stated she uses a RW ADLs Comments: daughter comes daily to assist as needed, pt does not drive     Hand Dominance        Extremity/Trunk Assessment   Upper Extremity Assessment Upper Extremity Assessment: Overall WFL for tasks assessed    Lower Extremity Assessment Lower Extremity Assessment: Overall WFL for tasks assessed    Cervical / Trunk Assessment Cervical / Trunk Assessment: Normal  Communication   Communication: No difficulties  Cognition Arousal/Alertness: Awake/alert Behavior During Therapy: WFL for tasks assessed/performed Overall Cognitive Status: No family/caregiver present to determine baseline cognitive functioning                                 General Comments: pt gave some conflicting info regarding prior functional level        General Comments      Exercises     Assessment/Plan    PT Assessment Patient needs continued PT services  PT Problem List Cardiopulmonary status limiting activity;Decreased mobility;Decreased activity tolerance;Decreased balance       PT Treatment Interventions Gait training;Therapeutic activities;Patient/family education;DME instruction;Balance training;Functional mobility training    PT Goals (Current goals can be found in the Care Plan section)  Acute Rehab PT Goals PT Goal Formulation: Patient unable to participate in goal setting Time For Goal Achievement:  06/21/22 Potential to Achieve Goals: Good    Frequency Min 3X/week     Co-evaluation               AM-PAC PT "6 Clicks" Mobility  Outcome Measure Help needed turning from your back to your side while in a flat bed without using bedrails?: A Little Help needed moving from lying on your back to sitting on the side of a flat bed without using bedrails?: A Little Help needed moving to and from a bed to a chair (including a wheelchair)?: A Little Help needed standing up from a chair using your arms (e.g., wheelchair or bedside chair)?: A Little Help needed to walk in hospital room?: A Little Help needed climbing 3-5 steps with a railing? : A Lot 6 Click Score: 17    End of Session Equipment Utilized During Treatment: Gait belt;Oxygen Activity Tolerance: Patient limited by fatigue Patient left: in chair;with chair alarm set;with call bell/phone within reach Nurse Communication: Mobility status PT Visit Diagnosis: Difficulty in walking, not elsewhere classified (R26.2);Unsteadiness on feet (R26.81)    Time: [id number] PT Time Calculation (min) (ACUTE ONLY): 18 min   Charges:   PT Evaluation $PT Eval Moderate Complexity: 1 Mod         Philomena Doheny PT 06/13/2022  Acute Rehabilitation Services  Office [phone]

## 2022-06-13 NOTE — Progress Notes (Signed)
Heart Failure Navigator Progress Note  Assessed for Heart & Vascular TOC clinic readiness.  Patient EF 50-55%, has a scheduled cardiology appointment with Dr. Stanford Breed on 06/26/2022. .   Navigator will sign off at this time.  Earnestine Leys, BSN, Clinical cytogeneticist Only

## 2022-06-13 NOTE — Progress Notes (Signed)
Chaplain offered prayer over [name].  Chaplain offered a compassionate presence and support.  Chaplain Rueben Kassim, MDiv  06/13/22 1000  Spiritual Encounters  Type of Visit Initial  Care provided to: Patient  Spiritual Framework  Presenting Themes Rituals and practive;Values and beliefs  Interventions  Spiritual Care Interventions Made Prayer;Compassionate presence;Established relationship of care and support

## 2022-06-13 NOTE — Progress Notes (Signed)
Heart Failure Navigator Progress Note  Assessed for Heart & Vascular TOC clinic readiness.  Patient Ef 50-55%, Has a appointment scheduled with Dr. Stanford Breed for 06/26/2022. .   Navigator will sign off at this time.    Earnestine Leys, BSN, Clinical cytogeneticist Only

## 2022-06-13 NOTE — Progress Notes (Addendum)
PROGRESS NOTE    [name]  [id number] DOB: [birth year] DOA: 06/12/2022 PCP: Lucianne Lei, MD    Brief Narrative:   [name] is a 89 y.o. female with past medical history of atrial fibrillation, type 2 diabetes, deep vein thrombosis, hyperlipidemia, hypertension, peripheral vascular disease, history of CVA was brought into the hospital for acute chest pain with shortness of breath and weakness.  Shortness of breath for 1 week and chest pain for 1 day.  In the ED patient was noted to be hypertensive and was hypoxic with pulse ox of 81% on room air and was placed on 3 L of oxygen.  Initial hemoglobin was 9.7.  EKG showed normal sinus rhythm.  Creatinine was elevated at 1.2 from baseline 0.8.  BNP was elevated at 1283.  CT scan of the chest showed no evidence of pulmonary embolism but bilateral pleural effusion and cardiomegaly.  Patient was given 40 mg of IV Lasix and was considered for admission to the hospital for further evaluation and treatment.     Assessment/Plan   CHF with acute hypoxic respiratory failure.   Imaging showed pleural effusion.Patient is currently on 6 L of oxygen by high flow nasal cannula.  Likely decompensated congestive heart failure secondary to uncontrolled hypertension.  On IV Lasix with significant diuresis.  Creatinine at 1.2.  EKG unremarkable.  Check 2D echocardiogram.  Strict intake and output charting.  Daily weights.  Will follow cardiology recommendation.  Patient is negative balance for 2560 mL so far.  Hypertensive Urgency  Received Nitropaste.  Continue metoprolol.  Patient was on lisinopril at home as well.    Paroxysmal atrial fibrillation  Patient is on amiodarone Cardizem metoprolol and lisinopril at home.  Continue amiodarone, metoprolol and Eliquis.  Currently rate controlled.   Diabetes mellitus type 2. Continue sliding-scale insulin.  Resume Lantus.  Closely monitor blood glucose levels.  Will put the patient on diabetic diet.     History of DVT continue Eliquis    Hyperlipidemia Continue ezetimibe   PVD On Eliquis.   CVA Secondary prophylaxis with Eliquis.  Deconditioning, Debility.  Will get PT evaluation.       DVT prophylaxis: apixaban (ELIQUIS) tablet 2.5 mg Start: 06/13/22 1000 apixaban (ELIQUIS) tablet 2.5 mg   Code Status:     Code Status: Full Code  Disposition: Home likely in 2 to 3 days.  Status is: Inpatient  Remains inpatient appropriate because: Decompensated heart failure, acute hypoxic respiratory failure   Family Communication:  Spoke with the daughter on the phone and updated her about the clinical condition of the patient.  Consultants:  Cardiology  Procedures:  None  Antimicrobials:  None  Anti-infectives (From admission, onward)    None      Subjective: Today, patient was seen and examined at bedside.  Patient complains of shortness of breath, cough, dyspnea with leg swelling.  No chest pain today.  Objective: Vitals:   06/12/22 2143 06/13/22 0140 06/13/22 0500 06/13/22 0758  BP:  (!) 143/64  (!) 169/71  Pulse:  77  82  Resp:    20  Temp:  98.2 F (36.8 C)  97.6 F (36.4 C)  TempSrc:  Oral  Oral  SpO2: 92% 100%  99%  Weight: 41.2 kg  39 kg   Height: 5' (1.524 m)       Intake/Output Summary (Last 24 hours) at 06/13/2022 1106 Last data filed at 06/13/2022 1038 Gross per 24 hour  Intake 240 ml  Output 2800 ml  Net -2560 ml   Filed Weights   06/12/22 1226 06/12/22 2143 06/13/22 0500  Weight: 39 kg 41.2 kg 39 kg    Physical Examination: Body mass index is 16.79 kg/m.   General: Appears frail and weak, thinly built, alert awake and Communicative, not in obvious distress, on 6 L of oxygen by nasal cannula HENT:   No scleral pallor or icterus noted. Oral mucosa is moist.  Chest: Diminished breath sounds bilaterally. CVS: S1 &S2 heard. No murmur.  Regular rate and rhythm. Abdomen: Soft, nontender, nondistended.  Bowel sounds are heard.    Extremities: No cyanosis, clubbing or edema.  Peripheral pulses are palpable.  Thin extremities but pitting edema on the lower extremity. Psych: Alert, awake and oriented, slow to respond, flat affect CNS:  No cranial nerve deficits.  Power equal in all extremities.   Skin: Warm and dry.  No rashes noted.  Data Reviewed:   CBC: Recent Labs  Lab 06/12/22 1248 06/13/22 0448  WBC 7.1 6.8  HGB 9.7* 8.7*  HCT 29.8* 26.6*  MCV 111.6* 109.5*  PLT 314 [id number]    Basic Metabolic Panel: Recent Labs  Lab 06/12/22 1248 06/13/22 0448  NA 135 136  K 4.6 3.8  CL 106 104  CO2 19* 23  GLUCOSE 228* 355*  BUN 40* 35*  CREATININE 1.25* 1.20*  CALCIUM 9.3 8.9    Liver Function Tests: Recent Labs  Lab 06/13/22 0448  AST 38  ALT 66*  ALKPHOS 80  BILITOT 0.8  PROT 6.4*  ALBUMIN 3.4*     Radiology Studies: DG CHEST PORT 1 VIEW  Result Date: 06/13/2022 CLINICAL DATA:  Dyspnea. EXAM: PORTABLE CHEST 1 VIEW COMPARISON:  June 12, 2022. FINDINGS: Stable cardiomediastinal silhouette. Stable bilateral pulmonary edema is noted with associated pleural effusions. Bony thorax is unremarkable. IMPRESSION: Stable bilateral pulmonary edema with associated pleural effusions. Electronically Signed   By: Marijo Conception M.D.   On: 06/13/2022 09:49   CT Angio Chest PE W and/or Wo Contrast  Result Date: 06/12/2022 CLINICAL DATA:  Pulmonary embolism suspected.  High probability. EXAM: CT ANGIOGRAPHY CHEST WITH CONTRAST TECHNIQUE: Multidetector CT imaging of the chest was performed using the standard protocol during bolus administration of intravenous contrast. Multiplanar CT image reconstructions and MIPs were obtained to evaluate the vascular anatomy. RADIATION DOSE REDUCTION: This exam was performed according to the departmental dose-optimization program which includes automated exposure control, adjustment of the mA and/or kV according to patient size and/or use of iterative reconstruction technique.  CONTRAST:  34m OMNIPAQUE IOHEXOL 350 MG/ML SOLN COMPARISON:  Chest radiography same day FINDINGS: Cardiovascular: Mild cardiomegaly. Coronary artery calcification and aortic atherosclerotic calcification are present. Pulmonary arterial opacification is excellent. There are no pulmonary emboli. Mediastinum/Nodes: No mediastinal or hilar mass or lymphadenopathy. Lungs/Pleura: Bilateral pleural effusions layering dependently with dependent pulmonary atelectasis, particularly pronounced in the lower lobes. Mild edema within the expanded lung. Findings most consistent with heart failure. Upper Abdomen: Negative Musculoskeletal: Ordinary mild thoracic degenerative changes. Review of the MIP images confirms the above findings. IMPRESSION: 1. No pulmonary emboli. 2. Mild cardiomegaly. Coronary artery calcification and aortic atherosclerotic calcification. 3. Bilateral pleural effusions layering dependently with dependent pulmonary atelectasis, particularly pronounced in the lower lobes. Mild edema within the expanded lung. Findings most consistent with heart failure. 4. Aortic atherosclerosis. Aortic Atherosclerosis (ICD10-I70.0). Electronically Signed   By: MNelson ChimesM.D.   On: 06/12/2022 16:36   DG Chest Portable 1 View  Result Date: 06/12/2022 CLINICAL DATA:  Chest  pain and shortness of breath. EXAM: PORTABLE CHEST 1 VIEW COMPARISON:  Chest x-ray dated March 15, 2022. FINDINGS: Unchanged borderline cardiomegaly. New diffuse interstitial thickening with layering bilateral pleural effusions and bibasilar atelectasis. No pneumothorax. No acute osseous abnormality. IMPRESSION: 1. New congestive heart failure. Electronically Signed   By: Titus Dubin M.D.   On: 06/12/2022 13:24      LOS: 1 day    Flora Lipps, MD [facility] Available via Epic secure chat 7am-7pm After these hours, please refer to coverage provider listed on [email or web address] 06/13/2022, 11:06 AM

## 2022-06-13 NOTE — Consult Note (Addendum)
Cardiology Consultation   Patient ID: [name] MRN: [id number]; DOB: [birth year]  Admit date: 06/12/2022 Date of Consult: 06/13/2022  PCP:  Lucianne Lei, [place] [facility] Cardiologist:  Kirk Ruths, MD        Patient Profile:   [name] is a 89 y.o. female with a hx of HTN, HLD, DM II, CVA, DVT, PAD and PAF/atrial flutter who is being seen 06/13/2022 for the evaluation of new CHF at the request of Dr. Louanne Belton.  History of Present Illness:   [name] is a cachectic 89 year old female with past medical history of HTN, HLD, DM II, CVA, DVT, PAD and PAF/atrial flutter.  Myoview performed in May 2007 showed EF 80%, normal perfusion.  Echocardiogram in November 2021 showed EF 50 to 55%, mild RV dysfunction.  She had cardioversion in November 2021.  She is followed by vascular surgery for PAD.  Patient underwent lower extremity angiography by Dr. Unk Lightning on 07/26/2021 due to critical limb ischemia, the study demonstrated near occlusion of the left common femoral artery with only profunda runoff with reconstitution of the posterior tibial artery distally.  She underwent left common femoral artery enterectomy.  Eliquis was held prior to procedure, unfortunately right after the surgery, she had altered mental status, code stroke was activated.  MRI was negative.  Hospital course was, gated by recurrent A-fib with RVR.  TEE DCCV was attempted however canceled due to mental status change.  She was started on amiodarone for rate control on top of metoprolol and diltiazem, and ultimately underwent TEE DCCV on 08/07/2021.  TEE demonstrated EF 50 to 55%, no left atrial thrombus, no significant valve disease.  I last saw the patient in the office in May 2023 at which time she was doing well.  Her most recent follow-up with with Sande Rives, PA-C on 12/26/2021 at which time she was still doing well.  She was admitted to [facility] on 06/13/2022 due to shortness  of breath.  Patient is a poor historian, according to her daughter, she has been having some dry cough, DOE along with orthopnea since Tuesday.  Tuesday night, she also complained of some chest discomfort.  Yesterday, her symptoms worsened prompting the family to take him to the ED.  On arrival, patient was hypoxic with oxygen saturation around 81% on room air.  Blood work shows BNP greater than 1200.  Hemoglobin down to 9.  Viral panel negative.  Serial troponin negative x 2.  Chest x-ray showed evidence of new heart failure.  CTA of the chest revealed no PE, mild cardiomegaly, coronary artery calcification and aortic atherosclerosis, bilateral pleural effusion with dependent pulmonary atelectasis, consistent with heart failure.  She was placed on 40 mg twice daily of IV Lasix.  Overnight, she put out 1.7 L of fluid.  Cardiology service consulted for heart failure.  Echocardiogram has been ordered and is currently pending.   Past Medical History:  Diagnosis Date   Arthritis    Atrial fibrillation ([place])    Diabetes mellitus    DVT (deep venous thrombosis) ([place])    Hyperlipidemia    Hypertension    Leg pain    Peripheral arterial disease ([place])    Stroke ([place]) 1999    Past Surgical History:  Procedure Laterality Date   ABDOMINAL AORTOGRAM W/LOWER EXTREMITY N/A 07/11/2021   Procedure: ABDOMINAL AORTOGRAM W/LOWER EXTREMITY;  Surgeon: Broadus John, MD;  Location: [place] CV LAB;  Service: Cardiovascular;  Laterality: N/A;  [place] N/A 02/15/2020   Procedure: CARDIOVERSION;  Surgeon: Nahser, Wonda Cheng, MD;  Location: [facility] ENDOSCOPY;  Service: Cardiovascular;  Laterality: N/A;   CARDIOVERSION N/A 08/07/2021   Procedure: CARDIOVERSION;  Surgeon: Janina Mayo, MD;  Location: [facility] ENDOSCOPY;  Service: Cardiovascular;  Laterality: N/A;   CATARACT EXTRACTION  2000   CESAREAN SECTION     ENDARTERECTOMY FEMORAL Left 07/26/2021   Procedure: LEFT COMMON FEMORAL  ENDARTERECTOMY;  Surgeon: Broadus John, MD;  Location: [place];  Service: Vascular;  Laterality: Left;   EYE SURGERY     FEMORAL BYPASS Left July 23, 2010   LUMBAR DISC SURGERY     PATCH ANGIOPLASTY Left 07/26/2021   Procedure: PATCH ANGIOPLASTY WITH XENOSURE BIOLOGIC 1 cm x 6 cm PATCH;  Surgeon: Broadus John, MD;  Location: [place];  Service: Vascular;  Laterality: Left;   SPINE SURGERY     TEE WITHOUT CARDIOVERSION N/A 08/07/2021   Procedure: TRANSESOPHAGEAL ECHOCARDIOGRAM (TEE);  Surgeon: Janina Mayo, MD;  Location: [facility] ENDOSCOPY;  Service: Cardiovascular;  Laterality: N/A;     Home Medications:  Prior to Admission medications   Medication Sig Start Date End Date Taking? Authorizing Provider  acetaminophen (TYLENOL) 650 MG CR tablet Take 1,300 mg by mouth 2 (two) times daily.   Yes [provider]  amiodarone (PACERONE) 200 MG tablet Take 1 tablet (200 mg total) by mouth 2 (two) times daily for 10 days, THEN 1 tablet (200 mg total) daily. Patient taking differently: Take 1 tablet (200 mg total) by mouth daily.  11/28/21 06/13/22 Yes Lelon Perla, MD  atorvastatin (LIPITOR) 40 MG tablet Take 1 tablet (40 mg total) by mouth daily. 03/22/20 06/13/22 Yes Cleaver, Jossie Ng, NP  diclofenac Sodium (VOLTAREN) 1 % GEL Apply 2 g topically 2 (two) times daily as needed (pain).   Yes [provider]  diltiazem (CARDIZEM CD) 240 MG 24 hr capsule TAKE 1 CAPSULE (240 MG TOTAL) BY MOUTH DAILY. 01/29/22 06/13/22 Yes Crenshaw, Denice Bors, MD  ELIQUIS 2.5 MG TABS tablet TAKE 1 TABLET BY MOUTH TWICE A DAY Patient taking differently: Take 2.5 mg by mouth 2 (two) times daily. 02/11/22  Yes Crenshaw, Denice Bors, MD  ENTRESTO 24-26 MG Take 1 tablet by mouth 2 (two) times daily. 06/08/22  Yes [provider]  ezetimibe (ZETIA) 10 MG tablet Take 1 tablet (10 mg total) by mouth daily. 05/13/22  Yes Lelon Perla, MD  glipiZIDE (GLUCOTROL) 10 MG tablet Take 10 mg by mouth 2 (two) times  daily. 11/13/21  Yes [provider]  LANTUS SOLOSTAR 100 UNIT/ML Solostar Pen Inject 15 Units into the skin at bedtime. Patient taking differently: Inject 10 Units into the skin at bedtime. 08/07/21  Yes Elgergawy, Silver Huguenin, MD  latanoprost (XALATAN) 0.005 % ophthalmic solution Place 1 drop into both eyes at bedtime.   Yes [provider]  metFORMIN (GLUCOPHAGE) 500 MG tablet Take 1,000 mg by mouth 2 (two) times daily. 06/27/21  Yes [provider]  metoprolol tartrate (LOPRESSOR) 50 MG tablet TAKE 1 TABLET BY MOUTH TWICE A DAY Patient taking differently: Take 50 mg by mouth 2 (two) times daily. 03/27/22  Yes Lelon Perla, MD  Multiple Vitamin (MULTIVITAMIN) tablet Take 1 tablet by mouth daily. Centrum silver   Yes [provider]  Omega-3 Fatty Acids (FISH OIL) 1200 MG CAPS Take 1,200 mg by mouth daily.   Yes [provider]  ondansetron (ZOFRAN) 4  MG tablet Take 4 mg by mouth 2 (two) times daily as needed for nausea or vomiting. 06/10/22  Yes [provider]  pantoprazole (PROTONIX) 40 MG tablet Take 40 mg by mouth daily. 05/07/22  Yes [provider]  ACCU-CHEK SOFTCLIX LANCETS lancets  01/16/18   [provider]  lisinopril (ZESTRIL) 20 MG tablet TAKE 1 TABLET (20 MG TOTAL) BY MOUTH DAILY. Patient not taking: Reported on 06/13/2022 01/29/22   Lelon Perla, MD  iron polysaccharides (NIFEREX) 150 MG capsule Take 150 mg by mouth daily.    07/04/11  [provider]  topiramate (TOPAMAX) 15 MG capsule Take 15 mg by mouth 2 (two) times daily.    07/04/11  [provider]    Inpatient Medications: Scheduled Meds:  apixaban  2.5 mg Oral BID   atorvastatin  40 mg Oral Daily   docusate  200 mg Oral BID   empagliflozin  10 mg Oral Daily   furosemide  40 mg Intravenous Q12H   insulin aspart  0-9 Units Subcutaneous TID WC   pneumococcal 20-valent conjugate vaccine  0.5 mL Intramuscular Tomorrow-1000    Continuous Infusions:  PRN Meds: acetaminophen **OR** acetaminophen, hydrALAZINE, levalbuterol, ondansetron **OR** ondansetron (ZOFRAN) IV  Allergies:   No Known Allergies  Social History:   Social History   Socioeconomic History   Marital status: Widowed    Spouse name: Not on file   Number of children: 1   Years of education: Not on file   Highest education level: Not on file  Occupational History   Occupation: Retired    Comment: Engineer, site  Tobacco Use   Smoking status: Never   Smokeless tobacco: Never  Vaping Use   Vaping Use: Never used  Substance and Sexual Activity   Alcohol use: No   Drug use: No   Sexual activity: Not on file  Other Topics Concern   Not on file  Social History Narrative   Lives alone. Has daily contact with friends. Transportation by friends and sometimes [employer].   Social Determinants of Health   Financial Resource Strain: Low Risk  (01/31/2020)   Overall Financial Resource Strain (CARDIA)    Difficulty of Paying Living Expenses: Not hard at all  Food Insecurity: No Food Insecurity (06/12/2022)   Hunger Vital Sign    Worried About Running Out of Food in the Last Year: Never true    Ran Out of Food in the Last Year: Never true  Transportation Needs: No Transportation Needs (06/12/2022)   PRAPARE - Hydrologist (Medical): No    Lack of Transportation (Non-Medical): No  Physical Activity: Sufficiently Active (01/31/2020)   Exercise Vital Sign    Days of Exercise per Week: 7 days    Minutes of Exercise per Session: 120 min  Stress: No Stress Concern Present (08/30/2019)   [place]    Feeling of Stress : Not at all  Social Connections: Not on file  Intimate Partner Violence: Not At Risk (06/12/2022)   Humiliation, Afraid, Rape, and Kick questionnaire    Fear of Current or Ex-Partner: No    Emotionally Abused: No     Physically Abused: No    Sexually Abused: No    Family History:    Family History  Problem Relation Age of Onset   Hypertension Mother    Heart attack Mother    Diabetes Father    Hypertension Father  Stroke Father    Stroke Sister    Diabetes Sister    Heart disease Sister        Before age 45-Aneurysm   Dementia Sister    Alzheimer's disease Sister    Diabetes Brother    Diabetes Brother    Heart disease Brother        Before age 48   Heart attack Brother    Heart attack Sister    Diabetes Sister    Alzheimer's disease Sister      ROS:  Please see the history of present illness.   All other ROS reviewed and negative.     Physical Exam/Data:   Vitals:   06/12/22 2143 06/13/22 0140 06/13/22 0500 06/13/22 0758  BP:  (!) 143/64  (!) 169/71  Pulse:  77  82  Resp:    20  Temp:  98.2 F (36.8 C)  97.6 F (36.4 C)  TempSrc:  Oral  Oral  SpO2: 92% 100%    Weight: 41.2 kg  39 kg   Height: 5' (1.524 m)       Intake/Output Summary (Last 24 hours) at 06/13/2022 0912 Last data filed at 06/13/2022 0758 Gross per 24 hour  Intake 240 ml  Output 1950 ml  Net -1710 ml      06/13/2022    5:00 AM 06/12/2022    9:43 PM 06/12/2022   12:26 PM  Last 3 Weights  Weight (lbs) 85 lb 15.7 oz 90 lb 13.3 oz 86 lb  Weight (kg) 39 kg 41.2 kg 39.009 kg     Body mass index is 16.79 kg/m.  General: Frail, weak, cachectic HEENT: normal Neck: no JVD Vascular: No carotid bruits; Distal pulses 2+ bilaterally Cardiac:  normal S1, S2; RRR; no murmur  Lungs:  clear to auscultation bilaterally, no wheezing, rhonchi or rales  Abd: soft, nontender, no hepatomegaly  Ext: trace RLE edema Musculoskeletal:  No deformities, BUE and BLE strength normal and equal Skin: warm and dry  Neuro: Unable to assess Psych: Flat  EKG:  The EKG was personally reviewed and demonstrates: Normal sinus rhythm, no significant ST-T wave changes Telemetry:  Telemetry was personally reviewed and  demonstrates: Normal sinus rhythm, no sign of recurrent atrial fibrillation.  Relevant CV Studies:  TEE 08/07/2021  1. Left ventricular ejection fraction, by estimation, is 50 to 55%. The  left ventricle has low normal function.   2. Right ventricular systolic function is low normal. The right  ventricular size is normal.   3. No left atrial/left atrial appendage thrombus was detected. The LAA  emptying velocity was 30 cm/s.   4. The mitral valve is normal in structure. No evidence of mitral valve  regurgitation.   5. The aortic valve is normal in structure. Aortic valve regurgitation is  not visualized.   6. There is mild (Grade II) plaque.   Conclusion(s)/Recommendation(s): No LA/LAA thrombus identified. Successful  cardioversion performed with restoration of normal sinus rhythm.   Laboratory Data:  High Sensitivity Troponin:   Recent Labs  Lab 06/12/22 1248 06/12/22 1507  TROPONINIHS 14 13     Chemistry Recent Labs  Lab 06/12/22 1248 06/13/22 0448  NA 135 136  K 4.6 3.8  CL 106 104  CO2 19* 23  GLUCOSE 228* 355*  BUN 40* 35*  CREATININE 1.25* 1.20*  CALCIUM 9.3 8.9  GFRNONAA 42* 44*  ANIONGAP 10 9    Recent Labs  Lab 06/13/22 0448  PROT 6.4*  ALBUMIN 3.4*  AST  38  ALT 66*  ALKPHOS 80  BILITOT 0.8   Lipids No results for input(s): "CHOL", "TRIG", "HDL", "LABVLDL", "LDLCALC", "CHOLHDL" in the last 168 hours.  Hematology Recent Labs  Lab 06/12/22 1248 06/13/22 0448  WBC 7.1 6.8  RBC 2.67* 2.43*  HGB 9.7* 8.7*  HCT 29.8* 26.6*  MCV 111.6* 109.5*  MCH 36.3* 35.8*  MCHC 32.6 32.7  RDW 15.9* 16.0*  PLT 314 291   Thyroid No results for input(s): "TSH", "FREET4" in the last 168 hours.  BNP Recent Labs  Lab 06/12/22 1248  BNP 1,283.1*    DDimer No results for input(s): "DDIMER" in the last 168 hours.   Radiology/Studies:  CT Angio Chest PE W and/or Wo Contrast  Result Date: 06/12/2022 CLINICAL DATA:  Pulmonary embolism suspected.  High  probability. EXAM: CT ANGIOGRAPHY CHEST WITH CONTRAST TECHNIQUE: Multidetector CT imaging of the chest was performed using the standard protocol during bolus administration of intravenous contrast. Multiplanar CT image reconstructions and MIPs were obtained to evaluate the vascular anatomy. RADIATION DOSE REDUCTION: This exam was performed according to the departmental dose-optimization program which includes automated exposure control, adjustment of the mA and/or kV according to patient size and/or use of iterative reconstruction technique. CONTRAST:  46m OMNIPAQUE IOHEXOL 350 MG/ML SOLN COMPARISON:  Chest radiography same day FINDINGS: Cardiovascular: Mild cardiomegaly. Coronary artery calcification and aortic atherosclerotic calcification are present. Pulmonary arterial opacification is excellent. There are no pulmonary emboli. Mediastinum/Nodes: No mediastinal or hilar mass or lymphadenopathy. Lungs/Pleura: Bilateral pleural effusions layering dependently with dependent pulmonary atelectasis, particularly pronounced in the lower lobes. Mild edema within the expanded lung. Findings most consistent with heart failure. Upper Abdomen: Negative Musculoskeletal: Ordinary mild thoracic degenerative changes. Review of the MIP images confirms the above findings. IMPRESSION: 1. No pulmonary emboli. 2. Mild cardiomegaly. Coronary artery calcification and aortic atherosclerotic calcification. 3. Bilateral pleural effusions layering dependently with dependent pulmonary atelectasis, particularly pronounced in the lower lobes. Mild edema within the expanded lung. Findings most consistent with heart failure. 4. Aortic atherosclerosis. Aortic Atherosclerosis (ICD10-I70.0). Electronically Signed   By: MNelson ChimesM.D.   On: 06/12/2022 16:36   DG Chest Portable 1 View  Result Date: 06/12/2022 CLINICAL DATA:  Chest pain and shortness of breath. EXAM: PORTABLE CHEST 1 VIEW COMPARISON:  Chest x-ray dated March 15, 2022.  FINDINGS: Unchanged borderline cardiomegaly. New diffuse interstitial thickening with layering bilateral pleural effusions and bibasilar atelectasis. No pneumothorax. No acute osseous abnormality. IMPRESSION: 1. New congestive heart failure. Electronically Signed   By: WTitus DubinM.D.   On: 06/12/2022 13:24     Assessment and Plan:   Acute heart failure: Pending echocardiogram  -Patient describes dry cough, dyspnea on exertion and orthopnea since Tuesday.  Blood pressure very high on arrival, unclear if related to uncontrolled high blood pressure.  Family states that she has been compliant with her medications, her daughter manage her medication.  -Patient appears to be very frail on exam.  She put out 1.8 L of fluid.  Will obtain chest x-ray as patient is a poor historian and cannot tell me if her breathing is getting better, hold off on further IV diuresis for now given her low baseline weight of only 88 pounds and significant diuresis.  PAF/atrial flutter: Maintaining sinus rhythm.  On Eliquis, home amiodarone, diltiazem, metoprolol and lisinopril were all held.  Resume amiodarone and metoprolol.  Hold off on restarting diltiazem for now pending echocardiogram to assess EF.  Hypertension: Home diltiazem, metoprolol and lisinopril also  held on arrival.  Blood pressure very high.  Will resume metoprolol for now.  Depending on blood pressure may resume lisinopril later.  Anemia: Hemoglobin trending down despite diuresis.  Hemoglobin 9.7-->8.7.  Hemoglobin was 11 in April 2023.  Daughters was not aware of any bleeding issue.  Continue to monitor with serial lab work.  Hyperlipidemia: On Lipitor  DM2: Sliding scale insulin  History of PAD   Risk Assessment/Risk Scores:        New York Heart Association (NYHA) Functional Class NYHA Class III  CHA2DS2-VASc Score = 7   This indicates a 11.2% annual risk of stroke. The patient's score is based upon: CHF History: 0 HTN History:  1 Diabetes History: 1 Stroke History: 2 Vascular Disease History: 0 Age Score: 2 Gender Score: 1         For questions or updates, please contact [place] Please consult [email or web address] for contact info under    Signed, Almyra Deforest, [place]  06/13/2022 9:12 AM  Patient seen and examined  I agree with findings as noted bya H Meng above    Pt is a frail 89 yo  with hx of HTN, PAF admitted with dysponea, orthopnea and chest pressure  Poor historian.    SInce admission she has diuresed 1.8 L    On exam Neck:  JVP is normal Lungs are CTA Cardiac RRR  No S3  ABd is supple  Ext are thin, without edema  I agree with H Meng aobe   HOld further diuresis for today  COntinue to follow I/O, renal function     Echo has been ordered to assess EF   Resume metoprolol  Follow and consider lisinopril  Will continue to follow   Dorris Carnes MD

## 2022-06-13 NOTE — Hospital Course (Signed)
[name] is a 89 y.o. female with past medical history of atrial fibrillation, type 2 diabetes, deep vein thrombosis, hyperlipidemia, hypertension, peripheral vascular disease, history of CVA was brought into the hospital for acute chest pain with shortness of breath and weakness.  Shortness of breath for 1 week and chest pain for 1 day.  In the ED patient was noted to be hypertensive and was hypoxic with pulse ox of 81% on room air and was placed on 3 L of oxygen.  Initial hemoglobin was 9.7.  EKG showed normal sinus rhythm.  Creatinine was elevated at 1.2 from baseline 0.8.  BNP was elevated at 1283.  CT scan of the chest showed no evidence of pulmonary embolism but bilateral pleural effusion and cardiomegaly.  Patient was given 40 mg of IV Lasix and was considered for admission to the hospital for further evaluation and treatment.     Assessment/Plan   New onset CHF with associated acute hypoxic respiratory failure.  Imaging showed pleural effusion. Patient is currently on 6 L of oxygen by high flow nasal cannula.  Likely decompensated congestive heart failure secondary to uncontrolled hypertension.  Currently on Lasix 40 mg twice daily.  Creatinine at 1.2.  EKG unremarkable.  Check 2D echocardiogram.  Strict intake and output charting.  Daily weights.Will consult cardiology.    Hypertensive Urgency  Received Nitropaste.  Continue hydralazine.    Atrial fibrillation Continue amiodarone, Cardizem and Eliquis when med rec is done..   Diabetes mellitus type 2. Continue sliding-scale insulin.  Resume Lantus.    History of DVT -continue Eliquis    Hyperlipidemia Resume home medication when med rec.   PVD On Eliquis.   CVA Secondary prophylaxis with Eliquis.

## 2022-06-13 NOTE — Progress Notes (Signed)
Patient admitted to room 1410, she is alert and oriented to person and place. No sign of acute distress and she denies pain. Patient is a poor historian and information for admission was obtained from her daughter Willaim Rayas who is at bedside. Safety measures in place due to patient history of falls (fell at home 2 weeks ago per patients daughter). Needs are anticipated and met. Reoriented as needed call bell is within reach.

## 2022-06-13 NOTE — Progress Notes (Signed)
Called by RN to assess patient with difficulty holding/reading oxygen saturations. Placed on 6L [place] High Flow nasal canula. VSS.

## 2022-06-13 NOTE — TOC Initial Note (Signed)
Transition of Care [facility]) - Initial/Assessment Note    Patient Details  Name: [name] MRN: [id number] Date of Birth: [birth year]  Transition of Care [facility]) CM/SW Contact:    Dessa Phi, RN Phone Number: 06/13/2022, 11:33 AM  Clinical Narrative:  From home alone. [name](dtr) visits daily. Used [facility] in past will use again if HHC needed.Not on 02 @ home. If home 02 needed [employer] rep [name] following.                 Expected Discharge Plan: Home/Self Care Barriers to Discharge: Continued Medical Work up   Patient Goals and CMS Choice Patient states their goals for this hospitalization and ongoing recovery are::  (Home)          Expected Discharge Plan and Services                                              Prior Living Arrangements/Services                       Activities of Daily Living Home Assistive Devices/Equipment: CBG Meter, Eyeglasses, Cane (specify quad or straight) (BP cuff at home) ADL Screening (condition at time of admission) Patient's cognitive ability adequate to safely complete daily activities?: No Is the patient deaf or have difficulty hearing?: No Does the patient have difficulty seeing, even when wearing glasses/contacts?: No Does the patient have difficulty concentrating, remembering, or making decisions?: Yes Patient able to express need for assistance with ADLs?: Yes Does the patient have difficulty dressing or bathing?: Yes Independently performs ADLs?: No Communication: Independent Dressing (OT): Needs assistance Is this a change from baseline?: Change from baseline, expected to last <3days Grooming: Needs assistance Is this a change from baseline?: Change from baseline, expected to last <3 days Feeding: Independent Bathing: Needs assistance Is this a change from baseline?: Change from baseline, expected to last <3 days Toileting: Needs assistance Is this a change from baseline?: Change from baseline,  expected to last <3 days In/Out Bed: Needs assistance Is this a change from baseline?: Change from baseline, expected to last <3 days Walks in Home: Needs assistance Is this a change from baseline?: Pre-admission baseline Does the patient have difficulty walking or climbing stairs?: Yes Weakness of Legs: Both Weakness of Arms/Hands: Both  Permission Sought/Granted                  Emotional Assessment              Admission diagnosis:  Shortness of breath [R06.02] CHF (congestive heart failure) ([place]) [I50.9] Hypoxia [R09.02] Elevated brain natriuretic peptide (BNP) level [R79.89] Hypervolemia, unspecified hypervolemia type [E87.70] Patient Active Problem List   Diagnosis Date Noted   CHF (congestive heart failure) ([place]) 06/12/2022   Protein-calorie malnutrition, severe 07/31/2021   Vascular dementia with delirium ([place]) 07/31/2021   Critical limb ischemia of left lower extremity ([place]) 07/26/2021   PAD (peripheral artery disease) ([place]) 07/26/2021   Need for prophylactic antibiotic 02/14/2020   Atrial flutter with rapid ventricular response ([place]) 02/13/2020   Hypomagnesemia 02/13/2020   Atrial flutter ([place]) 02/12/2020   Cramp of both lower extremities 12/15/2014   Foot cramps 12/15/2014   Stiffness of foot 06/10/2014   Aftercare following surgery of the circulatory system, NEC 12/09/2013   Peripheral vascular disease ([place]) 10/03/2011   Bypass graft stenosis ([place]) 07/04/2011  Chronic total occlusion of artery of the extremities ([place]) 07/04/2011   Pure hypercholesterolemia 03/29/2009   Essential hypertension, malignant 03/29/2009   Essential hypertension 03/28/2009   Type 2 diabetes mellitus with hyperlipidemia ([place]) 08/30/2008   HYPERLIPIDEMIA-MIXED 08/30/2008   ATRIAL FIBRILLATION 08/30/2008   SYNCOPE AND COLLAPSE 08/30/2008   PCP:  Lucianne Lei, MD Pharmacy:   [id number] [address]Phone: [phone]: [phone] [facility] [address] [id number]: [phone]: [phone]    Social Determinants of Health (SDOH) Social History: SDOH Screenings   Food Insecurity: No Food Insecurity (06/12/2022)  Housing: Low Risk  (06/12/2022)  Transportation Needs: No Transportation Needs (06/12/2022)  Utilities: Not At Risk (06/12/2022)  Alcohol Screen: Low Risk  (06/13/2021)  Depression (PHQ2-9): Low Risk  (06/13/2021)  Financial Resource Strain: Low Risk  (01/31/2020)  Physical Activity: Sufficiently Active (01/31/2020)  Stress: No Stress Concern Present (08/30/2019)  Tobacco Use: Low Risk  (06/12/2022)   SDOH Interventions:     Readmission Risk Interventions     No data to display

## 2022-06-13 NOTE — Progress Notes (Signed)
Mobility Specialist - Progress Note  Pre-mobility: 69 bpm HR, 98% SpO2 Post-mobility: 72 bpm HR, 100% SPO2   06/13/22 1445  Oxygen Therapy  O2 Device HFNC  O2 Flow Rate (L/min) 4 L/min  Mobility  Activity Ambulated with assistance to bathroom  Level of Assistance Minimal assist, patient does 75% or more  Assistive Device Front wheel walker  Distance Ambulated (ft) 20 ft  Range of Motion/Exercises Active  Activity Response Tolerated well  $Mobility charge 1 Mobility   Pt was found in bed and agreeable to try ambulation. Upon sitting EOB stated wanting to use the bathroom. Was min-A for bed mobility and going from sit>stand. Afterwards needed cues for feet and hand placement during ambulation. Had shuffled steps and cued her to try bigger steps. Upon returning to bed stated being fatigued but agreeable to seated exercises. At EOS was left in bed with necessities and NT in room.   Seated BLE Exercises: 10 reps each  1) Knee Extension   2) Marching

## 2022-06-14 ENCOUNTER — Inpatient Hospital Stay (HOSPITAL_COMMUNITY): Payer: Medicare HMO

## 2022-06-14 ENCOUNTER — Ambulatory Visit: Payer: Medicare HMO

## 2022-06-14 ENCOUNTER — Ambulatory Visit (HOSPITAL_COMMUNITY): Payer: Medicare HMO

## 2022-06-14 DIAGNOSIS — R0602 Shortness of breath: Secondary | ICD-10-CM | POA: Diagnosis not present

## 2022-06-14 DIAGNOSIS — I5021 Acute systolic (congestive) heart failure: Secondary | ICD-10-CM | POA: Diagnosis not present

## 2022-06-14 LAB — BASIC METABOLIC PANEL
Anion gap: 11 (ref 5–15)
BUN: 33 mg/dL — ABNORMAL HIGH (ref 8–23)
CO2: 27 mmol/L (ref 22–32)
Calcium: 8.9 mg/dL (ref 8.9–10.3)
Chloride: 103 mmol/L (ref 98–111)
Creatinine, Ser: 1.29 mg/dL — ABNORMAL HIGH (ref 0.44–1.00)
GFR, Estimated: 40 mL/min — ABNORMAL LOW (ref >60)
Glucose, Bld: 209 mg/dL — ABNORMAL HIGH (ref 70–99)
Potassium: 2.9 mmol/L — ABNORMAL LOW (ref 3.5–5.1)
Sodium: 141 mmol/L (ref 135–145)

## 2022-06-14 LAB — GLUCOSE, CAPILLARY
Glucose-Capillary: 184 mg/dL — ABNORMAL HIGH (ref 70–99)
Glucose-Capillary: 134 mg/dL — ABNORMAL HIGH (ref 70–99)
Glucose-Capillary: 149 mg/dL — ABNORMAL HIGH (ref 70–99)
Glucose-Capillary: 366 mg/dL — ABNORMAL HIGH (ref 70–99)

## 2022-06-14 LAB — CBC
HCT: 28.7 % — ABNORMAL LOW (ref 36.0–46.0)
Hemoglobin: 8.9 g/dL — ABNORMAL LOW (ref 12.0–15.0)
MCH: 36.2 pg — ABNORMAL HIGH (ref 26.0–34.0)
MCHC: 31 g/dL (ref 30.0–36.0)
MCV: 116.7 fL — ABNORMAL HIGH (ref 80.0–100.0)
Platelets: 273 10*3/uL (ref 150–400)
RBC: 2.46 MIL/uL — ABNORMAL LOW (ref 3.87–5.11)
RDW: 16.3 % — ABNORMAL HIGH (ref 11.5–15.5)
WBC: 7.8 10*3/uL (ref 4.0–10.5)
nRBC: 0 % (ref 0.0–0.2)

## 2022-06-14 LAB — ECHOCARDIOGRAM COMPLETE
Area-P 1/2: 4.54 cm2
Height: 60 in
MV M vel: 3.05 m/s
MV Peak grad: 37.2 mmHg
S' Lateral: 2 cm
Weight: 1315.71 oz

## 2022-06-14 LAB — HEMOGLOBIN A1C
Hgb A1c MFr Bld: 7.6 % — ABNORMAL HIGH (ref 4.8–5.6)
Mean Plasma Glucose: 171 mg/dL

## 2022-06-14 LAB — MAGNESIUM: Magnesium: 1.6 mg/dL — ABNORMAL LOW (ref 1.7–2.4)

## 2022-06-14 MED ORDER — MAGNESIUM SULFATE 2 GM/50ML IV SOLN
2.0000 g | Freq: Once | INTRAVENOUS | Status: AC
  Administered 2022-06-14: 2 g via INTRAVENOUS
  Filled 2022-06-14: qty 50

## 2022-06-14 MED ORDER — POTASSIUM CHLORIDE 20 MEQ PO PACK
40.0000 meq | PACK | ORAL | Status: AC
  Administered 2022-06-14 (×2): 40 meq via ORAL
  Filled 2022-06-14 (×2): qty 2

## 2022-06-14 NOTE — Progress Notes (Signed)
Rounding Note    Patient Name: [name] Date of Encounter: 06/14/2022  [place] Cardiologist: Kirk Ruths, MD   Subjective   No SOB  No CP   Shoulders hurt    Inpatient Medications    Scheduled Meds:  amiodarone  200 mg Oral Daily   apixaban  2.5 mg Oral BID   atorvastatin  40 mg Oral Daily   docusate  200 mg Oral BID   empagliflozin  10 mg Oral Daily   ezetimibe  10 mg Oral Daily   insulin aspart  0-9 Units Subcutaneous TID WC   insulin glargine-yfgn  10 Units Subcutaneous QHS   latanoprost  1 drop Both Eyes QHS   metoprolol tartrate  50 mg Oral BID   omega-3 acid ethyl esters  1,000 mg Oral Daily   pantoprazole  40 mg Oral Daily   Continuous Infusions:  PRN Meds: acetaminophen **OR** acetaminophen, diclofenac Sodium, hydrALAZINE, levalbuterol, ondansetron **OR** ondansetron (ZOFRAN) IV   Vital Signs    Vitals:   06/13/22 2300 06/14/22 0100 06/14/22 0536 06/14/22 0700  BP: (!) 162/79 (!) 140/66    Pulse: 77 64    Resp:      Temp:   98.4 F (36.9 C)   TempSrc:   Oral   SpO2: 97% 98%    Weight:    37.3 kg  Height:        Intake/Output Summary (Last 24 hours) at 06/14/2022 1057 Last data filed at 06/14/2022 0848 Gross per 24 hour  Intake 240 ml  Output 1500 ml  Net -1260 ml      06/14/2022    7:00 AM 06/13/2022    5:00 AM 06/12/2022    9:43 PM  Last 3 Weights  Weight (lbs) 82 lb 3.7 oz 85 lb 15.7 oz 90 lb 13.3 oz  Weight (kg) 37.3 kg 39 kg 41.2 kg      Telemetry    SR  - Personally Reviewed  ECG    No new  - Personally Reviewed  Physical Exam   GEN: Petite 89 yo in NAD  Neck: No JVD Cardiac: RRR, no murmurs,  Respiratory: Clear to auscultation bilaterally. GI: Soft, nontender, non-distended  MS: No edema;  Labs    High Sensitivity Troponin:   Recent Labs  Lab 06/12/22 1248 06/12/22 1507  TROPONINIHS 14 13     Chemistry Recent Labs  Lab 06/12/22 1248 06/13/22 0448 06/14/22 0546  NA 135 136 141  K 4.6 3.8  2.9*  CL 106 104 103  CO2 19* 23 27  GLUCOSE 228* 355* 209*  BUN 40* 35* 33*  CREATININE 1.25* 1.20* 1.29*  CALCIUM 9.3 8.9 8.9  MG  --   --  1.6*  PROT  --  6.4*  --   ALBUMIN  --  3.4*  --   AST  --  38  --   ALT  --  66*  --   ALKPHOS  --  80  --   BILITOT  --  0.8  --   GFRNONAA 42* 44* 40*  ANIONGAP '10 9 11    '$ Lipids No results for input(s): "CHOL", "TRIG", "HDL", "LABVLDL", "LDLCALC", "CHOLHDL" in the last 168 hours.  Hematology Recent Labs  Lab 06/12/22 1248 06/13/22 0448 06/14/22 0546  WBC 7.1 6.8 7.8  RBC 2.67* 2.43* 2.46*  HGB 9.7* 8.7* 8.9*  HCT 29.8* 26.6* 28.7*  MCV 111.6* 109.5* 116.7*  MCH 36.3* 35.8* 36.2*  MCHC 32.6 32.7 31.0  RDW 15.9* 16.0* 16.3*  PLT 314 291 273   Thyroid No results for input(s): "TSH", "FREET4" in the last 168 hours.  BNP Recent Labs  Lab 06/12/22 1248  BNP 1,283.1*    DDimer No results for input(s): "DDIMER" in the last 168 hours.   Radiology    DG CHEST PORT 1 VIEW  Result Date: 06/13/2022 CLINICAL DATA:  Dyspnea. EXAM: PORTABLE CHEST 1 VIEW COMPARISON:  June 12, 2022. FINDINGS: Stable cardiomediastinal silhouette. Stable bilateral pulmonary edema is noted with associated pleural effusions. Bony thorax is unremarkable. IMPRESSION: Stable bilateral pulmonary edema with associated pleural effusions. Electronically Signed   By: Marijo Conception M.D.   On: 06/13/2022 09:49   CT Angio Chest PE W and/or Wo Contrast  Result Date: 06/12/2022 CLINICAL DATA:  Pulmonary embolism suspected.  High probability. EXAM: CT ANGIOGRAPHY CHEST WITH CONTRAST TECHNIQUE: Multidetector CT imaging of the chest was performed using the standard protocol during bolus administration of intravenous contrast. Multiplanar CT image reconstructions and MIPs were obtained to evaluate the vascular anatomy. RADIATION DOSE REDUCTION: This exam was performed according to the departmental dose-optimization program which includes automated exposure control,  adjustment of the mA and/or kV according to patient size and/or use of iterative reconstruction technique. CONTRAST:  30m OMNIPAQUE IOHEXOL 350 MG/ML SOLN COMPARISON:  Chest radiography same day FINDINGS: Cardiovascular: Mild cardiomegaly. Coronary artery calcification and aortic atherosclerotic calcification are present. Pulmonary arterial opacification is excellent. There are no pulmonary emboli. Mediastinum/Nodes: No mediastinal or hilar mass or lymphadenopathy. Lungs/Pleura: Bilateral pleural effusions layering dependently with dependent pulmonary atelectasis, particularly pronounced in the lower lobes. Mild edema within the expanded lung. Findings most consistent with heart failure. Upper Abdomen: Negative Musculoskeletal: Ordinary mild thoracic degenerative changes. Review of the MIP images confirms the above findings. IMPRESSION: 1. No pulmonary emboli. 2. Mild cardiomegaly. Coronary artery calcification and aortic atherosclerotic calcification. 3. Bilateral pleural effusions layering dependently with dependent pulmonary atelectasis, particularly pronounced in the lower lobes. Mild edema within the expanded lung. Findings most consistent with heart failure. 4. Aortic atherosclerosis. Aortic Atherosclerosis (ICD10-I70.0). Electronically Signed   By: MNelson ChimesM.D.   On: 06/12/2022 16:36   DG Chest Portable 1 View  Result Date: 06/12/2022 CLINICAL DATA:  Chest pain and shortness of breath. EXAM: PORTABLE CHEST 1 VIEW COMPARISON:  Chest x-ray dated March 15, 2022. FINDINGS: Unchanged borderline cardiomegaly. New diffuse interstitial thickening with layering bilateral pleural effusions and bibasilar atelectasis. No pneumothorax. No acute osseous abnormality. IMPRESSION: 1. New congestive heart failure. Electronically Signed   By: WTitus DubinM.D.   On: 06/12/2022 13:24    Cardiac Studies   Echo ordered   TTE 02/14/2020: Impressions: 1. Left ventricular ejection fraction, by estimation, is 50  to 55%. The  left ventricle has low normal function. The left ventricle has no regional  wall motion abnormalities. Indeterminate diastolic filling due to E-A  fusion.   2. Right ventricular systolic function is mildly reduced. The right  ventricular size is normal. There is normal pulmonary artery systolic  pressure.   3. The mitral valve is normal in structure. Trivial mitral valve  regurgitation.   4. The aortic valve was not well visualized. Aortic valve regurgitation  is not visualized.    Patient Profile   BCHERYLANNE FAIRLESSis a 89y.o. female with a hx of HTN, HLD, DM II, CVA, DVT, PAD and PAF/atrial flutter who is being seen 06/13/2022 for the evaluation of new CHF at the request of Dr. PLouanne Belton  Assessment & Plan    1  Volume overload  Pt diuresed   Overall volume looks OK  Echo is pending  NO changes for now     2  HTN  BP is a little high   She was on metoprolol 50 bid, lisinopril 20 daily and dilt CD 240 daily at home   Lisinopril held with Cr up     DIlt held until echo back   FOllow for now    3 Hx PAF   Currently in SR  On amiodarone and metoprolol    On eliquis 2.5 bid    4  Hx DVT  5   Hx CVA  Keep on Eliquis  6   REnal   Cr 1.2  Baseline 0.8  Follow      Ambulate and follow BP and HR    For questions or updates, please contact [place] Please consult [email or web address] for contact info under        Signed, Dorris Carnes, MD  06/14/2022, 10:57 AM

## 2022-06-14 NOTE — Plan of Care (Signed)
  Problem: Clinical Measurements: Goal: Will remain free from infection Outcome: Progressing Goal: Diagnostic test results will improve Outcome: Progressing   Problem: Nutrition: Goal: Adequate nutrition will be maintained Outcome: Progressing   

## 2022-06-14 NOTE — Progress Notes (Signed)
  Echocardiogram 2D Echocardiogram has been performed.  [name] 06/14/2022, 3:40 PM

## 2022-06-14 NOTE — TOC Progression Note (Addendum)
Transition of Care [facility]) - Progression Note    Patient Details  Name: [name] MRN: [id number] Date of Birth: 1932/08/13  [facility]) CM/SW Contact  Kendon Sedeno, Juliann Pulse, RN Phone Number: 06/14/2022, 12:27 PM  Clinical Narrative: [facility] rep Calvin following for HHPT/OT-order placed, will need face to face order. [employer] rep [name] following if home 02 needed-await 02 sats, & home 02 order if appropriate to deliver home 02 travel tank to rm prior d/c. Has own transportation.      Expected Discharge Plan: [place] Barriers to Discharge: Continued Medical Work up  Expected Discharge Plan and Services   Discharge Planning Services: CM Consult Post Acute Care Choice: [place] arrangements for the past 2 months: Single Family Home                           HH Arranged: PT, OT HH Agency: [facility] Date [place]: 06/14/22 Time [place]: 1227 Representative spoke with at [place]: [place] Determinants of Health ([place]) Interventions SDOH Screenings   Food Insecurity: No Food Insecurity (06/12/2022)  Housing: Low Risk  (06/12/2022)  Transportation Needs: No Transportation Needs (06/12/2022)  Utilities: Not At Risk (06/12/2022)  Alcohol Screen: Low Risk  (06/13/2021)  Depression (PHQ2-9): Low Risk  (06/13/2021)  Financial Resource Strain: Low Risk  (01/31/2020)  Physical Activity: Sufficiently Active (01/31/2020)  Stress: No Stress Concern Present (08/30/2019)  Tobacco Use: Low Risk  (06/12/2022)    Readmission Risk Interventions     No data to display

## 2022-06-14 NOTE — Progress Notes (Signed)
PROGRESS NOTE    [name]  [id number] DOB: 18-Jun-1932 DOA: 06/12/2022 PCP: Lucianne Lei, MD    Brief Narrative:   [name] is a 89 y.o. female with past medical history of atrial fibrillation, type 2 diabetes, deep vein thrombosis, hyperlipidemia, hypertension, peripheral vascular disease, history of CVA was brought into the hospital for acute chest pain with shortness of breath and weakness.  Shortness of breath for 1 week and chest pain for 1 day.  In the ED patient was noted to be hypertensive and was hypoxic with pulse ox of 81% on room air and was placed on 3 L of oxygen.  Initial hemoglobin was 9.7.  EKG showed normal sinus rhythm.  Creatinine was elevated at 1.2 from baseline 0.8.  BNP was elevated at 1283.  CT scan of the chest showed no evidence of pulmonary embolism but bilateral pleural effusion and cardiomegaly.  Patient was given 40 mg of IV Lasix and was considered for admission to the hospital for further evaluation and treatment.    06/14/2022: Patient seen.  Cardiology input is appreciated.  Patient is slowly improving.  Patient is not a particularly good historian.   Assessment/Plan   CHF with acute hypoxic respiratory failure.   Imaging showed pleural effusion.Patient is currently on 6 L of oxygen by high flow nasal cannula.  Likely decompensated congestive heart failure secondary to uncontrolled hypertension.  On IV Lasix with significant diuresis.  Creatinine at 1.2.  EKG unremarkable.  Check 2D echocardiogram.  Strict intake and output charting.  Daily weights.  Will follow cardiology recommendation.  Patient is negative balance for 2560 mL so far.  06/14/2022: Cardiology is directing care.  CHF is improving.  Will follow echo result.  Hypertensive Urgency  Received Nitropaste.  Continue metoprolol.  Patient was on lisinopril at home as well.  06/14/2022: Blood pressure control has improved significantly.  Last blood pressure documented was 122/59 mmHg.     Paroxysmal atrial fibrillation  Patient is on amiodarone Cardizem metoprolol and lisinopril at home.  Continue amiodarone, metoprolol and Eliquis.  Currently rate controlled.   Diabetes mellitus type 2. Continue sliding-scale insulin.  Resume Lantus.  Closely monitor blood glucose levels.  Will put the patient on diabetic diet.    History of DVT continue Eliquis    Hyperlipidemia Continue ezetimibe   PVD On Eliquis.   CVA Secondary prophylaxis with Eliquis.  Deconditioning, Debility.  Will get PT evaluation.       DVT prophylaxis: apixaban (ELIQUIS) tablet 2.5 mg Start: 06/13/22 1000 apixaban (ELIQUIS) tablet 2.5 mg   Code Status:     Code Status: Full Code  Disposition: Home likely in 1-2 days.  Status is: Inpatient  Remains inpatient appropriate because: Decompensated heart failure, acute hypoxic respiratory failure   Family Communication:    Consultants:  Cardiology  Procedures:  None  Antimicrobials:  None  Anti-infectives (From admission, onward)    None      Subjective: -No shortness of breath -No chest pain -Not a particularly good historian.  Objective: Vitals:   06/14/22 0100 06/14/22 0536 06/14/22 0700 06/14/22 1345  BP: (!) 140/66   (!) 122/59  Pulse: 64   64  Resp:    16  Temp:  98.4 F (36.9 C)  97.6 F (36.4 C)  TempSrc:  Oral  Oral  SpO2: 98%   100%  Weight:   37.3 kg   Height:        Intake/Output Summary (Last 24 hours) at 06/14/2022  1610 Last data filed at 06/14/2022 0848 Gross per 24 hour  Intake 120 ml  Output 1500 ml  Net -1380 ml    Filed Weights   06/12/22 2143 06/13/22 0500 06/14/22 0700  Weight: 41.2 kg 39 kg 37.3 kg    Physical Examination: Body mass index is 16.06 kg/m.   General: Sleepy.  Not in any distress.  Patient is thin.   HEENT:   Patient is pale.ral pallor or icterus noted. .  Chest: Clear to auscultation anteriorly. CVS: S1 &S2 heard.  Abdomen: Soft, nontender, nondistended.  Bowel sounds  are heard.   Extremities: No leg edema..  Data Reviewed:   CBC: Recent Labs  Lab 06/12/22 1248 06/13/22 0448 06/14/22 0546  WBC 7.1 6.8 7.8  HGB 9.7* 8.7* 8.9*  HCT 29.8* 26.6* 28.7*  MCV 111.6* 109.5* 116.7*  PLT 314 291 273     Basic Metabolic Panel: Recent Labs  Lab 06/12/22 1248 06/13/22 0448 06/14/22 0546  NA 135 136 141  K 4.6 3.8 2.9*  CL 106 104 103  CO2 19* 23 27  GLUCOSE 228* 355* 209*  BUN 40* 35* 33*  CREATININE 1.25* 1.20* 1.29*  CALCIUM 9.3 8.9 8.9  MG  --   --  1.6*     Liver Function Tests: Recent Labs  Lab 06/13/22 0448  AST 38  ALT 66*  ALKPHOS 80  BILITOT 0.8  PROT 6.4*  ALBUMIN 3.4*      Radiology Studies: DG CHEST PORT 1 VIEW  Result Date: 06/13/2022 CLINICAL DATA:  Dyspnea. EXAM: PORTABLE CHEST 1 VIEW COMPARISON:  June 12, 2022. FINDINGS: Stable cardiomediastinal silhouette. Stable bilateral pulmonary edema is noted with associated pleural effusions. Bony thorax is unremarkable. IMPRESSION: Stable bilateral pulmonary edema with associated pleural effusions. Electronically Signed   By: Marijo Conception M.D.   On: 06/13/2022 09:49   CT Angio Chest PE W and/or Wo Contrast  Result Date: 06/12/2022 CLINICAL DATA:  Pulmonary embolism suspected.  High probability. EXAM: CT ANGIOGRAPHY CHEST WITH CONTRAST TECHNIQUE: Multidetector CT imaging of the chest was performed using the standard protocol during bolus administration of intravenous contrast. Multiplanar CT image reconstructions and MIPs were obtained to evaluate the vascular anatomy. RADIATION DOSE REDUCTION: This exam was performed according to the departmental dose-optimization program which includes automated exposure control, adjustment of the mA and/or kV according to patient size and/or use of iterative reconstruction technique. CONTRAST:  46m OMNIPAQUE IOHEXOL 350 MG/ML SOLN COMPARISON:  Chest radiography same day FINDINGS: Cardiovascular: Mild cardiomegaly. Coronary artery  calcification and aortic atherosclerotic calcification are present. Pulmonary arterial opacification is excellent. There are no pulmonary emboli. Mediastinum/Nodes: No mediastinal or hilar mass or lymphadenopathy. Lungs/Pleura: Bilateral pleural effusions layering dependently with dependent pulmonary atelectasis, particularly pronounced in the lower lobes. Mild edema within the expanded lung. Findings most consistent with heart failure. Upper Abdomen: Negative Musculoskeletal: Ordinary mild thoracic degenerative changes. Review of the MIP images confirms the above findings. IMPRESSION: 1. No pulmonary emboli. 2. Mild cardiomegaly. Coronary artery calcification and aortic atherosclerotic calcification. 3. Bilateral pleural effusions layering dependently with dependent pulmonary atelectasis, particularly pronounced in the lower lobes. Mild edema within the expanded lung. Findings most consistent with heart failure. 4. Aortic atherosclerosis. Aortic Atherosclerosis (ICD10-I70.0). Electronically Signed   By: MNelson ChimesM.D.   On: 06/12/2022 16:36      LOS: 2 days    SBonnell Public MD [facility] Available via Epic secure chat 7am-7pm After these hours, please refer to coverage provider listed  on [email or web address] 06/14/2022, 4:10 PM

## 2022-06-15 DIAGNOSIS — I160 Hypertensive urgency: Secondary | ICD-10-CM

## 2022-06-15 DIAGNOSIS — I5033 Acute on chronic diastolic (congestive) heart failure: Secondary | ICD-10-CM | POA: Diagnosis not present

## 2022-06-15 DIAGNOSIS — N179 Acute kidney failure, unspecified: Secondary | ICD-10-CM | POA: Diagnosis not present

## 2022-06-15 DIAGNOSIS — I4891 Unspecified atrial fibrillation: Secondary | ICD-10-CM | POA: Diagnosis not present

## 2022-06-15 LAB — RENAL FUNCTION PANEL
Albumin: 3.4 g/dL — ABNORMAL LOW (ref 3.5–5.0)
Anion gap: 9 (ref 5–15)
BUN: 33 mg/dL — ABNORMAL HIGH (ref 8–23)
CO2: 27 mmol/L (ref 22–32)
Calcium: 8.8 mg/dL — ABNORMAL LOW (ref 8.9–10.3)
Chloride: 106 mmol/L (ref 98–111)
Creatinine, Ser: 1.25 mg/dL — ABNORMAL HIGH (ref 0.44–1.00)
GFR, Estimated: 42 mL/min — ABNORMAL LOW (ref >60)
Glucose, Bld: 83 mg/dL (ref 70–99)
Phosphorus: 4.5 mg/dL (ref 2.5–4.6)
Potassium: 3.8 mmol/L (ref 3.5–5.1)
Sodium: 142 mmol/L (ref 135–145)

## 2022-06-15 LAB — GLUCOSE, CAPILLARY
Glucose-Capillary: 161 mg/dL — ABNORMAL HIGH (ref 70–99)
Glucose-Capillary: 66 mg/dL — ABNORMAL LOW (ref 70–99)
Glucose-Capillary: 368 mg/dL — ABNORMAL HIGH (ref 70–99)
Glucose-Capillary: 228 mg/dL — ABNORMAL HIGH (ref 70–99)

## 2022-06-15 LAB — MAGNESIUM: Magnesium: 2.8 mg/dL — ABNORMAL HIGH (ref 1.7–2.4)

## 2022-06-15 MED ORDER — AMLODIPINE BESYLATE 5 MG PO TABS: 5.0000 mg | ORAL_TABLET | Freq: Every day | ORAL | 0 refills | Status: DC

## 2022-06-15 MED ORDER — ACETAMINOPHEN 325 MG PO TABS: 650.0000 mg | ORAL_TABLET | Freq: Four times a day (QID) | ORAL | 0 refills | Status: AC | PRN

## 2022-06-15 MED ORDER — METFORMIN HCL 500 MG PO TABS: 500.0000 mg | ORAL_TABLET | Freq: Two times a day (BID) | ORAL | 1 refills | Status: AC

## 2022-06-15 MED ORDER — FUROSEMIDE 20 MG PO TABS
20.0000 mg | ORAL_TABLET | Freq: Every day | ORAL | Status: DC
  Administered 2022-06-15: 20 mg via ORAL
  Filled 2022-06-15: qty 1

## 2022-06-15 MED ORDER — AMLODIPINE BESYLATE 5 MG PO TABS
5.0000 mg | ORAL_TABLET | Freq: Every day | ORAL | Status: DC
  Administered 2022-06-15: 5 mg via ORAL
  Filled 2022-06-15: qty 1

## 2022-06-15 MED ORDER — FUROSEMIDE 20 MG PO TABS: 20.0000 mg | ORAL_TABLET | Freq: Every day | ORAL | 0 refills | Status: DC

## 2022-06-15 MED ORDER — AMIODARONE HCL 200 MG PO TABS: 200.0000 mg | ORAL_TABLET | Freq: Every day | ORAL | 0 refills | Status: DC

## 2022-06-15 MED ORDER — LANTUS SOLOSTAR 100 UNIT/ML ~~LOC~~ SOPN: 10.0000 [IU] | PEN_INJECTOR | Freq: Every day | SUBCUTANEOUS | Status: DC

## 2022-06-15 MED ORDER — DOCUSATE SODIUM 50 MG/5ML PO LIQD: 200.0000 mg | Freq: Two times a day (BID) | ORAL | 0 refills | Status: DC

## 2022-06-15 NOTE — Progress Notes (Signed)
Hypoglycemic Event  CBG: 66  Treatment: 4 oz juice/soda  Symptoms: None  Follow-up CBG: Time:0808  CBG Result:161  Possible Reasons for Event: Unknown  Comments - NT advised RN of patient's hypoglycemia and NT gave patient orange juice.      Doretha Sou A

## 2022-06-15 NOTE — TOC Transition Note (Addendum)
Transition of Care [facility]) - CM/SW Discharge Note   Patient Details  Name: [name] MRN: [id number] Date of Birth: [birth year]  Transition of Care [facility]) CM/SW Contact:  Henrietta Dine, RN Phone Number: 06/15/2022, 4:03 PM   Clinical Narrative:    Orders received for home oxygen; pt not oriented x 4; called pt's dtr [name] [phone]) and she agrees to service; she does not have an agency preference; explained travel tank will be delivered to room, and contact information for agency will be placed in follow up provider section of d/c instructions; she verbalized understanding and provided pt's d/c address [address] [phone]; [employer] previously following for needs; spoke w/ [name] at agency and they can provide service to pt; HHPT/OT previously set up w/ [facility]; attempted to notify Calvin at agency but VM not set up; attempted to notify Chrissie Noa; unable to LVM because mailbox is full; SMS messages sent to both; no TOC needs.   -[id number] Dory from [facility] confirmed receipt of message.  Final next level of care: Home/Self Care Barriers to Discharge: No Barriers Identified   Patient Goals and CMS Choice CMS Medicare.gov Compare Post Acute Care list provided to:: Patient Represenative (must comment) Engineer, production))    Discharge Placement                         Discharge Plan and Services Additional resources added to the After Visit Summary for     Discharge Planning Services: CM Consult Post Acute Care Choice: Durable Medical Equipment          DME Arranged: Oxygen DME Agency: [employer] Date DME Agency Contacted: 06/15/22 Time DME Agency Contacted: [id number] Representative spoke with at DME Agency: Brenton Grills HH Arranged: PT, OT [place] Agency: [facility] [place] Date [place]: 06/14/22 Time [place]: 1227 Representative spoke with at [place]: [place] Determinants of Health ([place]) Interventions SDOH  Screenings   Food Insecurity: No Food Insecurity (06/12/2022)  Housing: Low Risk  (06/12/2022)  Transportation Needs: No Transportation Needs (06/12/2022)  Utilities: Not At Risk (06/12/2022)  Alcohol Screen: Low Risk  (06/13/2021)  Depression (PHQ2-9): Low Risk  (06/13/2021)  Financial Resource Strain: Low Risk  (01/31/2020)  Physical Activity: Sufficiently Active (01/31/2020)  Stress: No Stress Concern Present (08/30/2019)  Tobacco Use: Low Risk  (06/12/2022)     Readmission Risk Interventions     No data to display

## 2022-06-15 NOTE — Discharge Summary (Signed)
Physician Discharge Summary  Patient ID: [name] MRN: [id number] DOB/AGE: 87-Nov-1934 87 y.o.  Admit date: 06/12/2022 Discharge date: 06/15/2022  Admission Diagnoses:  Discharge Diagnoses:  Principal Problem:   Acute on chronic diastolic congestive heart failure.   Active Problems:   Type 2 diabetes mellitus with hyperlipidemia (HCC)   Pure hypercholesterolemia   Essential hypertension, malignant   ATRIAL FIBRILLATION   Dyspnea   AKI (acute kidney injury) ([place])   Hypertensive urgency   Discharged Condition: stable  Hospital Course: Patient is an 89 year old African-American female with past medical history significant for atrial fibrillation, type 2 diabetes, deep vein thrombosis, hyperlipidemia, hypertension, peripheral vascular disease, history of CVA.  Patient was admitted with acute chest pain, shortness of breath and weakness.  On presentation to the emergency room, patient was ED patient was hypertensive and hypoxic with pulse ox of 81% on room air and was placed on 3 L of oxygen.  Initial hemoglobin was 9.7.  EKG showed normal sinus rhythm.  Creatinine was elevated at 1.2 from baseline 0.8.  BNP was elevated at 1283.  CT scan of the chest showed no evidence of pulmonary embolism but bilateral pleural effusion and cardiomegaly.  Patient was given 40 mg of IV Lasix.  Patient was admitted and managed for acute on chronic diastolic congestive heart failure.  Cardiology team was consulted to assist with patient's management.  Patient has been cleared for discharge by the cardiology team.    Acute on chronic diastolic CHF with acute hypoxic respiratory failure:   -Patient was admitted and managed for acute on chronic diastolic CHF. -Cardiology team was consulted to assist with patient's management. -Echo finding is as documented above. -Patient was initially on IV diuretics, been transitioned to oral Lasix.   -Patient has been cleared for discharge by the cardiology team.    -Patient be discharged on supplemental oxygen 1 L/min via nasal cannula.     Hypertensive Urgency: -Blood pressure has been gradually controlled.   -Goal blood pressure should be less than 130/80 mmHg if tolerated.      Paroxysmal atrial fibrillation  -Patient was on amiodarone Cardizem metoprolol and lisinopril at home.  -Continue amiodarone, metoprolol and Eliquis.   -Patient is currently rate controlled.   Diabetes mellitus type 2. Continue sliding-scale insulin.  Resume Lantus.  Closely monitor blood glucose levels.  Will put the patient on diabetic diet.    History of DVT continue Eliquis    Hyperlipidemia Continue ezetimibe   PVD On Eliquis.   CVA Secondary prophylaxis with Eliquis.   Deconditioning, Debility.  Will get PT evaluation.  Consults: cardiology  Significant Diagnostic Studies:  Echocardiogram revealed:  1. Left ventricular ejection fraction, by estimation, is 50 to 55%. The  left ventricle has low normal function. The left ventricle has no regional  wall motion abnormalities. Left ventricular diastolic parameters are  indeterminate.   2. Right ventricular systolic function is low normal. The right  ventricular size is normal.   3. The mitral valve is degenerative. Mild mitral valve regurgitation. No  evidence of mitral stenosis.   4. No systolic flow reversal of IVC spectral doppler. Tricuspid valve  regurgitation is moderate.   5. The aortic valve was not well visualized. Aortic valve regurgitation  is not visualized.   Comparison(s): Tricuspid regurgitation has increased.     Discharge Exam: Blood pressure (!) 127/55, pulse 69, temperature 97.6 F (36.4 C), temperature source Oral, resp. rate 16, height 5' (1.524 m), weight 43.6 kg, SpO2 90 %.  Disposition: Discharge disposition: 06-Home-Health Care Svc       Discharge Instructions     Diet - low sodium heart healthy   Complete by: As directed    Increase activity slowly   Complete  by: As directed       Allergies as of 06/15/2022   No Known Allergies      Medication List     STOP taking these medications    acetaminophen 650 MG CR tablet Commonly known as: TYLENOL Replaced by: acetaminophen 325 MG tablet   diltiazem 240 MG 24 hr capsule Commonly known as: CARDIZEM CD   lisinopril 20 MG tablet Commonly known as: ZESTRIL   ondansetron 4 MG tablet Commonly known as: ZOFRAN       TAKE these medications    Accu-Chek Softclix Lancets lancets   acetaminophen 325 MG tablet Commonly known as: TYLENOL Take 2 tablets (650 mg total) by mouth every 6 (six) hours as needed for mild pain (or Fever >/= 101). Replaces: acetaminophen 650 MG CR tablet   amiodarone 200 MG tablet Commonly known as: PACERONE Take 1 tablet (200 mg total) by mouth daily. Start taking on: June 16, 2022 What changed: See the new instructions.   amLODipine 5 MG tablet Commonly known as: NORVASC Take 1 tablet (5 mg total) by mouth daily. Start taking on: June 16, 2022   atorvastatin 40 MG tablet Commonly known as: LIPITOR Take 1 tablet (40 mg total) by mouth daily.   diclofenac Sodium 1 % Gel Commonly known as: VOLTAREN Apply 2 g topically 2 (two) times daily as needed (pain).   docusate 50 MG/5ML liquid Commonly known as: COLACE Take 20 mLs (200 mg total) by mouth 2 (two) times daily.   Eliquis 2.5 MG Tabs tablet Generic drug: apixaban TAKE 1 TABLET BY MOUTH TWICE A DAY What changed: how much to take   Entresto 24-26 MG Generic drug: sacubitril-valsartan Take 1 tablet by mouth 2 (two) times daily.   ezetimibe 10 MG tablet Commonly known as: ZETIA Take 1 tablet (10 mg total) by mouth daily.   Fish Oil 1200 MG Caps Take 1,200 mg by mouth daily.   furosemide 20 MG tablet Commonly known as: LASIX Take 1 tablet (20 mg total) by mouth daily. Start taking on: June 16, 2022   glipiZIDE 10 MG tablet Commonly known as: GLUCOTROL Take 10 mg by mouth 2 (two) times  daily.   Lantus SoloStar 100 UNIT/ML Solostar Pen Generic drug: insulin glargine Inject 10 Units into the skin at bedtime.   latanoprost 0.005 % ophthalmic solution Commonly known as: XALATAN Place 1 drop into both eyes at bedtime.   metFORMIN 500 MG tablet Commonly known as: GLUCOPHAGE Take 1 tablet (500 mg total) by mouth 2 (two) times daily. What changed: how much to take   metoprolol tartrate 50 MG tablet Commonly known as: LOPRESSOR TAKE 1 TABLET BY MOUTH TWICE A DAY What changed:  how much to take how to take this when to take this additional instructions   multivitamin tablet Take 1 tablet by mouth daily. Centrum silver   pantoprazole 40 MG tablet Commonly known as: PROTONIX Take 40 mg by mouth daily.               Durable Medical Equipment  (From admission, onward)           Start     Ordered   06/15/22 1423  DME Oxygen  Once       Question Answer Comment  Length of Need 6 Months   Mode or (Route) Nasal cannula   Liters per Minute 1   Oxygen delivery system Gas      06/15/22 [place], [facility] Follow up.   Specialty: [place] Why: [facility] physical therapy/occupational therapy Contact information: [address] [place] [address] [phone]                  Time spent: 36 minutes.   SignedBonnell Public 06/15/2022, 2:24 PM

## 2022-06-15 NOTE — Progress Notes (Signed)
Rounding Note    Patient Name: [name] Date of Encounter: 06/15/2022  [place] Cardiologist: Kirk Ruths, MD   Subjective    Incomplete I/Os.  BP 168/70.  Stable Cr (1.25>1.20>1.29>1.25).  K improved (2.9>3.8).  Did not received IV lasix yesterday.  Continues to have dyspnea, denies any chest pain.  Inpatient Medications    Scheduled Meds:  amiodarone  200 mg Oral Daily   apixaban  2.5 mg Oral BID   atorvastatin  40 mg Oral Daily   docusate  200 mg Oral BID   ezetimibe  10 mg Oral Daily   insulin aspart  0-9 Units Subcutaneous TID WC   insulin glargine-yfgn  10 Units Subcutaneous QHS   latanoprost  1 drop Both Eyes QHS   metoprolol tartrate  50 mg Oral BID   omega-3 acid ethyl esters  1,000 mg Oral Daily   pantoprazole  40 mg Oral Daily   Continuous Infusions:  PRN Meds: acetaminophen **OR** acetaminophen, diclofenac Sodium, hydrALAZINE, levalbuterol, ondansetron **OR** ondansetron (ZOFRAN) IV   Vital Signs    Vitals:   06/14/22 1954 06/15/22 0425 06/15/22 0800 06/15/22 0817  BP: (!) 140/64 (!) 168/70    Pulse: 67 (!) 57    Resp: 18 18    Temp: 98.1 F (36.7 C) 97.7 F (36.5 C)    TempSrc: Oral Oral    SpO2: 95% 93% 95% 95%  Weight:  43.6 kg    Height:        Intake/Output Summary (Last 24 hours) at 06/15/2022 0819 Last data filed at 06/15/2022 0746 Gross per 24 hour  Intake 646.14 ml  Output --  Net 646.14 ml       06/15/2022    4:25 AM 06/14/2022    7:00 AM 06/13/2022    5:00 AM  Last 3 Weights  Weight (lbs) 96 lb 1.9 oz 82 lb 3.7 oz 85 lb 15.7 oz  Weight (kg) 43.6 kg 37.3 kg 39 kg      Telemetry    SR  - Personally Reviewed  ECG    No new  - Personally Reviewed  Physical Exam   GEN: Petite 89 yo in NAD  Neck: No JVD Cardiac: RRR, no murmurs,  Respiratory: Clear to auscultation bilaterally. GI: Soft, nontender, non-distended  MS: No edema;  Labs    High Sensitivity Troponin:   Recent Labs  Lab 06/12/22 1248  06/12/22 1507  TROPONINIHS 14 13      Chemistry Recent Labs  Lab 06/13/22 0448 06/14/22 0546 06/15/22 0538  NA 136 141 142  K 3.8 2.9* 3.8  CL 104 103 106  CO2 '23 27 27  '$ GLUCOSE 355* 209* 83  BUN 35* 33* 33*  CREATININE 1.20* 1.29* 1.25*  CALCIUM 8.9 8.9 8.8*  MG  --  1.6* 2.8*  PROT 6.4*  --   --   ALBUMIN 3.4*  --  3.4*  AST 38  --   --   ALT 66*  --   --   ALKPHOS 80  --   --   BILITOT 0.8  --   --   GFRNONAA 44* 40* 42*  ANIONGAP '9 11 9     '$ Lipids No results for input(s): "CHOL", "TRIG", "HDL", "LABVLDL", "LDLCALC", "CHOLHDL" in the last 168 hours.  Hematology Recent Labs  Lab 06/12/22 1248 06/13/22 0448 06/14/22 0546  WBC 7.1 6.8 7.8  RBC 2.67* 2.43* 2.46*  HGB 9.7* 8.7* 8.9*  HCT 29.8* 26.6* 28.7*  MCV  111.6* 109.5* 116.7*  MCH 36.3* 35.8* 36.2*  MCHC 32.6 32.7 31.0  RDW 15.9* 16.0* 16.3*  PLT 314 291 273    Thyroid No results for input(s): "TSH", "FREET4" in the last 168 hours.  BNP Recent Labs  Lab 06/12/22 1248  BNP 1,283.1*     DDimer No results for input(s): "DDIMER" in the last 168 hours.   Radiology    ECHOCARDIOGRAM COMPLETE  Result Date: 06/14/2022    ECHOCARDIOGRAM REPORT   Patient Name:   [name] Date of Exam: 06/14/2022 Medical Rec #:  [id number]        Height:       60.0 in Accession #:    [id number]       Weight:       82.2 lb Date of Birth:  05-18-1932       BSA:          1.279 m Patient Age:    89 years         BP:           122/59 mmHg Patient Gender: F                HR:           64 bpm. Exam Location:  Inpatient Procedure: 2D Echo, Cardiac Doppler and Color Doppler Indications:    CHF- Acute Systolic  History:        Patient has prior history of Echocardiogram examinations, most                 recent 08/07/2021. Stroke and PAD, Arrythmias:Atrial                 Fibrillation; Risk Factors:Hypertension, Dyslipidemia and                 Diabetes.  Sonographer:    Eartha Inch Referring Phys: [id number] SARA-MAIZ A THOMAS   Sonographer Comments: Technically challenging study due to limited acoustic windows. Image acquisition challenging due to patient body habitus and Image acquisition challenging due to respiratory motion. IMPRESSIONS  1. Left ventricular ejection fraction, by estimation, is 50 to 55%. The left ventricle has low normal function. The left ventricle has no regional wall motion abnormalities. Left ventricular diastolic parameters are indeterminate.  2. Right ventricular systolic function is low normal. The right ventricular size is normal.  3. The mitral valve is degenerative. Mild mitral valve regurgitation. No evidence of mitral stenosis.  4. No systolic flow reversal of IVC spectral doppler. Tricuspid valve regurgitation is moderate.  5. The aortic valve was not well visualized. Aortic valve regurgitation is not visualized. Comparison(s): Tricuspid regurgitation has increased. FINDINGS  Left Ventricle: Left ventricular ejection fraction, by estimation, is 50 to 55%. The left ventricle has low normal function. The left ventricle has no regional wall motion abnormalities. The left ventricular internal cavity size was small. There is no left ventricular hypertrophy. Left ventricular diastolic parameters are indeterminate. Right Ventricle: The right ventricular size is normal. No increase in right ventricular wall thickness. Right ventricular systolic function is low normal. Left Atrium: Left atrial size was normal in size. Right Atrium: Right atrial size was normal in size. Pericardium: There is no evidence of pericardial effusion. Mitral Valve: The mitral valve is degenerative in appearance. Mild mitral annular calcification. Mild mitral valve regurgitation. No evidence of mitral valve stenosis. Tricuspid Valve: No systolic flow reversal of IVC spectral doppler. The tricuspid valve is grossly normal. Tricuspid valve regurgitation is moderate. Aortic Valve:  The aortic valve was not well visualized. Aortic valve  regurgitation is not visualized. Pulmonic Valve: The pulmonic valve was not well visualized. Pulmonic valve regurgitation is not visualized. No evidence of pulmonic stenosis. Aorta: The aortic root and ascending aorta are structurally normal, with no evidence of dilitation. IAS/Shunts: No atrial level shunt detected by color flow Doppler.  LEFT VENTRICLE PLAX 2D LVIDd:         2.70 cm   Diastology LVIDs:         2.00 cm   LV e' medial:    4.62 cm/s LV PW:         1.00 cm   LV E/e' medial:  17.8 LV IVS:        0.80 cm   LV e' lateral:   6.75 cm/s LVOT diam:     1.60 cm   LV E/e' lateral: 12.2 LV SV:         40 LV SV Index:   32 LVOT Area:     2.01 cm  RIGHT VENTRICLE            IVC RV S prime:     7.18 cm/s  IVC diam: 1.70 cm TAPSE (M-mode): 1.4 cm LEFT ATRIUM             Index        RIGHT ATRIUM           Index LA diam:        2.80 cm 2.19 cm/m   RA Area:     15.50 cm LA Vol (A2C):   23.1 ml 18.06 ml/m  RA Volume:   35.90 ml  28.06 ml/m LA Vol (A4C):   37.0 ml 28.92 ml/m LA Biplane Vol: 32.3 ml 25.25 ml/m  AORTIC VALVE LVOT Vmax:   78.30 cm/s LVOT Vmean:  59.700 cm/s LVOT VTI:    0.201 m  AORTA Ao Root diam: 2.30 cm Ao Asc diam:  2.80 cm MITRAL VALVE               TRICUSPID VALVE MV Area (PHT): 4.54 cm    TR Peak grad:   37.0 mmHg MV Decel Time: 167 msec    TR Mean grad:   27.0 mmHg MR Peak grad: 37.2 mmHg    TR Vmax:        304.00 cm/s MR Vmax:      305.00 cm/s  TR Vmean:       253.0 cm/s MV E velocity: 82.30 cm/s                            SHUNTS                            Systemic VTI:  0.20 m                            Systemic Diam: 1.60 cm Rudean Haskell MD Electronically signed by Rudean Haskell MD Signature Date/Time: 06/14/2022/4:39:19 PM    Final    DG CHEST PORT 1 VIEW  Result Date: 06/13/2022 CLINICAL DATA:  Dyspnea. EXAM: PORTABLE CHEST 1 VIEW COMPARISON:  June 12, 2022. FINDINGS: Stable cardiomediastinal silhouette. Stable bilateral pulmonary edema is noted with associated  pleural effusions. Bony thorax is unremarkable. IMPRESSION: Stable bilateral pulmonary edema with associated pleural effusions. Electronically Signed   By: Marijo Conception  M.D.   On: 06/13/2022 09:49    Cardiac Studies   Echo ordered   TTE 02/14/2020: Impressions: 1. Left ventricular ejection fraction, by estimation, is 50 to 55%. The  left ventricle has low normal function. The left ventricle has no regional  wall motion abnormalities. Indeterminate diastolic filling due to E-A  fusion.   2. Right ventricular systolic function is mildly reduced. The right  ventricular size is normal. There is normal pulmonary artery systolic  pressure.   3. The mitral valve is normal in structure. Trivial mitral valve  regurgitation.   4. The aortic valve was not well visualized. Aortic valve regurgitation  is not visualized.    Patient Profile   89 year old female with history of atrial fibrillation, CVA, DVT, PAD, hypertension, T2DM who presented with shortness of breath.  Assessment & Plan    Acute on chronic diastolic heart failure: Echo 06/14/2022 shows EF 50 to 55%, indeterminant diastolic function, low normal RV function, mild mitral regurgitation, moderate tricuspid regurgitation.  Presented with worsening shortness of breath.  BNP 1200.  Troponins negative.  CTPA showed no PE, bilateral pleural effusions. -Diuresed with IV lasix, but held yesterday.  Appears euvolemic.  IVC small/collapsible on echo yesterday.  Convert to PO lasix 20 mg daily  Hypertensive urgency: currently on metoprolol 50 mg twice daily.  BP elevated this morning.  Low resting HR, would hold off on home diltiazem.  Lisinopril held given AKI.  Will add amlodipine 5 mg daily.  Atrial fibrillation: On amiodarone 200 mg daily, Cardizem 240 mg daily, metoprolol 50 mg twice daily at home -Continue amiodarone and metoprolol -Continue Eliquis  AKI: Cr stable at 1.25 this AM (baseline 0.8).  Holding lisinopril   For questions  or updates, please contact [place] Please consult [email or web address] for contact info under        Signed, Donato Heinz, MD  06/15/2022, 8:19 AM

## 2022-06-15 NOTE — Progress Notes (Addendum)
SATURATION QUALIFICATIONS: (This note is used to comply with regulatory documentation for home oxygen)  Patient Saturations on Room Air at Rest = 94% When patient sitting up on room air, oxygen saturations 94%.   When patient laying back and resting on room air, oxygen saturations decrease to 88%  Patient Saturations on Room Air while Ambulating = 90%  Patient Saturations on 1 Liters of oxygen while Ambulating = 95%  Please briefly explain why patient needs home oxygen: patient's oxygen saturation levels decrease on room air to 87-88% when patient laying down and resting.  Patient able to maintain oxygen saturations greater than 90% on room air when patient is alert, awake and sitting up as well as while ambulating.

## 2022-06-17 DIAGNOSIS — N179 Acute kidney failure, unspecified: Secondary | ICD-10-CM | POA: Diagnosis not present

## 2022-06-17 DIAGNOSIS — E1151 Type 2 diabetes mellitus with diabetic peripheral angiopathy without gangrene: Secondary | ICD-10-CM | POA: Diagnosis not present

## 2022-06-17 DIAGNOSIS — I2510 Atherosclerotic heart disease of native coronary artery without angina pectoris: Secondary | ICD-10-CM | POA: Diagnosis not present

## 2022-06-17 DIAGNOSIS — J9601 Acute respiratory failure with hypoxia: Secondary | ICD-10-CM | POA: Diagnosis not present

## 2022-06-17 DIAGNOSIS — I110 Hypertensive heart disease with heart failure: Secondary | ICD-10-CM | POA: Diagnosis not present

## 2022-06-17 DIAGNOSIS — I480 Paroxysmal atrial fibrillation: Secondary | ICD-10-CM | POA: Diagnosis not present

## 2022-06-17 DIAGNOSIS — G309 Alzheimer's disease, unspecified: Secondary | ICD-10-CM | POA: Diagnosis not present

## 2022-06-17 DIAGNOSIS — J449 Chronic obstructive pulmonary disease, unspecified: Secondary | ICD-10-CM | POA: Diagnosis not present

## 2022-06-17 DIAGNOSIS — I5033 Acute on chronic diastolic (congestive) heart failure: Secondary | ICD-10-CM | POA: Diagnosis not present

## 2022-06-19 NOTE — Progress Notes (Signed)
HPI: FU paroxysmal atrial fibrillation, atrial flutter and prior CVA. She had a Myoview performed on Aug 27, 2005, that showed an ejection fraction of 80% and normal perfusion. Peripheral arteriogram March 2023 showed slow flow due to heart failure, small infrarenal abdominal aorta, greater than 50% right common femoral artery with multiple tandem stenosis in the SFA all less than 50%, greater than 80% left common femoral artery, occluded P1 segment of the popliteal artery.  Peripheral vascular disease is followed by vascular surgery.  Transesophageal echocardiogram April 2023 showed ejection fraction 50 to 55%, no left atrial appendage thrombus.  Recently discharged following admission for acute diastolic congestive heart failure.  Patient was diuresed with improvement.  She was also noted to have hypertensive urgency.  CTA February 2024 showed no pulmonary embolus but there was note of coronary calcification, pleural effusions bilaterally and mild edema.  Echocardiogram March 2024 showed normal LV function, mild mitral regurgitation, moderate tricuspid regurgitation.  Since I last saw her, her dyspnea has improved since she was discharged from the hospital.  There is no chest pain.  No syncope.  Minimal pedal edema.  Current Outpatient Medications  Medication Sig Dispense Refill   ACCU-CHEK SOFTCLIX LANCETS lancets      acetaminophen (TYLENOL) 325 MG tablet Take 2 tablets (650 mg total) by mouth every 6 (six) hours as needed for mild pain (or Fever >/= 101). 100 tablet 0   amiodarone (PACERONE) 200 MG tablet Take 1 tablet (200 mg total) by mouth daily. 30 tablet 0   amLODipine (NORVASC) 5 MG tablet Take 1 tablet (5 mg total) by mouth daily. 30 tablet 0   diclofenac Sodium (VOLTAREN) 1 % GEL Apply 2 g topically 2 (two) times daily as needed (pain).     docusate (COLACE) 50 MG/5ML liquid Take 20 mLs (200 mg total) by mouth 2 (two) times daily. 100 mL 0   ELIQUIS 2.5 MG TABS tablet TAKE 1 TABLET BY  MOUTH TWICE A DAY (Patient taking differently: Take 2.5 mg by mouth 2 (two) times daily.) 180 tablet 1   ENTRESTO 24-26 MG Take 1 tablet by mouth 2 (two) times daily.     ezetimibe (ZETIA) 10 MG tablet Take 1 tablet (10 mg total) by mouth daily. 90 tablet 3   furosemide (LASIX) 20 MG tablet Take 1 tablet (20 mg total) by mouth daily. 30 tablet 0   glipiZIDE (GLUCOTROL) 10 MG tablet Take 10 mg by mouth 2 (two) times daily.     LANTUS SOLOSTAR 100 UNIT/ML Solostar Pen Inject 10 Units into the skin at bedtime.     latanoprost (XALATAN) 0.005 % ophthalmic solution Place 1 drop into both eyes at bedtime.     metFORMIN (GLUCOPHAGE) 500 MG tablet Take 1 tablet (500 mg total) by mouth 2 (two) times daily. 60 tablet 1   metoprolol tartrate (LOPRESSOR) 50 MG tablet TAKE 1 TABLET BY MOUTH TWICE A DAY (Patient taking differently: Take 50 mg by mouth 2 (two) times daily.) 60 tablet 0   Multiple Vitamin (MULTIVITAMIN) tablet Take 1 tablet by mouth daily. Centrum silver     Omega-3 Fatty Acids (FISH OIL) 1200 MG CAPS Take 1,200 mg by mouth daily.     ondansetron (ZOFRAN) 4 MG tablet Take 4 mg by mouth every 8 (eight) hours as needed for nausea or vomiting.     pantoprazole (PROTONIX) 40 MG tablet Take 40 mg by mouth daily.     atorvastatin (LIPITOR) 40 MG tablet Take 1 tablet (  40 mg total) by mouth daily. 90 tablet 3   No current facility-administered medications for this visit.     Past Medical History:  Diagnosis Date   Arthritis    Atrial fibrillation ([place])    Diabetes mellitus    DVT (deep venous thrombosis) ([place])    Hyperlipidemia    Hypertension    Leg pain    Peripheral arterial disease ([place])    Stroke ([place]) 1999    Past Surgical History:  Procedure Laterality Date   ABDOMINAL AORTOGRAM W/LOWER EXTREMITY N/A 07/11/2021   Procedure: ABDOMINAL AORTOGRAM W/LOWER EXTREMITY;  Surgeon: Broadus John, MD;  Location: [place] CV LAB;  Service: Cardiovascular;  Laterality: N/A;   [place] N/A 02/15/2020   Procedure: CARDIOVERSION;  Surgeon: Acie Fredrickson, Wonda Cheng, MD;  Location: [facility] ENDOSCOPY;  Service: Cardiovascular;  Laterality: N/A;   CARDIOVERSION N/A 08/07/2021   Procedure: CARDIOVERSION;  Surgeon: Janina Mayo, MD;  Location: [facility] ENDOSCOPY;  Service: Cardiovascular;  Laterality: N/A;   CATARACT EXTRACTION  2000   CESAREAN SECTION     ENDARTERECTOMY FEMORAL Left 07/26/2021   Procedure: LEFT COMMON FEMORAL ENDARTERECTOMY;  Surgeon: Broadus John, MD;  Location: [place];  Service: Vascular;  Laterality: Left;   EYE SURGERY     FEMORAL BYPASS Left July 23, 2010   LUMBAR DISC SURGERY     PATCH ANGIOPLASTY Left 07/26/2021   Procedure: PATCH ANGIOPLASTY WITH XENOSURE BIOLOGIC 1 cm x 6 cm PATCH;  Surgeon: Broadus John, MD;  Location: [place];  Service: Vascular;  Laterality: Left;   SPINE SURGERY     TEE WITHOUT CARDIOVERSION N/A 08/07/2021   Procedure: TRANSESOPHAGEAL ECHOCARDIOGRAM (TEE);  Surgeon: Janina Mayo, MD;  Location: [facility] ENDOSCOPY;  Service: Cardiovascular;  Laterality: N/A;    Social History   Socioeconomic History   Marital status: Widowed    Spouse name: Not on file   Number of children: 1   Years of education: Not on file   Highest education level: Not on file  Occupational History   Occupation: Retired    Comment: Engineer, site  Tobacco Use   Smoking status: Never   Smokeless tobacco: Never  Vaping Use   Vaping Use: Never used  Substance and Sexual Activity   Alcohol use: No   Drug use: No   Sexual activity: Not on file  Other Topics Concern   Not on file  Social History Narrative   Lives alone. Has daily contact with friends. Transportation by friends and sometimes [employer].   Social Determinants of Health   Financial Resource Strain: Low Risk  (01/31/2020)   Overall Financial Resource Strain (CARDIA)    Difficulty of Paying Living Expenses: Not hard at all  Food Insecurity: No Food Insecurity  (06/12/2022)   Hunger Vital Sign    Worried About Running Out of Food in the Last Year: Never true    Ran Out of Food in the Last Year: Never true  Transportation Needs: No Transportation Needs (06/12/2022)   PRAPARE - Hydrologist (Medical): No    Lack of Transportation (Non-Medical): No  Physical Activity: Sufficiently Active (01/31/2020)   Exercise Vital Sign    Days of Exercise per Week: 7 days    Minutes of Exercise per Session: 120 min  Stress: No Stress Concern Present (08/30/2019)   [place]    Feeling of Stress :  Not at all  Social Connections: Not on file  Intimate Partner Violence: Not At Risk (06/12/2022)   Humiliation, Afraid, Rape, and Kick questionnaire    Fear of Current or Ex-Partner: No    Emotionally Abused: No    Physically Abused: No    Sexually Abused: No    Family History  Problem Relation Age of Onset   Hypertension Mother    Heart attack Mother    Diabetes Father    Hypertension Father    Stroke Father    Stroke Sister    Diabetes Sister    Heart disease Sister        Before age 41-Aneurysm   Dementia Sister    Alzheimer's disease Sister    Diabetes Brother    Diabetes Brother    Heart disease Brother        Before age 11   Heart attack Brother    Heart attack Sister    Diabetes Sister    Alzheimer's disease Sister     ROS: no fevers or chills, productive cough, hemoptysis, dysphasia, odynophagia, melena, hematochezia, dysuria, hematuria, rash, seizure activity, orthopnea, PND, pedal edema, claudication. Remaining systems are negative.  Physical Exam: Well-developed well-nourished in no acute distress.  Skin is warm and dry.  HEENT is normal.  Neck is supple.  Chest is clear to auscultation with normal expansion.  Cardiovascular exam is regular rate and rhythm.  Abdominal exam nontender or distended. No masses palpated. Extremities show trace to  1+ edema. neuro grossly intact   A/P  1 paroxysmal atrial fibrillation-patient is in sinus rhythm on examination.  Continue amiodarone, metoprolol and apixaban.   2 hypertension-blood pressure elevated; increase amlodipine to 10 mg daily and follow.  3 hyperlipidemia-continue statin.  4 peripheral vascular disease-Per vascular surgery.  5 chronic diastolic congestive heart failure-patient appears to be euvolemic.  Continue diuretic at present dose.  6 coronary calcification-continue statin.  I will have patient follow-up with APP in 6 to 8 weeks.  I will see her back in 4 to 6 months.  Kirk Ruths, MD

## 2022-06-24 DIAGNOSIS — N179 Acute kidney failure, unspecified: Secondary | ICD-10-CM | POA: Diagnosis not present

## 2022-06-24 DIAGNOSIS — I2510 Atherosclerotic heart disease of native coronary artery without angina pectoris: Secondary | ICD-10-CM | POA: Diagnosis not present

## 2022-06-24 DIAGNOSIS — I480 Paroxysmal atrial fibrillation: Secondary | ICD-10-CM | POA: Diagnosis not present

## 2022-06-24 DIAGNOSIS — J9601 Acute respiratory failure with hypoxia: Secondary | ICD-10-CM | POA: Diagnosis not present

## 2022-06-24 DIAGNOSIS — G309 Alzheimer's disease, unspecified: Secondary | ICD-10-CM | POA: Diagnosis not present

## 2022-06-24 DIAGNOSIS — I110 Hypertensive heart disease with heart failure: Secondary | ICD-10-CM | POA: Diagnosis not present

## 2022-06-24 DIAGNOSIS — E1151 Type 2 diabetes mellitus with diabetic peripheral angiopathy without gangrene: Secondary | ICD-10-CM | POA: Diagnosis not present

## 2022-06-24 DIAGNOSIS — I5033 Acute on chronic diastolic (congestive) heart failure: Secondary | ICD-10-CM | POA: Diagnosis not present

## 2022-06-24 DIAGNOSIS — J449 Chronic obstructive pulmonary disease, unspecified: Secondary | ICD-10-CM | POA: Diagnosis not present

## 2022-06-26 ENCOUNTER — Encounter: Payer: Self-pay | Admitting: Cardiology

## 2022-06-26 ENCOUNTER — Ambulatory Visit: Payer: Medicare HMO | Attending: Cardiology | Admitting: Cardiology

## 2022-06-26 VITALS — BP 150/70 | HR 72 | Ht 60.0 in | Wt 88.0 lb

## 2022-06-26 DIAGNOSIS — J449 Chronic obstructive pulmonary disease, unspecified: Secondary | ICD-10-CM | POA: Diagnosis not present

## 2022-06-26 DIAGNOSIS — E785 Hyperlipidemia, unspecified: Secondary | ICD-10-CM | POA: Diagnosis not present

## 2022-06-26 DIAGNOSIS — I5033 Acute on chronic diastolic (congestive) heart failure: Secondary | ICD-10-CM | POA: Diagnosis not present

## 2022-06-26 DIAGNOSIS — I5032 Chronic diastolic (congestive) heart failure: Secondary | ICD-10-CM | POA: Diagnosis not present

## 2022-06-26 DIAGNOSIS — J9601 Acute respiratory failure with hypoxia: Secondary | ICD-10-CM | POA: Diagnosis not present

## 2022-06-26 DIAGNOSIS — I2510 Atherosclerotic heart disease of native coronary artery without angina pectoris: Secondary | ICD-10-CM | POA: Diagnosis not present

## 2022-06-26 DIAGNOSIS — I480 Paroxysmal atrial fibrillation: Secondary | ICD-10-CM | POA: Diagnosis not present

## 2022-06-26 DIAGNOSIS — I10 Essential (primary) hypertension: Secondary | ICD-10-CM | POA: Diagnosis not present

## 2022-06-26 DIAGNOSIS — E1151 Type 2 diabetes mellitus with diabetic peripheral angiopathy without gangrene: Secondary | ICD-10-CM | POA: Diagnosis not present

## 2022-06-26 DIAGNOSIS — G309 Alzheimer's disease, unspecified: Secondary | ICD-10-CM | POA: Diagnosis not present

## 2022-06-26 DIAGNOSIS — I110 Hypertensive heart disease with heart failure: Secondary | ICD-10-CM | POA: Diagnosis not present

## 2022-06-26 DIAGNOSIS — N179 Acute kidney failure, unspecified: Secondary | ICD-10-CM | POA: Diagnosis not present

## 2022-06-26 MED ORDER — AMLODIPINE BESYLATE 10 MG PO TABS: 10.0000 mg | ORAL_TABLET | Freq: Every day | ORAL | 3 refills | Status: DC

## 2022-06-26 NOTE — Patient Instructions (Signed)
Medication Instructions:   INCREASE AMLODIPINE TO 10 MG ONCE DAILY  *If you need a refill on your cardiac medications before your next appointment, please call your pharmacy*   Follow-Up: At [facility], you and your health needs are our priority.  As part of our continuing mission to provide you with exceptional heart care, we have created designated Provider Care Teams.  These Care Teams include your primary Cardiologist (physician) and Advanced Practice Providers (APPs -  Physician Assistants and Nurse Practitioners) who all work together to provide you with the care you need, when you need it.  We recommend signing up for the patient portal called "MyChart".  Sign up information is provided on this After Visit Summary.  MyChart is used to connect with patients for Virtual Visits (Telemedicine).  Patients are able to view lab/test results, encounter notes, upcoming appointments, etc.  Non-urgent messages can be sent to your provider as well.   To learn more about what you can do with MyChart, go to [email or web address].    Your next appointment:   6 week(s)  Provider:   ANY APP    Then, Kirk Ruths, MD will plan to see you again in 4 month(s).

## 2022-06-27 DIAGNOSIS — N179 Acute kidney failure, unspecified: Secondary | ICD-10-CM | POA: Diagnosis not present

## 2022-06-27 DIAGNOSIS — E1151 Type 2 diabetes mellitus with diabetic peripheral angiopathy without gangrene: Secondary | ICD-10-CM | POA: Diagnosis not present

## 2022-06-27 DIAGNOSIS — I5033 Acute on chronic diastolic (congestive) heart failure: Secondary | ICD-10-CM | POA: Diagnosis not present

## 2022-06-27 DIAGNOSIS — J9601 Acute respiratory failure with hypoxia: Secondary | ICD-10-CM | POA: Diagnosis not present

## 2022-06-27 DIAGNOSIS — I2510 Atherosclerotic heart disease of native coronary artery without angina pectoris: Secondary | ICD-10-CM | POA: Diagnosis not present

## 2022-06-27 DIAGNOSIS — I480 Paroxysmal atrial fibrillation: Secondary | ICD-10-CM | POA: Diagnosis not present

## 2022-06-27 DIAGNOSIS — J449 Chronic obstructive pulmonary disease, unspecified: Secondary | ICD-10-CM | POA: Diagnosis not present

## 2022-06-27 DIAGNOSIS — G309 Alzheimer's disease, unspecified: Secondary | ICD-10-CM | POA: Diagnosis not present

## 2022-06-27 DIAGNOSIS — I110 Hypertensive heart disease with heart failure: Secondary | ICD-10-CM | POA: Diagnosis not present

## 2022-07-01 DIAGNOSIS — I480 Paroxysmal atrial fibrillation: Secondary | ICD-10-CM | POA: Diagnosis not present

## 2022-07-01 DIAGNOSIS — J9601 Acute respiratory failure with hypoxia: Secondary | ICD-10-CM | POA: Diagnosis not present

## 2022-07-01 DIAGNOSIS — I5033 Acute on chronic diastolic (congestive) heart failure: Secondary | ICD-10-CM | POA: Diagnosis not present

## 2022-07-01 DIAGNOSIS — E1151 Type 2 diabetes mellitus with diabetic peripheral angiopathy without gangrene: Secondary | ICD-10-CM | POA: Diagnosis not present

## 2022-07-01 DIAGNOSIS — G309 Alzheimer's disease, unspecified: Secondary | ICD-10-CM | POA: Diagnosis not present

## 2022-07-01 DIAGNOSIS — I2510 Atherosclerotic heart disease of native coronary artery without angina pectoris: Secondary | ICD-10-CM | POA: Diagnosis not present

## 2022-07-01 DIAGNOSIS — N179 Acute kidney failure, unspecified: Secondary | ICD-10-CM | POA: Diagnosis not present

## 2022-07-01 DIAGNOSIS — I110 Hypertensive heart disease with heart failure: Secondary | ICD-10-CM | POA: Diagnosis not present

## 2022-07-01 DIAGNOSIS — J449 Chronic obstructive pulmonary disease, unspecified: Secondary | ICD-10-CM | POA: Diagnosis not present

## 2022-07-02 DIAGNOSIS — I110 Hypertensive heart disease with heart failure: Secondary | ICD-10-CM | POA: Diagnosis not present

## 2022-07-02 DIAGNOSIS — N179 Acute kidney failure, unspecified: Secondary | ICD-10-CM | POA: Diagnosis not present

## 2022-07-02 DIAGNOSIS — I5033 Acute on chronic diastolic (congestive) heart failure: Secondary | ICD-10-CM | POA: Diagnosis not present

## 2022-07-02 DIAGNOSIS — I480 Paroxysmal atrial fibrillation: Secondary | ICD-10-CM | POA: Diagnosis not present

## 2022-07-02 DIAGNOSIS — I2510 Atherosclerotic heart disease of native coronary artery without angina pectoris: Secondary | ICD-10-CM | POA: Diagnosis not present

## 2022-07-02 DIAGNOSIS — J9601 Acute respiratory failure with hypoxia: Secondary | ICD-10-CM | POA: Diagnosis not present

## 2022-07-02 DIAGNOSIS — J449 Chronic obstructive pulmonary disease, unspecified: Secondary | ICD-10-CM | POA: Diagnosis not present

## 2022-07-02 DIAGNOSIS — G309 Alzheimer's disease, unspecified: Secondary | ICD-10-CM | POA: Diagnosis not present

## 2022-07-02 DIAGNOSIS — E1151 Type 2 diabetes mellitus with diabetic peripheral angiopathy without gangrene: Secondary | ICD-10-CM | POA: Diagnosis not present

## 2022-07-04 DIAGNOSIS — I5033 Acute on chronic diastolic (congestive) heart failure: Secondary | ICD-10-CM | POA: Diagnosis not present

## 2022-07-04 DIAGNOSIS — N179 Acute kidney failure, unspecified: Secondary | ICD-10-CM | POA: Diagnosis not present

## 2022-07-04 DIAGNOSIS — J449 Chronic obstructive pulmonary disease, unspecified: Secondary | ICD-10-CM | POA: Diagnosis not present

## 2022-07-04 DIAGNOSIS — I480 Paroxysmal atrial fibrillation: Secondary | ICD-10-CM | POA: Diagnosis not present

## 2022-07-04 DIAGNOSIS — G309 Alzheimer's disease, unspecified: Secondary | ICD-10-CM | POA: Diagnosis not present

## 2022-07-04 DIAGNOSIS — E1151 Type 2 diabetes mellitus with diabetic peripheral angiopathy without gangrene: Secondary | ICD-10-CM | POA: Diagnosis not present

## 2022-07-04 DIAGNOSIS — J9601 Acute respiratory failure with hypoxia: Secondary | ICD-10-CM | POA: Diagnosis not present

## 2022-07-04 DIAGNOSIS — I2510 Atherosclerotic heart disease of native coronary artery without angina pectoris: Secondary | ICD-10-CM | POA: Diagnosis not present

## 2022-07-04 DIAGNOSIS — I110 Hypertensive heart disease with heart failure: Secondary | ICD-10-CM | POA: Diagnosis not present

## 2022-07-09 DIAGNOSIS — I2510 Atherosclerotic heart disease of native coronary artery without angina pectoris: Secondary | ICD-10-CM | POA: Diagnosis not present

## 2022-07-09 DIAGNOSIS — J449 Chronic obstructive pulmonary disease, unspecified: Secondary | ICD-10-CM | POA: Diagnosis not present

## 2022-07-09 DIAGNOSIS — I5033 Acute on chronic diastolic (congestive) heart failure: Secondary | ICD-10-CM | POA: Diagnosis not present

## 2022-07-09 DIAGNOSIS — J9601 Acute respiratory failure with hypoxia: Secondary | ICD-10-CM | POA: Diagnosis not present

## 2022-07-09 DIAGNOSIS — G309 Alzheimer's disease, unspecified: Secondary | ICD-10-CM | POA: Diagnosis not present

## 2022-07-09 DIAGNOSIS — I480 Paroxysmal atrial fibrillation: Secondary | ICD-10-CM | POA: Diagnosis not present

## 2022-07-09 DIAGNOSIS — I110 Hypertensive heart disease with heart failure: Secondary | ICD-10-CM | POA: Diagnosis not present

## 2022-07-09 DIAGNOSIS — E1151 Type 2 diabetes mellitus with diabetic peripheral angiopathy without gangrene: Secondary | ICD-10-CM | POA: Diagnosis not present

## 2022-07-09 DIAGNOSIS — N179 Acute kidney failure, unspecified: Secondary | ICD-10-CM | POA: Diagnosis not present

## 2022-07-10 DIAGNOSIS — I110 Hypertensive heart disease with heart failure: Secondary | ICD-10-CM | POA: Diagnosis not present

## 2022-07-10 DIAGNOSIS — J9601 Acute respiratory failure with hypoxia: Secondary | ICD-10-CM | POA: Diagnosis not present

## 2022-07-10 DIAGNOSIS — J449 Chronic obstructive pulmonary disease, unspecified: Secondary | ICD-10-CM | POA: Diagnosis not present

## 2022-07-10 DIAGNOSIS — I2510 Atherosclerotic heart disease of native coronary artery without angina pectoris: Secondary | ICD-10-CM | POA: Diagnosis not present

## 2022-07-10 DIAGNOSIS — N179 Acute kidney failure, unspecified: Secondary | ICD-10-CM | POA: Diagnosis not present

## 2022-07-10 DIAGNOSIS — I5033 Acute on chronic diastolic (congestive) heart failure: Secondary | ICD-10-CM | POA: Diagnosis not present

## 2022-07-10 DIAGNOSIS — I480 Paroxysmal atrial fibrillation: Secondary | ICD-10-CM | POA: Diagnosis not present

## 2022-07-10 DIAGNOSIS — E1151 Type 2 diabetes mellitus with diabetic peripheral angiopathy without gangrene: Secondary | ICD-10-CM | POA: Diagnosis not present

## 2022-07-10 DIAGNOSIS — G309 Alzheimer's disease, unspecified: Secondary | ICD-10-CM | POA: Diagnosis not present

## 2022-07-11 DIAGNOSIS — I480 Paroxysmal atrial fibrillation: Secondary | ICD-10-CM | POA: Diagnosis not present

## 2022-07-11 DIAGNOSIS — J449 Chronic obstructive pulmonary disease, unspecified: Secondary | ICD-10-CM | POA: Diagnosis not present

## 2022-07-11 DIAGNOSIS — I2510 Atherosclerotic heart disease of native coronary artery without angina pectoris: Secondary | ICD-10-CM | POA: Diagnosis not present

## 2022-07-11 DIAGNOSIS — J9601 Acute respiratory failure with hypoxia: Secondary | ICD-10-CM | POA: Diagnosis not present

## 2022-07-11 DIAGNOSIS — G309 Alzheimer's disease, unspecified: Secondary | ICD-10-CM | POA: Diagnosis not present

## 2022-07-11 DIAGNOSIS — I5033 Acute on chronic diastolic (congestive) heart failure: Secondary | ICD-10-CM | POA: Diagnosis not present

## 2022-07-11 DIAGNOSIS — I110 Hypertensive heart disease with heart failure: Secondary | ICD-10-CM | POA: Diagnosis not present

## 2022-07-11 DIAGNOSIS — E1151 Type 2 diabetes mellitus with diabetic peripheral angiopathy without gangrene: Secondary | ICD-10-CM | POA: Diagnosis not present

## 2022-07-11 DIAGNOSIS — N179 Acute kidney failure, unspecified: Secondary | ICD-10-CM | POA: Diagnosis not present

## 2022-07-16 DIAGNOSIS — J9601 Acute respiratory failure with hypoxia: Secondary | ICD-10-CM | POA: Diagnosis not present

## 2022-07-16 DIAGNOSIS — I2510 Atherosclerotic heart disease of native coronary artery without angina pectoris: Secondary | ICD-10-CM | POA: Diagnosis not present

## 2022-07-16 DIAGNOSIS — E1151 Type 2 diabetes mellitus with diabetic peripheral angiopathy without gangrene: Secondary | ICD-10-CM | POA: Diagnosis not present

## 2022-07-16 DIAGNOSIS — J449 Chronic obstructive pulmonary disease, unspecified: Secondary | ICD-10-CM | POA: Diagnosis not present

## 2022-07-16 DIAGNOSIS — I110 Hypertensive heart disease with heart failure: Secondary | ICD-10-CM | POA: Diagnosis not present

## 2022-07-16 DIAGNOSIS — I5033 Acute on chronic diastolic (congestive) heart failure: Secondary | ICD-10-CM | POA: Diagnosis not present

## 2022-07-16 DIAGNOSIS — G309 Alzheimer's disease, unspecified: Secondary | ICD-10-CM | POA: Diagnosis not present

## 2022-07-16 DIAGNOSIS — I480 Paroxysmal atrial fibrillation: Secondary | ICD-10-CM | POA: Diagnosis not present

## 2022-07-16 DIAGNOSIS — N179 Acute kidney failure, unspecified: Secondary | ICD-10-CM | POA: Diagnosis not present

## 2022-07-17 DIAGNOSIS — I5033 Acute on chronic diastolic (congestive) heart failure: Secondary | ICD-10-CM | POA: Diagnosis not present

## 2022-07-17 DIAGNOSIS — I2510 Atherosclerotic heart disease of native coronary artery without angina pectoris: Secondary | ICD-10-CM | POA: Diagnosis not present

## 2022-07-17 DIAGNOSIS — E1151 Type 2 diabetes mellitus with diabetic peripheral angiopathy without gangrene: Secondary | ICD-10-CM | POA: Diagnosis not present

## 2022-07-17 DIAGNOSIS — N179 Acute kidney failure, unspecified: Secondary | ICD-10-CM | POA: Diagnosis not present

## 2022-07-17 DIAGNOSIS — I110 Hypertensive heart disease with heart failure: Secondary | ICD-10-CM | POA: Diagnosis not present

## 2022-07-17 DIAGNOSIS — J449 Chronic obstructive pulmonary disease, unspecified: Secondary | ICD-10-CM | POA: Diagnosis not present

## 2022-07-17 DIAGNOSIS — G309 Alzheimer's disease, unspecified: Secondary | ICD-10-CM | POA: Diagnosis not present

## 2022-07-17 DIAGNOSIS — I480 Paroxysmal atrial fibrillation: Secondary | ICD-10-CM | POA: Diagnosis not present

## 2022-07-17 DIAGNOSIS — J9601 Acute respiratory failure with hypoxia: Secondary | ICD-10-CM | POA: Diagnosis not present

## 2022-07-18 ENCOUNTER — Other Ambulatory Visit: Payer: Self-pay | Admitting: Cardiology

## 2022-07-18 DIAGNOSIS — E1151 Type 2 diabetes mellitus with diabetic peripheral angiopathy without gangrene: Secondary | ICD-10-CM | POA: Diagnosis not present

## 2022-07-18 DIAGNOSIS — N179 Acute kidney failure, unspecified: Secondary | ICD-10-CM | POA: Diagnosis not present

## 2022-07-18 DIAGNOSIS — I5033 Acute on chronic diastolic (congestive) heart failure: Secondary | ICD-10-CM | POA: Diagnosis not present

## 2022-07-18 DIAGNOSIS — J9601 Acute respiratory failure with hypoxia: Secondary | ICD-10-CM | POA: Diagnosis not present

## 2022-07-18 DIAGNOSIS — I110 Hypertensive heart disease with heart failure: Secondary | ICD-10-CM | POA: Diagnosis not present

## 2022-07-18 DIAGNOSIS — J449 Chronic obstructive pulmonary disease, unspecified: Secondary | ICD-10-CM | POA: Diagnosis not present

## 2022-07-18 DIAGNOSIS — I480 Paroxysmal atrial fibrillation: Secondary | ICD-10-CM | POA: Diagnosis not present

## 2022-07-18 DIAGNOSIS — G309 Alzheimer's disease, unspecified: Secondary | ICD-10-CM | POA: Diagnosis not present

## 2022-07-18 DIAGNOSIS — I2510 Atherosclerotic heart disease of native coronary artery without angina pectoris: Secondary | ICD-10-CM | POA: Diagnosis not present

## 2022-07-19 ENCOUNTER — Ambulatory Visit: Payer: Medicare HMO | Admitting: Physician Assistant

## 2022-07-19 ENCOUNTER — Ambulatory Visit (INDEPENDENT_AMBULATORY_CARE_PROVIDER_SITE_OTHER)
Admission: RE | Admit: 2022-07-19 | Discharge: 2022-07-19 | Disposition: A | Discharge status: Home / Self Care | Payer: Medicare HMO | Source: Ambulatory Visit | Attending: Physician Assistant | Admitting: Physician Assistant

## 2022-07-19 ENCOUNTER — Ambulatory Visit (HOSPITAL_COMMUNITY)
Admission: RE | Admit: 2022-07-19 | Discharge: 2022-07-19 | Disposition: A | Discharge status: Home / Self Care | Payer: Medicare HMO | Source: Ambulatory Visit | Attending: Physician Assistant | Admitting: Physician Assistant

## 2022-07-19 ENCOUNTER — Encounter: Payer: Self-pay | Admitting: Physician Assistant

## 2022-07-19 VITALS — BP 153/76 | HR 65 | Temp 97.6°F | Resp 14 | Ht 60.0 in | Wt 89.4 lb

## 2022-07-19 DIAGNOSIS — M2012 Hallux valgus (acquired), left foot: Secondary | ICD-10-CM | POA: Diagnosis not present

## 2022-07-19 DIAGNOSIS — L03032 Cellulitis of left toe: Secondary | ICD-10-CM | POA: Diagnosis not present

## 2022-07-19 DIAGNOSIS — I779 Disorder of arteries and arterioles, unspecified: Secondary | ICD-10-CM | POA: Diagnosis not present

## 2022-07-19 DIAGNOSIS — I70222 Atherosclerosis of native arteries of extremities with rest pain, left leg: Secondary | ICD-10-CM | POA: Diagnosis not present

## 2022-07-19 DIAGNOSIS — L600 Ingrowing nail: Secondary | ICD-10-CM | POA: Diagnosis not present

## 2022-07-19 LAB — VAS US ABI WITH/WO TBI

## 2022-07-19 NOTE — Progress Notes (Signed)
peripheral arterial diseaseVASCULAR & VEIN [facility]ISTS [facility] [place] HISTORY AND PHYSICAL   History of Present Illness:  Patient is a 89 y.o. year old female who presents for evaluation of   PAD.    History of left above knee to below knee popliteal artery bypass graft on 07/23/10.  She developed rest pain in April 2023 and under went Angiogram.   Most recent angiogram demonstrated near-occlusion of the left common femoral artery, with profunda only runoff, and reconstitution of the posterior tibial artery distally.  This vessel was very small.             She then underwent Left common femoral endarterectomy.  She is here today for exam and f/u studies with new baseline  left LE inflow.    She was last seen 6 months ago and had good wave forms both triphasic an biphasic.  She denied rest pain, short distance claudication and non healing  wounds.  She is now home and ambulates with a rolling walker.     She is medically managed on Eliquis and Lipitor.     Past Medical History:  Diagnosis Date   Arthritis    Atrial fibrillation    Diabetes mellitus    DVT (deep venous thrombosis)    Hyperlipidemia    Hypertension    Leg pain    Peripheral arterial disease    Stroke 1999    Past Surgical History:  Procedure Laterality Date   ABDOMINAL AORTOGRAM W/LOWER EXTREMITY N/A 07/11/2021   Procedure: ABDOMINAL AORTOGRAM W/LOWER EXTREMITY;  Surgeon: Victorino Sparrowobins, Joshua E, MD;  [facility];  Service: Cardiovascular;  Laterality: N/A;   BACK SURGERY  1996   CARDIOVERSION N/A 02/15/2020   Procedure: CARDIOVERSION;  Surgeon: Elease HashimotoNahser, Deloris PingPhilip J, MD;  [facility];  Service: Cardiovascular;  Laterality: N/A;   CARDIOVERSION N/A 08/07/2021   Procedure: CARDIOVERSION;  Surgeon: Maisie FusBranch, Mary E, MD;  [facility];  Service: Cardiovascular;  Laterality: N/A;   CATARACT EXTRACTION  2000   CESAREAN SECTION     ENDARTERECTOMY FEMORAL Left 07/26/2021   Procedure: LEFT COMMON FEMORAL  ENDARTERECTOMY;  Surgeon: Victorino Sparrowobins, Joshua E, MD;  [facility];  Service: Vascular;  Laterality: Left;   EYE SURGERY     FEMORAL BYPASS Left July 23, 2010   LUMBAR DISC SURGERY     PATCH ANGIOPLASTY Left 07/26/2021   Procedure: PATCH ANGIOPLASTY WITH XENOSURE BIOLOGIC 1 cm x 6 cm PATCH;  Surgeon: Victorino Sparrowobins, Joshua E, MD;  [facility];  Service: Vascular;  Laterality: Left;   SPINE SURGERY     TEE WITHOUT CARDIOVERSION N/A 08/07/2021   Procedure: TRANSESOPHAGEAL ECHOCARDIOGRAM (TEE);  Surgeon: Maisie FusBranch, Mary E, MD;  [facility];  Service: Cardiovascular;  Laterality: N/A;    ROS:   General:  No weight loss, Fever, chills  HEENT: No recent headaches, no nasal bleeding, no visual changes, no sore throat  Neurologic: No dizziness, blackouts, seizures. No recent symptoms of stroke or mini- stroke. No recent episodes of slurred speech, or temporary blindness.  Cardiac: No recent episodes of chest pain/pressure, no shortness of breath at rest.  No shortness of breath with exertion.  Positive history of atrial fibrillation or irregular heartbeat  Vascular: positive history of rest pain in feet.  No history of claudication.  No history of non-healing ulcer, No history of DVT   Pulmonary: No home oxygen, no productive cough, no hemoptysis,  No asthma or wheezing  Musculoskeletal:  [ x] Arthritis, [ ]   Low back pain,  [ ]  Joint pain  Hematologic:No history of hypercoagulable state.  No history of easy bleeding.  No history of anemia  Gastrointestinal: No hematochezia or melena,  No gastroesophageal reflux, no trouble swallowing  Urinary: [ ]  chronic Kidney disease, [ ]  on HD - [ ]  MWF or [ ]  TTHS, [ ]  Burning with urination, [ ]  Frequent urination, [ ]  Difficulty urinating;   Skin: No rashes  Psychological: No history of anxiety,  No history of depression  Social History Social History   Tobacco Use   Smoking status: Never   Smokeless tobacco: Never  Vaping Use   Vaping Use:  Never used  Substance Use Topics   Alcohol use: No   Drug use: No    Family History Family History  Problem Relation Age of Onset   Hypertension Mother    Heart attack Mother    Diabetes Father    Hypertension Father    Stroke Father    Stroke Sister    Diabetes Sister    Heart disease Sister        Before age 67-Aneurysm   Dementia Sister    Alzheimer's disease Sister    Diabetes Brother    Diabetes Brother    Heart disease Brother        Before age 93   Heart attack Brother    Heart attack Sister    Diabetes Sister    Alzheimer's disease Sister     Allergies  No Known Allergies   Current Outpatient Medications  Medication Sig Dispense Refill   ACCU-CHEK SOFTCLIX LANCETS lancets      amLODipine (NORVASC) 10 MG tablet Take 1 tablet (10 mg total) by mouth daily. 90 tablet 3   diclofenac Sodium (VOLTAREN) 1 % GEL Apply 2 g topically 2 (two) times daily as needed (pain).     docusate (COLACE) 50 MG/5ML liquid Take 20 mLs (200 mg total) by mouth 2 (two) times daily. 100 mL 0   ELIQUIS 2.5 MG TABS tablet TAKE 1 TABLET BY MOUTH TWICE A DAY (Patient taking differently: Take 2.5 mg by mouth 2 (two) times daily.) 180 tablet 1   ENTRESTO 24-26 MG Take 1 tablet by mouth 2 (two) times daily.     ezetimibe (ZETIA) 10 MG tablet Take 1 tablet (10 mg total) by mouth daily. 90 tablet 3   furosemide (LASIX) 20 MG tablet Take 1 tablet (20 mg total) by mouth daily. 30 tablet 0   glipiZIDE (GLUCOTROL) 10 MG tablet Take 10 mg by mouth 2 (two) times daily.     LANTUS SOLOSTAR 100 UNIT/ML Solostar Pen Inject 10 Units into the skin at bedtime.     latanoprost (XALATAN) 0.005 % ophthalmic solution Place 1 drop into both eyes at bedtime.     metFORMIN (GLUCOPHAGE) 500 MG tablet Take 1 tablet (500 mg total) by mouth 2 (two) times daily. 60 tablet 1   metoprolol tartrate (LOPRESSOR) 50 MG tablet TAKE 1 TABLET BY MOUTH TWICE A DAY 60 tablet 6   Multiple Vitamin (MULTIVITAMIN) tablet Take 1 tablet  by mouth daily. Centrum silver     Omega-3 Fatty Acids (FISH OIL) 1200 MG CAPS Take 1,200 mg by mouth daily.     ondansetron (ZOFRAN) 4 MG tablet Take 4 mg by mouth every 8 (eight) hours as needed for nausea or vomiting.     pantoprazole (PROTONIX) 40 MG tablet Take 40 mg by mouth daily.     amiodarone (PACERONE) 200 MG  tablet Take 1 tablet (200 mg total) by mouth daily. 30 tablet 0   atorvastatin (LIPITOR) 40 MG tablet Take 1 tablet (40 mg total) by mouth daily. 90 tablet 3   No current facility-administered medications for this visit.    Physical Examination  Vitals:   07/19/22 0951  BP: (!) 153/76  Pulse: 65  Resp: 14  Temp: 97.6 F (36.4 C)  TempSrc: Temporal  SpO2: 97%  Weight: 89 lb 6.4 oz (40.6 kg)  Height: 5' (1.524 m)    Body mass index is 17.46 kg/m.  General:  Alert and oriented, no acute distress HEENT: Normal Neck: No bruit or JVD Pulmonary: Clear to auscultation bilaterally Cardiac: Regular Rate and Rhythm without murmur Abdomen: Soft, non-tender, non-distended, no mass, no scars Skin: No rash Extremity Pulses:   radial,  femoral, pulses bilaterally Musculoskeletal: No deformity or edema  Neurologic: Upper and lower extremity motor grossly intact and symmetric  DATA:  ABI Findings:  +---------+------------------+-----+--------+--------+  Right   Rt Pressure (mmHg)IndexWaveformComment   +---------+------------------+-----+--------+--------+  Brachial 143                                      +---------+------------------+-----+--------+--------+  PTA     254               1.78 biphasic          +---------+------------------+-----+--------+--------+  DP      254               1.78 biphasic          +---------+------------------+-----+--------+--------+  Great Toe65                0.45 Normal            +---------+------------------+-----+--------+--------+   +---------+------------------+-----+--------+-------+  Left     Lt Pressure (mmHg)IndexWaveformComment  +---------+------------------+-----+--------+-------+  Brachial 128                                     +---------+------------------+-----+--------+-------+  PTA     254               1.78 biphasic         +---------+------------------+-----+--------+-------+  DP      254               1.78 biphasic         +---------+------------------+-----+--------+-------+  Great Toe68                0.48 Abnormal         +---------+------------------+-----+--------+-------+   +-------+-----------+-----------+------------+------------+  ABI/TBIToday's ABIToday's TBIPrevious ABIPrevious TBI  +-------+-----------+-----------+------------+------------+  Right [place]         0.45       [place]          0.55          +-------+-----------+-----------+------------+------------+  Left  [place]         0.48       [place]          0.20          +-------+-----------+-----------+------------+------------+      Bilateral TBIs appear essentially unchanged.    Summary:  Right: Resting right ankle-brachial index indicates noncompressible right  lower extremity arteries. The right toe-brachial index is abnormal.   Left: Resting left ankle-brachial index indicates noncompressible left  lower  extremity arteries. The left toe-brachial index is abnormal.   -----------+--------+-----+---------------+----------+--------+  LEFT      PSV cm/sRatioStenosis       Waveform  Comments  +-----------+--------+-----+---------------+----------+--------+  EIA Distal 168                         triphasic           +-----------+--------+-----+---------------+----------+--------+  CFA Prox   119                         triphasic           +-----------+--------+-----+---------------+----------+--------+  DFA       94                          biphasic            +-----------+--------+-----+---------------+----------+--------+  SFA  Prox   59                          biphasic            +-----------+--------+-----+---------------+----------+--------+  SFA Mid    224          50-74% stenosisbiphasic  broad     +-----------+--------+-----+---------------+----------+--------+  SFA Distal 94                          biphasic            +-----------+--------+-----+---------------+----------+--------+  POP Prox   181                         biphasic            +-----------+--------+-----+---------------+----------+--------+  POP Mid    47                          monophasic          +-----------+--------+-----+---------------+----------+--------+  ATA Distal 15                          monophasic          +-----------+--------+-----+---------------+----------+--------+  PTA Distal 59                          monophasic          +-----------+--------+-----+---------------+----------+--------+  PERO Distal10                          monophasic          +-----------+--------+-----+---------------+----------+--------+     Summary:  Patent left lower extremity with 50 - 74% stenosis.   Unable to visualize left AK to BK BPG.   NOTE: This was a suboptimal exam, limited visualization   ASSESSMENT/PLAN:  Patient is a 89 y.o. year old female who presents for evaluation of   PAD.    History of left above knee to below knee popliteal artery bypass graft on 07/23/10.  She developed rest pain in April 2023 and under went Angiogram.  Most recent angiogram demonstrated near-occlusion of the left common femoral artery, with profunda only runoff, and reconstitution of the posterior tibial artery distally.  She then was taken back to the OR by Dr. Karin Lieu on 07/26/21 for  Left common femoral endarterectomy.  The small size of the posterior tibial artery is not amenable to bypass surgery.   Improved inflow with calcified vessels stable and improved TBI on the left LE.  She has no rest pain,  non healing wounds, and is home ambulating with a rolling walker.  We reviewed ischemic symptoms and if these occur she or her family members will call our office.  She will f/u in 1 year for repeat ABI.       Mosetta Pigeon PA-C [facility] [place] Office: [phone]  MD in the office [place]

## 2022-07-22 DIAGNOSIS — E1151 Type 2 diabetes mellitus with diabetic peripheral angiopathy without gangrene: Secondary | ICD-10-CM | POA: Diagnosis not present

## 2022-07-22 DIAGNOSIS — N179 Acute kidney failure, unspecified: Secondary | ICD-10-CM | POA: Diagnosis not present

## 2022-07-22 DIAGNOSIS — E8770 Fluid overload, unspecified: Secondary | ICD-10-CM | POA: Diagnosis not present

## 2022-07-22 DIAGNOSIS — I10 Essential (primary) hypertension: Secondary | ICD-10-CM | POA: Diagnosis not present

## 2022-07-22 DIAGNOSIS — I5033 Acute on chronic diastolic (congestive) heart failure: Secondary | ICD-10-CM | POA: Diagnosis not present

## 2022-07-22 DIAGNOSIS — I110 Hypertensive heart disease with heart failure: Secondary | ICD-10-CM | POA: Diagnosis not present

## 2022-07-22 DIAGNOSIS — I2510 Atherosclerotic heart disease of native coronary artery without angina pectoris: Secondary | ICD-10-CM | POA: Diagnosis not present

## 2022-07-22 DIAGNOSIS — J9601 Acute respiratory failure with hypoxia: Secondary | ICD-10-CM | POA: Diagnosis not present

## 2022-07-22 DIAGNOSIS — G309 Alzheimer's disease, unspecified: Secondary | ICD-10-CM | POA: Diagnosis not present

## 2022-07-22 DIAGNOSIS — E1169 Type 2 diabetes mellitus with other specified complication: Secondary | ICD-10-CM | POA: Diagnosis not present

## 2022-07-22 DIAGNOSIS — I480 Paroxysmal atrial fibrillation: Secondary | ICD-10-CM | POA: Diagnosis not present

## 2022-07-22 DIAGNOSIS — J449 Chronic obstructive pulmonary disease, unspecified: Secondary | ICD-10-CM | POA: Diagnosis not present

## 2022-07-22 DIAGNOSIS — E782 Mixed hyperlipidemia: Secondary | ICD-10-CM | POA: Diagnosis not present

## 2022-07-22 DIAGNOSIS — I509 Heart failure, unspecified: Secondary | ICD-10-CM | POA: Diagnosis not present

## 2022-07-23 DIAGNOSIS — N179 Acute kidney failure, unspecified: Secondary | ICD-10-CM | POA: Diagnosis not present

## 2022-07-23 DIAGNOSIS — J449 Chronic obstructive pulmonary disease, unspecified: Secondary | ICD-10-CM | POA: Diagnosis not present

## 2022-07-23 DIAGNOSIS — I480 Paroxysmal atrial fibrillation: Secondary | ICD-10-CM | POA: Diagnosis not present

## 2022-07-23 DIAGNOSIS — E1151 Type 2 diabetes mellitus with diabetic peripheral angiopathy without gangrene: Secondary | ICD-10-CM | POA: Diagnosis not present

## 2022-07-23 DIAGNOSIS — I2510 Atherosclerotic heart disease of native coronary artery without angina pectoris: Secondary | ICD-10-CM | POA: Diagnosis not present

## 2022-07-23 DIAGNOSIS — I110 Hypertensive heart disease with heart failure: Secondary | ICD-10-CM | POA: Diagnosis not present

## 2022-07-23 DIAGNOSIS — I5033 Acute on chronic diastolic (congestive) heart failure: Secondary | ICD-10-CM | POA: Diagnosis not present

## 2022-07-23 DIAGNOSIS — J9601 Acute respiratory failure with hypoxia: Secondary | ICD-10-CM | POA: Diagnosis not present

## 2022-07-23 DIAGNOSIS — G309 Alzheimer's disease, unspecified: Secondary | ICD-10-CM | POA: Diagnosis not present

## 2022-07-29 DIAGNOSIS — G309 Alzheimer's disease, unspecified: Secondary | ICD-10-CM | POA: Diagnosis not present

## 2022-07-29 DIAGNOSIS — J9601 Acute respiratory failure with hypoxia: Secondary | ICD-10-CM | POA: Diagnosis not present

## 2022-07-29 DIAGNOSIS — I2510 Atherosclerotic heart disease of native coronary artery without angina pectoris: Secondary | ICD-10-CM | POA: Diagnosis not present

## 2022-07-29 DIAGNOSIS — I480 Paroxysmal atrial fibrillation: Secondary | ICD-10-CM | POA: Diagnosis not present

## 2022-07-29 DIAGNOSIS — I110 Hypertensive heart disease with heart failure: Secondary | ICD-10-CM | POA: Diagnosis not present

## 2022-07-29 DIAGNOSIS — N179 Acute kidney failure, unspecified: Secondary | ICD-10-CM | POA: Diagnosis not present

## 2022-07-29 DIAGNOSIS — J449 Chronic obstructive pulmonary disease, unspecified: Secondary | ICD-10-CM | POA: Diagnosis not present

## 2022-07-29 DIAGNOSIS — E1151 Type 2 diabetes mellitus with diabetic peripheral angiopathy without gangrene: Secondary | ICD-10-CM | POA: Diagnosis not present

## 2022-07-29 DIAGNOSIS — I5033 Acute on chronic diastolic (congestive) heart failure: Secondary | ICD-10-CM | POA: Diagnosis not present

## 2022-07-30 DIAGNOSIS — J9601 Acute respiratory failure with hypoxia: Secondary | ICD-10-CM | POA: Diagnosis not present

## 2022-07-30 DIAGNOSIS — J449 Chronic obstructive pulmonary disease, unspecified: Secondary | ICD-10-CM | POA: Diagnosis not present

## 2022-07-30 DIAGNOSIS — I5033 Acute on chronic diastolic (congestive) heart failure: Secondary | ICD-10-CM | POA: Diagnosis not present

## 2022-07-30 DIAGNOSIS — N179 Acute kidney failure, unspecified: Secondary | ICD-10-CM | POA: Diagnosis not present

## 2022-07-30 DIAGNOSIS — E1151 Type 2 diabetes mellitus with diabetic peripheral angiopathy without gangrene: Secondary | ICD-10-CM | POA: Diagnosis not present

## 2022-07-30 DIAGNOSIS — I2510 Atherosclerotic heart disease of native coronary artery without angina pectoris: Secondary | ICD-10-CM | POA: Diagnosis not present

## 2022-07-30 DIAGNOSIS — G309 Alzheimer's disease, unspecified: Secondary | ICD-10-CM | POA: Diagnosis not present

## 2022-07-30 DIAGNOSIS — I480 Paroxysmal atrial fibrillation: Secondary | ICD-10-CM | POA: Diagnosis not present

## 2022-07-30 DIAGNOSIS — I110 Hypertensive heart disease with heart failure: Secondary | ICD-10-CM | POA: Diagnosis not present

## 2022-07-31 DIAGNOSIS — L600 Ingrowing nail: Secondary | ICD-10-CM | POA: Diagnosis not present

## 2022-08-01 DIAGNOSIS — I110 Hypertensive heart disease with heart failure: Secondary | ICD-10-CM | POA: Diagnosis not present

## 2022-08-01 DIAGNOSIS — E1151 Type 2 diabetes mellitus with diabetic peripheral angiopathy without gangrene: Secondary | ICD-10-CM | POA: Diagnosis not present

## 2022-08-01 DIAGNOSIS — I5033 Acute on chronic diastolic (congestive) heart failure: Secondary | ICD-10-CM | POA: Diagnosis not present

## 2022-08-01 DIAGNOSIS — N179 Acute kidney failure, unspecified: Secondary | ICD-10-CM | POA: Diagnosis not present

## 2022-08-01 DIAGNOSIS — J449 Chronic obstructive pulmonary disease, unspecified: Secondary | ICD-10-CM | POA: Diagnosis not present

## 2022-08-01 DIAGNOSIS — G309 Alzheimer's disease, unspecified: Secondary | ICD-10-CM | POA: Diagnosis not present

## 2022-08-01 DIAGNOSIS — I480 Paroxysmal atrial fibrillation: Secondary | ICD-10-CM | POA: Diagnosis not present

## 2022-08-01 DIAGNOSIS — J9601 Acute respiratory failure with hypoxia: Secondary | ICD-10-CM | POA: Diagnosis not present

## 2022-08-01 DIAGNOSIS — I2510 Atherosclerotic heart disease of native coronary artery without angina pectoris: Secondary | ICD-10-CM | POA: Diagnosis not present

## 2022-08-03 ENCOUNTER — Inpatient Hospital Stay (HOSPITAL_COMMUNITY)
Admission: EM | Admit: 2022-08-03 | Discharge: 2022-08-07 | DRG: 291 | Disposition: A | Discharge status: Home Health | Payer: Medicare HMO | Attending: Internal Medicine | Admitting: Internal Medicine

## 2022-08-03 ENCOUNTER — Other Ambulatory Visit: Payer: Self-pay

## 2022-08-03 ENCOUNTER — Encounter (HOSPITAL_COMMUNITY): Payer: Self-pay

## 2022-08-03 ENCOUNTER — Emergency Department (HOSPITAL_COMMUNITY): Payer: Medicare HMO

## 2022-08-03 DIAGNOSIS — R0602 Shortness of breath: Secondary | ICD-10-CM | POA: Diagnosis not present

## 2022-08-03 DIAGNOSIS — E1122 Type 2 diabetes mellitus with diabetic chronic kidney disease: Secondary | ICD-10-CM | POA: Diagnosis not present

## 2022-08-03 DIAGNOSIS — R269 Unspecified abnormalities of gait and mobility: Secondary | ICD-10-CM | POA: Diagnosis present

## 2022-08-03 DIAGNOSIS — J90 Pleural effusion, not elsewhere classified: Secondary | ICD-10-CM

## 2022-08-03 DIAGNOSIS — E1165 Type 2 diabetes mellitus with hyperglycemia: Secondary | ICD-10-CM | POA: Diagnosis present

## 2022-08-03 DIAGNOSIS — Z7984 Long term (current) use of oral hypoglycemic drugs: Secondary | ICD-10-CM

## 2022-08-03 DIAGNOSIS — J9811 Atelectasis: Secondary | ICD-10-CM | POA: Diagnosis present

## 2022-08-03 DIAGNOSIS — R531 Weakness: Secondary | ICD-10-CM | POA: Diagnosis present

## 2022-08-03 DIAGNOSIS — I081 Rheumatic disorders of both mitral and tricuspid valves: Secondary | ICD-10-CM | POA: Diagnosis present

## 2022-08-03 DIAGNOSIS — J9601 Acute respiratory failure with hypoxia: Secondary | ICD-10-CM | POA: Diagnosis not present

## 2022-08-03 DIAGNOSIS — N1831 Chronic kidney disease, stage 3a: Secondary | ICD-10-CM | POA: Diagnosis present

## 2022-08-03 DIAGNOSIS — D6869 Other thrombophilia: Secondary | ICD-10-CM | POA: Diagnosis present

## 2022-08-03 DIAGNOSIS — I739 Peripheral vascular disease, unspecified: Secondary | ICD-10-CM | POA: Diagnosis not present

## 2022-08-03 DIAGNOSIS — I5033 Acute on chronic diastolic (congestive) heart failure: Secondary | ICD-10-CM | POA: Diagnosis not present

## 2022-08-03 DIAGNOSIS — R0902 Hypoxemia: Secondary | ICD-10-CM | POA: Diagnosis not present

## 2022-08-03 DIAGNOSIS — E785 Hyperlipidemia, unspecified: Secondary | ICD-10-CM | POA: Diagnosis not present

## 2022-08-03 DIAGNOSIS — I130 Hypertensive heart and chronic kidney disease with heart failure and stage 1 through stage 4 chronic kidney disease, or unspecified chronic kidney disease: Principal | ICD-10-CM | POA: Diagnosis present

## 2022-08-03 DIAGNOSIS — D539 Nutritional anemia, unspecified: Secondary | ICD-10-CM | POA: Diagnosis present

## 2022-08-03 DIAGNOSIS — I2510 Atherosclerotic heart disease of native coronary artery without angina pectoris: Secondary | ICD-10-CM | POA: Diagnosis present

## 2022-08-03 DIAGNOSIS — N183 Chronic kidney disease, stage 3 unspecified: Secondary | ICD-10-CM

## 2022-08-03 DIAGNOSIS — I480 Paroxysmal atrial fibrillation: Secondary | ICD-10-CM | POA: Diagnosis not present

## 2022-08-03 DIAGNOSIS — I10 Essential (primary) hypertension: Secondary | ICD-10-CM

## 2022-08-03 DIAGNOSIS — E1169 Type 2 diabetes mellitus with other specified complication: Secondary | ICD-10-CM

## 2022-08-03 DIAGNOSIS — K219 Gastro-esophageal reflux disease without esophagitis: Secondary | ICD-10-CM | POA: Diagnosis present

## 2022-08-03 DIAGNOSIS — F0150 Vascular dementia without behavioral disturbance: Secondary | ICD-10-CM | POA: Diagnosis not present

## 2022-08-03 DIAGNOSIS — R0600 Dyspnea, unspecified: Secondary | ICD-10-CM | POA: Diagnosis not present

## 2022-08-03 DIAGNOSIS — Z833 Family history of diabetes mellitus: Secondary | ICD-10-CM

## 2022-08-03 DIAGNOSIS — E876 Hypokalemia: Secondary | ICD-10-CM | POA: Diagnosis present

## 2022-08-03 DIAGNOSIS — Z823 Family history of stroke: Secondary | ICD-10-CM

## 2022-08-03 DIAGNOSIS — I509 Heart failure, unspecified: Secondary | ICD-10-CM

## 2022-08-03 DIAGNOSIS — Z9889 Other specified postprocedural states: Secondary | ICD-10-CM | POA: Diagnosis not present

## 2022-08-03 DIAGNOSIS — F05 Delirium due to known physiological condition: Secondary | ICD-10-CM | POA: Diagnosis not present

## 2022-08-03 DIAGNOSIS — E1151 Type 2 diabetes mellitus with diabetic peripheral angiopathy without gangrene: Secondary | ICD-10-CM | POA: Diagnosis present

## 2022-08-03 DIAGNOSIS — Z820 Family history of epilepsy and other diseases of the nervous system: Secondary | ICD-10-CM

## 2022-08-03 DIAGNOSIS — N189 Chronic kidney disease, unspecified: Secondary | ICD-10-CM | POA: Diagnosis not present

## 2022-08-03 DIAGNOSIS — Z79899 Other long term (current) drug therapy: Secondary | ICD-10-CM

## 2022-08-03 DIAGNOSIS — Z8249 Family history of ischemic heart disease and other diseases of the circulatory system: Secondary | ICD-10-CM

## 2022-08-03 DIAGNOSIS — J9621 Acute and chronic respiratory failure with hypoxia: Secondary | ICD-10-CM

## 2022-08-03 DIAGNOSIS — I4891 Unspecified atrial fibrillation: Secondary | ICD-10-CM | POA: Diagnosis present

## 2022-08-03 DIAGNOSIS — I110 Hypertensive heart disease with heart failure: Secondary | ICD-10-CM | POA: Diagnosis not present

## 2022-08-03 DIAGNOSIS — Z794 Long term (current) use of insulin: Secondary | ICD-10-CM | POA: Diagnosis not present

## 2022-08-03 DIAGNOSIS — Z8673 Personal history of transient ischemic attack (TIA), and cerebral infarction without residual deficits: Secondary | ICD-10-CM

## 2022-08-03 DIAGNOSIS — Z7901 Long term (current) use of anticoagulants: Secondary | ICD-10-CM

## 2022-08-03 DIAGNOSIS — I4892 Unspecified atrial flutter: Secondary | ICD-10-CM | POA: Diagnosis not present

## 2022-08-03 DIAGNOSIS — I5020 Unspecified systolic (congestive) heart failure: Secondary | ICD-10-CM | POA: Diagnosis not present

## 2022-08-03 LAB — CBG MONITORING, ED: Glucose-Capillary: 285 mg/dL — ABNORMAL HIGH (ref 70–99)

## 2022-08-03 LAB — CBC WITH DIFFERENTIAL/PLATELET
Abs Immature Granulocytes: 0.02 10*3/uL (ref 0.00–0.07)
Basophils Absolute: 0 10*3/uL (ref 0.0–0.1)
Basophils Relative: 0 %
Eosinophils Absolute: 0 10*3/uL (ref 0.0–0.5)
Eosinophils Relative: 0 %
HCT: 25.7 % — ABNORMAL LOW (ref 36.0–46.0)
Hemoglobin: 8.3 g/dL — ABNORMAL LOW (ref 12.0–15.0)
Immature Granulocytes: 0 %
Lymphocytes Relative: 8 %
Lymphs Abs: 0.6 10*3/uL — ABNORMAL LOW (ref 0.7–4.0)
MCH: 34.4 pg — ABNORMAL HIGH (ref 26.0–34.0)
MCHC: 32.3 g/dL (ref 30.0–36.0)
MCV: 106.6 fL — ABNORMAL HIGH (ref 80.0–100.0)
Monocytes Absolute: 0.3 10*3/uL (ref 0.1–1.0)
Monocytes Relative: 4 %
Neutro Abs: 6 10*3/uL (ref 1.7–7.7)
Neutrophils Relative %: 88 %
Platelets: 292 10*3/uL (ref 150–400)
RBC: 2.41 MIL/uL — ABNORMAL LOW (ref 3.87–5.11)
RDW: 15.5 % (ref 11.5–15.5)
WBC: 6.9 10*3/uL (ref 4.0–10.5)
nRBC: 0 % (ref 0.0–0.2)

## 2022-08-03 LAB — BLOOD GAS, VENOUS
Acid-base deficit: 1.1 mmol/L (ref 0.0–2.0)
Bicarbonate: 22.4 mmol/L (ref 20.0–28.0)
O2 Saturation: 36.8 %
Patient temperature: 37
pCO2, Ven: 33 mmHg — ABNORMAL LOW (ref 44–60)
pH, Ven: 7.44 — ABNORMAL HIGH (ref 7.25–7.43)
pO2, Ven: <31 mmHg — CL (ref 32–45)

## 2022-08-03 LAB — GLUCOSE, CAPILLARY: Glucose-Capillary: 312 mg/dL — ABNORMAL HIGH (ref 70–99)

## 2022-08-03 LAB — BRAIN NATRIURETIC PEPTIDE: B Natriuretic Peptide: 1269.5 pg/mL — ABNORMAL HIGH (ref 0.0–100.0)

## 2022-08-03 LAB — TROPONIN I (HIGH SENSITIVITY): Troponin I (High Sensitivity): 19 ng/L — ABNORMAL HIGH (ref <18)

## 2022-08-03 MED ORDER — ACETAMINOPHEN 325 MG PO TABS: 650.0000 mg | ORAL_TABLET | Freq: Four times a day (QID) | ORAL | Status: DC | PRN

## 2022-08-03 MED ORDER — FUROSEMIDE 10 MG/ML IJ SOLN
20.0000 mg | Freq: Two times a day (BID) | INTRAMUSCULAR | Status: DC
  Administered 2022-08-04: 20 mg via INTRAVENOUS
  Filled 2022-08-03: qty 2

## 2022-08-03 MED ORDER — APIXABAN 2.5 MG PO TABS
2.5000 mg | ORAL_TABLET | Freq: Two times a day (BID) | ORAL | Status: DC
  Administered 2022-08-03 – 2022-08-07 (×8): 2.5 mg via ORAL
  Filled 2022-08-03 (×8): qty 1

## 2022-08-03 MED ORDER — AMLODIPINE BESYLATE 10 MG PO TABS
10.0000 mg | ORAL_TABLET | Freq: Every day | ORAL | Status: DC
  Administered 2022-08-04 – 2022-08-07 (×3): 10 mg via ORAL
  Filled 2022-08-03 (×4): qty 1

## 2022-08-03 MED ORDER — INSULIN ASPART 100 UNIT/ML IJ SOLN
0.0000 [IU] | Freq: Every day | INTRAMUSCULAR | Status: DC
  Administered 2022-08-03 – 2022-08-04 (×2): 4 [IU] via SUBCUTANEOUS
  Filled 2022-08-03: qty 0.05

## 2022-08-03 MED ORDER — HYDRALAZINE HCL 20 MG/ML IJ SOLN: 5.0000 mg | Freq: Once | INTRAMUSCULAR | Status: DC

## 2022-08-03 MED ORDER — ATORVASTATIN CALCIUM 40 MG PO TABS
40.0000 mg | ORAL_TABLET | Freq: Every day | ORAL | Status: DC
  Administered 2022-08-03 – 2022-08-06 (×4): 40 mg via ORAL
  Filled 2022-08-03 (×4): qty 1

## 2022-08-03 MED ORDER — METOPROLOL TARTRATE 50 MG PO TABS
50.0000 mg | ORAL_TABLET | Freq: Once | ORAL | Status: AC
  Administered 2022-08-03: 50 mg via ORAL
  Filled 2022-08-03: qty 1

## 2022-08-03 MED ORDER — INSULIN ASPART 100 UNIT/ML IJ SOLN
0.0000 [IU] | Freq: Three times a day (TID) | INTRAMUSCULAR | Status: DC
  Administered 2022-08-04: 1 [IU] via SUBCUTANEOUS
  Administered 2022-08-04: 4 [IU] via SUBCUTANEOUS
  Administered 2022-08-05 (×3): 2 [IU] via SUBCUTANEOUS
  Filled 2022-08-03: qty 0.06

## 2022-08-03 MED ORDER — DICLOFENAC SODIUM 1 % EX GEL: 2.0000 g | Freq: Two times a day (BID) | CUTANEOUS | Status: DC | PRN

## 2022-08-03 MED ORDER — INSULIN GLARGINE-YFGN 100 UNIT/ML ~~LOC~~ SOLN
8.0000 [IU] | Freq: Every day | SUBCUTANEOUS | Status: DC
  Administered 2022-08-03 – 2022-08-04 (×2): 8 [IU] via SUBCUTANEOUS
  Filled 2022-08-03 (×2): qty 0.08

## 2022-08-03 MED ORDER — SACUBITRIL-VALSARTAN 24-26 MG PO TABS
1.0000 | ORAL_TABLET | Freq: Two times a day (BID) | ORAL | Status: DC
  Administered 2022-08-04 – 2022-08-07 (×6): 1 via ORAL
  Filled 2022-08-03 (×10): qty 1

## 2022-08-03 MED ORDER — ACETAMINOPHEN 650 MG RE SUPP: 650.0000 mg | Freq: Four times a day (QID) | RECTAL | Status: DC | PRN

## 2022-08-03 MED ORDER — METOPROLOL TARTRATE 50 MG PO TABS
50.0000 mg | ORAL_TABLET | Freq: Two times a day (BID) | ORAL | Status: DC
  Administered 2022-08-04 – 2022-08-05 (×3): 50 mg via ORAL
  Filled 2022-08-03 (×3): qty 1

## 2022-08-03 MED ORDER — MELATONIN 5 MG PO TABS
5.0000 mg | ORAL_TABLET | Freq: Every day | ORAL | Status: AC
  Administered 2022-08-03: 5 mg via ORAL
  Filled 2022-08-03: qty 1

## 2022-08-03 MED ORDER — SENNA 8.6 MG PO TABS: 1.0000 | ORAL_TABLET | Freq: Every day | ORAL | Status: DC | PRN

## 2022-08-03 MED ORDER — PANTOPRAZOLE SODIUM 40 MG PO TBEC
40.0000 mg | DELAYED_RELEASE_TABLET | Freq: Every day | ORAL | Status: DC
  Administered 2022-08-04 – 2022-08-07 (×4): 40 mg via ORAL
  Filled 2022-08-03 (×4): qty 1

## 2022-08-03 MED ORDER — SODIUM CHLORIDE 0.9% FLUSH
3.0000 mL | Freq: Two times a day (BID) | INTRAVENOUS | Status: DC
  Administered 2022-08-03 – 2022-08-07 (×7): 3 mL via INTRAVENOUS

## 2022-08-03 MED ORDER — AMIODARONE HCL 200 MG PO TABS
200.0000 mg | ORAL_TABLET | Freq: Every day | ORAL | Status: DC
  Administered 2022-08-04 – 2022-08-07 (×4): 200 mg via ORAL
  Filled 2022-08-03 (×4): qty 1

## 2022-08-03 MED ORDER — LATANOPROST 0.005 % OP SOLN
1.0000 [drp] | Freq: Every day | OPHTHALMIC | Status: DC
  Administered 2022-08-04 – 2022-08-06 (×3): 1 [drp] via OPHTHALMIC
  Filled 2022-08-03 (×2): qty 2.5

## 2022-08-03 MED ORDER — FUROSEMIDE 10 MG/ML IJ SOLN
40.0000 mg | Freq: Once | INTRAMUSCULAR | Status: AC
  Administered 2022-08-03: 40 mg via INTRAVENOUS
  Filled 2022-08-03: qty 4

## 2022-08-03 NOTE — ED Notes (Signed)
Nurse aware of low O2 pt moved to Res.

## 2022-08-03 NOTE — ED Triage Notes (Signed)
Pt arrives with c/o SOB and low O2 sats that started today. Per family member, pt was sent home with oxygen, but the tank is now empty. Pt was 78% on RA in triage. Pt denies CP.

## 2022-08-03 NOTE — ED Notes (Signed)
ED TO INPATIENT HANDOFF REPORT  ED Nurse Name and Phone #: Shanda Bumps RN  S Name/Age/Gender [name] 89 y.o. female Room/Bed: RESA/RESA  Code Status   Code Status: Full Code  Home/SNF/Other Home Patient oriented to: self, place, time, and situation Is this baseline? Yes   Triage Complete: Triage complete  Chief Complaint Acute on chronic diastolic CHF (congestive heart failure) [I50.33]  Triage Note Pt arrives with c/o SOB and low O2 sats that started today. Per family member, pt was sent home with oxygen, but the tank is now empty. Pt was 78% on RA in triage. Pt denies CP.    Allergies No Known Allergies  Level of Care/Admitting Diagnosis ED Disposition     ED Disposition  Admit   Condition  --   Comment  Hospital Area: [facility] [[id number]]  Level of Care: Telemetry [5]  Admit to tele based on following criteria: Acute CHF  May admit patient to [facility] or [facility] if equivalent level of care is available:: Yes  Covid Evaluation: Asymptomatic - no recent exposure (last 10 days) testing not required  Diagnosis: Acute on chronic diastolic CHF (congestive heart failure) [213086]  Admitting Physician: Briscoe Deutscher [[id number]]  Attending Physician: Briscoe Deutscher [[id number]]  Certification:: I certify this patient will need inpatient services for at least 2 midnights  Estimated Length of Stay: 3          B Medical/Surgery History Past Medical History:  Diagnosis Date   Arthritis    Atrial fibrillation    Diabetes mellitus    DVT (deep venous thrombosis)    Hyperlipidemia    Hypertension    Leg pain    Peripheral arterial disease    Stroke 1999   Past Surgical History:  Procedure Laterality Date   ABDOMINAL AORTOGRAM W/LOWER EXTREMITY N/A 07/11/2021   Procedure: ABDOMINAL AORTOGRAM W/LOWER EXTREMITY;  Surgeon: Victorino Sparrow, MD;  Location: [facility] INVASIVE CV LAB;  Service: Cardiovascular;  Laterality: N/A;   BACK SURGERY   1996   CARDIOVERSION N/A 02/15/2020   Procedure: CARDIOVERSION;  Surgeon: Elease Hashimoto, Deloris Ping, MD;  Location: [facility] ENDOSCOPY;  Service: Cardiovascular;  Laterality: N/A;   CARDIOVERSION N/A 08/07/2021   Procedure: CARDIOVERSION;  Surgeon: Maisie Fus, MD;  Location: [facility] ENDOSCOPY;  Service: Cardiovascular;  Laterality: N/A;   CATARACT EXTRACTION  2000   CESAREAN SECTION     ENDARTERECTOMY FEMORAL Left 07/26/2021   Procedure: LEFT COMMON FEMORAL ENDARTERECTOMY;  Surgeon: Victorino Sparrow, MD;  Location: [facility] OR;  Service: Vascular;  Laterality: Left;   EYE SURGERY     FEMORAL BYPASS Left July 23, 2010   LUMBAR DISC SURGERY     PATCH ANGIOPLASTY Left 07/26/2021   Procedure: PATCH ANGIOPLASTY WITH XENOSURE BIOLOGIC 1 cm x 6 cm PATCH;  Surgeon: Victorino Sparrow, MD;  Location: [facility] OR;  Service: Vascular;  Laterality: Left;   SPINE SURGERY     TEE WITHOUT CARDIOVERSION N/A 08/07/2021   Procedure: TRANSESOPHAGEAL ECHOCARDIOGRAM (TEE);  Surgeon: Maisie Fus, MD;  Location: [facility] ENDOSCOPY;  Service: Cardiovascular;  Laterality: N/A;     A IV Location/Drains/Wounds Patient Lines/Drains/Airways Status     Active Line/Drains/Airways     Name Placement date Placement time Site Days   Peripheral IV 08/03/22 20 G Right Antecubital 08/03/22  1647  Antecubital  less than 1            Intake/Output Last 24 hours No intake or output data in the  24 hours ending 08/03/22 2044  Labs/Imaging Results for orders placed or performed during the hospital encounter of 08/03/22 (from the past 48 hour(s))  CBG monitoring, ED     Status: Abnormal   Collection Time: 08/03/22  4:32 PM  Result Value Ref Range   Glucose-Capillary 285 (H) 70 - 99 mg/dL    Comment: Glucose reference range applies only to samples taken after fasting for at least 8 hours.  CBC with Differential     Status: Abnormal   Collection Time: 08/03/22  5:14 PM  Result Value Ref Range   WBC 6.9 4.0 - 10.5 K/uL   RBC 2.41 (L) 3.87 - 5.11 MIL/uL    Hemoglobin 8.3 (L) 12.0 - 15.0 g/dL   HCT 25.3 (L) 66.4 - 40.3 %   MCV 106.6 (H) 80.0 - 100.0 fL   MCH 34.4 (H) 26.0 - 34.0 pg   MCHC 32.3 30.0 - 36.0 g/dL   RDW 47.4 25.9 - 56.3 %   Platelets 292 150 - 400 K/uL   nRBC 0.0 0.0 - 0.2 %   Neutrophils Relative % 88 %   Neutro Abs 6.0 1.7 - 7.7 K/uL   Lymphocytes Relative 8 %   Lymphs Abs 0.6 (L) 0.7 - 4.0 K/uL   Monocytes Relative 4 %   Monocytes Absolute 0.3 0.1 - 1.0 K/uL   Eosinophils Relative 0 %   Eosinophils Absolute 0.0 0.0 - 0.5 K/uL   Basophils Relative 0 %   Basophils Absolute 0.0 0.0 - 0.1 K/uL   Immature Granulocytes 0 %   Abs Immature Granulocytes 0.02 0.00 - 0.07 K/uL    Comment: Performed at [facility], [address], [place], [place] [address]  Blood gas, venous     Status: Abnormal   Collection Time: 08/03/22  5:20 PM  Result Value Ref Range   pH, Ven 7.44 (H) 7.25 - 7.43   pCO2, Ven 33 (L) 44 - 60 mmHg   pO2, Ven <31 (LL) 32 - 45 mmHg    Comment: CRITICAL RESULT CALLED TO, READ BACK BY AND VERIFIED WITH: RN L SINCLAIR AT 1739 08/03/22 CRUICKSHANK A    Bicarbonate 22.4 20.0 - 28.0 mmol/L   Acid-base deficit 1.1 0.0 - 2.0 mmol/L   O2 Saturation 36.8 %   Patient temperature 37.0     Comment: Performed at [facility], [address] [place], [place] [address]   DG Chest Portable 1 View  Result Date: 08/03/2022 CLINICAL DATA:  Shortness of breath, hypoxia EXAM: PORTABLE CHEST 1 VIEW COMPARISON:  06/13/2022 FINDINGS: Heart and mediastinal contours within normal limits, stable. Moderate layering bilateral effusions with bilateral airspace disease, predominately perihilar and lower lobe. Aortic atherosclerosis. No acute bony abnormality. IMPRESSION: Moderate bilateral layering effusions with bilateral perihilar and lower lobe airspace opacities. Findings could reflect edema or infection. Findings similar to prior study. Electronically Signed   By: Charlett Nose M.D.   On:  08/03/2022 17:31    Pending Labs Unresulted Labs (From admission, onward)     Start     Ordered   08/04/22 0500  Basic metabolic panel  Daily,   R      08/03/22 2020   08/04/22 0500  Magnesium  Tomorrow morning,   R        08/03/22 2020   08/04/22 0500  CBC  Daily,   R      08/03/22 2020   08/04/22 0500  Vitamin B12  (Anemia Panel (PNL))  Tomorrow  morning,   R        08/03/22 2023   08/04/22 0500  Folate  (Anemia Panel (PNL))  Tomorrow morning,   R        08/03/22 2023   08/04/22 0500  Iron and TIBC  (Anemia Panel (PNL))  Tomorrow morning,   R        08/03/22 2023   08/04/22 0500  Ferritin  (Anemia Panel (PNL))  Tomorrow morning,   R        08/03/22 2023   08/04/22 0500  Reticulocytes  (Anemia Panel (PNL))  Tomorrow morning,   R        08/03/22 2023   08/03/22 1714  Comprehensive metabolic panel  Once,   STAT        08/03/22 1713   08/03/22 1714  Brain natriuretic peptide  Once,   URGENT        08/03/22 1713            Vitals/Pain Today's Vitals   08/03/22 1815 08/03/22 1920 08/03/22 2022 08/03/22 2030  BP:  (!) 180/84  (!) 147/57  Pulse: 72 71  69  Resp: (!) Temp:   97.7 F (36.5 C)   TempSrc:   Oral   SpO2: 91% 93%  100%    Isolation Precautions No active isolations  Medications Medications  metoprolol tartrate (LOPRESSOR) tablet 50 mg (has no administration in time range)  amiodarone (PACERONE) tablet 200 mg (has no administration in time range)  amLODipine (NORVASC) tablet 10 mg (has no administration in time range)  atorvastatin (LIPITOR) tablet 40 mg (has no administration in time range)  sacubitril-valsartan (ENTRESTO) 24-26 mg per tablet (has no administration in time range)  metoprolol tartrate (LOPRESSOR) tablet 50 mg (has no administration in time range)  pantoprazole (PROTONIX) EC tablet 40 mg (has no administration in time range)  apixaban (ELIQUIS) tablet 2.5 mg (has no administration in time range)  latanoprost (XALATAN) 0.005 %  ophthalmic solution 1 drop (has no administration in time range)  diclofenac Sodium (VOLTAREN) 1 % topical gel 2 g (has no administration in time range)  insulin glargine-yfgn (SEMGLEE) injection 8 Units (has no administration in time range)  insulin aspart (novoLOG) injection 0-6 Units (has no administration in time range)  insulin aspart (novoLOG) injection 0-5 Units (has no administration in time range)  furosemide (LASIX) injection 20 mg (has no administration in time range)  sodium chloride flush (NS) 0.9 % injection 3 mL (has no administration in time range)  acetaminophen (TYLENOL) tablet 650 mg (has no administration in time range)    Or  acetaminophen (TYLENOL) suppository 650 mg (has no administration in time range)  senna (SENOKOT) tablet 8.6 mg (has no administration in time range)  furosemide (LASIX) injection 40 mg (40 mg Intravenous Given 08/03/22 1759)    Mobility walks with device     Focused Assessments Pulmonary Assessment Handoff:  Lung sounds: Bilateral Breath Sounds: Diminished O2 Device: Room Air *8L Mid flow cannula      R Recommendations: See Admitting Provider Note  Report given to:   Additional Notes:

## 2022-08-03 NOTE — H&P (Signed)
History and Physical    [name] YNW:[id number] DOB: November 02, 1932 DOA: 08/03/2022  PCP: [name], MD   Patient coming from: Home   Chief Complaint: SOB, low O2 saturation   HPI: [name] is a 89 y.o. female with medical history significant for hypertension, hyperlipidemia, history of CVA, atrial fibrillation on Eliquis, chronic HFpEF, insulin-dependent diabetes mellitus, and PAD who presents to the emergency department with progressive shortness of breath and hypoxia.  Patient reports 2 to 3 days of progressive dyspnea, initially with any activity, but now even at rest.  She denies any chest pain, fever, or sputum production.  She reports adherence with Eliquis but had missed 2 or 3 days of her diuretics last week before she was able to pick up her refill.  She has been using 1 L/min of supplemental oxygen at home.  ED Course: Upon arrival to the ED, patient is found to be afebrile and saturating 78% on room air elevated respiratory rate and elevated blood pressure.  EKG demonstrates sinus or ectopic atrial rhythm with repolarization abnormality.  Chest x-ray notable for moderate bilateral layering effusions.  Hemoglobin is 8.3 with MCV 106.6.  CMP is in process.  Patient was treated with 40 mg IV Lasix and oral metoprolol in the ED.  Review of Systems:  All other systems reviewed and apart from HPI, are negative.  Past Medical History:  Diagnosis Date   Arthritis    Atrial fibrillation    Diabetes mellitus    DVT (deep venous thrombosis)    Hyperlipidemia    Hypertension    Leg pain    Peripheral arterial disease    Stroke 1999    Past Surgical History:  Procedure Laterality Date   ABDOMINAL AORTOGRAM W/LOWER EXTREMITY N/A 07/11/2021   Procedure: ABDOMINAL AORTOGRAM W/LOWER EXTREMITY;  Surgeon: Victorino Sparrow, MD;  Location: [facility] INVASIVE CV LAB;  Service: Cardiovascular;  Laterality: N/A;   BACK SURGERY  1996   CARDIOVERSION N/A 02/15/2020   Procedure:  CARDIOVERSION;  Surgeon: Elease Hashimoto, Deloris Ping, MD;  Location: [facility] ENDOSCOPY;  Service: Cardiovascular;  Laterality: N/A;   CARDIOVERSION N/A 08/07/2021   Procedure: CARDIOVERSION;  Surgeon: Maisie Fus, MD;  Location: [facility] ENDOSCOPY;  Service: Cardiovascular;  Laterality: N/A;   CATARACT EXTRACTION  2000   CESAREAN SECTION     ENDARTERECTOMY FEMORAL Left 07/26/2021   Procedure: LEFT COMMON FEMORAL ENDARTERECTOMY;  Surgeon: Victorino Sparrow, MD;  Location: [facility] OR;  Service: Vascular;  Laterality: Left;   EYE SURGERY     FEMORAL BYPASS Left July 23, 2010   LUMBAR DISC SURGERY     PATCH ANGIOPLASTY Left 07/26/2021   Procedure: PATCH ANGIOPLASTY WITH XENOSURE BIOLOGIC 1 cm x 6 cm PATCH;  Surgeon: Victorino Sparrow, MD;  Location: [facility] OR;  Service: Vascular;  Laterality: Left;   SPINE SURGERY     TEE WITHOUT CARDIOVERSION N/A 08/07/2021   Procedure: TRANSESOPHAGEAL ECHOCARDIOGRAM (TEE);  Surgeon: Maisie Fus, MD;  Location: [facility] ENDOSCOPY;  Service: Cardiovascular;  Laterality: N/A;    Social History:   reports that she has never smoked. She has never used smokeless tobacco. She reports that she does not drink alcohol and does not use drugs.  No Known Allergies  Family History  Problem Relation Age of Onset   Hypertension Mother    Heart attack Mother    Diabetes Father    Hypertension Father    Stroke Father    Stroke Sister    Diabetes Sister  Heart disease Sister        Before age 30-Aneurysm   Dementia Sister    Alzheimer's disease Sister    Diabetes Brother    Diabetes Brother    Heart disease Brother        Before age 35   Heart attack Brother    Heart attack Sister    Diabetes Sister    Alzheimer's disease Sister      Prior to Admission medications   Medication Sig Start Date End Date Taking? Authorizing Provider  acetaminophen (TYLENOL) 325 MG tablet Take 650 mg by mouth every 6 (six) hours as needed for mild pain or headache.   Yes [provider]  amiodarone  (PACERONE) 200 MG tablet Take 1 tablet (200 mg total) by mouth daily. 06/16/22 08/03/22 Yes Berton Mount I, MD  amLODipine (NORVASC) 10 MG tablet Take 1 tablet (10 mg total) by mouth daily. 06/26/22  Yes Lewayne Bunting, MD  atorvastatin (LIPITOR) 40 MG tablet Take 1 tablet (40 mg total) by mouth daily. Patient taking differently: Take 40 mg by mouth at bedtime. 03/22/20 08/03/22 Yes Cleaver, Thomasene Ripple, NP  diclofenac Sodium (VOLTAREN) 1 % GEL Apply 2 g topically 2 (two) times daily as needed (for pain- affected areas).   Yes [provider]  docusate sodium (COLACE) 100 MG capsule Take 100 mg by mouth 2 (two) times daily.   Yes [provider]  ELIQUIS 2.5 MG TABS tablet TAKE 1 TABLET BY MOUTH TWICE A DAY Patient taking differently: Take 2.5 mg by mouth 2 (two) times daily. 02/11/22  Yes Crenshaw, Madolyn Frieze, MD  ENTRESTO 24-26 MG Take 1 tablet by mouth 2 (two) times daily. 06/08/22  Yes [provider]  ezetimibe (ZETIA) 10 MG tablet Take 1 tablet (10 mg total) by mouth daily. Patient taking differently: Take 10 mg by mouth daily with lunch. 05/13/22  Yes Crenshaw, Madolyn Frieze, MD  furosemide (LASIX) 20 MG tablet Take 1 tablet (20 mg total) by mouth daily. 06/16/22  Yes Barnetta Chapel, MD  GINKGO BILOBA PO Take 2 tablets by mouth daily with lunch.   Yes [provider]  glipiZIDE (GLUCOTROL) 10 MG tablet Take 10 mg by mouth See admin instructions. Take 10 mg by mouth before breakfast and lunch 11/13/21  Yes [provider]  LANTUS SOLOSTAR 100 UNIT/ML Solostar Pen Inject 10 Units into the skin at bedtime. 06/15/22  Yes Berton Mount I, MD  latanoprost (XALATAN) 0.005 % ophthalmic solution Place 1 drop into both eyes at bedtime.   Yes [provider]  metFORMIN (GLUCOPHAGE) 500 MG tablet Take 1 tablet (500 mg total) by mouth 2 (two) times daily. 06/15/22 08/14/22 Yes Berton Mount I, MD  metoprolol tartrate (LOPRESSOR) 50 MG tablet TAKE 1 TABLET BY  MOUTH TWICE A DAY Patient taking differently: Take 50 mg by mouth See admin instructions. Take 50 mg by mouth in the morning and evening 07/18/22  Yes Crenshaw, Madolyn Frieze, MD  Multiple Vitamins-Minerals (CENTRUM SILVER ULTRA WOMENS) TABS Take 1 tablet by mouth at bedtime.   Yes [provider]  Omega-3 Fatty Acids (FISH OIL) 1200 MG CAPS Take 1,200 mg by mouth daily with lunch.   Yes [provider]  ondansetron (ZOFRAN) 4 MG tablet Take 4 mg by mouth every 8 (eight) hours as needed for nausea or vomiting.   Yes [provider]  pantoprazole (PROTONIX) 40 MG tablet Take 40 mg by mouth daily before breakfast. 05/07/22  Yes [provider]  ACCU-CHEK SOFTCLIX LANCETS lancets  01/16/18   [provider]  docusate (COLACE) 50 MG/5ML liquid Take 20 mLs (200 mg total) by mouth 2 (two) times daily. Patient not taking: Reported on 08/03/2022 06/15/22   Berton Mount I, MD  iron polysaccharides (NIFEREX) 150 MG capsule Take 150 mg by mouth daily.    07/04/11  [provider]  topiramate (TOPAMAX) 15 MG capsule Take 15 mg by mouth 2 (two) times daily.    07/04/11  [provider]    Physical Exam: Vitals:   08/03/22 1745 08/03/22 1800 08/03/22 1815 08/03/22 1920  BP: (!) 153/65 (!) 167/70  (!) 180/84  Pulse: 71 72 72 71  Resp: (!) 23 (!) 24 (!) 22 17  Temp:      TempSrc:      SpO2: 95% (!) 86% 91% 93%    Constitutional: NAD, no pallor or diaphoresis  Eyes: PERTLA, lids and conjunctivae normal ENMT: Mucous membranes are moist. Posterior pharynx clear of any exudate or lesions.   Neck: supple, no masses  Respiratory: no wheezing. No accessory muscle use.  Cardiovascular: S1 & S2 heard, regular rate and rhythm. Bilateral lower extremity edema.  Abdomen: No distension, no tenderness, soft. Bowel sounds active.  Musculoskeletal: no clubbing / cyanosis. No joint deformity upper and lower extremities.   Skin: no significant rashes, lesions,  ulcers. Warm, dry, well-perfused. Neurologic: CN 2-12 grossly intact. Moving all extremities. Alert and oriented.  Psychiatric: Calm. Cooperative.    Labs and Imaging on Admission: I have personally reviewed following labs and imaging studies  CBC: Recent Labs  Lab 08/03/22 1714  WBC 6.9  NEUTROABS 6.0  HGB 8.3*  HCT 25.7*  MCV 106.6*  PLT 292   Basic Metabolic Panel: No results for input(s): "NA", "K", "CL", "CO2", "GLUCOSE", "BUN", "CREATININE", "CALCIUM", "MG", "PHOS" in the last 168 hours. GFR: CrCl cannot be calculated (Patient's most recent lab result is older than the maximum 21 days allowed.). Liver Function Tests: No results for input(s): "AST", "ALT", "ALKPHOS", "BILITOT", "PROT", "ALBUMIN" in the last 168 hours. No results for input(s): "LIPASE", "AMYLASE" in the last 168 hours. No results for input(s): "AMMONIA" in the last 168 hours. Coagulation Profile: No results for input(s): "INR", "PROTIME" in the last 168 hours. Cardiac Enzymes: No results for input(s): "CKTOTAL", "CKMB", "CKMBINDEX", "TROPONINI" in the last 168 hours. BNP (last 3 results) No results for input(s): "PROBNP" in the last 8760 hours. HbA1C: No results for input(s): "HGBA1C" in the last 72 hours. CBG: Recent Labs  Lab 08/03/22 1632  GLUCAP 285*   Lipid Profile: No results for input(s): "CHOL", "HDL", "LDLCALC", "TRIG", "CHOLHDL", "LDLDIRECT" in the last 72 hours. Thyroid Function Tests: No results for input(s): "TSH", "T4TOTAL", "FREET4", "T3FREE", "THYROIDAB" in the last 72 hours. Anemia Panel: No results for input(s): "VITAMINB12", "FOLATE", "FERRITIN", "TIBC", "IRON", "RETICCTPCT" in the last 72 hours. Urine analysis:    Component Value Date/Time   COLORURINE COLORLESS (A) 07/28/2021 1742   APPEARANCEUR CLEAR 07/28/2021 1742   LABSPEC 1.003 (L) 07/28/2021 1742   PHURINE 9.0 (H) 07/28/2021 1742   GLUCOSEU NEGATIVE 07/28/2021 1742   HGBUR NEGATIVE 07/28/2021 1742   BILIRUBINUR  NEGATIVE 07/28/2021 1742   KETONESUR NEGATIVE 07/28/2021 1742   PROTEINUR NEGATIVE 07/28/2021 1742   UROBILINOGEN 0.2 10/28/2007 0214   NITRITE NEGATIVE 07/28/2021 1742   LEUKOCYTESUR NEGATIVE 07/28/2021 1742   Sepsis Labs: (procalcitonin:4,lacticidven:4) )No results found for this or any previous visit (from the past 240 hour(s)).  Radiological Exams on Admission: DG Chest Portable 1 View  Result Date: 08/03/2022 CLINICAL DATA:  Shortness of breath, hypoxia EXAM: PORTABLE CHEST 1 VIEW COMPARISON:  06/13/2022 FINDINGS: Heart and mediastinal contours within normal limits, stable. Moderate layering bilateral effusions with bilateral airspace disease, predominately perihilar and lower lobe. Aortic atherosclerosis. No acute bony abnormality. IMPRESSION: Moderate bilateral layering effusions with bilateral perihilar and lower lobe airspace opacities. Findings could reflect edema or infection. Findings similar to prior study. Electronically Signed   By: Charlett Nose M.D.   On: 08/03/2022 17:31    EKG: Independently reviewed. Sinus or ectopic atrial rhythm, repolarization abnormality.   Assessment/Plan   1. Acute on chronic HFpEF; acute on chronic hypoxic respiratory failure  - Presents with progressive SOB and PND, found to have increased supplemental O2 requirement, given IV Lasix in ED  - EF was preserved on TTE from March 2024  - Continue diuresis with IV Lasix, continue beta-blocker and Entresto, follow daily wt and I/Os, monitor renal function and electrolytes while diuresing   2. Atrial fibrillation  - Continue amiodarone, metoprolol, and Eliquis    3. Type II DM  - A1c was 7.6% in February 2024  - Check CBGs and continue insulin    4. Hypertension  - BP elevated in ED, anticipate improvement with diuresis  - Continue Norvasc and metoprolol     5. CKD IIIa  - Lab is having problems today and chem panel still in process, will need to follow electrolytes and renal  function closely while diuresing    6. PAD  - No acute ischemia  - Continue Eliquis   7. Anemia  - Hgb 8.3 on admission, down from 8.9 last month without overt bleeding  - Check anemia panel    8. Pleural effusions  - No infectious s/s, likely from CHF  - Consider thoracentesis if does not resolve with diuresis    DVT prophylaxis: Eliquis  Code Status: Full  Level of Care: Level of care: Telemetry Family Communication: Daughter updated from ED  Disposition Plan:  Patient is from: home  Anticipated d/c is to: Home  Anticipated d/c date is: 08/06/22  Patient currently: Pending improved/stable respiratory and volume status  Consults called: None  Admission status: Inpatient     Briscoe Deutscher, MD [facility]  08/03/2022, 8:20 PM

## 2022-08-03 NOTE — ED Provider Notes (Signed)
[place] EMERGENCY DEPARTMENT AT [facility] Provider Note   CSN: [id number] Arrival date & time: 08/03/22  1611     History  Chief Complaint  Patient presents with   Shortness of Breath    [name] is a 89 y.o. female.  With PMH of DM 2, HTN, HLD, A-fib on Eliquis, CHF with recent admission and discharged from the hospital June 15, 2022 after CHF exacerbation presents with worsening shortness of breath.  Patient here with her daughter who helps provide history.  She has been having worsening shortness of breath over the past couple of days.  Mainly worse with exertion and orthopnea and PND.  She does not sleep flat.  She sleeps in recliner.  She is had a stable weight per patient and patient's family.  She has swelling of bilateral lower extremities and is unsure if this is worse than normal.  She has no pain in her chest or abdomen.  No fevers, no sore throat, no cough, no hemoptysis.  She is compliant with her Eliquis and denies any bleeding.  She has been off of her oxygen because the oxygen ran out with her canister but should only be on 1 L.  She has also been off of her diuretics for 2 to 3 days last week but just got a refill from her PCP is taking 20 mg daily.   Shortness of Breath      Home Medications Prior to Admission medications   Medication Sig Start Date End Date Taking? Authorizing Provider  acetaminophen (TYLENOL) 325 MG tablet Take 650 mg by mouth every 6 (six) hours as needed for mild pain or headache.   Yes [provider]  amiodarone (PACERONE) 200 MG tablet Take 1 tablet (200 mg total) by mouth daily. 06/16/22 08/03/22 Yes Berton Mount I, MD  amLODipine (NORVASC) 10 MG tablet Take 1 tablet (10 mg total) by mouth daily. 06/26/22  Yes Lewayne Bunting, MD  atorvastatin (LIPITOR) 40 MG tablet Take 1 tablet (40 mg total) by mouth daily. Patient taking differently: Take 40 mg by mouth at bedtime. 03/22/20 08/03/22 Yes Cleaver, Thomasene Ripple, NP   diclofenac Sodium (VOLTAREN) 1 % GEL Apply 2 g topically 2 (two) times daily as needed (for pain- affected areas).   Yes [provider]  docusate sodium (COLACE) 100 MG capsule Take 100 mg by mouth 2 (two) times daily.   Yes [provider]  ELIQUIS 2.5 MG TABS tablet TAKE 1 TABLET BY MOUTH TWICE A DAY Patient taking differently: Take 2.5 mg by mouth 2 (two) times daily. 02/11/22  Yes Crenshaw, Madolyn Frieze, MD  ENTRESTO 24-26 MG Take 1 tablet by mouth 2 (two) times daily. 06/08/22  Yes [provider]  ezetimibe (ZETIA) 10 MG tablet Take 1 tablet (10 mg total) by mouth daily. Patient taking differently: Take 10 mg by mouth daily with lunch. 05/13/22  Yes Crenshaw, Madolyn Frieze, MD  furosemide (LASIX) 20 MG tablet Take 1 tablet (20 mg total) by mouth daily. 06/16/22  Yes Barnetta Chapel, MD  GINKGO BILOBA PO Take 2 tablets by mouth daily with lunch.   Yes [provider]  glipiZIDE (GLUCOTROL) 10 MG tablet Take 10 mg by mouth See admin instructions. Take 10 mg by mouth before breakfast and lunch 11/13/21  Yes [provider]  LANTUS SOLOSTAR 100 UNIT/ML Solostar Pen Inject 10 Units into the skin at bedtime. 06/15/22  Yes Barnetta Chapel, MD  latanoprost (XALATAN) 0.005 %  ophthalmic solution Place 1 drop into both eyes at bedtime.   Yes [provider]  metFORMIN (GLUCOPHAGE) 500 MG tablet Take 1 tablet (500 mg total) by mouth 2 (two) times daily. 06/15/22 08/14/22 Yes Berton Mount I, MD  metoprolol tartrate (LOPRESSOR) 50 MG tablet TAKE 1 TABLET BY MOUTH TWICE A DAY Patient taking differently: Take 50 mg by mouth See admin instructions. Take 50 mg by mouth in the morning and evening 07/18/22  Yes Crenshaw, Madolyn Frieze, MD  Multiple Vitamins-Minerals (CENTRUM SILVER ULTRA WOMENS) TABS Take 1 tablet by mouth at bedtime.   Yes [provider]  Omega-3 Fatty Acids (FISH OIL) 1200 MG CAPS Take 1,200 mg by mouth daily with lunch.   Yes [provider]  ondansetron (ZOFRAN) 4 MG tablet Take 4 mg by mouth every 8 (eight) hours as needed for nausea or vomiting.   Yes [provider]  pantoprazole (PROTONIX) 40 MG tablet Take 40 mg by mouth daily before breakfast. 05/07/22  Yes [provider]  ACCU-CHEK SOFTCLIX LANCETS lancets  01/16/18   [provider]  docusate (COLACE) 50 MG/5ML liquid Take 20 mLs (200 mg total) by mouth 2 (two) times daily. Patient not taking: Reported on 08/03/2022 06/15/22   Berton Mount I, MD  iron polysaccharides (NIFEREX) 150 MG capsule Take 150 mg by mouth daily.    07/04/11  [provider]  topiramate (TOPAMAX) 15 MG capsule Take 15 mg by mouth 2 (two) times daily.    07/04/11  [provider]      Allergies    Patient has no known allergies.    Review of Systems   Review of Systems  Respiratory:  Positive for shortness of breath.     Physical Exam Updated Vital Signs BP (!) 178/58 (BP Location: Left Arm)   Pulse 82   Temp 97.7 F (36.5 C) (Oral)   Resp (!) 24   Ht 5' (1.524 m)   Wt 39.9 kg   SpO2 (!) 88%   BMI 17.18 kg/m  Physical Exam Constitutional: Alert and oriented.  Frail and fatigued appearing but nontoxic Eyes: Conjunctivae are normal. ENT      Head: Normocephalic and atraumatic.      Nose: No congestion.      Neck: No stridor. Cardiovascular: Irregularly irregular rhythm with regular rates.Warm and well perfused. Respiratory: Mildly tachypneic with decreased breath sounds and crackles at bases.  O2 sat 80% on room air improved to mid 90s on high flow nasal cannula Gastrointestinal: Soft and nontender.  Musculoskeletal: Normal range of motion in all extremities. 2+ equal nontender nonerythematous pitting edema extending from feet to knee bilaterally Neurologic: Normal speech and language.  No facial droop.  Moving all 4 extremities. Skin: Skin is warm, dry and intact. No rash noted. Psychiatric: Mood and affect are normal.  Speech and behavior are normal.  ED Results / Procedures / Treatments   Labs (all labs ordered are listed, but only abnormal results are displayed) Labs Reviewed  CBC WITH DIFFERENTIAL/PLATELET - Abnormal; Notable for the following components:      Result Value   RBC 2.41 (*)    Hemoglobin 8.3 (*)    HCT 25.7 (*)    MCV 106.6 (*)    MCH 34.4 (*)    Lymphs Abs 0.6 (*)    All other components within normal limits  BRAIN NATRIURETIC PEPTIDE - Abnormal; Notable for the following components:   B Natriuretic Peptide 1,269.5 (*)    All  other components within normal limits  BLOOD GAS, VENOUS - Abnormal; Notable for the following components:   pH, Ven 7.44 (*)    pCO2, Ven 33 (*)    pO2, Ven <31 (*)    All other components within normal limits  GLUCOSE, CAPILLARY - Abnormal; Notable for the following components:   Glucose-Capillary 312 (*)    All other components within normal limits  CBG MONITORING, ED - Abnormal; Notable for the following components:   Glucose-Capillary 285 (*)    All other components within normal limits  TROPONIN I (HIGH SENSITIVITY) - Abnormal; Notable for the following components:   Troponin I (High Sensitivity) 19 (*)    All other components within normal limits  COMPREHENSIVE METABOLIC PANEL  BASIC METABOLIC PANEL  MAGNESIUM  CBC  VITAMIN B12  FOLATE  IRON AND TIBC  FERRITIN  RETICULOCYTES  BASIC METABOLIC PANEL  MAGNESIUM  TROPONIN I (HIGH SENSITIVITY)    EKG EKG Interpretation  Date/Time:  Saturday August 03 2022 16:28:12 EDT Ventricular Rate:  74 PR Interval:  164 QRS Duration: 78 QT Interval:  424 QTC Calculation: 471 R Axis:   71 Text Interpretation: Sinus or ectopic atrial rhythm RSR' in V1 or V2, probably normal variant Nonspecific repol abnormality, diffuse leads No significant change since last tracing no stemi Confirmed by Vivien Rossetti ([id number]) on 08/03/2022 5:18:06 PM  Radiology DG Chest Portable 1 View  Result Date:  08/03/2022 CLINICAL DATA:  Shortness of breath, hypoxia EXAM: PORTABLE CHEST 1 VIEW COMPARISON:  06/13/2022 FINDINGS: Heart and mediastinal contours within normal limits, stable. Moderate layering bilateral effusions with bilateral airspace disease, predominately perihilar and lower lobe. Aortic atherosclerosis. No acute bony abnormality. IMPRESSION: Moderate bilateral layering effusions with bilateral perihilar and lower lobe airspace opacities. Findings could reflect edema or infection. Findings similar to prior study. Electronically Signed   By: Charlett Nose M.D.   On: 08/03/2022 17:31    Procedures .Critical Care  Performed by: Mardene Sayer, MD Authorized by: Mardene Sayer, MD   Critical care provider statement:    Critical care time (minutes):  45   Critical care was necessary to treat or prevent imminent or life-threatening deterioration of the following conditions:  Respiratory failure and cardiac failure   Critical care was time spent personally by me on the following activities:  Development of treatment plan with patient or surrogate, evaluation of patient's response to treatment, examination of patient, ordering and review of laboratory studies, ordering and review of radiographic studies, ordering and performing treatments and interventions, pulse oximetry, re-evaluation of patient's condition, review of old charts and obtaining history from patient or surrogate   Care discussed with: admitting provider       Medications Ordered in ED Medications  amiodarone (PACERONE) tablet 200 mg (has no administration in time range)  amLODipine (NORVASC) tablet 10 mg (has no administration in time range)  atorvastatin (LIPITOR) tablet 40 mg (40 mg Oral Given 08/03/22 2252)  sacubitril-valsartan (ENTRESTO) 24-26 mg per tablet (has no administration in time range)  metoprolol tartrate (LOPRESSOR) tablet 50 mg (has no administration in time range)  pantoprazole (PROTONIX) EC tablet 40  mg (has no administration in time range)  apixaban (ELIQUIS) tablet 2.5 mg (2.5 mg Oral Given 08/03/22 2252)  latanoprost (XALATAN) 0.005 % ophthalmic solution 1 drop (has no administration in time range)  diclofenac Sodium (VOLTAREN) 1 % topical gel 2 g (has no administration in time range)  insulin glargine-yfgn (SEMGLEE) injection 8 Units (8 Units Subcutaneous Given  08/03/22 2254)  insulin aspart (novoLOG) injection 0-6 Units (has no administration in time range)  insulin aspart (novoLOG) injection 0-5 Units (4 Units Subcutaneous Given 08/03/22 2253)  furosemide (LASIX) injection 20 mg (has no administration in time range)  sodium chloride flush (NS) 0.9 % injection 3 mL (3 mLs Intravenous Given 08/03/22 2254)  acetaminophen (TYLENOL) tablet 650 mg (has no administration in time range)    Or  acetaminophen (TYLENOL) suppository 650 mg (has no administration in time range)  senna (SENOKOT) tablet 8.6 mg (has no administration in time range)  furosemide (LASIX) injection 40 mg (40 mg Intravenous Given 08/03/22 1759)  metoprolol tartrate (LOPRESSOR) tablet 50 mg (50 mg Oral Given 08/03/22 2253)  melatonin tablet 5 mg (5 mg Oral Given 08/03/22 2252)    ED Course/ Medical Decision Making/ A&P Clinical Course as of 08/03/22 2328  Sat Aug 03, 2022  2013 Spoke with Dr. Antionette Char, pending troponin 5 not concern for ACS as EKG unchanged and no chest pain, CMP in process.  Will admit for CHF exacerbation needs continued diuresis new oxygen requirement repeat echocardiogram.  Significant issues with labs and lab processing and ED.  Labs have been pending since 5 PM. [VB]    Clinical Course User Index [VB] Mardene Sayer, MD                             Medical Decision Making DONYE DAUENHAUER is a 89 y.o. female.  With PMH of DM 2, HTN, HLD, A-fib on Eliquis, CHF with recent admission and discharged from the hospital June 15, 2022 after CHF exacerbation presents with worsening shortness of breath.     Patient presents with hypoxic respiratory failure satting 80% on room air transition to high flow nasal cannula with improvement.  I suspect her symptoms are acute on chronic worsening heart failure.  Worsening peripheral edema, orthopnea and evidence of bilateral pleural effusions on chest x-ray.  BNP elevated 1269.  I doubt superimposed infection with no fever, no infectious symptoms normal white blood cell count 6.9.  I doubt PE as there is no signs of DVT on exam and she is compliant with her Eliquis.  Labs reviewed with normal white blood cell count 6.9 no left shift present.  Steady macrocytic anemia hemoglobin 8.3.  Started IV diuresis with IV Lasix 40 mg.  Discussed with Dr. Antionette Char for admission for continued IV diuresis and management of CHF exacerbation and new oxygen requirement.  Amount and/or Complexity of Data Reviewed Labs: ordered. Radiology: ordered.  Risk Prescription drug management. Decision regarding hospitalization.     Final Clinical Impression(s) / ED Diagnoses Final diagnoses:  Hypoxia  Acute on chronic congestive heart failure, unspecified heart failure type    Rx / DC Orders ED Discharge Orders     None         Mardene Sayer, MD 08/03/22 2328

## 2022-08-04 ENCOUNTER — Inpatient Hospital Stay (HOSPITAL_COMMUNITY): Payer: Medicare HMO

## 2022-08-04 DIAGNOSIS — I5033 Acute on chronic diastolic (congestive) heart failure: Secondary | ICD-10-CM | POA: Diagnosis not present

## 2022-08-04 LAB — BASIC METABOLIC PANEL
Anion gap: 14 (ref 5–15)
Anion gap: 9 (ref 5–15)
Anion gap: 13 (ref 5–15)
BUN: 29 mg/dL — ABNORMAL HIGH (ref 8–23)
BUN: 25 mg/dL — ABNORMAL HIGH (ref 8–23)
BUN: 31 mg/dL — ABNORMAL HIGH (ref 8–23)
CO2: 25 mmol/L (ref 22–32)
CO2: 26 mmol/L (ref 22–32)
CO2: 24 mmol/L (ref 22–32)
Calcium: 9.6 mg/dL (ref 8.9–10.3)
Calcium: 9.3 mg/dL (ref 8.9–10.3)
Calcium: 9.1 mg/dL (ref 8.9–10.3)
Chloride: 99 mmol/L (ref 98–111)
Chloride: 107 mmol/L (ref 98–111)
Chloride: 102 mmol/L (ref 98–111)
Creatinine, Ser: 1.32 mg/dL — ABNORMAL HIGH (ref 0.44–1.00)
Creatinine, Ser: 1.13 mg/dL — ABNORMAL HIGH (ref 0.44–1.00)
Creatinine, Ser: 1.56 mg/dL — ABNORMAL HIGH (ref 0.44–1.00)
GFR, Estimated: 39 mL/min — ABNORMAL LOW (ref >60)
GFR, Estimated: 47 mL/min — ABNORMAL LOW (ref >60)
GFR, Estimated: 32 mL/min — ABNORMAL LOW (ref >60)
Glucose, Bld: 358 mg/dL — ABNORMAL HIGH (ref 70–99)
Glucose, Bld: 47 mg/dL — ABNORMAL LOW (ref 70–99)
Glucose, Bld: 181 mg/dL — ABNORMAL HIGH (ref 70–99)
Potassium: 3.6 mmol/L (ref 3.5–5.1)
Potassium: 2.8 mmol/L — ABNORMAL LOW (ref 3.5–5.1)
Potassium: 4 mmol/L (ref 3.5–5.1)
Sodium: 138 mmol/L (ref 135–145)
Sodium: 142 mmol/L (ref 135–145)
Sodium: 139 mmol/L (ref 135–145)

## 2022-08-04 LAB — COMPREHENSIVE METABOLIC PANEL
ALT: 87 U/L — ABNORMAL HIGH (ref 0–44)
AST: 92 U/L — ABNORMAL HIGH (ref 15–41)
Albumin: 3.8 g/dL (ref 3.5–5.0)
Alkaline Phosphatase: 131 U/L — ABNORMAL HIGH (ref 38–126)
Anion gap: 17 — ABNORMAL HIGH (ref 5–15)
BUN: 26 mg/dL — ABNORMAL HIGH (ref 8–23)
CO2: 16 mmol/L — ABNORMAL LOW (ref 22–32)
Calcium: 9.6 mg/dL (ref 8.9–10.3)
Chloride: 104 mmol/L (ref 98–111)
Creatinine, Ser: 1.34 mg/dL — ABNORMAL HIGH (ref 0.44–1.00)
GFR, Estimated: 38 mL/min — ABNORMAL LOW (ref >60)
Glucose, Bld: 309 mg/dL — ABNORMAL HIGH (ref 70–99)
Potassium: 3.4 mmol/L — ABNORMAL LOW (ref 3.5–5.1)
Sodium: 137 mmol/L (ref 135–145)
Total Bilirubin: 0.9 mg/dL (ref 0.3–1.2)
Total Protein: 7.4 g/dL (ref 6.5–8.1)

## 2022-08-04 LAB — MAGNESIUM
Magnesium: 1.8 mg/dL (ref 1.7–2.4)
Magnesium: 2 mg/dL (ref 1.7–2.4)

## 2022-08-04 LAB — RETICULOCYTES
Immature Retic Fract: 36.2 % — ABNORMAL HIGH (ref 2.3–15.9)
RBC.: 2.4 MIL/uL — ABNORMAL LOW (ref 3.87–5.11)
Retic Count, Absolute: 122.9 10*3/uL (ref 19.0–186.0)
Retic Ct Pct: 5.1 % — ABNORMAL HIGH (ref 0.4–3.1)

## 2022-08-04 LAB — IRON AND TIBC
Iron: 37 ug/dL (ref 28–170)
Saturation Ratios: 9 % — ABNORMAL LOW (ref 10.4–31.8)
TIBC: 393 ug/dL (ref 250–450)
UIBC: 356 ug/dL

## 2022-08-04 LAB — CBC
HCT: 25.2 % — ABNORMAL LOW (ref 36.0–46.0)
Hemoglobin: 8.3 g/dL — ABNORMAL LOW (ref 12.0–15.0)
MCH: 35 pg — ABNORMAL HIGH (ref 26.0–34.0)
MCHC: 32.9 g/dL (ref 30.0–36.0)
MCV: 106.3 fL — ABNORMAL HIGH (ref 80.0–100.0)
Platelets: 285 10*3/uL (ref 150–400)
RBC: 2.37 MIL/uL — ABNORMAL LOW (ref 3.87–5.11)
RDW: 15.7 % — ABNORMAL HIGH (ref 11.5–15.5)
WBC: 9.3 10*3/uL (ref 4.0–10.5)
nRBC: 0 % (ref 0.0–0.2)

## 2022-08-04 LAB — FOLATE: Folate: 35 ng/mL (ref >5.9)

## 2022-08-04 LAB — GLUCOSE, CAPILLARY
Glucose-Capillary: 149 mg/dL — ABNORMAL HIGH (ref 70–99)
Glucose-Capillary: 77 mg/dL (ref 70–99)
Glucose-Capillary: 316 mg/dL — ABNORMAL HIGH (ref 70–99)
Glucose-Capillary: 182 mg/dL — ABNORMAL HIGH (ref 70–99)
Glucose-Capillary: 301 mg/dL — ABNORMAL HIGH (ref 70–99)

## 2022-08-04 LAB — TROPONIN I (HIGH SENSITIVITY): Troponin I (High Sensitivity): 17 ng/L (ref <18)

## 2022-08-04 LAB — FERRITIN: Ferritin: 82 ng/mL (ref 11–307)

## 2022-08-04 LAB — VITAMIN B12: Vitamin B-12: 258 pg/mL (ref 180–914)

## 2022-08-04 MED ORDER — FUROSEMIDE 10 MG/ML IJ SOLN
40.0000 mg | Freq: Two times a day (BID) | INTRAMUSCULAR | Status: DC
  Administered 2022-08-04 – 2022-08-05 (×3): 40 mg via INTRAVENOUS
  Filled 2022-08-04 (×3): qty 4

## 2022-08-04 MED ORDER — NITROGLYCERIN 2 % TD OINT
1.0000 [in_us] | TOPICAL_OINTMENT | TRANSDERMAL | Status: AC
  Administered 2022-08-04: 1 [in_us] via TOPICAL
  Filled 2022-08-04: qty 1

## 2022-08-04 MED ORDER — POTASSIUM CHLORIDE CRYS ER 20 MEQ PO TBCR
40.0000 meq | EXTENDED_RELEASE_TABLET | ORAL | Status: AC
  Administered 2022-08-04 (×2): 40 meq via ORAL
  Filled 2022-08-04 (×2): qty 2

## 2022-08-04 MED ORDER — POTASSIUM CHLORIDE 10 MEQ/100ML IV SOLN: 10.0000 meq | INTRAVENOUS | Status: DC

## 2022-08-04 MED ORDER — FUROSEMIDE 10 MG/ML IJ SOLN
40.0000 mg | Freq: Once | INTRAMUSCULAR | Status: AC
  Administered 2022-08-04: 40 mg via INTRAVENOUS
  Filled 2022-08-04: qty 4

## 2022-08-04 NOTE — TOC Progression Note (Signed)
Transition of Care [facility]) - Progression Note    Patient Details  Name: [name] MRN: [id number] Date of Birth: Aug 12, 1932  Transition of [facility] CM/SW Contact  Georgie Chard, [place] Phone Number: 08/04/2022, 2:36 PM  Clinical Narrative:    Consult for SNF this CSW has requested provider put in PT order to consider for possible SNF placement. Initial note stills needs to be completed awaiting PT EVAL. TOC will follow up tomorrow.        Expected Discharge Plan and Services                                               Social Determinants of Health (SDOH) Interventions SDOH Screenings   Food Insecurity: Patient Declined (08/04/2022)  Housing: Low Risk  (08/04/2022)  Transportation Needs: Patient Declined (08/04/2022)  Utilities: Patient Declined (08/04/2022)  Alcohol Screen: Low Risk  (06/13/2021)  Depression (PHQ2-9): Low Risk  (06/13/2021)  Financial Resource Strain: Low Risk  (01/31/2020)  Physical Activity: Sufficiently Active (01/31/2020)  Stress: No Stress Concern Present (08/30/2019)  Tobacco Use: Low Risk  (08/03/2022)    Readmission Risk Interventions     No data to display

## 2022-08-04 NOTE — Plan of Care (Signed)
  Problem: Fluid Volume: Goal: Ability to maintain a balanced intake and output will improve Outcome: Progressing   Problem: Nutritional: Goal: Maintenance of adequate nutrition will improve Outcome: Progressing   Problem: Tissue Perfusion: Goal: Adequacy of tissue perfusion will improve Outcome: Progressing   Problem: Education: Goal: Knowledge of General Education information will improve Description: Including pain rating scale, medication(s)/side effects and non-pharmacologic comfort measures Outcome: Progressing   

## 2022-08-04 NOTE — Progress Notes (Signed)
PROGRESS NOTE    [name]  ZOX:[id number]  DOB: 1932-09-03  DOA: 08/03/2022 PCP: [name], MD Outpatient Specialists:   Hospital course:   89 y.o. female with medical history significant for hypertension, hyperlipidemia, history of CVA, atrial fibrillation on Eliquis, chronic HFpEF, insulin-dependent diabetes mellitus, and PAD was admitted for acute hypoxic respiratory failure secondary to decompensated HFpEF.  Workup in ED revealed BNP 1269 and chest x-ray with bilateral pleural effusions.  Subjective:  Patient states she is sleepy.  Notes she is usually on 1 L of oxygen but has had to increase it.  States she is comfortable at present without shortness of breath at rest (she is on 5 L at present).   Objective: Vitals:   08/04/22 0203 08/04/22 0215 08/04/22 0500 08/04/22 0553  BP: (!) 131/58   (!) 147/81  Pulse: 69   67  Resp: 17   (!) 25  Temp: 97.8 F (36.6 C)   98.3 F (36.8 C)  TempSrc: Oral   Oral  SpO2: (!) 89% 95%  92%  Weight:   39.9 kg   Height:        Intake/Output Summary (Last 24 hours) at 08/04/2022 8119 Last data filed at 08/04/2022 0209 Gross per 24 hour  Intake --  Output 700 ml  Net -700 ml   Filed Weights   08/03/22 2147 08/04/22 0500  Weight: 39.9 kg 39.9 kg     Exam:  General: Thin patient lying on right side on flat bed with an ARD, normal RR unlabored. Eyes: sclera anicteric, conjuctiva mild injection bilaterally CVS: S1-S2, regular  Respiratory:  decreased air entry bilaterally, she does have coarse crackles at bases bilaterally.   GI: NABS, soft, NT  LE: Warm and well-perfused, 1+ edema bilaterally Neuro: A/O x 3,  grossly nonfocal.  Psych: patient is logical and coherent, judgement and insight appear normal, mood and affect appropriate to situation.  Data Reviewed:  Basic Metabolic Panel: Recent Labs  Lab 08/03/22 1714 08/03/22 2237 08/04/22 0344  NA 137 138 142  K 3.4* 3.6 2.8*  CL 104 99 107  CO2 16* 25 26   GLUCOSE 309* 358* 47*  BUN 26* 29* 25*  CREATININE 1.34* 1.32* 1.13*  CALCIUM 9.6 9.6 9.3  MG  --  1.8 2.0    CBC: Recent Labs  Lab 08/03/22 1714 08/04/22 0344  WBC 6.9 9.3  NEUTROABS 6.0  --   HGB 8.3* 8.3*  HCT 25.7* 25.2*  MCV 106.6* 106.3*  PLT 292 285     Scheduled Meds:  amiodarone  200 mg Oral Daily   amLODipine  10 mg Oral Daily   apixaban  2.5 mg Oral BID   atorvastatin  40 mg Oral QHS   furosemide  20 mg Intravenous Q12H   insulin aspart  0-5 Units Subcutaneous QHS   insulin aspart  0-6 Units Subcutaneous TID WC   insulin glargine-yfgn  8 Units Subcutaneous QHS   latanoprost  1 drop Both Eyes QHS   metoprolol tartrate  50 mg Oral BID   pantoprazole  40 mg Oral QAC breakfast   sacubitril-valsartan  1 tablet Oral BID   sodium chloride flush  3 mL Intravenous Q12H   Continuous Infusions:  potassium chloride       Assessment & Plan:   Acute hypoxic respiratory failure Decompensated HFpEF Patient presently requiring 5 L O2, his baseline on 1 L O2 at home BNP 1269 on admission yesterday, CXR with bilateral pleural effusions Agree with  assessment to not start antibiotics given no fever or leukocytosis Continue Lasix 40 IV twice daily (on Lasix 20 p.o. daily at home) Follow electrolytes, strict I's and O's Consider thoracentesis if not improved with diuresis alone  Continue amlodipine, metoprolol and Entresto per home doses  Hypokalemia Potassium 2.8 this morning, down from potassium 3.6 yesterday KCl 10 M EQ x 6 ordered Recheck potassium at 4 PM and continue repletion as warranted Magnesium is 2.0  Atrial fibrillation Rate is well-controlled on metoprolol Continue Eliquis for stroke prevention  DM 2 Blood sugars and mid 200s on SSI and Lantus 8 units Can consider increasing Lantus to 10 units tomorrow if blood sugars remain elevated  HTN Normalized with diuresis and reinstitution of BP meds  CKD 3A Creatinine is at baseline     DVT  prophylaxis: Eliquis Code Status: Full Family Communication: None today Disposition Plan:   Patient is from: Home  Anticipated Discharge Location: Home     Studies: DG Chest Portable 1 View  Result Date: 08/03/2022 CLINICAL DATA:  Shortness of breath, hypoxia EXAM: PORTABLE CHEST 1 VIEW COMPARISON:  06/13/2022 FINDINGS: Heart and mediastinal contours within normal limits, stable. Moderate layering bilateral effusions with bilateral airspace disease, predominately perihilar and lower lobe. Aortic atherosclerosis. No acute bony abnormality. IMPRESSION: Moderate bilateral layering effusions with bilateral perihilar and lower lobe airspace opacities. Findings could reflect edema or infection. Findings similar to prior study. Electronically Signed   By: Charlett Nose M.D.   On: 08/03/2022 17:31    Principal Problem:   Acute on chronic diastolic CHF (congestive heart failure) Active Problems:   Type 2 diabetes mellitus with hyperlipidemia   Essential hypertension, malignant   Atrial fibrillation   PAD (peripheral artery disease)   Vascular dementia with delirium   Macrocytic anemia   CKD (chronic kidney disease) stage 3, GFR 30-59 ml/min   Acute on chronic respiratory failure with hypoxia     Pieter Partridge, [facility]  If 7PM-7AM, please contact night-coverage [email or web address]   LOS: 1 day

## 2022-08-04 NOTE — Consult Note (Signed)
NAME:  [name], MRN:  [id number], DOB:  Dec 18, 1932, LOS: 1 ADMISSION DATE:  08/03/2022, CONSULTATION DATE:  08/04/2022 REFERRING MD:  Leandro Reasoner Tubl*, CHIEF COMPLAINT:  respiratory failure  History of Present Illness:  [name] is a 89 year old woman who presents from home with 2 to 3 days of progressive dyspnea and lower extremity edema.  She has a past medical history of atrial fibrillation on Eliquis, chronic heart failure preserved ejection fraction, diabetes and peripheral arterial disease.  She is intermittently on 1 L/min oxygen at home.  She was brought in by her daughter for worsening shortness of breath and found to be hypoxemic in the ED.  She is admitted to the hospital for CHF exacerbation.  Over the course of today she gradually went from 4 L/min to 15 L/min.  I was contacted by hospitalist team to evaluate for respiratory distress.  She has received Lasix 40 mg today, potassium, nitroglycerin paste.  Patient says she actually feels okay and does not feel short of breath.  She denies chest pain although maybe had some earlier today.  Pertinent  Medical History  Atrial fibrillation on Eliquis Peripheral arterial disease History of CVA Diabetes  Significant Hospital Events: Including procedures, antibiotic start and stop dates in addition to other pertinent events     Interim History / Subjective:    Objective   Blood pressure (!) 163/75, pulse 76, temperature (!) 97.5 F (36.4 C), temperature source Oral, resp. rate 20, height 5' (1.524 m), weight 39.9 kg, SpO2 94 %.        Intake/Output Summary (Last 24 hours) at 08/04/2022 1727 Last data filed at 08/04/2022 1551 Gross per 24 hour  Intake 600 ml  Output 1300 ml  Net -700 ml   Filed Weights   08/03/22 2147 08/04/22 0500  Weight: 39.9 kg 39.9 kg    Examination: General: Elderly, frail, on nasal cannula, not in respiratory distress HENT: On nasal cannula, mucous membranes moist Lungs:  Bibasilar crackles, no wheeze Cardiovascular: Regular rate and rhythm, no murmurs or gallops Abdomen: Soft nontender Extremities: Thin, trace pedal edema, red nail polish Neuro: Normal speech no focal asymmetry GU: External female catheter in place  Chest x-ray from today reviewed, shows pulmonary vascular congestion with bilateral pleural effusions Her BNP was elevated on admission Potassium was 2.8 and up to 4.0 with replacement.  Creatinine went from 1.13-1.56 Hemoglobin 8.3 VBG from today shows she is not hypercapnic  Resolved Hospital Problem list     Assessment & Plan:   Acute hypoxemic respiratory failure Acute on chronic decompensated HFpEF Paroxysmal atrial fibrillation, currently in normal sinus  Agree with plans for repeat Lasix dose this evening at 8 PM.  She has already received potassium replacement.  Can try some nocturnal BiPAP.  Hopefully this is something quickly reversible.  Advised the nurse to get another pulse oximeter just in case the right nail polish is interfering with pulse ox readings.  She is remarkably well-appearing for being on 15 L.  Please reengage CCM if there is a clinical change in the patient.  Durel Salts, MD Pulmonary and Critical Care Medicine [facility] 08/04/2022 5:38 PM Pager: see AMION  If no response to pager, please call critical care on call (see AMION) until 7pm After 7:00 pm call Elink      Labs   CBC: Recent Labs  Lab 08/03/22 1714 08/04/22 0344  WBC 6.9 9.3  NEUTROABS 6.0  --   HGB 8.3* 8.3*  HCT  25.7* 25.2*  MCV 106.6* 106.3*  PLT 292 285    Basic Metabolic Panel: Recent Labs  Lab 08/03/22 1714 08/03/22 2237 08/04/22 0344 08/04/22 1621  NA 137 138 142 139  K 3.4* 3.6 2.8* 4.0  CL 104 99 107 102  CO2 16* GLUCOSE 309* 358* 47* 181*  BUN 26* 29* 25* 31*  CREATININE 1.34* 1.32* 1.13* 1.56*  CALCIUM 9.6 9.6 9.3 9.1  MG  --  1.8 2.0  --    GFR: Estimated Creatinine Clearance: 16 mL/min  (A) (by C-G formula based on SCr of 1.56 mg/dL (H)). Recent Labs  Lab 08/03/22 1714 08/04/22 0344  WBC 6.9 9.3    Liver Function Tests: Recent Labs  Lab 08/03/22 1714  AST 92*  ALT 87*  ALKPHOS 131*  BILITOT 0.9  PROT 7.4  ALBUMIN 3.8   No results for input(s): "LIPASE", "AMYLASE" in the last 168 hours. No results for input(s): "AMMONIA" in the last 168 hours.  ABG    Component Value Date/Time   HCO3 22.4 08/03/2022 1720   TCO2 30 07/11/2021 0716   ACIDBASEDEF 1.1 08/03/2022 1720   O2SAT 36.8 08/03/2022 1720     Coagulation Profile: No results for input(s): "INR", "PROTIME" in the last 168 hours.  Cardiac Enzymes: No results for input(s): "CKTOTAL", "CKMB", "CKMBINDEX", "TROPONINI" in the last 168 hours.  HbA1C: Hgb A1c MFr Bld  Date/Time Value Ref Range Status  06/13/2022 04:48 AM 7.6 (H) 4.8 - 5.6 % Final    Comment:    (NOTE)         Prediabetes: 5.7 - 6.4         Diabetes: >6.4         Glycemic control for adults with diabetes: <7.0   07/24/2021 04:00 PM 7.3 (H) 4.8 - 5.6 % Final    Comment:    (NOTE) Pre diabetes:          5.7%-6.4%  Diabetes:              >6.4%  Glycemic control for   <7.0% adults with diabetes     CBG: Recent Labs  Lab 08/03/22 2141 08/04/22 0736 08/04/22 0806 08/04/22 1135 08/04/22 1623  GLUCAP 312* 77 149* 316* 182*    Review of Systems:    +Shortness of breath  Past Medical History:  She,  has a past medical history of Arthritis, Atrial fibrillation, Diabetes mellitus, DVT (deep venous thrombosis), Hyperlipidemia, Hypertension, Leg pain, Peripheral arterial disease, and Stroke (1999).   Surgical History:   Past Surgical History:  Procedure Laterality Date   ABDOMINAL AORTOGRAM W/LOWER EXTREMITY N/A 07/11/2021   Procedure: ABDOMINAL AORTOGRAM W/LOWER EXTREMITY;  Surgeon: Victorino Sparrow, MD;  Location: [facility] INVASIVE CV LAB;  Service: Cardiovascular;  Laterality: N/A;   BACK SURGERY  1996   CARDIOVERSION N/A  02/15/2020   Procedure: CARDIOVERSION;  Surgeon: Elease Hashimoto, Deloris Ping, MD;  Location: [facility] ENDOSCOPY;  Service: Cardiovascular;  Laterality: N/A;   CARDIOVERSION N/A 08/07/2021   Procedure: CARDIOVERSION;  Surgeon: Maisie Fus, MD;  Location: [facility] ENDOSCOPY;  Service: Cardiovascular;  Laterality: N/A;   CATARACT EXTRACTION  2000   CESAREAN SECTION     ENDARTERECTOMY FEMORAL Left 07/26/2021   Procedure: LEFT COMMON FEMORAL ENDARTERECTOMY;  Surgeon: Victorino Sparrow, MD;  Location: [facility] OR;  Service: Vascular;  Laterality: Left;   EYE SURGERY     FEMORAL BYPASS Left July 23, 2010   LUMBAR DISC SURGERY     [facility]  ANGIOPLASTY Left 07/26/2021   Procedure: PATCH ANGIOPLASTY WITH XENOSURE BIOLOGIC 1 cm x 6 cm PATCH;  Surgeon: Victorino Sparrow, MD;  Location: [facility] OR;  Service: Vascular;  Laterality: Left;   SPINE SURGERY     TEE WITHOUT CARDIOVERSION N/A 08/07/2021   Procedure: TRANSESOPHAGEAL ECHOCARDIOGRAM (TEE);  Surgeon: Maisie Fus, MD;  Location: [facility] ENDOSCOPY;  Service: Cardiovascular;  Laterality: N/A;     Social History:   reports that she has never smoked. She has never used smokeless tobacco. She reports that she does not drink alcohol and does not use drugs.   Family History:  Her family history includes Alzheimer's disease in her sister and sister; Dementia in her sister; Diabetes in her brother, brother, father, sister, and sister; Heart attack in her brother, mother, and sister; Heart disease in her brother and sister; Hypertension in her father and mother; Stroke in her father and sister.   Allergies No Known Allergies   Home Medications  Prior to Admission medications   Medication Sig Start Date End Date Taking? Authorizing Provider  acetaminophen (TYLENOL) 325 MG tablet Take 650 mg by mouth every 6 (six) hours as needed for mild pain or headache.   Yes [provider]  amiodarone (PACERONE) 200 MG tablet Take 1 tablet (200 mg total) by mouth daily. 06/16/22 08/03/22 Yes Berton Mount I, MD  amLODipine (NORVASC) 10 MG tablet Take 1 tablet (10 mg total) by mouth daily. 06/26/22  Yes Lewayne Bunting, MD  atorvastatin (LIPITOR) 40 MG tablet Take 1 tablet (40 mg total) by mouth daily. Patient taking differently: Take 40 mg by mouth at bedtime. 03/22/20 08/03/22 Yes Cleaver, Thomasene Ripple, NP  diclofenac Sodium (VOLTAREN) 1 % GEL Apply 2 g topically 2 (two) times daily as needed (for pain- affected areas).   Yes [provider]  docusate sodium (COLACE) 100 MG capsule Take 100 mg by mouth 2 (two) times daily.   Yes [provider]  ELIQUIS 2.5 MG TABS tablet TAKE 1 TABLET BY MOUTH TWICE A DAY Patient taking differently: Take 2.5 mg by mouth 2 (two) times daily. 02/11/22  Yes Crenshaw, Madolyn Frieze, MD  ENTRESTO 24-26 MG Take 1 tablet by mouth 2 (two) times daily. 06/08/22  Yes [provider]  ezetimibe (ZETIA) 10 MG tablet Take 1 tablet (10 mg total) by mouth daily. Patient taking differently: Take 10 mg by mouth daily with lunch. 05/13/22  Yes Crenshaw, Madolyn Frieze, MD  furosemide (LASIX) 20 MG tablet Take 1 tablet (20 mg total) by mouth daily. 06/16/22  Yes Barnetta Chapel, MD  GINKGO BILOBA PO Take 2 tablets by mouth daily with lunch.   Yes [provider]  glipiZIDE (GLUCOTROL) 10 MG tablet Take 10 mg by mouth See admin instructions. Take 10 mg by mouth before breakfast and lunch 11/13/21  Yes [provider]  LANTUS SOLOSTAR 100 UNIT/ML Solostar Pen Inject 10 Units into the skin at bedtime. 06/15/22  Yes Berton Mount I, MD  latanoprost (XALATAN) 0.005 % ophthalmic solution Place 1 drop into both eyes at bedtime.   Yes [provider]  metFORMIN (GLUCOPHAGE) 500 MG tablet Take 1 tablet (500 mg total) by mouth 2 (two) times daily. 06/15/22 08/14/22 Yes Berton Mount I, MD  metoprolol tartrate (LOPRESSOR) 50 MG tablet TAKE 1 TABLET BY MOUTH TWICE A DAY Patient taking differently: Take 50 mg by mouth See admin instructions. Take 50  mg by mouth in the morning and evening 07/18/22  Yes Crenshaw,  Madolyn Frieze, MD  Multiple Vitamins-Minerals (CENTRUM SILVER ULTRA WOMENS) TABS Take 1 tablet by mouth at bedtime.   Yes [provider]  Omega-3 Fatty Acids (FISH OIL) 1200 MG CAPS Take 1,200 mg by mouth daily with lunch.   Yes [provider]  ondansetron (ZOFRAN) 4 MG tablet Take 4 mg by mouth every 8 (eight) hours as needed for nausea or vomiting.   Yes [provider]  pantoprazole (PROTONIX) 40 MG tablet Take 40 mg by mouth daily before breakfast. 05/07/22  Yes [provider]  ACCU-CHEK SOFTCLIX LANCETS lancets  01/16/18   [provider]  docusate (COLACE) 50 MG/5ML liquid Take 20 mLs (200 mg total) by mouth 2 (two) times daily. Patient not taking: Reported on 08/03/2022 06/15/22   Berton Mount I, MD  iron polysaccharides (NIFEREX) 150 MG capsule Take 150 mg by mouth daily.    07/04/11  [provider]  topiramate (TOPAMAX) 15 MG capsule Take 15 mg by mouth 2 (two) times daily.    07/04/11  [provider]

## 2022-08-05 DIAGNOSIS — J9601 Acute respiratory failure with hypoxia: Secondary | ICD-10-CM | POA: Diagnosis not present

## 2022-08-05 DIAGNOSIS — J90 Pleural effusion, not elsewhere classified: Secondary | ICD-10-CM | POA: Diagnosis not present

## 2022-08-05 DIAGNOSIS — I5033 Acute on chronic diastolic (congestive) heart failure: Secondary | ICD-10-CM | POA: Diagnosis not present

## 2022-08-05 DIAGNOSIS — I480 Paroxysmal atrial fibrillation: Secondary | ICD-10-CM | POA: Diagnosis not present

## 2022-08-05 LAB — BASIC METABOLIC PANEL
Anion gap: 11 (ref 5–15)
BUN: 28 mg/dL — ABNORMAL HIGH (ref 8–23)
CO2: 27 mmol/L (ref 22–32)
Calcium: 9.4 mg/dL (ref 8.9–10.3)
Chloride: 102 mmol/L (ref 98–111)
Creatinine, Ser: 1.3 mg/dL — ABNORMAL HIGH (ref 0.44–1.00)
GFR, Estimated: 40 mL/min — ABNORMAL LOW (ref >60)
Glucose, Bld: 230 mg/dL — ABNORMAL HIGH (ref 70–99)
Potassium: 3.9 mmol/L (ref 3.5–5.1)
Sodium: 140 mmol/L (ref 135–145)

## 2022-08-05 LAB — CBC
HCT: 30.3 % — ABNORMAL LOW (ref 36.0–46.0)
Hemoglobin: 9.6 g/dL — ABNORMAL LOW (ref 12.0–15.0)
MCH: 34.3 pg — ABNORMAL HIGH (ref 26.0–34.0)
MCHC: 31.7 g/dL (ref 30.0–36.0)
MCV: 108.2 fL — ABNORMAL HIGH (ref 80.0–100.0)
Platelets: 300 10*3/uL (ref 150–400)
RBC: 2.8 MIL/uL — ABNORMAL LOW (ref 3.87–5.11)
RDW: 15.5 % (ref 11.5–15.5)
WBC: 8 10*3/uL (ref 4.0–10.5)
nRBC: 0 % (ref 0.0–0.2)

## 2022-08-05 LAB — GLUCOSE, CAPILLARY
Glucose-Capillary: 218 mg/dL — ABNORMAL HIGH (ref 70–99)
Glucose-Capillary: 239 mg/dL — ABNORMAL HIGH (ref 70–99)
Glucose-Capillary: 242 mg/dL — ABNORMAL HIGH (ref 70–99)
Glucose-Capillary: 446 mg/dL — ABNORMAL HIGH (ref 70–99)

## 2022-08-05 MED ORDER — INSULIN GLARGINE-YFGN 100 UNIT/ML ~~LOC~~ SOLN
10.0000 [IU] | Freq: Every day | SUBCUTANEOUS | Status: DC
  Administered 2022-08-05: 10 [IU] via SUBCUTANEOUS
  Filled 2022-08-05 (×2): qty 0.1

## 2022-08-05 MED ORDER — INSULIN ASPART 100 UNIT/ML IJ SOLN
5.0000 [IU] | Freq: Once | INTRAMUSCULAR | Status: AC
  Administered 2022-08-05: 5 [IU] via SUBCUTANEOUS

## 2022-08-05 MED ORDER — POTASSIUM CHLORIDE 20 MEQ PO PACK
20.0000 meq | PACK | Freq: Every day | ORAL | Status: DC
  Administered 2022-08-06 – 2022-08-07 (×2): 20 meq via ORAL
  Filled 2022-08-05 (×2): qty 1

## 2022-08-05 MED ORDER — CARVEDILOL 12.5 MG PO TABS
12.5000 mg | ORAL_TABLET | Freq: Two times a day (BID) | ORAL | Status: DC
  Administered 2022-08-05: 12.5 mg via ORAL
  Filled 2022-08-05 (×2): qty 1

## 2022-08-05 NOTE — Evaluation (Signed)
Physical Therapy Evaluation Patient Details Name: [name] MRN: [id number] DOB: 1932/12/18 Today's Date: 08/05/2022  History of Present Illness  89 yo female admitted with dyspnea. Dx: acute resp failure, bil pleural effusions. Hx of CVA, PVD, CHF, DVT, cardioversion, Afib, anemia, cognitive deficits  Clinical Impression  On eval, pt required Min A for mobility. She walked ~50 feet with a RW. O2 99% on 5L at rest, 89% on 4L with ambulation-made RN aware. Pt is weak and fatigues fairly easily with activity. No family present during session. Will continue to progress activity as tolerated. Pt will benefit from post acute rehab after this inpatient hospital stay.        Recommendations for follow up therapy are one component of a multi-disciplinary discharge planning process, led by the attending physician.  Recommendations may be updated based on patient status, additional functional criteria and insurance authorization.  Follow Up Recommendations Can patient physically be transported by private vehicle: Yes     Assistance Recommended at Discharge Frequent or constant Supervision/Assistance  Patient can return home with the following  Assist for transportation;Assistance with cooking/housework;Help with stairs or ramp for entrance;A little help with walking and/or transfers;A little help with bathing/dressing/bathroom    Equipment Recommendations None recommended by PT  Recommendations for Other Services       Functional Status Assessment Patient has had a recent decline in their functional status and demonstrates the ability to make significant improvements in function in a reasonable and predictable amount of time.     Precautions / Restrictions Precautions Precautions: Fall Precaution Comments: monitor O2 (was wearing 1L O2 at baseline) Restrictions Weight Bearing Restrictions: No      Mobility  Bed Mobility               General bed mobility comments: oob in  recliner    Transfers Overall transfer level: Needs assistance Equipment used: Rolling walker (2 wheels) Transfers: Sit to/from Stand Sit to Stand: Min assist           General transfer comment: Assist to rise, steady, control descent. Cues for safety, hand placement.    Ambulation/Gait Ambulation/Gait assistance: Min assist Gait Distance (Feet): 50 Feet Assistive device: Rolling walker (2 wheels) Gait Pattern/deviations: Step-through pattern, Decreased stride length, Trunk flexed       General Gait Details: Assist to stabilize pt and manage RW throughout distance. Pt tends to "hug" L side of walker while ambulating. O2 89% on 4L. Dyspnea 2/4. Pt fatigues fairly quickly-was tired by end of distance. Fall risk.  Stairs            Wheelchair Mobility    Modified Rankin (Stroke Patients Only)       Balance Overall balance assessment: Needs assistance         Standing balance support: During functional activity, Reliant on assistive device for balance, Bilateral upper extremity supported Standing balance-Leahy Scale: Poor                               Pertinent Vitals/Pain Pain Assessment Pain Assessment: No/denies pain    Home Living Family/patient expects to be discharged to:: Skilled nursing facility Living Arrangements: Alone Available Help at Discharge: Family;Available PRN/intermittently Type of Home: House Home Access: Stairs to enter Entrance Stairs-Rails: Right Entrance Stairs-Number of Steps: 4   Home Layout: One level Home Equipment: BSC/3in1;Rolling Walker (2 wheels);Cane - quad;Rollator (4 wheels);Shower seat;Grab bars - tub/shower Additional Comments: daughter comes  daily to assist    Prior Function Prior Level of Function : Needs assist;Patient poor historian/Family not available             Mobility Comments: pt unable to recall dme that she used at home ADLs Comments: daughter comes daily to assist as needed, pt does  not drive     Hand Dominance   Dominant Hand: Right    Extremity/Trunk Assessment   Upper Extremity Assessment Upper Extremity Assessment: Generalized weakness    Lower Extremity Assessment Lower Extremity Assessment: Generalized weakness    Cervical / Trunk Assessment Cervical / Trunk Assessment: Kyphotic  Communication   Communication: No difficulties  Cognition Arousal/Alertness: Awake/alert Behavior During Therapy: WFL for tasks assessed/performed Overall Cognitive Status: History of cognitive impairments - at baseline                                          General Comments      Exercises     Assessment/Plan    PT Assessment Patient needs continued PT services  PT Problem List Decreased strength;Decreased balance;Decreased cognition;Decreased mobility;Decreased activity tolerance;Decreased knowledge of use of DME       PT Treatment Interventions DME instruction;Functional mobility training;Balance training;Patient/family education;Therapeutic activities;Gait training;Therapeutic exercise    PT Goals (Current goals can be found in the Care Plan section)  Acute Rehab PT Goals Patient Stated Goal: none stated PT Goal Formulation: Patient unable to participate in goal setting Time For Goal Achievement: 08/19/22 Potential to Achieve Goals: Good    Frequency Min 1X/week     Co-evaluation               AM-PAC PT "6 Clicks" Mobility  Outcome Measure Help needed turning from your back to your side while in a flat bed without using bedrails?: A Little Help needed moving from lying on your back to sitting on the side of a flat bed without using bedrails?: A Little Help needed moving to and from a bed to a chair (including a wheelchair)?: A Little Help needed standing up from a chair using your arms (e.g., wheelchair or bedside chair)?: A Little Help needed to walk in hospital room?: A Little Help needed climbing 3-5 steps with a railing?  : A Lot 6 Click Score: 17    End of Session Equipment Utilized During Treatment: Gait belt;Oxygen Activity Tolerance: Patient tolerated treatment well;Patient limited by fatigue Patient left: in chair;with call bell/phone within reach   PT Visit Diagnosis: Muscle weakness (generalized) (M62.81);Difficulty in walking, not elsewhere classified (R26.2)    Time: 8119-1478 PT Time Calculation (min) (ACUTE ONLY): 20 min   Charges:   PT Evaluation $PT Eval Low Complexity: 1 Low           Faye Ramsay, PT Acute Rehabilitation  Office: [phone]

## 2022-08-05 NOTE — TOC Progression Note (Signed)
Transition of Care [facility]) - Progression Note    Patient Details  Name: [name] MRN: [id number] Date of Birth: 1932-08-07  Transition of Care [facility]) CM/SW Contact  Howell Rucks, RN Phone Number: 08/05/2022, 12:24 PM  Clinical Narrative:  Discharge SNF recommendation received. PT order placed today. Await PT recommendations.           Expected Discharge Plan and Services                                               Social Determinants of Health (SDOH) Interventions SDOH Screenings   Food Insecurity: Patient Declined (08/04/2022)  Housing: Low Risk  (08/04/2022)  Transportation Needs: Patient Declined (08/04/2022)  Utilities: Patient Declined (08/04/2022)  Alcohol Screen: Low Risk  (06/13/2021)  Depression (PHQ2-9): Low Risk  (06/13/2021)  Financial Resource Strain: Low Risk  (01/31/2020)  Physical Activity: Sufficiently Active (01/31/2020)  Stress: No Stress Concern Present (08/30/2019)  Tobacco Use: Low Risk  (08/03/2022)    Readmission Risk Interventions     No data to display

## 2022-08-05 NOTE — Progress Notes (Addendum)
PROGRESS NOTE    [name]  ZOX:[id number]  DOB: [birth year]  DOA: 08/03/2022 PCP: [name], MD Outpatient Specialists:   Hospital course:   89 y.o. female with medical history significant for hypertension, hyperlipidemia, history of CVA, atrial fibrillation on Eliquis, chronic HFpEF, insulin-dependent diabetes mellitus, and PAD was admitted for acute hypoxic respiratory failure secondary to decompensated HFpEF.  Workup in ED revealed BNP 1269 and chest x-ray with bilateral pleural effusions.  Subjective:  Patient seen just after being bathed. She denies SOB, states she feels she has to urinate "but it won't come out". States she feels pretty good.    Objective: Vitals:   08/05/22 0500 08/05/22 0610 08/05/22 0807 08/05/22 0902  BP:  (!) 165/79    Pulse:      Resp:  18    Temp:  98.1 F (36.7 C)    TempSrc:  Oral    SpO2:  98% 100% 97%  Weight: 38.3 kg     Height:        Intake/Output Summary (Last 24 hours) at 08/05/2022 1010 Last data filed at 08/05/2022 0830 Gross per 24 hour  Intake 1320 ml  Output 2500 ml  Net -1180 ml    Filed Weights   08/03/22 2147 08/04/22 0500 08/05/22 0500  Weight: 39.9 kg 39.9 kg 38.3 kg     Exam:  General: Thin patient lying flat in bed without tachypnea  Eyes: sclera anicteric, conjuctiva mild injection bilaterally CVS: S1-S2, regular  Respiratory:  decreased air entry bilaterally, CTA   GI: NABS, soft, NT  LE: Warm and well-perfused, trace edema   Data Reviewed:  Basic Metabolic Panel: Recent Labs  Lab 08/03/22 1714 08/03/22 2237 08/04/22 0344 08/04/22 1621 08/05/22 0357  NA 137 138 142 139 140  K 3.4* 3.6 2.8* 4.0 3.9  CL 104 99 107 102 102  CO2 16* GLUCOSE 309* 358* 47* 181* 230*  BUN 26* 29* 25* 31* 28*  CREATININE 1.34* 1.32* 1.13* 1.56* 1.30*  CALCIUM 9.6 9.6 9.3 9.1 9.4  MG  --  1.8 2.0  --   --      CBC: Recent Labs  Lab 08/03/22 1714 08/04/22 0344 08/05/22 0357  WBC 6.9  9.3 8.0  NEUTROABS 6.0  --   --   HGB 8.3* 8.3* 9.6*  HCT 25.7* 25.2* 30.3*  MCV 106.6* 106.3* 108.2*  PLT 292 285 300      Scheduled Meds:  amiodarone  200 mg Oral Daily   amLODipine  10 mg Oral Daily   apixaban  2.5 mg Oral BID   atorvastatin  40 mg Oral QHS   furosemide  40 mg Intravenous Q12H   insulin aspart  0-5 Units Subcutaneous QHS   insulin aspart  0-6 Units Subcutaneous TID WC   insulin glargine-yfgn  8 Units Subcutaneous QHS   latanoprost  1 drop Both Eyes QHS   metoprolol tartrate  50 mg Oral BID   pantoprazole  40 mg Oral QAC breakfast   sacubitril-valsartan  1 tablet Oral BID   sodium chloride flush  3 mL Intravenous Q12H   Continuous Infusions:     Assessment & Plan:   Acute hypoxic respiratory failure Decompensated HFpEF Patient had marked increase in oxygen requirement yesterday from 5L to 15 liters Very much appreciate PCCM evaluation--in particular suggestion to check o2 sats on finger that does not have a dark nail polish as patient appeared very comfortable. She has diuresed well yesterday on  Lasix 40 iv bid O2 sats are better with pulse ox on ear, O2 to be titrated down as tolerated to sat 92%--O2 has been titrated down to 4 L although she desaturated with ambulation. Cardiology consult is pending. Follow electrolytes, strict I's and O's Consider thoracentesis if not improved with diuresis alone  Continue amlodipine, metoprolol and Entresto per home doses  Hypokalemia Resolved with repletion  Maintenance Kcl 20 meq daily added while patient being diuresed   Atrial fibrillation Rate is well-controlled on metoprolol Continue Eliquis for stroke prevention  DM 2 with hyperglycemia  Blood sugars labile, ranging from 300s to 47 last night Discontinue PM SSI coverage PM snack  Increase glargine to 10 units   HTN Normalized with diuresis and reinstitution of BP meds  CKD 3A Creatinine is at baseline     DVT prophylaxis: Eliquis Code  Status: Full Family Communication: None today Disposition Plan:   Patient is from: Home  Anticipated Discharge Location: Home     Studies: DG CHEST PORT 1 VIEW  Result Date: 08/04/2022 CLINICAL DATA:  Dyspnea EXAM: PORTABLE CHEST 1 VIEW COMPARISON:  X-ray 08/03/2022 and older FINDINGS: Persistent moderate pleural effusions and adjacent opacities. No pneumothorax. Vascular congestion within enlarged heart. Calcified aorta. Calcifications of the tracheobronchial tree. Overlapping cardiac leads. IMPRESSION: No significant interval change when adjusting for technique Electronically Signed   By: Karen Kays M.D.   On: 08/04/2022 16:19   DG Chest Portable 1 View  Result Date: 08/03/2022 CLINICAL DATA:  Shortness of breath, hypoxia EXAM: PORTABLE CHEST 1 VIEW COMPARISON:  06/13/2022 FINDINGS: Heart and mediastinal contours within normal limits, stable. Moderate layering bilateral effusions with bilateral airspace disease, predominately perihilar and lower lobe. Aortic atherosclerosis. No acute bony abnormality. IMPRESSION: Moderate bilateral layering effusions with bilateral perihilar and lower lobe airspace opacities. Findings could reflect edema or infection. Findings similar to prior study. Electronically Signed   By: Charlett Nose M.D.   On: 08/03/2022 17:31    Principal Problem:   Acute on chronic diastolic CHF (congestive heart failure) Active Problems:   Type 2 diabetes mellitus with hyperlipidemia   Essential hypertension, malignant   Atrial fibrillation   PAD (peripheral artery disease)   Vascular dementia with delirium   Macrocytic anemia   CKD (chronic kidney disease) stage 3, GFR 30-59 ml/min   Acute on chronic respiratory failure with hypoxia     Pieter Partridge, [facility]  If 7PM-7AM, please contact night-coverage [email or web address]   LOS: 2 days

## 2022-08-05 NOTE — Consult Note (Signed)
Cardiology Consultation   Patient ID: [name] MRN: [id number]; DOB: 03-May-1932  Admit date: 08/03/2022 Date of Consult: 08/05/2022  PCP:  Pam Rakers, MD   [facility] Providers Cardiologist:  Pam Millers, MD        Patient Profile:   [name] is a 89 y.o. female with a hx of paroxysmal atrial fibrillation, atrial flutter, prior CVA, diastolic CHF, hypertension, coronary artery calcifications, pleural effusions, mild MR, moderate TR, PAD, hyperlipidemia, DM who is being seen 08/05/2022 for the evaluation of acute heart failure at the request of Dr. Luberta Hale.  History of Present Illness:   [name] presented to the ED on 4/20 for evaluation of worsening dyspnea. Patient stated that over a 2-3 day period she developed abnormal exertional dypsnea that progressed to occurring at rest as well. Patient apparently missed a few days of diuretics last week after running out and not having refills on hand. Initial evaluation in the ED noted hypoxia with O2 saturation 78%. CXR with moderate bilateral pleural effusions. Labs notable for BNP 1269.5, creatinine 1.34, troponin 17->19, HGB 8.3. Patient initiated on IV diuresis, Lasix  BID. Amlodipine, Metoprolol, Entresto all continued. During the day yesterday, patient with notable increase in O2 requirements 5 to 15L/min, prompting PCCM consult. Per notes, clinical presentation didn't match O2 saturation and it was felt that dark nail polish was possibly contributing to falsely low O2 readings.   Of note, patient recently discharged following a hospitalization for diastolic CHF/hypertensive urgency. Echocardiogram during that admission noted normal LV function, mild mitral regurgitation, moderate tricuspid regurgitation. Patient saw Dr. Jens Hale in follow up on 3/13 and overall she was doing quite well. She was still hypertensive at OV and Amlodipine was increased to . Otherwise, she was continued on Lopressor   BID, Entresto 24-26mg  BID, Lasix  daily.   This morning, patient is a little unclear on details leading to admission. She tells me that her daughter noticed her breathing was abnormal and that this prompted admission. Patient herself says that she didn't really feel short of breath. No chest pain or palpitations today. Breathing feels stable per patient.  Past Medical History:  Diagnosis Date   Arthritis    Atrial fibrillation    Diabetes mellitus    DVT (deep venous thrombosis)    Hyperlipidemia    Hypertension    Leg pain    Peripheral arterial disease    Stroke 1999    Past Surgical History:  Procedure Laterality Date   ABDOMINAL AORTOGRAM W/LOWER EXTREMITY N/A 07/11/2021   Procedure: ABDOMINAL AORTOGRAM W/LOWER EXTREMITY;  Surgeon: Pam Sparrow, MD;  Location: [facility] INVASIVE CV LAB;  Service: Cardiovascular;  Laterality: N/A;   BACK SURGERY  1996   CARDIOVERSION N/A 02/15/2020   Procedure: CARDIOVERSION;  Surgeon: [name] Ping, MD;  Location[facility] ENDOSCOPY;  Service: Cardiovascular;  Laterality: N/A;   CARDIOVERSION N/A 08/07/2021   Procedure: CARDIOVERSION;  Surgeon: Pam Fus, MD;  Location: [facility] ENDOSCOPY;  Service: Cardiovascular;  Laterality: N/A;   CATARACT EXTRACTION  2000   CESAREAN SECTION     ENDARTERECTOMY FEMORAL Left 07/26/2021   Procedure: LEFT COMMON FEMORAL ENDARTERECTOMY;  Surgeon: Pam Sparrow, MD;  Location: [facility] OR;  Service: Vascular;  Laterality: Left;   EYE SURGERY     FEMORAL BYPASS Left July 23, 2010   LUMBAR DISC SURGERY     PATCH ANGIOPLASTY Left 07/26/2021   Procedure: PATCH ANGIOPLASTY WITH XENOSURE BIOLOGIC 1 cm x 6 cm PATCH;  Surgeon: Pam Sparrow, MD;  Location: [facility] OR;  Service: Vascular;  Laterality: Left;   SPINE SURGERY     TEE WITHOUT CARDIOVERSION N/A 08/07/2021   Procedure: TRANSESOPHAGEAL ECHOCARDIOGRAM (TEE);  Surgeon: Pam Fus, MD;  Location: [facility] ENDOSCOPY;  Service: Cardiovascular;  Laterality: N/A;     Home  Medications:  Prior to Admission medications   Medication Sig Start Date End Date Taking? Authorizing Provider  acetaminophen (TYLENOL) 325 MG tablet Take 650 mg by mouth every 6 (six) hours as needed for mild pain or headache.   Yes [provider]  amiodarone (PACERONE) 200 MG tablet Take 1 tablet (200 mg total) by mouth daily. 06/16/22 08/03/22 Yes [name] I, MD  amLODipine (NORVASC) 10 MG tablet Take 1 tablet (10 mg total) by mouth daily. 06/26/22  Yes Pam Bunting, MD  atorvastatin (LIPITOR) 40 MG tablet Take 1 tablet (40 mg total) by mouth daily. Patient taking differently: Take 40 mg by mouth at bedtime. 03/22/20 08/03/22 Yes [name], Pam Ripple, NP  diclofenac Sodium (VOLTAREN) 1 % GEL Apply 2 g topically 2 (two) times daily as needed (for pain- affected areas).   Yes [provider]  docusate sodium (COLACE) 100 MG capsule Take 100 mg by mouth 2 (two) times daily.   Yes [provider]  ELIQUIS 2.5 MG TABS tablet TAKE 1 TABLET BY MOUTH TWICE A DAY Patient taking differently: Take 2.5 mg by mouth 2 (two) times daily. 02/11/22  Yes [name], Pam Frieze, MD  ENTRESTO 24-26 MG Take 1 tablet by mouth 2 (two) times daily. 06/08/22  Yes [provider]  ezetimibe (ZETIA) 10 MG tablet Take 1 tablet (10 mg total) by mouth daily. Patient taking differently: Take 10 mg by mouth daily with lunch. 05/13/22  Yes [name], Pam Frieze, MD  furosemide (LASIX) 20 MG tablet Take 1 tablet (20 mg total) by mouth daily. 06/16/22  Yes Pam Chapel, MD  GINKGO BILOBA PO Take 2 tablets by mouth daily with lunch.   Yes [provider]  glipiZIDE (GLUCOTROL) 10 MG tablet Take 10 mg by mouth See admin instructions. Take 10 mg by mouth before breakfast and lunch 11/13/21  Yes [provider]  LANTUS SOLOSTAR 100 UNIT/ML Solostar Pen Inject 10 Units into the skin at bedtime. 06/15/22  Yes [name] I, MD  latanoprost (XALATAN) 0.005 % ophthalmic solution  Place 1 drop into both eyes at bedtime.   Yes [provider]  metFORMIN (GLUCOPHAGE) 500 MG tablet Take 1 tablet (500 mg total) by mouth 2 (two) times daily. 06/15/22 08/14/22 Yes [name] I, MD  metoprolol tartrate (LOPRESSOR) 50 MG tablet TAKE 1 TABLET BY MOUTH TWICE A DAY Patient taking differently: Take 50 mg by mouth See admin instructions. Take 50 mg by mouth in the morning and evening 07/18/22  Yes [name], Pam Frieze, MD  Multiple Vitamins-Minerals (CENTRUM SILVER ULTRA WOMENS) TABS Take 1 tablet by mouth at bedtime.   Yes [provider]  Omega-3 Fatty Acids (FISH OIL) 1200 MG CAPS Take 1,200 mg by mouth daily with lunch.   Yes [provider]  ondansetron (ZOFRAN) 4 MG tablet Take 4 mg by mouth every 8 (eight) hours as needed for nausea or vomiting.   Yes [provider]  pantoprazole (PROTONIX) 40 MG tablet Take 40 mg by mouth daily before breakfast. 05/07/22  Yes [provider]  ACCU-CHEK SOFTCLIX LANCETS lancets  01/16/18   [provider]  docusate (COLACE) 50 MG/5ML  liquid Take 20 mLs (200 mg total) by mouth 2 (two) times daily. Patient not taking: Reported on 08/03/2022 06/15/22   [name] I, MD  iron polysaccharides (NIFEREX) 150 MG capsule Take 150 mg by mouth daily.    07/04/11  [provider]  topiramate (TOPAMAX) 15 MG capsule Take 15 mg by mouth 2 (two) times daily.    07/04/11  [provider]    Inpatient Medications: Scheduled Meds:  amiodarone  200 mg Oral Daily   amLODipine  10 mg Oral Daily   apixaban  2.5 mg Oral BID   atorvastatin  40 mg Oral QHS   furosemide  40 mg Intravenous Q12H   insulin aspart  0-6 Units Subcutaneous TID WC   insulin glargine-yfgn  10 Units Subcutaneous QHS   latanoprost  1 drop Both Eyes QHS   metoprolol tartrate  50 mg Oral BID   pantoprazole  40 mg Oral QAC breakfast   [START ON 08/06/2022] potassium chloride  20 mEq Oral Daily   sacubitril-valsartan  1  tablet Oral BID   sodium chloride flush  3 mL Intravenous Q12H   Continuous Infusions:  PRN Meds: acetaminophen **OR** acetaminophen, diclofenac Sodium, senna  Allergies:   No Known Allergies  Social History:   Social History   Socioeconomic History   Marital status: Widowed    Spouse name: Not on file   Number of children: 1   Years of education: Not on file   Highest education level: Not on file  Occupational History   Occupation: Retired    Comment: Teacher, adult education  Tobacco Use   Smoking status: Never   Smokeless tobacco: Never  Vaping Use   Vaping Use: Never used  Substance and Sexual Activity   Alcohol use: No   Drug use: No   Sexual activity: Not on file  Other Topics Concern   Not on file  Social History Narrative   Lives alone. Has daily contact with friends. Transportation by friends and sometimes [employer].   Social Determinants of Health   Financial Resource Strain: Low Risk  (01/31/2020)   Overall Financial Resource Strain (CARDIA)    Difficulty of Paying Living Expenses: Not hard at all  Food Insecurity: Patient Declined (08/04/2022)   Hunger Vital Sign    Worried About Running Out of Food in the Last Year: Patient declined    Ran Out of Food in the Last Year: Patient declined  Transportation Needs: Patient Declined (08/04/2022)   PRAPARE - Administrator, Civil Service (Medical): Patient declined    Lack of Transportation (Non-Medical): Patient declined  Physical Activity: Sufficiently Active (01/31/2020)   Exercise Vital Sign    Days of Exercise per Week: 7 days    Minutes of Exercise per Session: 120 min  Stress: No Stress Concern Present (08/30/2019)   [employer] of Occupational Health - Occupational Stress Questionnaire    Feeling of Stress : Not at all  Social Connections: Not on file  Intimate Partner Violence: Patient Declined (08/04/2022)   Humiliation, Afraid, Rape, and Kick questionnaire    Fear  of Current or Ex-Partner: Patient declined    Emotionally Abused: Patient declined    Physically Abused: Patient declined    Sexually Abused: Patient declined    Family History:    Family History  Problem Relation Age of Onset   Hypertension Mother    Heart attack Mother    Diabetes Father    Hypertension Father  Stroke Father    Stroke Sister    Diabetes Sister    Heart disease Sister        Before age 4-Aneurysm   Dementia Sister    Alzheimer's disease Sister    Diabetes Brother    Diabetes Brother    Heart disease Brother        Before age 67   Heart attack Brother    Heart attack Sister    Diabetes Sister    Alzheimer's disease Sister      ROS:  Please see the history of present illness.   All other ROS reviewed and negative.     Physical Exam/Data:   Vitals:   08/05/22 0500 08/05/22 0610 08/05/22 0807 08/05/22 0902  BP:  (!) 165/79    Pulse:      Resp:  18    Temp:  98.1 F (36.7 C)    TempSrc:  Oral    SpO2:  98% 100% 97%  Weight: 38.3 kg     Height:        Intake/Output Summary (Last 24 hours) at 08/05/2022 1119 Last data filed at 08/05/2022 1028 Gross per 24 hour  Intake 960 ml  Output 2850 ml  Net -1890 ml      08/05/2022    5:00 AM 08/04/2022    5:00 AM 08/03/2022    9:47 PM  Last 3 Weights  Weight (lbs) 84 lb 7 oz 87 lb 15.4 oz 87 lb 15.4 oz  Weight (kg) 38.3 kg 39.9 kg 39.9 kg     Body mass index is 16.49 kg/m.  General:  Well nourished, well developed, in no acute distress HEENT: normal Neck: JVP elevated to angle of mandible Vascular: No carotid bruits; Distal pulses 2+ bilaterally Cardiac:  normal S1, S2; RRR; no murmur  Lungs:  bilateral bases are diminished Abd: soft, nontender, no hepatomegaly  Ext: no edema Musculoskeletal:  No deformities, BUE and BLE strength normal and equal Skin: warm and dry  Neuro:  CNs 2-12 intact, no focal abnormalities noted Psych:  Normal affect   EKG:  The EKG was personally reviewed and  demonstrates:  No new tracing Telemetry:  Telemetry was personally reviewed and demonstrates:  sinus rhythm  Relevant CV Studies:  06/14/22 TTE  IMPRESSIONS     1. Left ventricular ejection fraction, by estimation, is 50 to 55%. The  left ventricle has low normal function. The left ventricle has no regional  wall motion abnormalities. Left ventricular diastolic parameters are  indeterminate.   2. Right ventricular systolic function is low normal. The right  ventricular size is normal.   3. The mitral valve is degenerative. Mild mitral valve regurgitation. No  evidence of mitral stenosis.   4. No systolic flow reversal of IVC spectral doppler. Tricuspid valve  regurgitation is moderate.   5. The aortic valve was not well visualized. Aortic valve regurgitation  is not visualized.   Comparison(s): Tricuspid regurgitation has increased.   FINDINGS   Left Ventricle: Left ventricular ejection fraction, by estimation, is 50  to 55%. The left ventricle has low normal function. The left ventricle has  no regional wall motion abnormalities. The left ventricular internal  cavity size was small. There is no  left ventricular hypertrophy. Left ventricular diastolic parameters are  indeterminate.   Right Ventricle: The right ventricular size is normal. No increase in  right ventricular wall thickness. Right ventricular systolic function is  low normal.   Left Atrium: Left atrial size was normal  in size.   Right Atrium: Right atrial size was normal in size.   Pericardium: There is no evidence of pericardial effusion.   Mitral Valve: The mitral valve is degenerative in appearance. Mild mitral  annular calcification. Mild mitral valve regurgitation. No evidence of  mitral valve stenosis.   Tricuspid Valve: No systolic flow reversal of IVC spectral doppler. The  tricuspid valve is grossly normal. Tricuspid valve regurgitation is  moderate.   Aortic Valve: The aortic valve was not  well visualized. Aortic valve  regurgitation is not visualized.   Pulmonic Valve: The pulmonic valve was not well visualized. Pulmonic valve  regurgitation is not visualized. No evidence of pulmonic stenosis.   Aorta: The aortic root and ascending aorta are structurally normal, with  no evidence of dilitation.   IAS/Shunts: No atrial level shunt detected by color flow Doppler.   Laboratory Data:  High Sensitivity Troponin:   Recent Labs  Lab 08/03/22 1714 08/03/22 1915  TROPONINIHS 17 19*     Chemistry Recent Labs  Lab 08/03/22 2237 08/04/22 0344 08/04/22 1621 08/05/22 0357  NA 138 142 139 140  K 3.6 2.8* 4.0 3.9  CL 99 107 102 102  CO2 GLUCOSE 358* 47* 181* 230*  BUN 29* 25* 31* 28*  CREATININE 1.32* 1.13* 1.56* 1.30*  CALCIUM 9.6 9.3 9.1 9.4  MG 1.8 2.0  --   --   GFRNONAA 39* 47* 32* 40*  ANIONGAP Recent Labs  Lab 08/03/22 1714  PROT 7.4  ALBUMIN 3.8  AST 92*  ALT 87*  ALKPHOS 131*  BILITOT 0.9   Lipids No results for input(s): "CHOL", "TRIG", "HDL", "LABVLDL", "LDLCALC", "CHOLHDL" in the last 168 hours.  Hematology Recent Labs  Lab 08/03/22 1714 08/04/22 0344 08/05/22 0357  WBC 6.9 9.3 8.0  RBC 2.41* 2.37*  2.40* 2.80*  HGB 8.3* 8.3* 9.6*  HCT 25.7* 25.2* 30.3*  MCV 106.6* 106.3* 108.2*  MCH 34.4* 35.0* 34.3*  MCHC 32.3 32.9 31.7  RDW 15.5 15.7* 15.5  PLT 292 285 300   Thyroid No results for input(s): "TSH", "FREET4" in the last 168 hours.  BNP Recent Labs  Lab 08/03/22 1714  BNP 1,269.5*    DDimer No results for input(s): "DDIMER" in the last 168 hours.   Radiology/Studies:  DG CHEST PORT 1 VIEW  Result Date: 08/04/2022 CLINICAL DATA:  Dyspnea EXAM: PORTABLE CHEST 1 VIEW COMPARISON:  X-ray 08/03/2022 and older FINDINGS: Persistent moderate pleural effusions and adjacent opacities. No pneumothorax. Vascular congestion within enlarged heart. Calcified aorta. Calcifications of the tracheobronchial tree.  Overlapping cardiac leads. IMPRESSION: No significant interval change when adjusting for technique Electronically Signed   By: Karen Kays M.D.   On: 08/04/2022 16:19   DG Chest Portable 1 View  Result Date: 08/03/2022 CLINICAL DATA:  Shortness of breath, hypoxia EXAM: PORTABLE CHEST 1 VIEW COMPARISON:  06/13/2022 FINDINGS: Heart and mediastinal contours within normal limits, stable. Moderate layering bilateral effusions with bilateral airspace disease, predominately perihilar and lower lobe. Aortic atherosclerosis. No acute bony abnormality. IMPRESSION: Moderate bilateral layering effusions with bilateral perihilar and lower lobe airspace opacities. Findings could reflect edema or infection. Findings similar to prior study. Electronically Signed   By: Charlett Nose M.D.   On: 08/03/2022 17:31     Assessment and Plan:   Acute diastolic congestive heart failure Acute hypoxic respiratory failure  Patient admitted with progressive dyspnea after several days of missed lasix due to  delayed refill. BNP on admission 1269.5. This is patient's second admission in the last 2 months for CHF exacerbation.   Patient does have elevation of JVP on exam though no peripheral edema appreciated.  Patient net negative 2.2L this admission with IV lasix 40mg  BID.  Overall sounds as though patient was doing well prior to missed doses of Lasix. Would continue IV diuresis and continue home HF GDMT. Patient's eGFR borderline for initiation of SGLT2 but would plan to add MRA if patient's renal function stabilizes. CXR with pleural effusions, appear slightly worse this admission over last. Suspect this is primary source of dyspnea. Could consider tapping. Bilateral lower lobes diminished on exam.  Atrial fibrillation Secondary hypercoagulable state CHA2DS2-VASc Score = 7   NSR with controlled rates at this time. Continue Amiodarone and Eliquis.   Hypertension  Patient remains hypertensive this morning despite  receiving Amlodipine 10mg , Entresto 24-26mg  BID, Metoprolol Tartrate 50mg . Patient appears to have previously tolerated Coreg and given ongoing hypertension, would favor switching back to this from metoprolol for better BP control.   Continue Entresto 24-26mg  BID. Would not plan to increase at this time given elevated creatinine. Continue Amlodipine 10mg  Switch to Coreg 12.5mg  BID this evening Consider adding MRA if renal function stabilizes   Per primary team:  DM type II CKD IIIa Anemia  Risk Assessment/Risk Scores:        New York Heart Association (NYHA) Functional Class NYHA Class II  CHA2DS2-VASc Score = 7   This indicates a 11.2% annual risk of stroke. The patient's score is based upon: CHF History: 0 HTN History: 1 Diabetes History: 1 Stroke History: 2 Vascular Disease History: 0 Age Score: 2 Gender Score: 1         For questions or updates, please contact [facility] Please consult [email or web address] for contact info under    Signed, Perlie Gold, PA-C  08/05/2022 11:19 AM

## 2022-08-06 ENCOUNTER — Inpatient Hospital Stay (HOSPITAL_COMMUNITY): Payer: Medicare HMO

## 2022-08-06 DIAGNOSIS — I5033 Acute on chronic diastolic (congestive) heart failure: Secondary | ICD-10-CM | POA: Diagnosis not present

## 2022-08-06 DIAGNOSIS — J90 Pleural effusion, not elsewhere classified: Secondary | ICD-10-CM | POA: Diagnosis not present

## 2022-08-06 DIAGNOSIS — J9621 Acute and chronic respiratory failure with hypoxia: Secondary | ICD-10-CM | POA: Diagnosis not present

## 2022-08-06 LAB — BASIC METABOLIC PANEL
Anion gap: 12 (ref 5–15)
BUN: 44 mg/dL — ABNORMAL HIGH (ref 8–23)
CO2: 24 mmol/L (ref 22–32)
Calcium: 8.7 mg/dL — ABNORMAL LOW (ref 8.9–10.3)
Chloride: 101 mmol/L (ref 98–111)
Creatinine, Ser: 1.54 mg/dL — ABNORMAL HIGH (ref 0.44–1.00)
GFR, Estimated: 32 mL/min — ABNORMAL LOW (ref >60)
Glucose, Bld: 211 mg/dL — ABNORMAL HIGH (ref 70–99)
Potassium: 3.3 mmol/L — ABNORMAL LOW (ref 3.5–5.1)
Sodium: 137 mmol/L (ref 135–145)

## 2022-08-06 LAB — PROTEIN, PLEURAL OR PERITONEAL FLUID: Total protein, fluid: 3.2 g/dL

## 2022-08-06 LAB — MAGNESIUM: Magnesium: 1.9 mg/dL (ref 1.7–2.4)

## 2022-08-06 LAB — GLUCOSE, CAPILLARY
Glucose-Capillary: 171 mg/dL — ABNORMAL HIGH (ref 70–99)
Glucose-Capillary: 550 mg/dL (ref 70–99)
Glucose-Capillary: 244 mg/dL — ABNORMAL HIGH (ref 70–99)
Glucose-Capillary: 322 mg/dL — ABNORMAL HIGH (ref 70–99)

## 2022-08-06 LAB — LACTATE DEHYDROGENASE, PLEURAL OR PERITONEAL FLUID: LD, Fluid: 126 U/L — ABNORMAL HIGH (ref 3–23)

## 2022-08-06 LAB — GLUCOSE, RANDOM: Glucose, Bld: 278 mg/dL — ABNORMAL HIGH (ref 70–99)

## 2022-08-06 LAB — BODY FLUID CULTURE W GRAM STAIN

## 2022-08-06 MED ORDER — INSULIN GLARGINE-YFGN 100 UNIT/ML ~~LOC~~ SOLN
20.0000 [IU] | Freq: Every day | SUBCUTANEOUS | Status: DC
  Administered 2022-08-06: 20 [IU] via SUBCUTANEOUS
  Filled 2022-08-06 (×2): qty 0.2

## 2022-08-06 MED ORDER — POTASSIUM CHLORIDE CRYS ER 20 MEQ PO TBCR
30.0000 meq | EXTENDED_RELEASE_TABLET | ORAL | Status: AC
  Administered 2022-08-06 (×2): 30 meq via ORAL
  Filled 2022-08-06 (×2): qty 1

## 2022-08-06 MED ORDER — INSULIN ASPART 100 UNIT/ML IJ SOLN
0.0000 [IU] | Freq: Every day | INTRAMUSCULAR | Status: DC
  Administered 2022-08-06: 4 [IU] via SUBCUTANEOUS

## 2022-08-06 MED ORDER — INSULIN ASPART 100 UNIT/ML IJ SOLN
20.0000 [IU] | Freq: Once | INTRAMUSCULAR | Status: AC
  Administered 2022-08-06: 20 [IU] via SUBCUTANEOUS

## 2022-08-06 MED ORDER — FUROSEMIDE 10 MG/ML IJ SOLN
40.0000 mg | Freq: Two times a day (BID) | INTRAMUSCULAR | Status: DC
  Administered 2022-08-06: 40 mg via INTRAVENOUS
  Filled 2022-08-06: qty 4

## 2022-08-06 MED ORDER — CARVEDILOL 6.25 MG PO TABS
6.2500 mg | ORAL_TABLET | Freq: Two times a day (BID) | ORAL | Status: DC
  Administered 2022-08-06 – 2022-08-07 (×3): 6.25 mg via ORAL
  Filled 2022-08-06 (×3): qty 1

## 2022-08-06 MED ORDER — INSULIN ASPART 100 UNIT/ML IJ SOLN
0.0000 [IU] | Freq: Three times a day (TID) | INTRAMUSCULAR | Status: DC
  Administered 2022-08-06: 5 [IU] via SUBCUTANEOUS

## 2022-08-06 NOTE — Procedures (Signed)
Ultrasound-guided diagnostic and therapeutic left thoracentesis performed yielding 500 cc of amber/blood-tinged fluid. No immediate complications. Follow-up chest x-ray pending. The fluid was sent to the lab for preordered studies. EBL< 2 cc.

## 2022-08-06 NOTE — Progress Notes (Signed)
Rounding Note    Patient Name: [name] Date of Encounter: 08/06/2022  [facility] Cardiologist: Olga Millers, MD   Subjective   Patient very sleepy/lethargic on my exam. Limited contribution to exam. Does not appear to be in pain or have shortness of breath.  Inpatient Medications    Scheduled Meds:  amiodarone  200 mg Oral Daily   amLODipine  10 mg Oral Daily   apixaban  2.5 mg Oral BID   atorvastatin  40 mg Oral QHS   carvedilol  12.5 mg Oral BID WC   furosemide  40 mg Intravenous Q12H   insulin aspart  0-6 Units Subcutaneous TID WC   insulin glargine-yfgn  10 Units Subcutaneous QHS   latanoprost  1 drop Both Eyes QHS   pantoprazole  40 mg Oral QAC breakfast   potassium chloride  30 mEq Oral Q4H   potassium chloride  20 mEq Oral Daily   sacubitril-valsartan  1 tablet Oral BID   sodium chloride flush  3 mL Intravenous Q12H   Continuous Infusions:  PRN Meds: acetaminophen **OR** acetaminophen, diclofenac Sodium, senna   Vital Signs    Vitals:   08/05/22 1700 08/05/22 2026 08/06/22 0450 08/06/22 0548  BP:  (!) 117/54  (!) 104/49  Pulse:  69  70  Resp:  18  16  Temp:  98 F (36.7 C)  97.9 F (36.6 C)  TempSrc:      SpO2: 96% 97%  (!) 87%  Weight:   38.1 kg   Height:        Intake/Output Summary (Last 24 hours) at 08/06/2022 0817 Last data filed at 08/05/2022 2347 Gross per 24 hour  Intake 840 ml  Output 700 ml  Net 140 ml      08/06/2022    4:50 AM 08/05/2022    5:00 AM 08/04/2022    5:00 AM  Last 3 Weights  Weight (lbs) 84 lb 1 oz 84 lb 7 oz 87 lb 15.4 oz  Weight (kg) 38.13 kg 38.3 kg 39.9 kg      Telemetry    Sinus rhythm with PACs - Personally Reviewed  ECG    No new tracing - Personally Reviewed  Physical Exam   GEN: No acute distress, sleepy/somnolent Neck: No JVD Cardiac: RRR, no murmurs, rubs, or gallops.  Respiratory: breath sounds diminished in bilateral bases GI: Soft, nontender, non-distended  MS: No  edema; No deformity. Neuro:  Nonfocal  Psych: Normal affect   Labs    High Sensitivity Troponin:   Recent Labs  Lab 08/03/22 1714 08/03/22 1915  TROPONINIHS 17 19*     Chemistry Recent Labs  Lab 08/03/22 1714 08/03/22 2237 08/04/22 0344 08/04/22 1621 08/05/22 0357 08/06/22 0447  NA 137 138 142 139 140 137  K 3.4* 3.6 2.8* 4.0 3.9 3.3*  CL 104 99 107 102 102 101  CO2 16* GLUCOSE 309* 358* 47* 181* 230* 211*  BUN 26* 29* 25* 31* 28* 44*  CREATININE 1.34* 1.32* 1.13* 1.56* 1.30* 1.54*  CALCIUM 9.6 9.6 9.3 9.1 9.4 8.7*  MG  --  1.8 2.0  --   --   --   PROT 7.4  --   --   --   --   --   ALBUMIN 3.8  --   --   --   --   --   AST 92*  --   --   --   --   --  ALT 87*  --   --   --   --   --   ALKPHOS 131*  --   --   --   --   --   BILITOT 0.9  --   --   --   --   --   GFRNONAA 38* 39* 47* 32* 40* 32*  ANIONGAP 17* 14 9 13 11 12     Lipids No results for input(s): "CHOL", "TRIG", "HDL", "LABVLDL", "LDLCALC", "CHOLHDL" in the last 168 hours.  Hematology Recent Labs  Lab 08/03/22 1714 08/04/22 0344 08/05/22 0357  WBC 6.9 9.3 8.0  RBC 2.41* 2.37*  2.40* 2.80*  HGB 8.3* 8.3* 9.6*  HCT 25.7* 25.2* 30.3*  MCV 106.6* 106.3* 108.2*  MCH 34.4* 35.0* 34.3*  MCHC 32.3 32.9 31.7  RDW 15.5 15.7* 15.5  PLT 292 285 300   Thyroid No results for input(s): "TSH", "FREET4" in the last 168 hours.  BNP Recent Labs  Lab 08/03/22 1714  BNP 1,269.5*    DDimer No results for input(s): "DDIMER" in the last 168 hours.   Radiology    DG CHEST PORT 1 VIEW  Result Date: 08/04/2022 CLINICAL DATA:  Dyspnea EXAM: PORTABLE CHEST 1 VIEW COMPARISON:  X-ray 08/03/2022 and older FINDINGS: Persistent moderate pleural effusions and adjacent opacities. No pneumothorax. Vascular congestion within enlarged heart. Calcified aorta. Calcifications of the tracheobronchial tree. Overlapping cardiac leads. IMPRESSION: No significant interval change when adjusting for technique  Electronically Signed   By: Karen Kays M.D.   On: 08/04/2022 16:19    Cardiac Studies   06/14/22 TTE   IMPRESSIONS     1. Left ventricular ejection fraction, by estimation, is 50 to 55%. The  left ventricle has low normal function. The left ventricle has no regional  wall motion abnormalities. Left ventricular diastolic parameters are  indeterminate.   2. Right ventricular systolic function is low normal. The right  ventricular size is normal.   3. The mitral valve is degenerative. Mild mitral valve regurgitation. No  evidence of mitral stenosis.   4. No systolic flow reversal of IVC spectral doppler. Tricuspid valve  regurgitation is moderate.   5. The aortic valve was not well visualized. Aortic valve regurgitation  is not visualized.   Comparison(s): Tricuspid regurgitation has increased.   FINDINGS   Left Ventricle: Left ventricular ejection fraction, by estimation, is 50  to 55%. The left ventricle has low normal function. The left ventricle has  no regional wall motion abnormalities. The left ventricular internal  cavity size was small. There is no  left ventricular hypertrophy. Left ventricular diastolic parameters are  indeterminate.   Right Ventricle: The right ventricular size is normal. No increase in  right ventricular wall thickness. Right ventricular systolic function is  low normal.   Left Atrium: Left atrial size was normal in size.   Right Atrium: Right atrial size was normal in size.   Pericardium: There is no evidence of pericardial effusion.   Mitral Valve: The mitral valve is degenerative in appearance. Mild mitral  annular calcification. Mild mitral valve regurgitation. No evidence of  mitral valve stenosis.   Tricuspid Valve: No systolic flow reversal of IVC spectral doppler. The  tricuspid valve is grossly normal. Tricuspid valve regurgitation is  moderate.   Aortic Valve: The aortic valve was not well visualized. Aortic valve   regurgitation is not visualized.   Pulmonic Valve: The pulmonic valve was not well visualized. Pulmonic valve  regurgitation is not visualized. No evidence  of pulmonic stenosis.   Aorta: The aortic root and ascending aorta are structurally normal, with  no evidence of dilitation.   IAS/Shunts: No atrial level shunt detected by color flow Doppler.  Patient Profile     [name] is a 89 y.o. female with a hx of paroxysmal atrial fibrillation, atrial flutter, prior CVA, diastolic CHF, hypertension, coronary artery calcifications, pleural effusions, mild MR, moderate TR, PAD, hyperlipidemia, DM who is being seen for the evaluation of acute heart failure at the request of Dr. Luberta Robertson.   Assessment & Plan    Acute diastolic congestive heart failure Acute hypoxic respiratory failure   Patient admitted with progressive dyspnea after several days of missed lasix due to delayed refill. BNP on admission 1269.5. This is patient's second admission in the last 2 months for CHF exacerbation.    Overall sounds as though patient was doing well prior to missed doses of Lasix. Patient's eGFR borderline for initiation of SGLT2 but would plan to add MRA if patient's renal function stabilizes/BP tolerates CXR with pleural effusions, appear slightly worse this admission over last. Suspect this is primary source of dyspnea. Bilateral lower lobes diminished on exam. Given longstanding nature of pleural effusions, not clear that lasix will resolve. Could consider tapping. Lasix stopped with increased increased BUN/creatinine.   Atrial fibrillation Secondary hypercoagulable state CHA2DS2-VASc Score = 7    NSR with controlled rates at this time. Continue Amiodarone and Eliquis.    Hypertension   Patient's BP improved, now low-normal this morning.    Continue Entresto 24-26mg  BID. Would not plan to increase at this time given elevated creatinine. Continue Amlodipine  Decrease Coreg given low  BP. Consider adding MRA if renal function stabilizes    Per primary team:   DM type II CKD IIIa Anemia     For questions or updates, please contact [facility] Please consult [email or web address] for contact info under        Signed, Perlie Gold, PA-C  08/06/2022, 8:17 AM

## 2022-08-06 NOTE — Progress Notes (Addendum)
PROGRESS NOTE    [name]  ZOX:[id number] DOB: [birth year] DOA: 08/03/2022 PCP: [name], MD    Brief Narrative:   [name] is a 89 y.o. female with past medical history significant for HTN, chronic diastolic congestive heart failure, HLD, history of CVA, paroxysmal atrial fibrillation on Eliquis, type 2 diabetes mellitus, peripheral artery disease, CKD stage IIIa who presented to [facility] ED on 04/20 complaining of shortness of breath.  Patient was recently hospitalized and discharged for CHF exacerbation on June 15, 2022 with home oxygen and Lasix.  Apparently her oxygen ran out and she has also had not been taking her Lasix over the last 2-3 days prior to admission.  In the ED, temperature 98.3 F, HR 69, RR 24, BP 149/64, SpO2 78% on room air.  VBG with pH 7.44, pCO2 33, pO2 less than 31.  WBC 6.9, hemoglobin 8.3, platelets 292.  Sodium 137, potassium 3.4, chloride 104, CO2 16, glucose 309, BUN 26, creatinine 1.34.  AST 92, ALT 87, total bili 0.9.  BNP 1269.  High sensitive troponin 17.  Chest x-ray with moderate bilateral layering effusions with perihilar and lower lobe airspace opacities concerning for pulmonary edema.  Patient was given IV Lasix metoprolol in the ED.  TRH consulted for admission for further evaluation and management of acute on chronic hypoxic respite failure secondary to decompensated diastolic CHF.  Assessment & Plan:   Acute on chronic hypoxic respiratory failure, POA Acute on chronic decompensated diastolic CHF Patient presented to ED with progressive shortness of breath, had run out of her home O2 which was set at 1 L/min as well as missing several doses of her diuretic.  Patient was hypoxic with SpO2 78% on room air on ED presentation.  Patient is afebrile without leukocytosis.  BNP notably elevated at 1269 with chest x-ray findings of bilateral pleural effusions and pulmonary edema.  Recent TTE 06/14/2022 with LVEF 50-55%, no LV regional  wall motion abnormalities, mild MR, moderate TR. -- Cardiology following, appreciate assistance -- net negative 2L since admission -- Lasix  IV q12h -- Strict I's and O's and daily weights -- Continue supplemental oxygen, maintain SpO2 greater than 92% -- BMP daily  Bilateral pleural effusions -- IR consulted for thoracentesis  Hypokalemia Potassium 3.3, will continue repletion today given IV diuresis. -- Repeat electrolytes including magnesium in the a.m.  Paroxysmal atrial fibrillation -- Amiodarone 200 mg p.o. daily -- Eliquis  Type 2 diabetes mellitus Hemoglobin A1c 7.21 May 2022.  Home regimen includes Lantus 10 units [place] daily, metformin 500 mg p.o. twice daily. -- Hold oral hypoglycemics while inpatient -- Semglee 20 u [place] daily -- Moderate SSI for coverage -- CBGs qAC/HS  Essential hypertension -- Amlodipine 10 mg p.o. daily -- Carvedilol 12.5 mg p.o. twice daily -- Entresto 24-26 mg p.o. twice daily  CKD stage IIIa Baseline creatinine 1.3.,  Stable. -- BMP daily while on IV diuresis  Peripheral artery disease -- Continue Eliquis 2.5 mg p.o. twice daily  Anemia Anemia panel with iron 37, TIBC 393, ferritin 82, folate 35.0, vitamin B12 258. -- Hgb  9.6; stable.  GERD --Protonix 40 mg p.o. daily  Weakness/deconditioning/gait disturbance: -- PT recommending SNF placement -- OT consult: Pending -- TOC consulted for SNF placement   DVT prophylaxis: apixaban (ELIQUIS) tablet 2.5 mg Start: 08/03/22 2200 apixaban (ELIQUIS) tablet 2.5 mg    Code Status: Full Code Family Communication: No family present at bedside this morning  Disposition Plan:  Level of  care: Telemetry Status is: Inpatient Remains inpatient appropriate because: IV diuresis, pending IR thoracentesis    Consultants:  Cardiology Interventional radiology  Procedures:  Ultrasound thoracentesis: Pending  Antimicrobials:  None   Subjective: Patient seen examined bedside,  resting comfortably.  Sitting in bedside chair.  Continues on oxygen with shortness of breath with exertion.  Discussed with patient regarding pleural effusions and benefit with thoracentesis for removal, she agrees to proceed.  No other specific questions or concerns at this time.  Nursing concern regarding her blood pressure at this morning and will hold Coreg, Entresto, and amlodipine.  Patient denies headache, no current dizziness, no chest pain, no palpitations, no abdominal pain, no focal weakness, no fatigue, no nausea/vomiting/diarrhea, no fever/chills/night sweats, no paresthesias.  No acute events overnight per nursing staff.  Objective: Vitals:   08/05/22 2026 08/06/22 0450 08/06/22 0548 08/06/22 1002  BP: (!) 117/54  (!) 104/49   Pulse: 69  70   Resp: 18  16   Temp: 98 F (36.7 C)  97.9 F (36.6 C)   TempSrc:      SpO2: 97%  (!) 87% (!) 84%  Weight:  38.1 kg    Height:        Intake/Output Summary (Last 24 hours) at 08/06/2022 1044 Last data filed at 08/06/2022 0900 Gross per 24 hour  Intake 820 ml  Output 350 ml  Net 470 ml   Filed Weights   08/04/22 0500 08/05/22 0500 08/06/22 0450  Weight: 39.9 kg 38.3 kg 38.1 kg    Examination:  Physical Exam: GEN: NAD, alert and oriented x 3, elderly in appearance HEENT: NCAT, PERRL, EOMI, sclera clear, MMM PULM: Breath sounds diminished bilateral bases with mild crackles, no wheezing, normal respiratory effort without accessory muscle use, on 4 L nasal cannula CV: RRR w/o M/G/R GI: abd soft, NTND, NABS, no R/G/M MSK: no peripheral edema, muscle strength globally intact 5/5 bilateral upper/lower extremities NEURO: CN II-XII intact, no focal deficits, sensation to light touch intact PSYCH: normal mood/affect Integumentary: dry/intact, no rashes or wounds    Data Reviewed: I have personally reviewed following labs and imaging studies  CBC: Recent Labs  Lab 08/03/22 1714 08/04/22 0344 08/05/22 0357  WBC 6.9 9.3 8.0   NEUTROABS 6.0  --   --   HGB 8.3* 8.3* 9.6*  HCT 25.7* 25.2* 30.3*  MCV 106.6* 106.3* 108.2*  PLT 292 285 300   Basic Metabolic Panel: Recent Labs  Lab 08/03/22 2237 08/04/22 0344 08/04/22 1621 08/05/22 0357 08/06/22 0447  NA 138 142 139 140 137  K 3.6 2.8* 4.0 3.9 3.3*  CL 99 107 102 102 101  CO2 GLUCOSE 358* 47* 181* 230* 211*  BUN 29* 25* 31* 28* 44*  CREATININE 1.32* 1.13* 1.56* 1.30* 1.54*  CALCIUM 9.6 9.3 9.1 9.4 8.7*  MG 1.8 2.0  --   --  1.9   GFR: Estimated Creatinine Clearance: 15.5 mL/min (A) (by C-G formula based on SCr of 1.54 mg/dL (H)). Liver Function Tests: Recent Labs  Lab 08/03/22 1714  AST 92*  ALT 87*  ALKPHOS 131*  BILITOT 0.9  PROT 7.4  ALBUMIN 3.8   No results for input(s): "LIPASE", "AMYLASE" in the last 168 hours. No results for input(s): "AMMONIA" in the last 168 hours. Coagulation Profile: No results for input(s): "INR", "PROTIME" in the last 168 hours. Cardiac Enzymes: No results for input(s): "CKTOTAL", "CKMB", "CKMBINDEX", "TROPONINI" in the last 168 hours. BNP (last 3 results)  No results for input(s): "PROBNP" in the last 8760 hours. HbA1C: No results for input(s): "HGBA1C" in the last 72 hours. CBG: Recent Labs  Lab 08/05/22 0738 08/05/22 1133 08/05/22 1633 08/05/22 2013 08/06/22 0737  GLUCAP 218* 239* 242* 446* 171*   Lipid Profile: No results for input(s): "CHOL", "HDL", "LDLCALC", "TRIG", "CHOLHDL", "LDLDIRECT" in the last 72 hours. Thyroid Function Tests: No results for input(s): "TSH", "T4TOTAL", "FREET4", "T3FREE", "THYROIDAB" in the last 72 hours. Anemia Panel: Recent Labs    08/04/22 0344  VITAMINB12 258  FOLATE 35.0  FERRITIN 82  TIBC 393  IRON 37  RETICCTPCT 5.1*   Sepsis Labs: No results for input(s): "PROCALCITON", "LATICACIDVEN" in the last 168 hours.  No results found for this or any previous visit (from the past 240 hour(s)).       Radiology Studies: DG CHEST PORT 1  VIEW  Result Date: 08/04/2022 CLINICAL DATA:  Dyspnea EXAM: PORTABLE CHEST 1 VIEW COMPARISON:  X-ray 08/03/2022 and older FINDINGS: Persistent moderate pleural effusions and adjacent opacities. No pneumothorax. Vascular congestion within enlarged heart. Calcified aorta. Calcifications of the tracheobronchial tree. Overlapping cardiac leads. IMPRESSION: No significant interval change when adjusting for technique Electronically Signed   By: Karen Kays M.D.   On: 08/04/2022 16:19        Scheduled Meds:  amiodarone  200 mg Oral Daily   amLODipine  10 mg Oral Daily   apixaban  2.5 mg Oral BID   atorvastatin  40 mg Oral QHS   carvedilol  12.5 mg Oral BID WC   furosemide  40 mg Intravenous Q12H   insulin aspart  0-6 Units Subcutaneous TID WC   insulin glargine-yfgn  10 Units Subcutaneous QHS   latanoprost  1 drop Both Eyes QHS   pantoprazole  40 mg Oral QAC breakfast   potassium chloride  30 mEq Oral Q4H   potassium chloride  20 mEq Oral Daily   sacubitril-valsartan  1 tablet Oral BID   sodium chloride flush  3 mL Intravenous Q12H   Continuous Infusions:   LOS: 3 days    Time spent: 52 minutes spent on chart review, discussion with nursing staff, consultants, updating family and interview/physical exam; more than 50% of that time was spent in counseling and/or coordination of care.    Alvira Philips [place], DO [facility] Available via Epic secure chat 7am-7pm After these hours, please refer to coverage provider listed on [email or web address] 08/06/2022, 10:44 AM

## 2022-08-06 NOTE — TOC Initial Note (Signed)
Transition of Care [facility]) - Initial/Assessment Note    Patient Details  Name: [name] MRN: [id number] Date of Birth: 01/28/1933  [facility] CM/SW Contact:    Howell Rucks, RN Phone Number: 08/06/2022, 1:23 PM  Clinical Narrative:   Met with pt and dtr at bedside to discuss PT recommendation for short term Rehab-SNF. Pt's dtr declined short term rehabSNF, confirmed pt will be returning home under her care and assistance. Confirmed pt has potty chair, RW and Rollator DME at home. Family will provide transport home at dc. TOC will continue to follow.                 Expected Discharge Plan: Home/Self Care Barriers to Discharge: Continued Medical Work up   Patient Goals and CMS Choice     Choice offered to / list presented to : Adult Children  ownership interest in [facility].provided to:: Adult Children    Expected Discharge Plan and Services   Discharge Planning Services: CM Consult   Living arrangements for the past 2 months: Single Family Home                                      Prior Living Arrangements/Services Living arrangements for the past 2 months: Single Family Home Lives with:: Self Patient language and need for interpreter reviewed:: Yes Do you feel safe going back to the place where you live?: Yes      Need for Family Participation in Patient Care: Yes (Comment) Care giver support system in place?: Yes (comment) Current home services: DME (Rollator, RW, potty chair) Criminal Activity/Legal Involvement Pertinent to Current Situation/Hospitalization: No - Comment as needed  Activities of Daily Living Home Assistive Devices/Equipment: Wheelchair, Medical laboratory scientific officer (specify quad or straight) ADL Screening (condition at time of admission) Patient's cognitive ability adequate to safely complete daily activities?: Yes Is the patient deaf or have difficulty hearing?: No Does the patient have difficulty seeing, even when  wearing glasses/contacts?: No Does the patient have difficulty concentrating, remembering, or making decisions?: No Patient able to express need for assistance with ADLs?: Yes Does the patient have difficulty dressing or bathing?: No Independently performs ADLs?: Yes (appropriate for developmental age) Does the patient have difficulty walking or climbing stairs?: Yes Weakness of Legs: Both Weakness of Arms/Hands: None  Permission Sought/Granted Permission sought to share information with : Case Manager Permission granted to share information with : Yes, Verbal Permission Granted  Share Information with NAME: Fannie Knee, RN           Emotional Assessment Appearance:: Appears stated age Attitude/Demeanor/Rapport: Gracious Affect (typically observed): Accepting Orientation: : Oriented to Place, Oriented to  Time, Oriented to Self Alcohol / Substance Use: Not Applicable Psych Involvement: No (comment)  Admission diagnosis:  Hypoxia [R09.02] Acute on chronic diastolic CHF (congestive heart failure) [I50.33] Acute on chronic congestive heart failure, unspecified heart failure type [I50.9] Patient Active Problem List   Diagnosis Date Noted   Acute on chronic diastolic CHF (congestive heart failure) 08/03/2022   Macrocytic anemia 08/03/2022   CKD (chronic kidney disease) stage 3, GFR 30-59 ml/min 08/03/2022   Acute on chronic respiratory failure with hypoxia 08/03/2022   AKI (acute kidney injury) 06/15/2022   Hypertensive urgency 06/15/2022   Dyspnea 06/13/2022   CHF (congestive heart failure) 06/12/2022   Protein-calorie malnutrition, severe 07/31/2021   Vascular dementia with delirium 07/31/2021   Critical  limb ischemia of left lower extremity 07/26/2021   PAD (peripheral artery disease) 07/26/2021   Need for prophylactic antibiotic 02/14/2020   Atrial flutter with rapid ventricular response 02/13/2020   Hypomagnesemia 02/13/2020   Atrial flutter 02/12/2020   Cramp of both  lower extremities 12/15/2014   Foot cramps 12/15/2014   Stiffness of foot 06/10/2014   Aftercare following surgery of the circulatory system, NEC 12/09/2013   Peripheral vascular disease (HCC) 10/03/2011   Bypass graft stenosis 07/04/2011   Chronic total occlusion of artery of the extremities 07/04/2011   Pure hypercholesterolemia 03/29/2009   Essential hypertension, malignant 03/29/2009   Essential hypertension 03/28/2009   Type 2 diabetes mellitus with hyperlipidemia 08/30/2008   HYPERLIPIDEMIA-MIXED 08/30/2008   Atrial fibrillation 08/30/2008   SYNCOPE AND COLLAPSE 08/30/2008   PCP:  [name], MD Pharmacy:   [facility] [phone] [place]in[place] Otto, [place] - [address] [place] [address] Phone: [phone] Fax: [phone]     Social Determinants of Health (SDOH) Social History: SDOH Screenings   Food Insecurity: Patient Declined (08/04/2022)  Housing: Low Risk  (08/04/2022)  Transportation Needs: Patient Declined (08/04/2022)  Utilities: Patient Declined (08/04/2022)  Alcohol Screen: Low Risk  (06/13/2021)  Depression (PHQ2-9): Low Risk  (06/13/2021)  Financial Resource Strain: Low Risk  (01/31/2020)  Physical Activity: Sufficiently Active (01/31/2020)  Stress: No Stress Concern Present (08/30/2019)  Tobacco Use: Low Risk  (08/03/2022)   SDOH Interventions: Housing Interventions: Patient Refused   Readmission Risk Interventions    08/06/2022    1:20 PM  Readmission Risk Prevention Plan  Transportation Screening Complete  PCP or Specialist Appt within 3-5 Days Complete  HRI or Home Care Consult Complete  Social Work Consult for Recovery Care Planning/Counseling Complete  Palliative Care Screening Not Applicable  Medication Review Oceanographer) Complete

## 2022-08-06 NOTE — Progress Notes (Signed)
Mobility Specialist - Progress Note   08/06/22 1002  Oxygen Therapy  SpO2 (!) 84 %  O2 Device Nasal Cannula  Heater temperature 39.2 F (4 C)  Patient Activity (if Appropriate) Ambulating  Mobility  Activity Ambulated with assistance in hallway  Level of Assistance Contact guard assist, steadying assist  Assistive Device Front wheel walker  Distance Ambulated (ft) 100 ft  Activity Response Tolerated well  Mobility Referral Yes  $Mobility charge 1 Mobility   Pt received in recliner and agreeable to mobility. Pt was MinA from sit>stand & contact during ambulation. Pt required verbal cues for walker direction. When returning to room pt desat to 84%. Encouraged pursed lip breaths when seated bring O2 back up to 95%. No complaints during session. Pt to recliner after session with all needs met & call bell in reach.   Pre-mobility: HR, 97% SpO2 (4L [place]) During mobility: HR, 84% SpO2 (4L [place]) Post-mobility: HR, 95% SPO2 (4L [place])   Chief Technology Officer

## 2022-08-07 ENCOUNTER — Ambulatory Visit: Payer: Medicare HMO | Admitting: Student

## 2022-08-07 DIAGNOSIS — J9621 Acute and chronic respiratory failure with hypoxia: Secondary | ICD-10-CM | POA: Diagnosis not present

## 2022-08-07 DIAGNOSIS — I5033 Acute on chronic diastolic (congestive) heart failure: Secondary | ICD-10-CM | POA: Diagnosis not present

## 2022-08-07 DIAGNOSIS — J90 Pleural effusion, not elsewhere classified: Secondary | ICD-10-CM | POA: Diagnosis not present

## 2022-08-07 LAB — BASIC METABOLIC PANEL
Anion gap: 8 (ref 5–15)
BUN: 50 mg/dL — ABNORMAL HIGH (ref 8–23)
CO2: 25 mmol/L (ref 22–32)
Calcium: 8.8 mg/dL — ABNORMAL LOW (ref 8.9–10.3)
Chloride: 107 mmol/L (ref 98–111)
Creatinine, Ser: 1.29 mg/dL — ABNORMAL HIGH (ref 0.44–1.00)
GFR, Estimated: 40 mL/min — ABNORMAL LOW (ref >60)
Glucose, Bld: 87 mg/dL (ref 70–99)
Potassium: 3.9 mmol/L (ref 3.5–5.1)
Sodium: 140 mmol/L (ref 135–145)

## 2022-08-07 LAB — CYTOLOGY - NON PAP

## 2022-08-07 LAB — BODY FLUID CULTURE W GRAM STAIN

## 2022-08-07 LAB — GLUCOSE, RANDOM: Glucose, Bld: 458 mg/dL — ABNORMAL HIGH (ref 70–99)

## 2022-08-07 LAB — GLUCOSE, CAPILLARY
Glucose-Capillary: 70 mg/dL (ref 70–99)
Glucose-Capillary: 424 mg/dL — ABNORMAL HIGH (ref 70–99)
Glucose-Capillary: 189 mg/dL — ABNORMAL HIGH (ref 70–99)

## 2022-08-07 LAB — MAGNESIUM: Magnesium: 2.1 mg/dL (ref 1.7–2.4)

## 2022-08-07 MED ORDER — ENTRESTO 24-26 MG PO TABS: 1.0000 | ORAL_TABLET | Freq: Two times a day (BID) | ORAL | 2 refills | Status: DC

## 2022-08-07 MED ORDER — INSULIN ASPART 100 UNIT/ML IJ SOLN
2.0000 [IU] | Freq: Three times a day (TID) | INTRAMUSCULAR | Status: DC
  Administered 2022-08-07: 2 [IU] via SUBCUTANEOUS

## 2022-08-07 MED ORDER — INSULIN GLARGINE-YFGN 100 UNIT/ML ~~LOC~~ SOLN
15.0000 [IU] | Freq: Every day | SUBCUTANEOUS | Status: DC
  Filled 2022-08-07: qty 0.15

## 2022-08-07 MED ORDER — INSULIN ASPART 100 UNIT/ML IJ SOLN
10.0000 [IU] | Freq: Once | INTRAMUSCULAR | Status: AC
  Administered 2022-08-07: 10 [IU] via SUBCUTANEOUS

## 2022-08-07 MED ORDER — CARVEDILOL 6.25 MG PO TABS: 6.2500 mg | ORAL_TABLET | Freq: Two times a day (BID) | ORAL | 2 refills | Status: DC

## 2022-08-07 MED ORDER — FUROSEMIDE 40 MG PO TABS: 40.0000 mg | ORAL_TABLET | Freq: Every day | ORAL | 2 refills | Status: DC

## 2022-08-07 MED ORDER — AMIODARONE HCL 200 MG PO TABS: 200.0000 mg | ORAL_TABLET | Freq: Every day | ORAL | 2 refills | Status: DC

## 2022-08-07 MED ORDER — INSULIN ASPART 100 UNIT/ML IJ SOLN
0.0000 [IU] | Freq: Three times a day (TID) | INTRAMUSCULAR | Status: DC
  Administered 2022-08-07: 2 [IU] via SUBCUTANEOUS

## 2022-08-07 NOTE — TOC Transition Note (Signed)
Transition of Care [facility]) - CM/SW Discharge Note   Patient Details  Name: AMYLYNN FANO MRN: [id number] Date of Birth: May 22, 1932  Transition of Care [facility]) CM/SW Contact:  Howell Rucks, RN Phone Number: 08/07/2022, 3:32 PM   Clinical Narrative:  DC Home with HH                PT/OT, sw pt's dtr ([name]), preferred [facility], per rep-Christian, able to accept for [facility] PT/OT. No further TOC needs identified     Final next level of care: Home w Home Health Services Barriers to Discharge: Barriers Resolved   Patient Goals and CMS Choice   Choice offered to / list presented to : Adult Children  Discharge Placement                         Discharge Plan and Services Additional resources added to the After Visit Summary for     Discharge Planning Services: CM Consult Post Acute Care Choice: Durable Medical Equipment          DME Arranged: Oxygen DME Agency: [employer] Date DME Agency Contacted: 08/07/22 Time DME Agency Contacted: [phone] Representative spoke with at DME Agency: Velna Hatchet HH Arranged: PT, OT [facility] Agency: [facility] Date [facility] Agency Contacted: 08/07/22 Time HH Agency Contacted: [phone] Representative spoke with at [facility] Agency: Ephriam Knuckles  Social Determinants of Health (SDOH) Interventions SDOH Screenings   Food Insecurity: Patient Declined (08/04/2022)  Housing: Low Risk  (08/04/2022)  Transportation Needs: Patient Declined (08/04/2022)  Utilities: Patient Declined (08/04/2022)  Alcohol Screen: Low Risk  (06/13/2021)  Depression (PHQ2-9): Low Risk  (06/13/2021)  Financial Resource Strain: Low Risk  (01/31/2020)  Physical Activity: Sufficiently Active (01/31/2020)  Stress: No Stress Concern Present (08/30/2019)  Tobacco Use: Low Risk  (08/03/2022)     Readmission Risk Interventions    08/06/2022    1:20 PM  Readmission Risk Prevention Plan  Transportation Screening Complete  PCP or Specialist Appt within 3-5 Days Complete  HRI or Home Care Consult  Complete  Social Work Consult for Recovery Care Planning/Counseling Complete  Palliative Care Screening Not Applicable  Medication Review Oceanographer) Complete

## 2022-08-07 NOTE — Discharge Summary (Signed)
Physician Discharge Summary  [name] ZOX:[id number] DOB: 23-Sep-1932 DOA: 08/03/2022  PCP: [name], MD  Admit date: 08/03/2022 Discharge date: 08/07/2022  Admitted From: Home Disposition: Home with home health, declined SNF placement  Recommendations for Outpatient Follow-up:  Follow up with PCP in 1-2 weeks Patient follow-up with cardiology, Dr. Jens Som Discontinued metoprolol in favor of carvedilol 6.25 mg p.o. twice daily Increase furosemide to 40 mg p.o. daily Please obtain BMP in one week to assess renal function  Home Health: PT/OT/RN Equipment/Devices: Oxygen, 1 L per nasal cannula, otherwise has available equipment already at home  Discharge Condition: Stable CODE STATUS: Full code Diet recommendation: Heart healthy/consistent carbohydrate diet  History of present illness:  [name] is a 89 y.o. female with past medical history significant for HTN, chronic diastolic congestive heart failure, HLD, history of CVA, paroxysmal atrial fibrillation on Eliquis, type 2 diabetes mellitus, peripheral artery disease, CKD stage IIIa who presented to [facility] ED on 04/20 complaining of shortness of breath.  Patient was recently hospitalized and discharged for CHF exacerbation on June 15, 2022 with home oxygen and Lasix.  Apparently her oxygen ran out and she has also had not been taking her Lasix over the last 2-3 days prior to admission.   In the ED, temperature 98.3 F, HR 69, RR 24, BP 149/64, SpO2 78% on room air.  VBG with pH 7.44, pCO2 33, pO2 less than 31.  WBC 6.9, hemoglobin 8.3, platelets 292.  Sodium 137, potassium 3.4, chloride 104, CO2 16, glucose 309, BUN 26, creatinine 1.34.  AST 92, ALT 87, total bili 0.9.  BNP 1269.  High sensitive troponin 17.  Chest x-ray with moderate bilateral layering effusions with perihilar and lower lobe airspace opacities concerning for pulmonary edema.  Patient was given IV Lasix metoprolol in the ED.  TRH consulted  for admission for further evaluation and management of acute on chronic hypoxic respite failure secondary to decompensated diastolic CHF.  Hospital course:  Acute on chronic hypoxic respiratory failure, POA Acute on chronic decompensated diastolic CHF Patient presented to ED with progressive shortness of breath, had run out of her home O2 which was set at 1 L/min as well as missing several doses of her diuretic.  Patient was hypoxic with SpO2 78% on room air on ED presentation.  Patient is afebrile without leukocytosis.  BNP notably elevated at 1269 with chest x-ray findings of bilateral pleural effusions and pulmonary edema.  Recent TTE 06/14/2022 with LVEF 50-55%, no LV regional wall motion abnormalities, mild MR, moderate TR. cardiology was consulted and followed during hospital course.  Patient was started on IV diuresis with improvement of symptoms.  Patient was titrated off of Supple oxygen at rest but required 1 L nasal cannula while ambulating.  Will continue furosemide 4 mg p.o. daily on discharge.  Outpatient follow-up with PCP/cardiology.  Recommend repeat BMP 1 week.   Bilateral pleural effusions Underwent thoracentesis by interventional radiology on 08/06/2022 with removal of 500 mL from the left.   Hypokalemia Repleted during hospitalization.   Paroxysmal atrial fibrillation Continue amiodarone 200 mg p.o. daily, Eliquis   Type 2 diabetes mellitus Hemoglobin A1c 7.21 May 2022.  Home regimen includes Lantus 10 units [place] daily, metformin 500 mg p.o. twice daily.  Outpatient follow-up with PCP.   Essential hypertension Amlodipine 10 mg p.o. daily, Carvedilol 12.5 mg p.o. twice daily, Entresto 24-26 mg p.o. twice daily, furosemide 40 mg p.o. daily.   CKD stage IIIa Baseline creatinine 1.3.,  1.29 at time of discharge.  Stable.   Peripheral artery disease Continue Eliquis 2.5 mg p.o. twice daily   Anemia Anemia panel with iron 37, TIBC 393, ferritin 82, folate 35.0, vitamin B12  258.  Hgb  9.6; stable.   GERD Protonix 40 mg p.o. daily   Weakness/deconditioning/gait disturbance: Evaluated by PT and OT down the hospitalization with recommendation of SNF placement.  Family decided to decline and return home with resumption of home health services.   Discharge Diagnoses:  Principal Problem:   Acute on chronic diastolic CHF (congestive heart failure) Active Problems:   Type 2 diabetes mellitus with hyperlipidemia   Essential hypertension, malignant   Atrial fibrillation   PAD (peripheral artery disease)   Vascular dementia with delirium   Macrocytic anemia   CKD (chronic kidney disease) stage 3, GFR 30-59 ml/min   Acute on chronic respiratory failure with hypoxia   Pleural effusion    Discharge Instructions  Discharge Instructions     Call MD for:  difficulty breathing, headache or visual disturbances   Complete by: As directed    Call MD for:  extreme fatigue   Complete by: As directed    Call MD for:  persistant dizziness or light-headedness   Complete by: As directed    Call MD for:  persistant nausea and vomiting   Complete by: As directed    Call MD for:  severe uncontrolled pain   Complete by: As directed    Call MD for:  temperature >100.4   Complete by: As directed    Diet - low sodium heart healthy   Complete by: As directed    Increase activity slowly   Complete by: As directed       Allergies as of 08/07/2022   No Known Allergies      Medication List     STOP taking these medications    metoprolol tartrate 50 MG tablet Commonly known as: LOPRESSOR       TAKE these medications    Accu-Chek Softclix Lancets lancets   acetaminophen 325 MG tablet Commonly known as: TYLENOL Take 650 mg by mouth every 6 (six) hours as needed for mild pain or headache.   amiodarone 200 MG tablet Commonly known as: PACERONE Take 1 tablet (200 mg total) by mouth daily.   amLODipine 10 MG tablet Commonly known as: NORVASC Take 1 tablet  (10 mg total) by mouth daily.   atorvastatin 40 MG tablet Commonly known as: LIPITOR Take 1 tablet (40 mg total) by mouth daily. What changed: when to take this   carvedilol 6.25 MG tablet Commonly known as: COREG Take 1 tablet (6.25 mg total) by mouth 2 (two) times daily with a meal.   Centrum Silver Ultra Womens Tabs Take 1 tablet by mouth at bedtime.   diclofenac Sodium 1 % Gel Commonly known as: VOLTAREN Apply 2 g topically 2 (two) times daily as needed (for pain- affected areas).   docusate sodium 100 MG capsule Commonly known as: COLACE Take 100 mg by mouth 2 (two) times daily. What changed: Another medication with the same name was removed. Continue taking this medication, and follow the directions you see here.   Eliquis 2.5 MG Tabs tablet Generic drug: apixaban TAKE 1 TABLET BY MOUTH TWICE A DAY What changed: how much to take   Entresto 24-26 MG Generic drug: sacubitril-valsartan Take 1 tablet by mouth 2 (two) times daily.   ezetimibe 10 MG tablet Commonly known as: ZETIA Take 1 tablet (10  mg total) by mouth daily. What changed: when to take this   Fish Oil 1200 MG Caps Take 1,200 mg by mouth daily with lunch.   furosemide 40 MG tablet Commonly known as: LASIX Take 1 tablet (40 mg total) by mouth daily. What changed:  medication strength how much to take   GINKGO BILOBA PO Take 2 tablets by mouth daily with lunch.   glipiZIDE 10 MG tablet Commonly known as: GLUCOTROL Take 10 mg by mouth See admin instructions. Take 10 mg by mouth before breakfast and lunch   Lantus SoloStar 100 UNIT/ML Solostar Pen Generic drug: insulin glargine Inject 10 Units into the skin at bedtime.   latanoprost 0.005 % ophthalmic solution Commonly known as: XALATAN Place 1 drop into both eyes at bedtime.   metFORMIN 500 MG tablet Commonly known as: GLUCOPHAGE Take 1 tablet (500 mg total) by mouth 2 (two) times daily.   ondansetron 4 MG tablet Commonly known as:  ZOFRAN Take 4 mg by mouth every 8 (eight) hours as needed for nausea or vomiting.   pantoprazole 40 MG tablet Commonly known as: PROTONIX Take 40 mg by mouth daily before breakfast.               Durable Medical Equipment  (From admission, onward)           Start     Ordered   08/07/22 1342  For home use only DME oxygen  Once       Question Answer Comment  Length of Need Lifetime   Mode or (Route) Nasal cannula   Liters per Minute 1   Frequency Continuous (stationary and portable oxygen unit needed)   Oxygen conserving device Yes   Oxygen delivery system Gas      08/07/22 1341            Follow-up Information     [name], MD. Schedule an appointment as soon as possible for a visit in 1 week(s).   Specialty: Family Medicine Contact information: [address] [place] [address] [phone]         Lewayne Bunting, MD. Schedule an appointment as soon as possible for a visit.   Specialty: Cardiology Contact information: [address] [place] [address] [phone]                No Known Allergies  Consultations: Cardiology Interventional radiology   Procedures/Studies: US THORACENTESIS ASP PLEURAL SPACE W/IMG GUIDE  Result Date: 08/06/2022 INDICATION: Patient with history of congestive heart failure, chronic kidney disease, dyspnea, bilateral effusions left greater right. Request received for diagnostic and therapeutic left thoracentesis. EXAM: ULTRASOUND GUIDED DIAGNOSTIC AND THERAPEUTIC LEFT THORACENTESIS MEDICATIONS: 8 mL 1% lidocaine COMPLICATIONS: None immediate. PROCEDURE: An ultrasound guided thoracentesis was thoroughly discussed with the patient and questions answered. The benefits, risks, alternatives and complications were also discussed. The patient understands and wishes to proceed with the procedure. Written consent was obtained. Ultrasound was performed to localize and mark an adequate pocket of  fluid in the left chest. The area was then prepped and draped in the normal sterile fashion. 1% Lidocaine was used for local anesthesia. Under ultrasound guidance a 6 Fr Safe-T-Centesis catheter was introduced. Thoracentesis was performed. The catheter was removed and a dressing applied. FINDINGS: A total of approximately 500 cc of amber/blood-tinged fluid was removed. Samples were sent to the laboratory as requested by the clinical team. IMPRESSION: Successful ultrasound guided diagnostic and therapeutic left thoracentesis yielding 500 cc  of pleural fluid. Read by: Jeananne Rama, PA-C Electronically Signed   By: Marliss Coots M.D.   On: 08/06/2022 15:04   DG CHEST PORT 1 VIEW  Result Date: 08/06/2022 CLINICAL DATA:  [id number] Status post thoracentesis [id number] EXAM: PORTABLE CHEST - 1 VIEW COMPARISON:  08/04/2022 FINDINGS: Near complete evacuation of left pleural effusion. No pneumothorax. Persistent moderate right pleural effusion. Some interval improvement in the bilateral interstitial edema or infiltrates. Heart size and mediastinal contours are within normal limits. Aortic Atherosclerosis (ICD10-170.0). Visualized bones unremarkable. IMPRESSION: 1. Near complete evacuation of left pleural effusion. No pneumothorax. 2. Persistent right pleural effusion. Electronically Signed   By: Corlis Leak M.D.   On: 08/06/2022 14:17   DG CHEST PORT 1 VIEW  Result Date: 08/04/2022 CLINICAL DATA:  Dyspnea EXAM: PORTABLE CHEST 1 VIEW COMPARISON:  X-ray 08/03/2022 and older FINDINGS: Persistent moderate pleural effusions and adjacent opacities. No pneumothorax. Vascular congestion within enlarged heart. Calcified aorta. Calcifications of the tracheobronchial tree. Overlapping cardiac leads. IMPRESSION: No significant interval change when adjusting for technique Electronically Signed   By: Karen Kays M.D.   On: 08/04/2022 16:19   DG Chest Portable 1 View  Result Date: 08/03/2022 CLINICAL DATA:  Shortness of breath, hypoxia  EXAM: PORTABLE CHEST 1 VIEW COMPARISON:  06/13/2022 FINDINGS: Heart and mediastinal contours within normal limits, stable. Moderate layering bilateral effusions with bilateral airspace disease, predominately perihilar and lower lobe. Aortic atherosclerosis. No acute bony abnormality. IMPRESSION: Moderate bilateral layering effusions with bilateral perihilar and lower lobe airspace opacities. Findings could reflect edema or infection. Findings similar to prior study. Electronically Signed   By: Charlett Nose M.D.   On: 08/03/2022 17:31   VAS [place] ABI WITH/WO TBI  Result Date: 07/19/2022  LOWER EXTREMITY DOPPLER STUDY Patient Name:  JOSEPHYNE TARTER  Date of Exam:   07/19/2022 Medical Rec #: [id number]         Accession #:    [id number] Date of Birth: 11/06/1932        Patient Gender: F Patient Age:   31 years Exam Location:  [facility] Procedure:      VAS [place] ABI WITH/WO TBI Referring Phys: EMMA COLLINS --------------------------------------------------------------------------------  Indications: Peripheral artery disease High Risk Factors: Hypertension, hyperlipidemia, Diabetes. Other Factors: Known occluded left above knee to below knee popliteal bypass                graft.  Vascular Interventions: Left common femoral endarterectomy 07/26/2021. Performing Technologist: Elita Quick RVT  Examination Guidelines: A complete evaluation includes at minimum, Doppler waveform signals and systolic blood pressure reading at the level of bilateral brachial, anterior tibial, and posterior tibial arteries, when vessel segments are accessible. Bilateral testing is considered an integral part of a complete examination. Photoelectric Plethysmograph (PPG) waveforms and toe systolic pressure readings are included as required and additional duplex testing as needed. Limited examinations for reoccurring indications may be performed as noted.  ABI Findings: +---------+------------------+-----+--------+--------+ Right     Rt Pressure (mmHg)IndexWaveformComment  +---------+------------------+-----+--------+--------+ Brachial 143                                     +---------+------------------+-----+--------+--------+ PTA      254               1.78 biphasic         +---------+------------------+-----+--------+--------+ DP       254  1.78 biphasic         +---------+------------------+-----+--------+--------+ Great Toe65                0.45 Normal           +---------+------------------+-----+--------+--------+ +---------+------------------+-----+--------+-------+ Left     Lt Pressure (mmHg)IndexWaveformComment +---------+------------------+-----+--------+-------+ Brachial 128                                    +---------+------------------+-----+--------+-------+ PTA      254               1.78 biphasic        +---------+------------------+-----+--------+-------+ DP       254               1.78 biphasic        +---------+------------------+-----+--------+-------+ Great Toe68                0.48 Abnormal        +---------+------------------+-----+--------+-------+ +-------+-----------+-----------+------------+------------+ ABI/TBIToday's ABIToday's TBIPrevious ABIPrevious TBI +-------+-----------+-----------+------------+------------+ Right  [place]         0.45       [place]          0.55         +-------+-----------+-----------+------------+------------+ Left   [place]         0.48       [place]          0.20         +-------+-----------+-----------+------------+------------+  Bilateral TBIs appear essentially unchanged.  Summary: Right: Resting right ankle-brachial index indicates noncompressible right lower extremity arteries. The right toe-brachial index is abnormal. Left: Resting left ankle-brachial index indicates noncompressible left lower extremity arteries. The left toe-brachial index is abnormal. *See table(s) above for measurements and observations.   Electronically signed by Gerarda Fraction on 07/19/2022 at 5:55:41 PM.    Final    VAS [place] LOWER EXTREMITY ARTERIAL DUPLEX  Result Date: 07/19/2022 LOWER EXTREMITY ARTERIAL DUPLEX STUDY Patient Name:  AVAH BASHOR  Date of Exam:   07/19/2022 Medical Rec #: [id number]         Accession #:    [id number] Date of Birth: Jun 15, 1932        Patient Gender: F Patient Age:   89 years Exam Location:  [facility] Procedure:      VAS [place] LOWER EXTREMITY ARTERIAL DUPLEX Referring Phys: EMMA COLLINS --------------------------------------------------------------------------------  Indications: Peripheral artery disease. High Risk Factors: Hypertension, hyperlipidemia, Diabetes, no history of                    smoking. Other Factors: Known occluded left above knee to below knee popliteal bypass                graft.  Vascular Interventions: Left common femoral endarterectomy 07/26/2021. Current ABI:            Bilateral ABI [place] Performing Technologist: Elita Quick RVT  Examination Guidelines: A complete evaluation includes B-mode imaging, spectral Doppler, color Doppler, and power Doppler as needed of all accessible portions of each vessel. Bilateral testing is considered an integral part of a complete examination. Limited examinations for reoccurring indications may be performed as noted.   +-----------+--------+-----+---------------+----------+--------+ LEFT       PSV cm/sRatioStenosis       Waveform  Comments +-----------+--------+-----+---------------+----------+--------+ EIA Distal 168  triphasic          +-----------+--------+-----+---------------+----------+--------+ CFA Prox   119                         triphasic          +-----------+--------+-----+---------------+----------+--------+ DFA        94                          biphasic           +-----------+--------+-----+---------------+----------+--------+ SFA Prox   59                          biphasic            +-----------+--------+-----+---------------+----------+--------+ SFA Mid    224          50-74% stenosisbiphasic  broad    +-----------+--------+-----+---------------+----------+--------+ SFA Distal 94                          biphasic           +-----------+--------+-----+---------------+----------+--------+ POP Prox   181                         biphasic           +-----------+--------+-----+---------------+----------+--------+ POP Mid    47                          monophasic         +-----------+--------+-----+---------------+----------+--------+ ATA Distal 15                          monophasic         +-----------+--------+-----+---------------+----------+--------+ PTA Distal 59                          monophasic         +-----------+--------+-----+---------------+----------+--------+ PERO Distal10                          monophasic         +-----------+--------+-----+---------------+----------+--------+  Summary: Patent left lower extremity with 50 - 74% stenosis. Unable to visualize left AK to BK BPG. NOTE: This was a suboptimal exam, limited visualization   See table(s) above for measurements and observations. Electronically signed by Gerarda Fraction on 07/19/2022 at 5:55:08 PM.    Final      Subjective: Patient seen examined at bedside, resting comfortably.  Sitting in bedside chair.  No specific complaints this morning.  Titrated off of supplemental oxygen while at rest but when working with PT today did desaturate requiring 1 L with ambulation.  Cardiology now signed off today.  Discharging home as family and patient have declined SNF placement and desire to return to home with home health services.  Denies headache, no dizziness, no chest pain, no shortness of breath, no abdominal pain, no fever/chills/night sweats, no nausea/vomiting/diarrhea.  No acute events overnight per nursing staff.  Discharge Exam: Vitals:   08/07/22 0811  08/07/22 1216  BP: (!) 138/55 (!) 137/58  Pulse: 70 70  Resp:  15  Temp:  (!) 97.5 F (36.4 C)  SpO2:  98%   Vitals:   08/06/22 1956 08/07/22 0530 08/07/22 0811 08/07/22 1216  BP: (!) 123/52  (!) 138/55 (!) 137/58  Pulse: 79  70 70  Resp: 20   15  Temp: 98.6 F (37 C)   (!) 97.5 F (36.4 C)  TempSrc: Oral   Oral  SpO2: 91%   98%  Weight:  38 kg    Height:        Physical Exam: GEN: NAD, alert and oriented x 3, chronically ill/elderly in appearance HEENT: NCAT, PERRL, EOMI, sclera clear, MMM PULM: Breath sounds slight diminished right base, otherwise no crackles/wheezing, normal respiratory effort, on room air at rest CV: RRR w/o M/G/R GI: abd soft, NTND, NABS, no R/G/M MSK: no peripheral edema, muscle strength globally intact 5/5 bilateral upper/lower extremities NEURO: CN II-XII intact, no focal deficits, sensation to light touch intact PSYCH: normal mood/affect Integumentary: dry/intact, no rashes or wounds    The results of significant diagnostics from this hospitalization (including imaging, microbiology, ancillary and laboratory) are listed below for reference.     Microbiology: Recent Results (from the past 240 hour(s))  Body fluid culture w Gram Stain     Status: None (Preliminary result)   Collection Time: 08/06/22  1:20 PM   Specimen: PATH Cytology Peritoneal fluid  Result Value Ref Range Status   Specimen Description   Final    PERITONEAL Performed at [facility], [address], [place], [place] [address]    Special Requests   Final    NONE Performed at [facility], [address], [place], [place] [address]    Gram Stain NO WBC SEEN NO ORGANISMS SEEN   Final   Culture   Final    NO GROWTH < 24 HOURS Performed at [facility], [address], [place], [place] [address]    Report Status PENDING  Incomplete     Labs: BNP (last 3 results) Recent Labs    06/12/22 1248 08/03/22 1714  BNP 1,283.1*  1,269.5*   Basic Metabolic Panel: Recent Labs  Lab 08/03/22 2237 08/04/22 0344 08/04/22 1621 08/05/22 0357 08/06/22 0447 08/06/22 1431 08/07/22 0416 08/07/22 1223  NA 138 142 139 140 137  --  140  --   K 3.6 2.8* 4.0 3.9 3.3*  --  3.9  --   CL 99 107 102 102 101  --  107  --   CO2 25 26 24 27 24   --  25  --   GLUCOSE 358* 47* 181* 230* 211* 278* 87 458*  BUN 29* 25* 31* 28* 44*  --  50*  --   CREATININE 1.32* 1.13* 1.56* 1.30* 1.54*  --  1.29*  --   CALCIUM 9.6 9.3 9.1 9.4 8.7*  --  8.8*  --   MG 1.8 2.0  --   --  1.9  --  2.1  --    Liver Function Tests: Recent Labs  Lab 08/03/22 1714  AST 92*  ALT 87*  ALKPHOS 131*  BILITOT 0.9  PROT 7.4  ALBUMIN 3.8   No results for input(s): "LIPASE", "AMYLASE" in the last 168 hours. No results for input(s): "AMMONIA" in the last 168 hours. CBC: Recent Labs  Lab 08/03/22 1714 08/04/22 0344 08/05/22 0357  WBC 6.9 9.3 8.0  NEUTROABS 6.0  --   --   HGB 8.3* 8.3* 9.6*  HCT 25.7* 25.2* 30.3*  MCV 106.6* 106.3* 108.2*  PLT 292 285 300   Cardiac Enzymes: No results for input(s): "CKTOTAL", "CKMB", "CKMBINDEX", "TROPONINI" in the last 168 hours. BNP:  Invalid input(s): "POCBNP" CBG: Recent Labs  Lab 08/06/22 1123 08/06/22 1720 08/06/22 2149 08/07/22 0724 08/07/22 1059  GLUCAP 550* 244* 322* 70 424*   D-Dimer No results for input(s): "DDIMER" in the last 72 hours. Hgb A1c No results for input(s): "HGBA1C" in the last 72 hours. Lipid Profile No results for input(s): "CHOL", "HDL", "LDLCALC", "TRIG", "CHOLHDL", "LDLDIRECT" in the last 72 hours. Thyroid function studies No results for input(s): "TSH", "T4TOTAL", "T3FREE", "THYROIDAB" in the last 72 hours.  Invalid input(s): "FREET3" Anemia work up No results for input(s): "VITAMINB12", "FOLATE", "FERRITIN", "TIBC", "IRON", "RETICCTPCT" in the last 72 hours. Urinalysis    Component Value Date/Time   COLORURINE COLORLESS (A) 07/28/2021 1742   APPEARANCEUR CLEAR  07/28/2021 1742   LABSPEC 1.003 (L) 07/28/2021 1742   PHURINE 9.0 (H) 07/28/2021 1742   GLUCOSEU NEGATIVE 07/28/2021 1742   HGBUR NEGATIVE 07/28/2021 1742   BILIRUBINUR NEGATIVE 07/28/2021 1742   KETONESUR NEGATIVE 07/28/2021 1742   PROTEINUR NEGATIVE 07/28/2021 1742   UROBILINOGEN 0.2 10/28/2007 0214   NITRITE NEGATIVE 07/28/2021 1742   LEUKOCYTESUR NEGATIVE 07/28/2021 1742   Sepsis Labs Recent Labs  Lab 08/03/22 1714 08/04/22 0344 08/05/22 0357  WBC 6.9 9.3 8.0   Microbiology Recent Results (from the past 240 hour(s))  Body fluid culture w Gram Stain     Status: None (Preliminary result)   Collection Time: 08/06/22  1:20 PM   Specimen: PATH Cytology Peritoneal fluid  Result Value Ref Range Status   Specimen Description   Final    PERITONEAL Performed at [facility], [address] [place], [place] [address]    Special Requests   Final    NONE Performed at [facility], [address], [place], [place] [address]    Gram Stain NO WBC SEEN NO ORGANISMS SEEN   Final   Culture   Final    NO GROWTH < 24 HOURS Performed at [facility], [address] [address], [place], [place] [address]    Report Status PENDING  Incomplete     Time coordinating discharge: Over 30 minutes  SIGNED:   Alvira Philips [place], DO  [facility] 08/07/2022, 2:51 PM

## 2022-08-07 NOTE — TOC Initial Note (Deleted)
Transition of Care [facility]) - Initial/Assessment Note    Patient Details  Name: [name] MRN: [id number] Date of Birth: [birth year]  Transition [facility]) CM/SW Contact:    Howell Rucks, RN Phone Number: 08/07/2022, 3:28 PM  Clinical Narrative:  DC Home with HH                PT/OT, sw pt's dtr ([name]), preferred [facility], per rep-Christian, able to accept for [facility] PT/OT. No further TOC needs identified  Expected Discharge Plan: Home/Self Care Barriers to Discharge: Barriers Resolved   Patient Goals and CMS Choice Patient states their goals for this hospitalization and ongoing recovery are:: Home with [facility] PT/OT   Choice offered to / list presented to : Adult Children [place] ownership interest in [facility].provided to:: Adult Children    Expected Discharge Plan and Services   Discharge Planning Services: CM Consult Post Acute Care Choice: Durable Medical Equipment Living arrangements for the past 2 months: Single Family Home Expected Discharge Date: 08/07/22               DME Arranged: Oxygen DME Agency: [employer] Date DME Agency Contacted: 08/07/22 Time DME Agency Contacted: [phone] Representative spoke with at DME Agency: Velna Hatchet HH Arranged: PT, OT HH Agency: [facility] Date [facility] Agency Contacted: 08/07/22 Time HH Agency Contacted: [phone] Representative spoke with at [facility] Agency: Christian  Prior Living Arrangements/Services Living arrangements for the past 2 months: Single Family Home Lives with:: Self Patient language and need for interpreter reviewed:: Yes Do you feel safe going back to the place where you live?: Yes      Need for Family Participation in Patient Care: Yes (Comment) Care giver support system in place?: Yes (comment) Current home services: DME (Rollator, RW, potty chair) Criminal Activity/Legal Involvement Pertinent to Current Situation/Hospitalization: No - Comment as needed  Activities of Daily Living Home  Assistive Devices/Equipment: Wheelchair, Medical laboratory scientific officer (specify quad or straight) ADL Screening (condition at time of admission) Patient's cognitive ability adequate to safely complete daily activities?: Yes Is the patient deaf or have difficulty hearing?: No Does the patient have difficulty seeing, even when wearing glasses/contacts?: No Does the patient have difficulty concentrating, remembering, or making decisions?: No Patient able to express need for assistance with ADLs?: Yes Does the patient have difficulty dressing or bathing?: No Independently performs ADLs?: Yes (appropriate for developmental age) Does the patient have difficulty walking or climbing stairs?: Yes Weakness of Legs: Both Weakness of Arms/Hands: None  Permission Sought/Granted Permission sought to share information with : Case Manager Permission granted to share information with : Yes, Verbal Permission Granted  Share Information with NAME: Fannie Knee, RN           Emotional Assessment Appearance:: Appears stated age Attitude/Demeanor/Rapport: Gracious Affect (typically observed): Accepting Orientation: : Oriented to Place, Oriented to  Time, Oriented to Self Alcohol / Substance Use: Not Applicable Psych Involvement: No (comment)  Admission diagnosis:  Hypoxia [R09.02] Acute on chronic diastolic CHF (congestive heart failure) [I50.33] Acute on chronic congestive heart failure, unspecified heart failure type [I50.9] Patient Active Problem List   Diagnosis Date Noted   Pleural effusion 08/06/2022   Acute on chronic diastolic CHF (congestive heart failure) 08/03/2022   Macrocytic anemia 08/03/2022   CKD (chronic kidney disease) stage 3, GFR 30-59 ml/min 08/03/2022   Acute on chronic respiratory failure with hypoxia 08/03/2022   AKI (acute kidney injury) 06/15/2022   Hypertensive urgency 06/15/2022   Dyspnea 06/13/2022   CHF (  congestive heart failure) 06/12/2022   Protein-calorie malnutrition, severe 07/31/2021    Vascular dementia with delirium 07/31/2021   Critical limb ischemia of left lower extremity 07/26/2021   PAD (peripheral artery disease) 07/26/2021   Need for prophylactic antibiotic 02/14/2020   Atrial flutter with rapid ventricular response 02/13/2020   Hypomagnesemia 02/13/2020   Atrial flutter 02/12/2020   Cramp of both lower extremities 12/15/2014   Foot cramps 12/15/2014   Stiffness of foot 06/10/2014   Aftercare following surgery of the circulatory system, NEC 12/09/2013   Peripheral vascular disease (HCC) 10/03/2011   Bypass graft stenosis 07/04/2011   Chronic total occlusion of artery of the extremities 07/04/2011   Pure hypercholesterolemia 03/29/2009   Essential hypertension, malignant 03/29/2009   Essential hypertension 03/28/2009   Type 2 diabetes mellitus with hyperlipidemia 08/30/2008   HYPERLIPIDEMIA-MIXED 08/30/2008   Atrial fibrillation 08/30/2008   SYNCOPE AND COLLAPSE 08/30/2008   PCP:  [name], MD Pharmacy:   [facility] [phone] Ginette Otto, [place] - [address] [place] [address] Phone: [phone] Fax: [phone]     Social Determinants of Health (SDOH) Social History: SDOH Screenings   Food Insecurity: Patient Declined (08/04/2022)  Housing: Low Risk  (08/04/2022)  Transportation Needs: Patient Declined (08/04/2022)  Utilities: Patient Declined (08/04/2022)  Alcohol Screen: Low Risk  (06/13/2021)  Depression (PHQ2-9): Low Risk  (06/13/2021)  Financial Resource Strain: Low Risk  (01/31/2020)  Physical Activity: Sufficiently Active (01/31/2020)  Stress: No Stress Concern Present (08/30/2019)  Tobacco Use: Low Risk  (08/03/2022)   SDOH Interventions: Housing Interventions: Patient Refused   Readmission Risk Interventions    08/06/2022    1:20 PM  Readmission Risk Prevention Plan  Transportation Screening Complete  PCP or Specialist Appt within 3-5 Days Complete  HRI or Home Care Consult Complete  Social  Work Consult for Recovery Care Planning/Counseling Complete  Palliative Care Screening Not Applicable  Medication Review Oceanographer) Complete

## 2022-08-07 NOTE — TOC CM/SW Note (Signed)
    Durable Medical Equipment  (From admission, onward)           Start     Ordered   08/07/22 1342  For home use only DME oxygen  Once       Question Answer Comment  Length of Need Lifetime   Mode or (Route) Nasal cannula   Liters per Minute 1   Frequency Continuous (stationary and portable oxygen unit needed)   Oxygen conserving device Yes   Oxygen delivery system Gas      08/07/22 1341

## 2022-08-07 NOTE — Progress Notes (Signed)
Rounding Note    Patient Name: [name] Date of Encounter: 08/07/2022  [facility] Cardiologist: Olga Millers, MD   Subjective   No acute overnight events. When asked if she feels like she is breathing better after her thoracentesis yesterday, she states "I didn't even know I had fluid around my heart." RN states patient is breathing much better today.  No chest pain.  Inpatient Medications    Scheduled Meds:  amiodarone  200 mg Oral Daily   amLODipine  10 mg Oral Daily   apixaban  2.5 mg Oral BID   atorvastatin  40 mg Oral QHS   carvedilol  6.25 mg Oral BID WC   insulin aspart  0-5 Units Subcutaneous QHS   insulin aspart  0-9 Units Subcutaneous TID WC   insulin aspart  10 Units Subcutaneous Once   insulin aspart  2 Units Subcutaneous TID WC   insulin glargine-yfgn  15 Units Subcutaneous QHS   latanoprost  1 drop Both Eyes QHS   pantoprazole  40 mg Oral QAC breakfast   potassium chloride  20 mEq Oral Daily   sacubitril-valsartan  1 tablet Oral BID   sodium chloride flush  3 mL Intravenous Q12H   Continuous Infusions:  PRN Meds: acetaminophen **OR** acetaminophen, diclofenac Sodium, senna   Vital Signs    Vitals:   08/06/22 1956 08/07/22 0530 08/07/22 0811 08/07/22 1216  BP: (!) 123/52  (!) 138/55 (!) 137/58  Pulse: 79  70 70  Resp: 20   15  Temp: 98.6 F (37 C)   (!) 97.5 F (36.4 C)  TempSrc: Oral   Oral  SpO2: 91%   98%  Weight:  38 kg    Height:        Intake/Output Summary (Last 24 hours) at 08/07/2022 1228 Last data filed at 08/07/2022 1219 Gross per 24 hour  Intake 626 ml  Output 250 ml  Net 376 ml      08/07/2022    5:30 AM 08/06/2022    4:50 AM 08/05/2022    5:00 AM  Last 3 Weights  Weight (lbs) 83 lb 12.4 oz 84 lb 1 oz 84 lb 7 oz  Weight (kg) 38 kg 38.13 kg 38.3 kg      Telemetry    Normal sinus rhythm with rates in the 70s. - Personally Reviewed  ECG    No new ECG tracing today. - Personally Reviewed  Physical  Exam   GEN: Thin African-American female resting comfortably in no acute distress.   Neck: No JVD. Cardiac: RRR. No murmurs, rubs, or gallops.  Respiratory: Diminished breath sounds in right bases. Improved breath sounds in left base after thoracentesis. No wheezes, rhonchi, or rales. GI: Soft, non-distended, and non-tender. MS: No lower extremity edema. No deformity. Skin: Warm and dry. Neuro:  No focal deficits. Psych: Normal affect. Responds appropriately.  Labs    High Sensitivity Troponin:   Recent Labs  Lab 08/03/22 1714 08/03/22 1915  TROPONINIHS 17 19*     Chemistry Recent Labs  Lab 08/03/22 1714 08/03/22 2237 08/04/22 0344 08/04/22 1621 08/05/22 0357 08/06/22 0447 08/06/22 1431 08/07/22 0416  NA 137   < > 142   < > 140 137  --  140  K 3.4*   < > 2.8*   < > 3.9 3.3*  --  3.9  CL 104   < > 107   < > 102 101  --  107  CO2 16*   < >  26   < > 27 24  --  25  GLUCOSE 309*   < > 47*   < > 230* 211* 278* 87  BUN 26*   < > 25*   < > 28* 44*  --  50*  CREATININE 1.34*   < > 1.13*   < > 1.30* 1.54*  --  1.29*  CALCIUM 9.6   < > 9.3   < > 9.4 8.7*  --  8.8*  MG  --    < > 2.0  --   --  1.9  --  2.1  PROT 7.4  --   --   --   --   --   --   --   ALBUMIN 3.8  --   --   --   --   --   --   --   AST 92*  --   --   --   --   --   --   --   ALT 87*  --   --   --   --   --   --   --   ALKPHOS 131*  --   --   --   --   --   --   --   BILITOT 0.9  --   --   --   --   --   --   --   GFRNONAA 38*   < > 47*   < > 40* 32*  --  40*  ANIONGAP 17*   < > 9   < > 11 12  --  8   < > = values in this interval not displayed.    Lipids No results for input(s): "CHOL", "TRIG", "HDL", "LABVLDL", "LDLCALC", "CHOLHDL" in the last 168 hours.  Hematology Recent Labs  Lab 08/03/22 1714 08/04/22 0344 08/05/22 0357  WBC 6.9 9.3 8.0  RBC 2.41* 2.37*  2.40* 2.80*  HGB 8.3* 8.3* 9.6*  HCT 25.7* 25.2* 30.3*  MCV 106.6* 106.3* 108.2*  MCH 34.4* 35.0* 34.3*  MCHC 32.3 32.9 31.7  RDW 15.5  15.7* 15.5  PLT 292 285 300   Thyroid No results for input(s): "TSH", "FREET4" in the last 168 hours.  BNP Recent Labs  Lab 08/03/22 1714  BNP 1,269.5*    DDimer No results for input(s): "DDIMER" in the last 168 hours.   Radiology    US THORACENTESIS ASP PLEURAL SPACE W/IMG GUIDE  Result Date: 08/06/2022 INDICATION: Patient with history of congestive heart failure, chronic kidney disease, dyspnea, bilateral effusions left greater right. Request received for diagnostic and therapeutic left thoracentesis. EXAM: ULTRASOUND GUIDED DIAGNOSTIC AND THERAPEUTIC LEFT THORACENTESIS MEDICATIONS: 8 mL 1% lidocaine COMPLICATIONS: None immediate. PROCEDURE: An ultrasound guided thoracentesis was thoroughly discussed with the patient and questions answered. The benefits, risks, alternatives and complications were also discussed. The patient understands and wishes to proceed with the procedure. Written consent was obtained. Ultrasound was performed to localize and mark an adequate pocket of fluid in the left chest. The area was then prepped and draped in the normal sterile fashion. 1% Lidocaine was used for local anesthesia. Under ultrasound guidance a 6 Fr Safe-T-Centesis catheter was introduced. Thoracentesis was performed. The catheter was removed and a dressing applied. FINDINGS: A total of approximately 500 cc of amber/blood-tinged fluid was removed. Samples were sent to the laboratory as requested by the clinical team. IMPRESSION: Successful ultrasound guided diagnostic and therapeutic left thoracentesis yielding 500 cc of pleural  fluid. Read by: Jeananne Rama, PA-C Electronically Signed   By: Marliss Coots M.D.   On: 08/06/2022 15:04   DG CHEST PORT 1 VIEW  Result Date: 08/06/2022 CLINICAL DATA:  [id number] Status post thoracentesis [id number] EXAM: PORTABLE CHEST - 1 VIEW COMPARISON:  08/04/2022 FINDINGS: Near complete evacuation of left pleural effusion. No pneumothorax. Persistent moderate right pleural  effusion. Some interval improvement in the bilateral interstitial edema or infiltrates. Heart size and mediastinal contours are within normal limits. Aortic Atherosclerosis (ICD10-170.0). Visualized bones unremarkable. IMPRESSION: 1. Near complete evacuation of left pleural effusion. No pneumothorax. 2. Persistent right pleural effusion. Electronically Signed   By: Corlis Leak M.D.   On: 08/06/2022 14:17    Cardiac Studies   Echocardiogram 06/14/2022: Impressions:  1. Left ventricular ejection fraction, by estimation, is 50 to 55%. The  left ventricle has low normal function. The left ventricle has no regional  wall motion abnormalities. Left ventricular diastolic parameters are  indeterminate.   2. Right ventricular systolic function is low normal. The right  ventricular size is normal.   3. The mitral valve is degenerative. Mild mitral valve regurgitation. No  evidence of mitral stenosis.   4. No systolic flow reversal of IVC spectral doppler. Tricuspid valve  regurgitation is moderate.   5. The aortic valve was not well visualized. Aortic valve regurgitation  is not visualized.   Comparison(s): Tricuspid regurgitation has increased.    Patient Profile     89 y.o. female with a history of coronary artery calcifications noted on chest CT in 05/2022, paroxysmal atrial fibrillation/ flutter on Eliquis, chronic diastolic CHF, PAD s/p left above knee to below knee popliteal artery bypass in 07/2010 and more recently endarterectomy of the left common femoral artery in 07/2021, hypertension, hyperlipidemia, type 2 diabetes mellitus, DVT, and CVA  who was admitted on  08/03/2022 for acute on chronic hypoxic respiratory failure secondary to acute on diastolic CHF and bilateral pleural effusions. Cardiology consulted for assistance with management of CHF.  Assessment & Plan    Acute on Chronic Hypoxic Respiratory Failure Acute on Chronic Diastolic CHF Bilateral Pleural Effusions Patient presented  with progressive dyspnea after several days of missed Lasix due to delayed refill. Of note, this is patient's 2nd admission in the last 2 months for CHF exacerbation. BNP 1,269.5. Chest x-ray showed moderate bilateral layering effusion with bilateral perihilar and lower lobe airspace opacities which could reflect edema or infection. Recent Echo in 06/14/2022 showed LVEF of 50-55% with no regional wall motion abnormalities and indeterminate diastolic parameters. She was initially started on IV Lasix but this was stopped on 4/23 due to worsening renal function and she underwent a left sided thoracentesis with 500 cc of amber/blood tinged fluid. Net negative 2.18 L this admission. Weight down 5lbs this admission. BUN continues to rise but creatinine improved today. - Euvolemic on exam. - Continue Entresto 24-26mg  twice daily. - Will reach out to Case Manager to run cost of SGLT2 inhibitor. If cost is not an issue, can start SGLT2 inhibitor and decrease Lasix to 20mg  only as needed for weight gain or edema. - Can consider adding Spironolactone as an outpatient if she continues to have issues with volume overload if BP and renal function allow. However, would be cautious with this given how frail she is (If Spironolactone is added in the future, would consider decreasing Amlodipine). - Continue to monitor daily weights, strict I/Os, and renal function.  - Management of pleural effusions per primary team.  Paroxysmal Atrial  Fibrillation/ Flutter Maintaining sinus rhythm.  - Continue Coreg 6.25mg  twice daily. - Continue Amiodarone  daily. - Continue chronic anticoagulation with Eliquis 2.5mg  twice daily. Reduced dose due to age and weight.  Hypertension BP soft yesterday but better today. - Continue current medications: Entresto 24-26mg  twice daily, Coreg 6.25mg  twice daily, Amlodipine  daily.  CKD Stage III Creatinine 1.32 on admission (baseline around 1.2) and peaked at 1.56 on 4/21.  -  Creatinine improved to 1.29 today but BUN continues to rise (28 >> 44 >> 50).  - Continue to monitor closely.   Otherwise, per primary team: - Type 2 diabetes mellitus - PAD - Anemia - History of CVA  For questions or updates, please contact [facility] Please consult [email or web address] for contact info under        Signed, Corrin Parker, PA-C  08/07/2022, 12:28 PM

## 2022-08-07 NOTE — Progress Notes (Signed)
Inpatient Diabetes Program Recommendations  AACE/ADA: New Consensus Statement on Inpatient Glycemic Control (2015)  Target Ranges:  Prepandial:   less than 140 mg/dL      Peak postprandial:   less than 180 mg/dL (1-2 hours)      Critically ill patients:  140 - 180 mg/dL   Lab Results  Component Value Date   GLUCAP 424 (H) 08/07/2022   HGBA1C 7.6 (H) 06/13/2022    Review of Glycemic Control  Latest Reference Range & Units 08/06/22 07:37 08/06/22 11:23 08/06/22 17:20 08/06/22 21:49 08/07/22 07:24 08/07/22 10:59  Glucose-Capillary 70 - 99 mg/dL 161 (H) 096 (HH) 045 (H) 322 (H) 70 424 (H)   Diabetes history: DM 2 Outpatient Diabetes medications:  Glucotrol 10 mg daily Lantus 10 units q HS Metformin 500 mg bid Current orders for Inpatient glycemic control:  Semglee 20 units daily Novolog 0-15 units tid with meals and HS  Inpatient Diabetes Program Recommendations:    Consider reducing Novolog correction to sensitive (0-9 units) tid with meals (0-9 units), add Novolog 2 units tid with meals (hold if patient eats less than 50% or NPO) and reduce Semglee to 15 units q HS (fasting was 70 mg/dL).   Thanks,  Beryl Meager, RN, BC-ADM Inpatient Diabetes Coordinator Pager [phone]  (8a-5p)

## 2022-08-07 NOTE — Progress Notes (Signed)
Physical Therapy Treatment Patient Details Name: [name] MRN: [id number] DOB: 03/29/1933 Today's Date: 08/07/2022   History of Present Illness 89 yo female admitted with dyspnea. Dx: acute resp failure, bil pleural effusions. Hx of CVA, PVD, CHF, DVT, cardioversion, Afib, anemia, cognitive deficits    PT Comments    Pt was in recliner at start of session. O2 sats monitored throughout session. Average O2: 90% on 1 L. When pt's legs were put down in recliner, pt c/o of "being lightheaded". O2 dropped to 85%, encouraged pursed lip breathing, O2 raised back up to 95%. BP: 132/56, 73 HR. Asked for glucose check to be completed. Assisted pt to transfer to [facility], pt did not void. Assisted pt to ambulate down hallway 70 ft with 1 L 02 with 4 standing rest breaks for pursed lip breathing. Pt c/o of "getting tired", assist pt back to recliner following and returned to room. Pt's mobility progressing with decreased instances of O2 dropping  and being able to return to Mid 90% with pursed lip breathing of 1 L O2. Pt plans to D/C to home vs SNF.   Recommendations for follow up therapy are one component of a multi-disciplinary discharge planning process, led by the attending physician.  Recommendations may be updated based on patient status, additional functional criteria and insurance authorization.  Follow Up Recommendations  Can patient physically be transported by private vehicle: Yes    Assistance Recommended at Discharge Frequent or constant Supervision/Assistance  Patient can return home with the following Assist for transportation;Assistance with cooking/housework;Help with stairs or ramp for entrance;A little help with walking and/or transfers;A little help with bathing/dressing/bathroom   Equipment Recommendations  None recommended by PT    Recommendations for Other Services       Precautions / Restrictions Precautions Precautions: Fall Precaution Comments: monitor O2 (was wearing 1L  O2 at baseline) Restrictions Weight Bearing Restrictions: No     Mobility  Bed Mobility               General bed mobility comments: oob in recliner Patient Response: Cooperative  Transfers Overall transfer level: Needs assistance Equipment used: Rolling walker (2 wheels) Transfers: Sit to/from Stand, Bed to chair/wheelchair/BSC Sit to Stand: Min assist   Step pivot transfers: Min assist       General transfer comment: Assist to rise, steady, control descent. Cues for safety, hand placement.    Ambulation/Gait Ambulation/Gait assistance: Min assist Gait Distance (Feet): 70 Feet Assistive device: Rolling walker (2 wheels) Gait Pattern/deviations: Step-through pattern, Decreased stride length, Trunk flexed Gait velocity: decreased     General Gait Details: Assist to stabilize pt and manage RW throughout distance. Pt tends to "hug" L side of walker while ambulating, very literal with VCing. O2 90% on 1L. Dyspnea 1/4.  Fall risk.   Stairs             Wheelchair Mobility    Modified Rankin (Stroke Patients Only)       Balance                                            Cognition Arousal/Alertness: Awake/alert Behavior During Therapy: WFL for tasks assessed/performed Overall Cognitive Status: History of cognitive impairments - at baseline  General Comments: Flat affect, kept "spacing out"        Exercises      General Comments        Pertinent Vitals/Pain Pain Assessment Pain Assessment: No/denies pain    Home Living                          Prior Function            PT Goals (current goals can now be found in the care plan section) Acute Rehab PT Goals Patient Stated Goal: none stated PT Goal Formulation: Patient unable to participate in goal setting Time For Goal Achievement: 08/19/22 Potential to Achieve Goals: Good Progress towards PT goals: Progressing  toward goals    Frequency    Min 1X/week      PT Plan Current plan remains appropriate    Co-evaluation              AM-PAC PT "6 Clicks" Mobility   Outcome Measure  Help needed turning from your back to your side while in a flat bed without using bedrails?: A Little Help needed moving from lying on your back to sitting on the side of a flat bed without using bedrails?: A Little Help needed moving to and from a bed to a chair (including a wheelchair)?: A Little Help needed standing up from a chair using your arms (e.g., wheelchair or bedside chair)?: A Little Help needed to walk in hospital room?: A Little Help needed climbing 3-5 steps with a railing? : A Lot 6 Click Score: 17    End of Session Equipment Utilized During Treatment: Gait belt;Oxygen Activity Tolerance: Patient tolerated treatment well Patient left: in chair;with call bell/phone within reach   PT Visit Diagnosis: Muscle weakness (generalized) (M62.81);Difficulty in walking, not elsewhere classified (R26.2)     Time: 1610-9604 PT Time Calculation (min) (ACUTE ONLY): 26 min  Charges:  $Gait Training: 8-22 mins $Therapeutic Activity: 8-22 mins                     Sharlene Motts, SPTA

## 2022-08-07 NOTE — TOC Benefit Eligibility Note (Signed)
Transition of Care [facility]) Benefit Eligibility Note    Patient Details  Name: [name] MRN: [id number] Date of Birth: Aug 12, 1932   Medication/Dose: London Pepper and Scherrie Gerlach  qd     Tier: Other  Prescription Coverage Preferred Pharmacy: local  Spoke with Person/Company/Phone Number:: Darlene/ [employer] [phone]  Co-Pay: London Pepper is $45.00, Empagliflozin not on formulary Marcelline Deist is $95.00 Dapagliflozin not on formulary Invokana listed as generic on formulary and is $45.00  Prior Approval: No  Deductible: Met  Additional Notes: Patient near donut hole    Caren Macadam Phone Number: 08/07/2022, 1:55 PM

## 2022-08-07 NOTE — Progress Notes (Signed)
SATURATION QUALIFICATIONS: (This note is used to comply with regulatory documentation for home oxygen)  Patient Saturations on Room Air at Rest = 92%%  Patient Saturations on Room Air while Ambulating = 87%  Patient Saturations on 1 Liters of oxygen while Ambulating = 92%  Please briefly explain why patient needs home oxygen: Pt ambulated 175 feet  [name]

## 2022-08-07 NOTE — TOC Progression Note (Addendum)
Transition of Care [facility]) - Progression Note    Patient Details  Name: [name] MRN: [id number] Date of Birth: 07-Mar-1933  Transition of Care [facility]) CM/SW Contact  Howell Rucks, RN Phone Number: 08/07/2022, 11:39 AM  Clinical Narrative:  Per dtr [name]), pt with home 02 order on previous admission on March 2024, home 02 never delivered. Per chart review, pt set up with [employer] for home 02.  NCM call to [employer], sw [name]-rep, confirmed previous order and that home 02 never set up. Pt currently on RA, MD notified of previous home 02 order, states will order O2 walk screen today to see if she needs home O2. If she desaturates will place DME order. Will continue to monitor.  - 1:30pm referral for medication benefit check. Await outcome.     Expected Discharge Plan: Home/Self Care Barriers to Discharge: Continued Medical Work up  Expected Discharge Plan and Services   Discharge Planning Services: CM Consult   Living arrangements for the past 2 months: Single Family Home                                       Social Determinants of Health (SDOH) Interventions SDOH Screenings   Food Insecurity: Patient Declined (08/04/2022)  Housing: Low Risk  (08/04/2022)  Transportation Needs: Patient Declined (08/04/2022)  Utilities: Patient Declined (08/04/2022)  Alcohol Screen: Low Risk  (06/13/2021)  Depression (PHQ2-9): Low Risk  (06/13/2021)  Financial Resource Strain: Low Risk  (01/31/2020)  Physical Activity: Sufficiently Active (01/31/2020)  Stress: No Stress Concern Present (08/30/2019)  Tobacco Use: Low Risk  (08/03/2022)    Readmission Risk Interventions    08/06/2022    1:20 PM  Readmission Risk Prevention Plan  Transportation Screening Complete  PCP or Specialist Appt within 3-5 Days Complete  HRI or Home Care Consult Complete  Social Work Consult for Recovery Care Planning/Counseling Complete  Palliative Care Screening Not Applicable  Medication Review Furniture conservator/restorer) Complete

## 2022-08-07 NOTE — TOC Progression Note (Signed)
Transition of Care [facility]) - Progression Note    Patient Details  Name: [name] MRN: [id number] Date of Birth: 24-Jul-1932  Transition of Care [facility]) CM/SW Contact  Sallyann Kinnaird, Olegario Messier, RN Phone Number: 08/07/2022, 2:42 PM  Clinical Narrative:  Noted 02 sats per PT note, & home 02 order-[employer] rep Velna Hatchet to deliver home set up to home. Patient already has home 02 travel tank. Continue to monitor.     Expected Discharge Plan: Home/Self Care Barriers to Discharge: Continued Medical Work up  Expected Discharge Plan and Services   Discharge Planning Services: CM Consult Post Acute Care Choice: Durable Medical Equipment Living arrangements for the past 2 months: Single Family Home                 DME Arranged: Oxygen DME Agency: [employer] Date DME Agency Contacted: 08/07/22 Time DME Agency Contacted: [phone] Representative spoke with at DME Agency: Velna Hatchet             Social Determinants of Health (SDOH) Interventions SDOH Screenings   Food Insecurity: Patient Declined (08/04/2022)  Housing: Low Risk  (08/04/2022)  Transportation Needs: Patient Declined (08/04/2022)  Utilities: Patient Declined (08/04/2022)  Alcohol Screen: Low Risk  (06/13/2021)  Depression (PHQ2-9): Low Risk  (06/13/2021)  Financial Resource Strain: Low Risk  (01/31/2020)  Physical Activity: Sufficiently Active (01/31/2020)  Stress: No Stress Concern Present (08/30/2019)  Tobacco Use: Low Risk  (08/03/2022)    Readmission Risk Interventions    08/06/2022    1:20 PM  Readmission Risk Prevention Plan  Transportation Screening Complete  PCP or Specialist Appt within 3-5 Days Complete  HRI or Home Care Consult Complete  Social Work Consult for Recovery Care Planning/Counseling Complete  Palliative Care Screening Not Applicable  Medication Review [employer]) Complete

## 2022-08-07 NOTE — Evaluation (Signed)
Occupational Therapy Evaluation Patient Details Name: [name] MRN: [id number] DOB: June 02, 1932 Today's Date: 08/07/2022   History of Present Illness 89 yo female admitted with dyspnea. Dx: acute resp failure, bil pleural effusions. Hx of CVA, PVD, CHF, DVT, cardioversion, Afib, anemia, cognitive deficits   Clinical Impression   PTA, pt lived alone with frequent assist from daughter during ADL and IADL; daughter also provides 3 meals per day per pt report. Unsure of pt accuracy of report. Upon eval, pt ambulatory with RW with min guard A overall and up to min A for RW management. Pt observed to bump into obstacles in hall multiple times; pt reports no changes in vision but that she normally wears glasses. Up to min A + for UB and LB ADL. Patient will benefit from continued inpatient follow up therapy, <3 hours/day.     Recommendations for follow up therapy are one component of a multi-disciplinary discharge planning process, led by the attending physician.  Recommendations may be updated based on patient status, additional functional criteria and insurance authorization.   Assistance Recommended at Discharge Frequent or constant Supervision/Assistance  Patient can return home with the following A little help with walking and/or transfers;A little help with bathing/dressing/bathroom;Assistance with cooking/housework;Direct supervision/assist for medications management;Direct supervision/assist for financial management;Help with stairs or ramp for entrance;Assist for transportation    Functional Status Assessment  Patient has had a recent decline in their functional status and demonstrates the ability to make significant improvements in function in a reasonable and predictable amount of time.  Equipment Recommendations  BSC/3in1    Recommendations for Other Services       Precautions / Restrictions Precautions Precautions: Fall Precaution Comments: monitor O2 (was wearing 1L O2 at  baseline) Restrictions Weight Bearing Restrictions: No      Mobility Bed Mobility               General bed mobility comments: OOB in hall with RN on arrival; recliner on departure    Transfers Overall transfer level: Needs assistance Equipment used: Rolling walker (2 wheels) Transfers: Sit to/from Stand, Bed to chair/wheelchair/BSC Sit to Stand: Min guard     Step pivot transfers: Min guard, Min assist     General transfer comment: Min A for RW management in small spaces      Balance Overall balance assessment: Needs assistance Sitting-balance support: No upper extremity supported, Feet supported Sitting balance-Leahy Scale: Fair     Standing balance support: During functional activity, Reliant on assistive device for balance, Bilateral upper extremity supported Standing balance-Leahy Scale: Poor Standing balance comment: BUE supported on sink during grooming and BUE on RW with ambulation                           ADL either performed or assessed with clinical judgement   ADL Overall ADL's : Needs assistance/impaired Eating/Feeding: Set up;Sitting   Grooming: Oral care;Wash/dry face;Wash/dry hands;Min guard;Standing Grooming Details (indicate cue type and reason): min guard A for safety. Min cues for locating items due to low vision and poor problem solving Upper Body Bathing: Set up;Sitting   Lower Body Bathing: Minimal assistance;Sit to/from stand   Upper Body Dressing : Supervision/safety (directional cues)   Lower Body Dressing: Minimal assistance   Toilet Transfer: Min guard;Rolling walker (2 wheels);Ambulation Toilet Transfer Details (indicate cue type and reason): Min guard A for safety         Functional mobility during ADLs: Min guard;Rolling walker (2  wheels)       Vision Baseline Vision/History: 1 Wears glasses Ability to See in Adequate Light: 2 Moderately impaired Patient Visual Report: No change from baseline Vision  Assessment?: Vision impaired- to be further tested in functional context Additional Comments: No glassses present. Pt denies changes in vision. Observed to bump into items in room and hall with RW; reports that this is common when she does not have her glasses on     Perception     Praxis      Pertinent Vitals/Pain Pain Assessment Pain Assessment: No/denies pain     Hand Dominance Right   Extremity/Trunk Assessment Upper Extremity Assessment Upper Extremity Assessment: Generalized weakness   Lower Extremity Assessment Lower Extremity Assessment: Defer to PT evaluation   Cervical / Trunk Assessment Cervical / Trunk Assessment: Kyphotic   Communication Communication Communication: No difficulties (Intermittently needs incresaed time to verbally express self)   Cognition Arousal/Alertness: Awake/alert Behavior During Therapy: WFL for tasks assessed/performed Overall Cognitive Status: History of cognitive impairments - at baseline                                 General Comments: Flat affect, difficulty word finding. Follows basic commands, but intermittently needs multimodal/multidirectional cues     General Comments  VSS SpO2>90 on RA    Exercises     Shoulder Instructions      Home Living Family/patient expects to be discharged to:: Skilled nursing facility Living Arrangements: Alone Available Help at Discharge: Family;Available PRN/intermittently Type of Home: House Home Access: Stairs to enter [employer] of Steps: 4 Entrance Stairs-Rails: Right Home Layout: One level     Bathroom Shower/Tub: Tub/shower unit         Home Equipment: Teacher, English as a foreign language (2 wheels);Cane - quad;Rollator (4 wheels);Shower seat;Grab bars - tub/shower   Additional Comments: daughter comes daily to assist      Prior Functioning/Environment Prior Level of Function : Needs assist;Patient poor historian/Family not available             Mobility  Comments: pt unable to recall dme that she used at home ADLs Comments: daughter comes daily to assist as needed, pt does not drive        OT Problem List: Decreased strength;Impaired balance (sitting and/or standing);Decreased activity tolerance;Cardiopulmonary status limiting activity      OT Treatment/Interventions: Self-care/ADL training;Therapeutic exercise;DME and/or AE instruction;Balance training;Patient/family education;Therapeutic activities    OT Goals(Current goals can be found in the care plan section) Acute Rehab OT Goals Patient Stated Goal: get better OT Goal Formulation: With patient Time For Goal Achievement: 08/21/22 Potential to Achieve Goals: Good  OT Frequency: Min 2X/week    Co-evaluation              AM-PAC OT "6 Clicks" Daily Activity     Outcome Measure Help from another person eating meals?: A Little Help from another person taking care of personal grooming?: A Little Help from another person toileting, which includes using toliet, bedpan, or urinal?: A Little Help from another person bathing (including washing, rinsing, drying)?: A Little Help from another person to put on and taking off regular upper body clothing?: A Little Help from another person to put on and taking off regular lower body clothing?: A Little 6 Click Score: 18   End of Session Equipment Utilized During Treatment: Gait belt;Rolling walker (2 wheels) Nurse Communication: Mobility status  Activity Tolerance: Patient tolerated treatment well  Patient left: in chair;with call bell/phone within reach  OT Visit Diagnosis: Unsteadiness on feet (R26.81);Muscle weakness (generalized) (M62.81);Other symptoms and signs involving cognitive function                Time: 1300-1337 OT Time Calculation (min): 37 min Charges:  OT General Charges $OT Visit: 1 Visit OT Evaluation $OT Eval Low Complexity: 1 Low OT Treatments $Self Care/Home Management : 8-22 mins  Tyler Deis,  OTR/L [facility] Acute Rehabilitation Office: [phone]   Myrla Halsted 08/07/2022, 3:07 PM

## 2022-08-08 LAB — BODY FLUID CULTURE W GRAM STAIN: Culture: NO GROWTH

## 2022-08-09 LAB — BODY FLUID CULTURE W GRAM STAIN: Gram Stain: NONE SEEN

## 2022-08-10 DIAGNOSIS — G309 Alzheimer's disease, unspecified: Secondary | ICD-10-CM | POA: Diagnosis not present

## 2022-08-10 DIAGNOSIS — I5033 Acute on chronic diastolic (congestive) heart failure: Secondary | ICD-10-CM | POA: Diagnosis not present

## 2022-08-10 DIAGNOSIS — I2510 Atherosclerotic heart disease of native coronary artery without angina pectoris: Secondary | ICD-10-CM | POA: Diagnosis not present

## 2022-08-10 DIAGNOSIS — I110 Hypertensive heart disease with heart failure: Secondary | ICD-10-CM | POA: Diagnosis not present

## 2022-08-10 DIAGNOSIS — J449 Chronic obstructive pulmonary disease, unspecified: Secondary | ICD-10-CM | POA: Diagnosis not present

## 2022-08-10 DIAGNOSIS — N179 Acute kidney failure, unspecified: Secondary | ICD-10-CM | POA: Diagnosis not present

## 2022-08-10 DIAGNOSIS — J9601 Acute respiratory failure with hypoxia: Secondary | ICD-10-CM | POA: Diagnosis not present

## 2022-08-10 DIAGNOSIS — E1151 Type 2 diabetes mellitus with diabetic peripheral angiopathy without gangrene: Secondary | ICD-10-CM | POA: Diagnosis not present

## 2022-08-10 DIAGNOSIS — I480 Paroxysmal atrial fibrillation: Secondary | ICD-10-CM | POA: Diagnosis not present

## 2022-08-14 DIAGNOSIS — E1151 Type 2 diabetes mellitus with diabetic peripheral angiopathy without gangrene: Secondary | ICD-10-CM | POA: Diagnosis not present

## 2022-08-14 DIAGNOSIS — I2510 Atherosclerotic heart disease of native coronary artery without angina pectoris: Secondary | ICD-10-CM | POA: Diagnosis not present

## 2022-08-14 DIAGNOSIS — I110 Hypertensive heart disease with heart failure: Secondary | ICD-10-CM | POA: Diagnosis not present

## 2022-08-14 DIAGNOSIS — N179 Acute kidney failure, unspecified: Secondary | ICD-10-CM | POA: Diagnosis not present

## 2022-08-14 DIAGNOSIS — G309 Alzheimer's disease, unspecified: Secondary | ICD-10-CM | POA: Diagnosis not present

## 2022-08-14 DIAGNOSIS — I480 Paroxysmal atrial fibrillation: Secondary | ICD-10-CM | POA: Diagnosis not present

## 2022-08-14 DIAGNOSIS — J449 Chronic obstructive pulmonary disease, unspecified: Secondary | ICD-10-CM | POA: Diagnosis not present

## 2022-08-14 DIAGNOSIS — J9601 Acute respiratory failure with hypoxia: Secondary | ICD-10-CM | POA: Diagnosis not present

## 2022-08-14 DIAGNOSIS — I5033 Acute on chronic diastolic (congestive) heart failure: Secondary | ICD-10-CM | POA: Diagnosis not present

## 2022-08-15 DIAGNOSIS — J9601 Acute respiratory failure with hypoxia: Secondary | ICD-10-CM | POA: Diagnosis not present

## 2022-08-15 DIAGNOSIS — E1151 Type 2 diabetes mellitus with diabetic peripheral angiopathy without gangrene: Secondary | ICD-10-CM | POA: Diagnosis not present

## 2022-08-15 DIAGNOSIS — I2510 Atherosclerotic heart disease of native coronary artery without angina pectoris: Secondary | ICD-10-CM | POA: Diagnosis not present

## 2022-08-15 DIAGNOSIS — I110 Hypertensive heart disease with heart failure: Secondary | ICD-10-CM | POA: Diagnosis not present

## 2022-08-15 DIAGNOSIS — I480 Paroxysmal atrial fibrillation: Secondary | ICD-10-CM | POA: Diagnosis not present

## 2022-08-15 DIAGNOSIS — G309 Alzheimer's disease, unspecified: Secondary | ICD-10-CM | POA: Diagnosis not present

## 2022-08-15 DIAGNOSIS — I5033 Acute on chronic diastolic (congestive) heart failure: Secondary | ICD-10-CM | POA: Diagnosis not present

## 2022-08-15 DIAGNOSIS — J449 Chronic obstructive pulmonary disease, unspecified: Secondary | ICD-10-CM | POA: Diagnosis not present

## 2022-08-15 DIAGNOSIS — N179 Acute kidney failure, unspecified: Secondary | ICD-10-CM | POA: Diagnosis not present

## 2022-08-21 DIAGNOSIS — E1151 Type 2 diabetes mellitus with diabetic peripheral angiopathy without gangrene: Secondary | ICD-10-CM | POA: Diagnosis not present

## 2022-08-21 DIAGNOSIS — L603 Nail dystrophy: Secondary | ICD-10-CM | POA: Diagnosis not present

## 2022-08-21 DIAGNOSIS — I739 Peripheral vascular disease, unspecified: Secondary | ICD-10-CM | POA: Diagnosis not present

## 2022-08-21 DIAGNOSIS — L84 Corns and callosities: Secondary | ICD-10-CM | POA: Diagnosis not present

## 2022-08-22 DIAGNOSIS — E1151 Type 2 diabetes mellitus with diabetic peripheral angiopathy without gangrene: Secondary | ICD-10-CM | POA: Diagnosis not present

## 2022-08-22 DIAGNOSIS — E782 Mixed hyperlipidemia: Secondary | ICD-10-CM | POA: Diagnosis not present

## 2022-08-22 DIAGNOSIS — E1169 Type 2 diabetes mellitus with other specified complication: Secondary | ICD-10-CM | POA: Diagnosis not present

## 2022-08-23 DIAGNOSIS — N179 Acute kidney failure, unspecified: Secondary | ICD-10-CM | POA: Diagnosis not present

## 2022-08-23 DIAGNOSIS — I130 Hypertensive heart and chronic kidney disease with heart failure and stage 1 through stage 4 chronic kidney disease, or unspecified chronic kidney disease: Secondary | ICD-10-CM | POA: Diagnosis not present

## 2022-08-23 DIAGNOSIS — J9621 Acute and chronic respiratory failure with hypoxia: Secondary | ICD-10-CM | POA: Diagnosis not present

## 2022-08-23 DIAGNOSIS — N1831 Chronic kidney disease, stage 3a: Secondary | ICD-10-CM | POA: Diagnosis not present

## 2022-08-23 DIAGNOSIS — I480 Paroxysmal atrial fibrillation: Secondary | ICD-10-CM | POA: Diagnosis not present

## 2022-08-23 DIAGNOSIS — E876 Hypokalemia: Secondary | ICD-10-CM | POA: Diagnosis not present

## 2022-08-23 DIAGNOSIS — I5033 Acute on chronic diastolic (congestive) heart failure: Secondary | ICD-10-CM | POA: Diagnosis not present

## 2022-08-23 DIAGNOSIS — D631 Anemia in chronic kidney disease: Secondary | ICD-10-CM | POA: Diagnosis not present

## 2022-08-23 DIAGNOSIS — J449 Chronic obstructive pulmonary disease, unspecified: Secondary | ICD-10-CM | POA: Diagnosis not present

## 2022-08-25 ENCOUNTER — Other Ambulatory Visit: Payer: Self-pay | Admitting: Cardiology

## 2022-08-25 DIAGNOSIS — I480 Paroxysmal atrial fibrillation: Secondary | ICD-10-CM

## 2022-08-26 DIAGNOSIS — D631 Anemia in chronic kidney disease: Secondary | ICD-10-CM | POA: Diagnosis not present

## 2022-08-26 DIAGNOSIS — I5033 Acute on chronic diastolic (congestive) heart failure: Secondary | ICD-10-CM | POA: Diagnosis not present

## 2022-08-26 DIAGNOSIS — E876 Hypokalemia: Secondary | ICD-10-CM | POA: Diagnosis not present

## 2022-08-26 DIAGNOSIS — J449 Chronic obstructive pulmonary disease, unspecified: Secondary | ICD-10-CM | POA: Diagnosis not present

## 2022-08-26 DIAGNOSIS — I480 Paroxysmal atrial fibrillation: Secondary | ICD-10-CM | POA: Diagnosis not present

## 2022-08-26 DIAGNOSIS — J9621 Acute and chronic respiratory failure with hypoxia: Secondary | ICD-10-CM | POA: Diagnosis not present

## 2022-08-26 DIAGNOSIS — N1831 Chronic kidney disease, stage 3a: Secondary | ICD-10-CM | POA: Diagnosis not present

## 2022-08-26 DIAGNOSIS — N179 Acute kidney failure, unspecified: Secondary | ICD-10-CM | POA: Diagnosis not present

## 2022-08-26 DIAGNOSIS — I130 Hypertensive heart and chronic kidney disease with heart failure and stage 1 through stage 4 chronic kidney disease, or unspecified chronic kidney disease: Secondary | ICD-10-CM | POA: Diagnosis not present

## 2022-08-26 NOTE — Telephone Encounter (Signed)
Eliquis 2.5mg  refill request received. Patient is 89 years old, weight-38kg, Crea-1.29 on 08/07/22, Diagnosis-Afib, and last seen by Dr. Jens Som on 06/26/22. Dose is appropriate based on dosing criteria. Will send in refill to requested pharmacy.

## 2022-08-28 DIAGNOSIS — I5033 Acute on chronic diastolic (congestive) heart failure: Secondary | ICD-10-CM | POA: Diagnosis not present

## 2022-08-28 DIAGNOSIS — J9621 Acute and chronic respiratory failure with hypoxia: Secondary | ICD-10-CM | POA: Diagnosis not present

## 2022-08-28 DIAGNOSIS — J449 Chronic obstructive pulmonary disease, unspecified: Secondary | ICD-10-CM | POA: Diagnosis not present

## 2022-08-28 DIAGNOSIS — I480 Paroxysmal atrial fibrillation: Secondary | ICD-10-CM | POA: Diagnosis not present

## 2022-08-28 DIAGNOSIS — E876 Hypokalemia: Secondary | ICD-10-CM | POA: Diagnosis not present

## 2022-08-28 DIAGNOSIS — N179 Acute kidney failure, unspecified: Secondary | ICD-10-CM | POA: Diagnosis not present

## 2022-08-28 DIAGNOSIS — N1831 Chronic kidney disease, stage 3a: Secondary | ICD-10-CM | POA: Diagnosis not present

## 2022-08-28 DIAGNOSIS — D631 Anemia in chronic kidney disease: Secondary | ICD-10-CM | POA: Diagnosis not present

## 2022-08-28 DIAGNOSIS — I130 Hypertensive heart and chronic kidney disease with heart failure and stage 1 through stage 4 chronic kidney disease, or unspecified chronic kidney disease: Secondary | ICD-10-CM | POA: Diagnosis not present

## 2022-08-31 DIAGNOSIS — E876 Hypokalemia: Secondary | ICD-10-CM | POA: Diagnosis not present

## 2022-08-31 DIAGNOSIS — N179 Acute kidney failure, unspecified: Secondary | ICD-10-CM | POA: Diagnosis not present

## 2022-08-31 DIAGNOSIS — I5033 Acute on chronic diastolic (congestive) heart failure: Secondary | ICD-10-CM | POA: Diagnosis not present

## 2022-08-31 DIAGNOSIS — I130 Hypertensive heart and chronic kidney disease with heart failure and stage 1 through stage 4 chronic kidney disease, or unspecified chronic kidney disease: Secondary | ICD-10-CM | POA: Diagnosis not present

## 2022-08-31 DIAGNOSIS — I480 Paroxysmal atrial fibrillation: Secondary | ICD-10-CM | POA: Diagnosis not present

## 2022-08-31 DIAGNOSIS — N1831 Chronic kidney disease, stage 3a: Secondary | ICD-10-CM | POA: Diagnosis not present

## 2022-08-31 DIAGNOSIS — J9621 Acute and chronic respiratory failure with hypoxia: Secondary | ICD-10-CM | POA: Diagnosis not present

## 2022-08-31 DIAGNOSIS — D631 Anemia in chronic kidney disease: Secondary | ICD-10-CM | POA: Diagnosis not present

## 2022-08-31 DIAGNOSIS — J449 Chronic obstructive pulmonary disease, unspecified: Secondary | ICD-10-CM | POA: Diagnosis not present

## 2022-09-02 DIAGNOSIS — I130 Hypertensive heart and chronic kidney disease with heart failure and stage 1 through stage 4 chronic kidney disease, or unspecified chronic kidney disease: Secondary | ICD-10-CM | POA: Diagnosis not present

## 2022-09-02 DIAGNOSIS — E876 Hypokalemia: Secondary | ICD-10-CM | POA: Diagnosis not present

## 2022-09-02 DIAGNOSIS — D631 Anemia in chronic kidney disease: Secondary | ICD-10-CM | POA: Diagnosis not present

## 2022-09-02 DIAGNOSIS — N1831 Chronic kidney disease, stage 3a: Secondary | ICD-10-CM | POA: Diagnosis not present

## 2022-09-02 DIAGNOSIS — I480 Paroxysmal atrial fibrillation: Secondary | ICD-10-CM | POA: Diagnosis not present

## 2022-09-02 DIAGNOSIS — J9621 Acute and chronic respiratory failure with hypoxia: Secondary | ICD-10-CM | POA: Diagnosis not present

## 2022-09-02 DIAGNOSIS — I5033 Acute on chronic diastolic (congestive) heart failure: Secondary | ICD-10-CM | POA: Diagnosis not present

## 2022-09-02 DIAGNOSIS — N179 Acute kidney failure, unspecified: Secondary | ICD-10-CM | POA: Diagnosis not present

## 2022-09-02 DIAGNOSIS — J449 Chronic obstructive pulmonary disease, unspecified: Secondary | ICD-10-CM | POA: Diagnosis not present

## 2022-09-03 DIAGNOSIS — J449 Chronic obstructive pulmonary disease, unspecified: Secondary | ICD-10-CM | POA: Diagnosis not present

## 2022-09-03 DIAGNOSIS — N1831 Chronic kidney disease, stage 3a: Secondary | ICD-10-CM | POA: Diagnosis not present

## 2022-09-03 DIAGNOSIS — J9621 Acute and chronic respiratory failure with hypoxia: Secondary | ICD-10-CM | POA: Diagnosis not present

## 2022-09-03 DIAGNOSIS — I5033 Acute on chronic diastolic (congestive) heart failure: Secondary | ICD-10-CM | POA: Diagnosis not present

## 2022-09-03 DIAGNOSIS — I480 Paroxysmal atrial fibrillation: Secondary | ICD-10-CM | POA: Diagnosis not present

## 2022-09-03 DIAGNOSIS — I130 Hypertensive heart and chronic kidney disease with heart failure and stage 1 through stage 4 chronic kidney disease, or unspecified chronic kidney disease: Secondary | ICD-10-CM | POA: Diagnosis not present

## 2022-09-03 DIAGNOSIS — N179 Acute kidney failure, unspecified: Secondary | ICD-10-CM | POA: Diagnosis not present

## 2022-09-03 DIAGNOSIS — D631 Anemia in chronic kidney disease: Secondary | ICD-10-CM | POA: Diagnosis not present

## 2022-09-03 DIAGNOSIS — E876 Hypokalemia: Secondary | ICD-10-CM | POA: Diagnosis not present

## 2022-09-04 DIAGNOSIS — N1831 Chronic kidney disease, stage 3a: Secondary | ICD-10-CM | POA: Diagnosis not present

## 2022-09-04 DIAGNOSIS — J449 Chronic obstructive pulmonary disease, unspecified: Secondary | ICD-10-CM | POA: Diagnosis not present

## 2022-09-04 DIAGNOSIS — I5033 Acute on chronic diastolic (congestive) heart failure: Secondary | ICD-10-CM | POA: Diagnosis not present

## 2022-09-04 DIAGNOSIS — E876 Hypokalemia: Secondary | ICD-10-CM | POA: Diagnosis not present

## 2022-09-04 DIAGNOSIS — I130 Hypertensive heart and chronic kidney disease with heart failure and stage 1 through stage 4 chronic kidney disease, or unspecified chronic kidney disease: Secondary | ICD-10-CM | POA: Diagnosis not present

## 2022-09-04 DIAGNOSIS — D631 Anemia in chronic kidney disease: Secondary | ICD-10-CM | POA: Diagnosis not present

## 2022-09-04 DIAGNOSIS — I480 Paroxysmal atrial fibrillation: Secondary | ICD-10-CM | POA: Diagnosis not present

## 2022-09-04 DIAGNOSIS — N179 Acute kidney failure, unspecified: Secondary | ICD-10-CM | POA: Diagnosis not present

## 2022-09-04 DIAGNOSIS — J9621 Acute and chronic respiratory failure with hypoxia: Secondary | ICD-10-CM | POA: Diagnosis not present

## 2022-09-10 DIAGNOSIS — N179 Acute kidney failure, unspecified: Secondary | ICD-10-CM | POA: Diagnosis not present

## 2022-09-10 DIAGNOSIS — J449 Chronic obstructive pulmonary disease, unspecified: Secondary | ICD-10-CM | POA: Diagnosis not present

## 2022-09-10 DIAGNOSIS — J9621 Acute and chronic respiratory failure with hypoxia: Secondary | ICD-10-CM | POA: Diagnosis not present

## 2022-09-10 DIAGNOSIS — N1831 Chronic kidney disease, stage 3a: Secondary | ICD-10-CM | POA: Diagnosis not present

## 2022-09-10 DIAGNOSIS — I130 Hypertensive heart and chronic kidney disease with heart failure and stage 1 through stage 4 chronic kidney disease, or unspecified chronic kidney disease: Secondary | ICD-10-CM | POA: Diagnosis not present

## 2022-09-10 DIAGNOSIS — E876 Hypokalemia: Secondary | ICD-10-CM | POA: Diagnosis not present

## 2022-09-10 DIAGNOSIS — I5033 Acute on chronic diastolic (congestive) heart failure: Secondary | ICD-10-CM | POA: Diagnosis not present

## 2022-09-10 DIAGNOSIS — D631 Anemia in chronic kidney disease: Secondary | ICD-10-CM | POA: Diagnosis not present

## 2022-09-10 DIAGNOSIS — I480 Paroxysmal atrial fibrillation: Secondary | ICD-10-CM | POA: Diagnosis not present

## 2022-09-11 DIAGNOSIS — J449 Chronic obstructive pulmonary disease, unspecified: Secondary | ICD-10-CM | POA: Diagnosis not present

## 2022-09-11 DIAGNOSIS — I130 Hypertensive heart and chronic kidney disease with heart failure and stage 1 through stage 4 chronic kidney disease, or unspecified chronic kidney disease: Secondary | ICD-10-CM | POA: Diagnosis not present

## 2022-09-11 DIAGNOSIS — I480 Paroxysmal atrial fibrillation: Secondary | ICD-10-CM | POA: Diagnosis not present

## 2022-09-11 DIAGNOSIS — N179 Acute kidney failure, unspecified: Secondary | ICD-10-CM | POA: Diagnosis not present

## 2022-09-11 DIAGNOSIS — E876 Hypokalemia: Secondary | ICD-10-CM | POA: Diagnosis not present

## 2022-09-11 DIAGNOSIS — I5033 Acute on chronic diastolic (congestive) heart failure: Secondary | ICD-10-CM | POA: Diagnosis not present

## 2022-09-11 DIAGNOSIS — D631 Anemia in chronic kidney disease: Secondary | ICD-10-CM | POA: Diagnosis not present

## 2022-09-11 DIAGNOSIS — N1831 Chronic kidney disease, stage 3a: Secondary | ICD-10-CM | POA: Diagnosis not present

## 2022-09-11 DIAGNOSIS — J9621 Acute and chronic respiratory failure with hypoxia: Secondary | ICD-10-CM | POA: Diagnosis not present

## 2022-09-12 DIAGNOSIS — N1831 Chronic kidney disease, stage 3a: Secondary | ICD-10-CM | POA: Diagnosis not present

## 2022-09-12 DIAGNOSIS — J9621 Acute and chronic respiratory failure with hypoxia: Secondary | ICD-10-CM | POA: Diagnosis not present

## 2022-09-12 DIAGNOSIS — E876 Hypokalemia: Secondary | ICD-10-CM | POA: Diagnosis not present

## 2022-09-12 DIAGNOSIS — I5033 Acute on chronic diastolic (congestive) heart failure: Secondary | ICD-10-CM | POA: Diagnosis not present

## 2022-09-12 DIAGNOSIS — I130 Hypertensive heart and chronic kidney disease with heart failure and stage 1 through stage 4 chronic kidney disease, or unspecified chronic kidney disease: Secondary | ICD-10-CM | POA: Diagnosis not present

## 2022-09-12 DIAGNOSIS — I480 Paroxysmal atrial fibrillation: Secondary | ICD-10-CM | POA: Diagnosis not present

## 2022-09-12 DIAGNOSIS — D631 Anemia in chronic kidney disease: Secondary | ICD-10-CM | POA: Diagnosis not present

## 2022-09-12 DIAGNOSIS — N179 Acute kidney failure, unspecified: Secondary | ICD-10-CM | POA: Diagnosis not present

## 2022-09-12 DIAGNOSIS — J449 Chronic obstructive pulmonary disease, unspecified: Secondary | ICD-10-CM | POA: Diagnosis not present

## 2022-09-16 DIAGNOSIS — J9621 Acute and chronic respiratory failure with hypoxia: Secondary | ICD-10-CM | POA: Diagnosis not present

## 2022-09-16 DIAGNOSIS — I5033 Acute on chronic diastolic (congestive) heart failure: Secondary | ICD-10-CM | POA: Diagnosis not present

## 2022-09-16 DIAGNOSIS — I480 Paroxysmal atrial fibrillation: Secondary | ICD-10-CM | POA: Diagnosis not present

## 2022-09-16 DIAGNOSIS — N179 Acute kidney failure, unspecified: Secondary | ICD-10-CM | POA: Diagnosis not present

## 2022-09-16 DIAGNOSIS — E876 Hypokalemia: Secondary | ICD-10-CM | POA: Diagnosis not present

## 2022-09-16 DIAGNOSIS — D631 Anemia in chronic kidney disease: Secondary | ICD-10-CM | POA: Diagnosis not present

## 2022-09-16 DIAGNOSIS — N1831 Chronic kidney disease, stage 3a: Secondary | ICD-10-CM | POA: Diagnosis not present

## 2022-09-16 DIAGNOSIS — J449 Chronic obstructive pulmonary disease, unspecified: Secondary | ICD-10-CM | POA: Diagnosis not present

## 2022-09-16 DIAGNOSIS — I130 Hypertensive heart and chronic kidney disease with heart failure and stage 1 through stage 4 chronic kidney disease, or unspecified chronic kidney disease: Secondary | ICD-10-CM | POA: Diagnosis not present

## 2022-09-23 DIAGNOSIS — J449 Chronic obstructive pulmonary disease, unspecified: Secondary | ICD-10-CM | POA: Diagnosis not present

## 2022-09-23 DIAGNOSIS — N179 Acute kidney failure, unspecified: Secondary | ICD-10-CM | POA: Diagnosis not present

## 2022-09-23 DIAGNOSIS — I5033 Acute on chronic diastolic (congestive) heart failure: Secondary | ICD-10-CM | POA: Diagnosis not present

## 2022-09-23 DIAGNOSIS — I130 Hypertensive heart and chronic kidney disease with heart failure and stage 1 through stage 4 chronic kidney disease, or unspecified chronic kidney disease: Secondary | ICD-10-CM | POA: Diagnosis not present

## 2022-09-23 DIAGNOSIS — J9621 Acute and chronic respiratory failure with hypoxia: Secondary | ICD-10-CM | POA: Diagnosis not present

## 2022-09-23 DIAGNOSIS — N1831 Chronic kidney disease, stage 3a: Secondary | ICD-10-CM | POA: Diagnosis not present

## 2022-09-23 DIAGNOSIS — E876 Hypokalemia: Secondary | ICD-10-CM | POA: Diagnosis not present

## 2022-09-23 DIAGNOSIS — I480 Paroxysmal atrial fibrillation: Secondary | ICD-10-CM | POA: Diagnosis not present

## 2022-09-23 DIAGNOSIS — D631 Anemia in chronic kidney disease: Secondary | ICD-10-CM | POA: Diagnosis not present

## 2022-09-24 DIAGNOSIS — N179 Acute kidney failure, unspecified: Secondary | ICD-10-CM | POA: Diagnosis not present

## 2022-09-24 DIAGNOSIS — J9621 Acute and chronic respiratory failure with hypoxia: Secondary | ICD-10-CM | POA: Diagnosis not present

## 2022-09-24 DIAGNOSIS — D631 Anemia in chronic kidney disease: Secondary | ICD-10-CM | POA: Diagnosis not present

## 2022-09-24 DIAGNOSIS — I5033 Acute on chronic diastolic (congestive) heart failure: Secondary | ICD-10-CM | POA: Diagnosis not present

## 2022-09-24 DIAGNOSIS — I130 Hypertensive heart and chronic kidney disease with heart failure and stage 1 through stage 4 chronic kidney disease, or unspecified chronic kidney disease: Secondary | ICD-10-CM | POA: Diagnosis not present

## 2022-09-24 DIAGNOSIS — J449 Chronic obstructive pulmonary disease, unspecified: Secondary | ICD-10-CM | POA: Diagnosis not present

## 2022-09-24 DIAGNOSIS — N1831 Chronic kidney disease, stage 3a: Secondary | ICD-10-CM | POA: Diagnosis not present

## 2022-09-24 DIAGNOSIS — E876 Hypokalemia: Secondary | ICD-10-CM | POA: Diagnosis not present

## 2022-09-24 DIAGNOSIS — I480 Paroxysmal atrial fibrillation: Secondary | ICD-10-CM | POA: Diagnosis not present

## 2022-09-26 NOTE — Progress Notes (Signed)
Cardiology Clinic Note   Date: 09/27/2022 ID: [name], DOB 04-May-1932, MRN [id number]  Primary Cardiologist:  Olga Millers, MD  Patient Profile    [name] is a 89 y.o. female who presents to the clinic today for hospital follow up.     Past medical history significant for: Coronary artery calcification. PAD. S/p left above-knee to below knee popliteal artery bypass April 2012. Critical limb ischemia: Endarterectomy left common femoral artery April 2023. Arterial ultrasound/ABI 07/19/2022: Patent left lower extremity with 50 to 74% stenosis.  Noncompressible bilateral lower extremity arteries. Chronic diastolic heart failure. Echo 06/14/2022: EF 50 to 55%.  Low normal RV function.  Mild MR.  Moderate TR. PAF/A-flutter. DCCV 02/15/2020. DCCV 08/07/2021. Hypertension. Hyperlipidemia. T2DM. DVT CVA.     History of Present Illness    [name] is a longtime patient of Dr. Jens Som followed for the above outlined history.  Patient has a remote history of stress test May 2007 which showed no ischemia.  Patient has a history of PAD followed by vascular surgery.  She underwent lower extremity angiography by Dr. Sherral Hammers in April 2023 due to critical limb ischemia which showed occlusion of the left common femoral artery with only profunda runoff with reconstitution of the posterior tibial artery distally.  She ultimately underwent left common femoral artery endarterectomy.  Following surgery she had left facial swelling and asymmetrical facial smile.  Code stroke was called but brain MRI was negative.  Hospitalization was further complicated by recurrent A-fib with RVR and altered mental status.  She was started on amiodarone and underwent TEE/DCCV.  Patient was last seen in the office by Dr. Jens Som on 06/26/2022 for hospital follow-up.  Patient had recently been discharged following admission for diastolic congestive heart failure and hypertensive urgency.  CTA showed  no PE but there was note of coronary calcification, pleural effusion bilaterally and mild edema.  Echo showed normal LV function with mild MR and moderate TR. she was diuresed with IV Lasix and discharged home on p.o. Lasix and supplemental O2.  At the time of office visit she reported improved dyspnea and only minimal edema.  She was in sinus rhythm at that time.  Patient presented to the ED on 08/03/2022 with complaints of shortness of breath.  Patient's daughter reported a 2-day history of worsening shortness of breath mainly with exertion and associated orthopnea and PND.  Weight reported as stable.  She was sent home with oxygen from her last hospital visit but had run out.  She also reported not taking diuretics for 2 to 3 days and had just recently got a refill from PCP. Chest xray showed moderate bilateral layering effusions with bilateral perihilar and lower lobe airspace opacities concerning for pulmonary edema. Patient was diuresed with IV lasix.  Repeat x-ray showed persistent pleural effusions.  She underwent thoracentesis on 08/06/2022 with removal of 500 mL from the left.  Patient was discharged on 08/07/2022.  It was recommended she be placed in SNF but family declined opting to return home with home health services.  Today, patient is accompanied by her daughter. She reports she was doing well until last night when she felt like her heart was racing. Patent's daughter felt she was not breathing well so she put her on O2. She states she stayed on 4 L O2 all night. Daughter did not check heart rate or O2. She states she did not seem better this morning so she had her on 3L until they left  for this appointment. Patient was short of breath as she was walking to exam. CMA had patient stop to sit in a chair and rest before walking the rest of the way to the exam room. She was not on oxygen. O2 sat 84%. Patient was started on 2L O2 and O2 sat increased to 93%. Per daughter patient is typically in the 90s.  Patient states she does not feel short of breath. She did feel her heart racing last night but is unsure if it was irregular. She denies lower extremity edema, orthopnea or PND. She reports brisk diuresis on Lasix.     ROS: All other systems reviewed and are otherwise negative except as noted in History of Present Illness.  Studies Reviewed    ECG personally reviewed by me today: NSR, 74 bpm.  No significant changes from 06/12/2022.  Risk Assessment/Calculations     CHA2DS2-VASc Score = 8   This indicates a 10.8% annual risk of stroke. The patient's score is based upon: CHF History: 1 HTN History: 1 Diabetes History: 1 Stroke History: 2 Vascular Disease History: 0 Age Score: 2 Gender Score: 1             Physical Exam    VS:  BP 138/60 (BP Location: Left Arm, Patient Position: Sitting, Cuff Size: Normal)   Pulse 74   Ht 5' (1.524 m)   Wt 95 lb 9.6 oz (43.4 kg) Comment: shoes and jean jacket  SpO2 93% Comment: on 2L  BMI 18.67 kg/m  , BMI Body mass index is 18.67 kg/m.  GEN: Well nourished, frail, in no acute distress. Neck: No JVD or carotid bruits. Cardiac: RRR. No murmurs. No rubs or gallops.   Respiratory:  Respirations regular and unlabored. Clear to auscultation without rales, wheezing or rhonchi. GI: Soft, nontender, nondistended. Extremities: Radials/DP/PT 2+ and equal bilaterally. No clubbing or cyanosis. No edema.  Skin: Warm and dry, no rash. Neuro: Strength intact.  Assessment & Plan    Chronic diastolic heart failure/dyspnea.  Patient with 2 recent hospital visits.  In March she presented to the ED with acute on chronic congestive heart failure and hypertensive crisis.  She received IV diuresis prior to discharge on p.o. Lasix and supplemental O2.  She underwent hospital admission on 08/03/2022 to 08/07/2022 she was diuresed with IV Lasix and underwent thoracentesis for pleural effusion.  Patient's daughter reports she has done well since discharge until  last night when patient felt her heart was racing. Daughter felt she was not breathing well so she put her on 4L of supplemental O2 all night. Daughter did not check pulse ox or heart rate. Patient became dyspneic on her way to exam room and had to stop halfway to rest. Oxygen was 84% when she reached the room. She was placed on 2L of O2 and oxygen level came up to 93% which daughter reports that is usually where she is at home. Patient denies feeling short of breath. No lower extremity edema, abdominal bloating/fullness, orthopnea or PND. Her heart is no longer racing. She has no lower extremity edema on exam. Clear breath sounds bilaterally without wheezing, rhonchi, rales.  Continue carvedilol, Entresto, furosemide. Will refer to pulmonology for further evaluation.  PAF/A-flutter.  DCCV in 2021 and 2023.  Patient reports heart racing last night but is not sure if it was irregular. EKG shows NSR, 74 bpm. Denies spontaneous bleeding concerns.  Continue carvedilol, amiodarone, Eliquis. Appropriate Eliquis dose.   Hypertension. BP today 138/60.  Patient denies headaches,  dizziness or vision changes. Continue amlodipine, carvedilol, Entresto.  Disposition: CBC, BMP today. Refer to pulmonology. Return for previously scheduled visit in one month with Dr. Jens Som or sooner as needed.          Signed, Etta Grandchild. Seville Brick, DNP, NP-C

## 2022-09-27 ENCOUNTER — Encounter: Payer: Self-pay | Admitting: Student

## 2022-09-27 ENCOUNTER — Ambulatory Visit: Payer: Medicare HMO | Attending: Student | Admitting: Student

## 2022-09-27 VITALS — BP 138/60 | HR 74 | Ht 60.0 in | Wt 95.6 lb

## 2022-09-27 DIAGNOSIS — I10 Essential (primary) hypertension: Secondary | ICD-10-CM | POA: Diagnosis not present

## 2022-09-27 DIAGNOSIS — I5032 Chronic diastolic (congestive) heart failure: Secondary | ICD-10-CM

## 2022-09-27 DIAGNOSIS — R0609 Other forms of dyspnea: Secondary | ICD-10-CM | POA: Diagnosis not present

## 2022-09-27 DIAGNOSIS — I480 Paroxysmal atrial fibrillation: Secondary | ICD-10-CM | POA: Diagnosis not present

## 2022-09-27 DIAGNOSIS — J90 Pleural effusion, not elsewhere classified: Secondary | ICD-10-CM | POA: Diagnosis not present

## 2022-09-27 NOTE — Patient Instructions (Signed)
Medication Instructions:  NO CHANGES  *If you need a refill on your cardiac medications before your next appointment, please call your pharmacy*   Lab Work: CBC and BMET today   If you have labs (blood work) drawn today and your tests are completely normal, you will receive your results only by: MyChart Message (if you have MyChart) OR A paper copy in the mail If you have any lab test that is abnormal or we need to change your treatment, we will call you to review the results.   Testing/Procedures: NONE   Follow-Up: At [facility], you and your health needs are our priority.  As part of our continuing mission to provide you with exceptional heart care, we have created designated Provider Care Teams.  These Care Teams include your primary Cardiologist (physician) and Advanced Practice Providers (APPs -  Physician Assistants and Nurse Practitioners) who all work together to provide you with the care you need, when you need it.  We recommend signing up for the patient portal called "MyChart".  Sign up information is provided on this After Visit Summary.  MyChart is used to connect with patients for Virtual Visits (Telemedicine).  Patients are able to view lab/test results, encounter notes, upcoming appointments, etc.  Non-urgent messages can be sent to your provider as well.   To learn more about what you can do with MyChart, go to [email or web address].    Your next appointment:     October 28, 2022 @ 11:20am with Dr. Jens Som   Other Instructions You have been referred to [facility] Pulmonary. You will get a call to schedule an appointment.

## 2022-09-28 ENCOUNTER — Emergency Department (HOSPITAL_COMMUNITY): Payer: Medicare HMO

## 2022-09-28 ENCOUNTER — Other Ambulatory Visit: Payer: Self-pay

## 2022-09-28 ENCOUNTER — Inpatient Hospital Stay (HOSPITAL_COMMUNITY)
Admission: EM | Admit: 2022-09-28 | Discharge: 2022-10-14 | DRG: 871 | Disposition: E | Payer: Medicare HMO | Attending: Pulmonary Disease | Admitting: Pulmonary Disease

## 2022-09-28 DIAGNOSIS — R001 Bradycardia, unspecified: Secondary | ICD-10-CM | POA: Diagnosis not present

## 2022-09-28 DIAGNOSIS — E872 Acidosis, unspecified: Secondary | ICD-10-CM | POA: Diagnosis not present

## 2022-09-28 DIAGNOSIS — J189 Pneumonia, unspecified organism: Secondary | ICD-10-CM | POA: Diagnosis present

## 2022-09-28 DIAGNOSIS — I959 Hypotension, unspecified: Secondary | ICD-10-CM

## 2022-09-28 DIAGNOSIS — R571 Hypovolemic shock: Secondary | ICD-10-CM | POA: Diagnosis present

## 2022-09-28 DIAGNOSIS — E1151 Type 2 diabetes mellitus with diabetic peripheral angiopathy without gangrene: Secondary | ICD-10-CM | POA: Diagnosis present

## 2022-09-28 DIAGNOSIS — A419 Sepsis, unspecified organism: Secondary | ICD-10-CM | POA: Diagnosis not present

## 2022-09-28 DIAGNOSIS — I5033 Acute on chronic diastolic (congestive) heart failure: Secondary | ICD-10-CM | POA: Diagnosis present

## 2022-09-28 DIAGNOSIS — R570 Cardiogenic shock: Secondary | ICD-10-CM | POA: Diagnosis present

## 2022-09-28 DIAGNOSIS — R579 Shock, unspecified: Secondary | ICD-10-CM | POA: Diagnosis not present

## 2022-09-28 DIAGNOSIS — I4891 Unspecified atrial fibrillation: Secondary | ICD-10-CM | POA: Diagnosis not present

## 2022-09-28 DIAGNOSIS — Z7984 Long term (current) use of oral hypoglycemic drugs: Secondary | ICD-10-CM

## 2022-09-28 DIAGNOSIS — N179 Acute kidney failure, unspecified: Secondary | ICD-10-CM | POA: Diagnosis not present

## 2022-09-28 DIAGNOSIS — D649 Anemia, unspecified: Secondary | ICD-10-CM | POA: Diagnosis present

## 2022-09-28 DIAGNOSIS — R680 Hypothermia, not associated with low environmental temperature: Secondary | ICD-10-CM | POA: Diagnosis present

## 2022-09-28 DIAGNOSIS — R6521 Severe sepsis with septic shock: Secondary | ICD-10-CM | POA: Diagnosis not present

## 2022-09-28 DIAGNOSIS — T68XXXA Hypothermia, initial encounter: Secondary | ICD-10-CM

## 2022-09-28 DIAGNOSIS — R531 Weakness: Secondary | ICD-10-CM | POA: Diagnosis not present

## 2022-09-28 DIAGNOSIS — R0902 Hypoxemia: Secondary | ICD-10-CM | POA: Diagnosis not present

## 2022-09-28 DIAGNOSIS — G9341 Metabolic encephalopathy: Secondary | ICD-10-CM | POA: Diagnosis not present

## 2022-09-28 DIAGNOSIS — E785 Hyperlipidemia, unspecified: Secondary | ICD-10-CM | POA: Diagnosis present

## 2022-09-28 DIAGNOSIS — Z7901 Long term (current) use of anticoagulants: Secondary | ICD-10-CM | POA: Diagnosis not present

## 2022-09-28 DIAGNOSIS — Z794 Long term (current) use of insulin: Secondary | ICD-10-CM | POA: Diagnosis not present

## 2022-09-28 DIAGNOSIS — R54 Age-related physical debility: Secondary | ICD-10-CM | POA: Diagnosis present

## 2022-09-28 DIAGNOSIS — I469 Cardiac arrest, cause unspecified: Secondary | ICD-10-CM | POA: Diagnosis not present

## 2022-09-28 DIAGNOSIS — Z8249 Family history of ischemic heart disease and other diseases of the circulatory system: Secondary | ICD-10-CM

## 2022-09-28 DIAGNOSIS — E861 Hypovolemia: Secondary | ICD-10-CM | POA: Diagnosis present

## 2022-09-28 DIAGNOSIS — I110 Hypertensive heart disease with heart failure: Secondary | ICD-10-CM | POA: Diagnosis present

## 2022-09-28 DIAGNOSIS — J90 Pleural effusion, not elsewhere classified: Secondary | ICD-10-CM | POA: Diagnosis not present

## 2022-09-28 DIAGNOSIS — E1165 Type 2 diabetes mellitus with hyperglycemia: Secondary | ICD-10-CM | POA: Diagnosis present

## 2022-09-28 DIAGNOSIS — J9601 Acute respiratory failure with hypoxia: Secondary | ICD-10-CM | POA: Diagnosis present

## 2022-09-28 DIAGNOSIS — I6381 Other cerebral infarction due to occlusion or stenosis of small artery: Secondary | ICD-10-CM | POA: Diagnosis not present

## 2022-09-28 DIAGNOSIS — R739 Hyperglycemia, unspecified: Secondary | ICD-10-CM | POA: Diagnosis not present

## 2022-09-28 DIAGNOSIS — Z8673 Personal history of transient ischemic attack (TIA), and cerebral infarction without residual deficits: Secondary | ICD-10-CM

## 2022-09-28 DIAGNOSIS — Z79899 Other long term (current) drug therapy: Secondary | ICD-10-CM

## 2022-09-28 DIAGNOSIS — R4182 Altered mental status, unspecified: Secondary | ICD-10-CM | POA: Diagnosis not present

## 2022-09-28 LAB — COMPREHENSIVE METABOLIC PANEL
ALT: 193 U/L — ABNORMAL HIGH (ref 0–44)
AST: 267 U/L — ABNORMAL HIGH (ref 15–41)
Albumin: 3.2 g/dL — ABNORMAL LOW (ref 3.5–5.0)
Alkaline Phosphatase: 110 U/L (ref 38–126)
Anion gap: 20 — ABNORMAL HIGH (ref 5–15)
BUN: 44 mg/dL — ABNORMAL HIGH (ref 8–23)
CO2: 12 mmol/L — ABNORMAL LOW (ref 22–32)
Calcium: 8.7 mg/dL — ABNORMAL LOW (ref 8.9–10.3)
Chloride: 103 mmol/L (ref 98–111)
Creatinine, Ser: 2.04 mg/dL — ABNORMAL HIGH (ref 0.44–1.00)
GFR, Estimated: 23 mL/min — ABNORMAL LOW (ref >60)
Glucose, Bld: 461 mg/dL — ABNORMAL HIGH (ref 70–99)
Potassium: 5.1 mmol/L (ref 3.5–5.1)
Sodium: 135 mmol/L (ref 135–145)
Total Bilirubin: 1.5 mg/dL — ABNORMAL HIGH (ref 0.3–1.2)
Total Protein: 6.2 g/dL — ABNORMAL LOW (ref 6.5–8.1)

## 2022-09-28 LAB — CBC WITH DIFFERENTIAL/PLATELET
Abs Immature Granulocytes: 0.03 10*3/uL (ref 0.00–0.07)
Basophils Absolute: 0 10*3/uL (ref 0.0–0.1)
Basophils Relative: 0 %
Eosinophils Absolute: 0 10*3/uL (ref 0.0–0.5)
Eosinophils Relative: 0 %
HCT: 22.5 % — ABNORMAL LOW (ref 36.0–46.0)
Hemoglobin: 6.5 g/dL — CL (ref 12.0–15.0)
Immature Granulocytes: 1 %
Lymphocytes Relative: 8 %
Lymphs Abs: 0.5 10*3/uL — ABNORMAL LOW (ref 0.7–4.0)
MCH: 30.7 pg (ref 26.0–34.0)
MCHC: 28.9 g/dL — ABNORMAL LOW (ref 30.0–36.0)
MCV: 106.1 fL — ABNORMAL HIGH (ref 80.0–100.0)
Monocytes Absolute: 0.4 10*3/uL (ref 0.1–1.0)
Monocytes Relative: 7 %
Neutro Abs: 5 10*3/uL (ref 1.7–7.7)
Neutrophils Relative %: 84 %
Platelets: 167 10*3/uL (ref 150–400)
RBC: 2.12 MIL/uL — ABNORMAL LOW (ref 3.87–5.11)
RDW: 18.3 % — ABNORMAL HIGH (ref 11.5–15.5)
WBC: 6 10*3/uL (ref 4.0–10.5)
nRBC: 0.3 % — ABNORMAL HIGH (ref 0.0–0.2)

## 2022-09-28 LAB — URINALYSIS, W/ REFLEX TO CULTURE (INFECTION SUSPECTED)
Bilirubin Urine: NEGATIVE
Glucose, UA: >=500 mg/dL — AB
Hgb urine dipstick: NEGATIVE
Ketones, ur: NEGATIVE mg/dL
Leukocytes,Ua: NEGATIVE
Nitrite: NEGATIVE
Protein, ur: 100 mg/dL — AB
Specific Gravity, Urine: 1.016 (ref 1.005–1.030)
pH: 5 (ref 5.0–8.0)

## 2022-09-28 LAB — PROTIME-INR
INR: 1.9 — ABNORMAL HIGH (ref 0.8–1.2)
Prothrombin Time: 21.7 seconds — ABNORMAL HIGH (ref 11.4–15.2)

## 2022-09-28 LAB — TYPE AND SCREEN
ABO/RH(D): O POS
Antibody Screen: NEGATIVE
Unit division: 0

## 2022-09-28 LAB — CBC
Hematocrit: 25.7 % — ABNORMAL LOW (ref 34.0–46.6)
Hemoglobin: 7.9 g/dL — ABNORMAL LOW (ref 11.1–15.9)
MCH: 30 pg (ref 26.6–33.0)
MCHC: 30.7 g/dL — ABNORMAL LOW (ref 31.5–35.7)
MCV: 98 fL — ABNORMAL HIGH (ref 79–97)
Platelets: 226 10*3/uL (ref 150–450)
RBC: 2.63 x10E6/uL — CL (ref 3.77–5.28)
RDW: 14.6 % (ref 11.7–15.4)
WBC: 6.8 10*3/uL (ref 3.4–10.8)

## 2022-09-28 LAB — BRAIN NATRIURETIC PEPTIDE: B Natriuretic Peptide: 1431.6 pg/mL — ABNORMAL HIGH (ref 0.0–100.0)

## 2022-09-28 LAB — BASIC METABOLIC PANEL
BUN/Creatinine Ratio: 24 (ref 12–28)
BUN: 26 mg/dL (ref 8–27)
CO2: 20 mmol/L (ref 20–29)
Calcium: 9.7 mg/dL (ref 8.7–10.3)
Chloride: 100 mmol/L (ref 96–106)
Creatinine, Ser: 1.07 mg/dL — ABNORMAL HIGH (ref 0.57–1.00)
Glucose: 425 mg/dL — ABNORMAL HIGH (ref 70–99)
Potassium: 4.9 mmol/L (ref 3.5–5.2)
Sodium: 138 mmol/L (ref 134–144)
eGFR: 50 mL/min/{1.73_m2} — ABNORMAL LOW (ref >59)

## 2022-09-28 LAB — TROPONIN I (HIGH SENSITIVITY): Troponin I (High Sensitivity): 22 ng/L — ABNORMAL HIGH (ref <18)

## 2022-09-28 LAB — LACTIC ACID, PLASMA
Lactic Acid, Venous: >9 mmol/L (ref 0.5–1.9)
Lactic Acid, Venous: 2.2 mmol/L (ref 0.5–1.9)

## 2022-09-28 LAB — BPAM RBC
Blood Product Expiration Date: 202407232359
ISSUE DATE / TIME: 202406151609
Unit Type and Rh: 5100

## 2022-09-28 LAB — AMMONIA: Ammonia: 25 umol/L (ref 9–35)

## 2022-09-28 LAB — PREPARE RBC (CROSSMATCH)

## 2022-09-28 LAB — APTT: aPTT: 35 seconds (ref 24–36)

## 2022-09-28 MED ORDER — ORAL CARE MOUTH RINSE: 15.0000 mL | OROMUCOSAL | Status: DC

## 2022-09-28 MED ORDER — SODIUM BICARBONATE 8.4 % IV SOLN
INTRAVENOUS | Status: AC | PRN
  Administered 2022-09-28: 50 meq via INTRAVENOUS

## 2022-09-28 MED ORDER — ETOMIDATE 2 MG/ML IV SOLN
INTRAVENOUS | Status: AC | PRN
  Administered 2022-09-28: 20 mg via INTRAVENOUS

## 2022-09-28 MED ORDER — SODIUM CHLORIDE 0.9 % IV SOLN: 250.0000 mL | INTRAVENOUS | Status: DC

## 2022-09-28 MED ORDER — SODIUM CHLORIDE 0.9 % IV SOLN: 250.0000 mL | INTRAVENOUS | Status: DC | PRN

## 2022-09-28 MED ORDER — SODIUM CHLORIDE 0.9 % IV BOLUS
1000.0000 mL | Freq: Once | INTRAVENOUS | Status: AC
  Administered 2022-09-28: 1000 mL via INTRAVENOUS

## 2022-09-28 MED ORDER — SODIUM CHLORIDE 0.9 % IV SOLN: 1.0000 g | INTRAVENOUS | Status: DC

## 2022-09-28 MED ORDER — ETOMIDATE 2 MG/ML IV SOLN
INTRAVENOUS | Status: AC
  Filled 2022-09-28: qty 20

## 2022-09-28 MED ORDER — SODIUM CHLORIDE 0.9 % IV BOLUS (SEPSIS): 1000.0000 mL | Freq: Once | INTRAVENOUS | Status: DC

## 2022-09-28 MED ORDER — SODIUM CHLORIDE 0.9% FLUSH: 3.0000 mL | INTRAVENOUS | Status: DC | PRN

## 2022-09-28 MED ORDER — VANCOMYCIN HCL IN DEXTROSE 1-5 GM/200ML-% IV SOLN
1000.0000 mg | Freq: Once | INTRAVENOUS | Status: AC
  Administered 2022-09-28: 1000 mg via INTRAVENOUS
  Filled 2022-09-28: qty 200

## 2022-09-28 MED ORDER — SODIUM CHLORIDE 0.9% IV SOLUTION: Freq: Once | INTRAVENOUS | Status: DC

## 2022-09-28 MED ORDER — SODIUM CHLORIDE 0.9 % IV SOLN
500.0000 mg | Freq: Once | INTRAVENOUS | Status: AC
  Administered 2022-09-28: 500 mg via INTRAVENOUS
  Filled 2022-09-28: qty 5

## 2022-09-28 MED ORDER — ORAL CARE MOUTH RINSE: 15.0000 mL | OROMUCOSAL | Status: DC | PRN

## 2022-09-28 MED ORDER — PANTOPRAZOLE SODIUM 40 MG IV SOLR: 40.0000 mg | Freq: Two times a day (BID) | INTRAVENOUS | Status: DC

## 2022-09-28 MED ORDER — SODIUM CHLORIDE 0.9 % IV SOLN: 500.0000 mg | INTRAVENOUS | Status: DC

## 2022-09-28 MED ORDER — NOREPINEPHRINE 4 MG/250ML-% IV SOLN
2.0000 ug/min | INTRAVENOUS | Status: DC
  Filled 2022-09-28: qty 250

## 2022-09-28 MED ORDER — SODIUM CHLORIDE 0.9% FLUSH: 3.0000 mL | Freq: Two times a day (BID) | INTRAVENOUS | Status: DC

## 2022-09-28 MED ORDER — DEXTROSE 50 % IV SOLN: 0.0000 mL | INTRAVENOUS | Status: DC | PRN

## 2022-09-28 MED ORDER — EPINEPHRINE 1 MG/10ML IJ SOSY
PREFILLED_SYRINGE | INTRAMUSCULAR | Status: AC | PRN
  Administered 2022-09-28 (×3): 1 mg via INTRAVENOUS

## 2022-09-28 MED ORDER — LACTATED RINGERS IV SOLN
INTRAVENOUS | Status: DC
Start: 2022-09-28 — End: 2022-09-28

## 2022-09-28 MED ORDER — ROCURONIUM BROMIDE 10 MG/ML (PF) SYRINGE
PREFILLED_SYRINGE | INTRAVENOUS | Status: AC
  Filled 2022-09-28: qty 10

## 2022-09-28 MED ORDER — SODIUM CHLORIDE 0.9 % IV SOLN: INTRAVENOUS | Status: DC

## 2022-09-28 MED ORDER — CHLORHEXIDINE GLUCONATE CLOTH 2 % EX PADS: 6.0000 | MEDICATED_PAD | Freq: Every day | CUTANEOUS | Status: DC

## 2022-09-28 MED ORDER — LATANOPROST 0.005 % OP SOLN
1.0000 [drp] | Freq: Every day | OPHTHALMIC | Status: DC
  Filled 2022-09-28: qty 2.5

## 2022-09-28 MED ORDER — ROCURONIUM BROMIDE 50 MG/5ML IV SOLN
INTRAVENOUS | Status: AC | PRN
  Administered 2022-09-28: 60 mg via INTRAVENOUS

## 2022-09-28 MED ORDER — EPINEPHRINE 1 MG/10ML IJ SOSY
PREFILLED_SYRINGE | INTRAMUSCULAR | Status: AC | PRN
  Administered 2022-09-28: 1 mg via INTRAVENOUS

## 2022-09-28 MED ORDER — INSULIN REGULAR(HUMAN) IN NACL 100-0.9 UT/100ML-% IV SOLN
INTRAVENOUS | Status: DC
  Filled 2022-09-28: qty 100

## 2022-09-28 MED ORDER — DEXTROSE IN LACTATED RINGERS 5 % IV SOLN: INTRAVENOUS | Status: DC

## 2022-09-28 MED ORDER — SODIUM CHLORIDE 0.9 % IV SOLN
1.0000 g | Freq: Once | INTRAVENOUS | Status: AC
  Administered 2022-09-28: 1 g via INTRAVENOUS
  Filled 2022-09-28: qty 10

## 2022-09-28 MED FILL — Medication: Qty: 1 | Status: AC

## 2022-09-29 LAB — CULTURE, BLOOD (ROUTINE X 2)

## 2022-09-30 LAB — CULTURE, BLOOD (ROUTINE X 2)

## 2022-09-30 NOTE — Addendum Note (Signed)
Addended by: Lance Huar[place], James Senn C on: 09/30/2022 04:06 PM   Modules accepted: Orders

## 2022-10-01 LAB — CULTURE, BLOOD (ROUTINE X 2)
Culture: NO GROWTH
Culture: NO GROWTH

## 2022-10-01 LAB — CBG MONITORING, ED: Glucose-Capillary: 416 mg/dL — ABNORMAL HIGH (ref 70–99)

## 2022-10-02 LAB — CULTURE, BLOOD (ROUTINE X 2): Special Requests: ADEQUATE

## 2022-10-03 LAB — CULTURE, BLOOD (ROUTINE X 2)

## 2022-10-14 NOTE — ED Notes (Signed)
CBG 425. RN Tia aware.

## 2022-10-14 NOTE — Death Summary Note (Signed)
Death Summary   Name: [name] MRN: [id number] Admit date: 10-Oct-2022 Date of birth: 12-24-1932 Date of death: 10-Oct-2022 Time of death: 4:36 pm  Cause of death: Septic shock from community acquired pneumonia  Summary: 89 yo female brought to ER from home with hypotension and hyperglycemia. She was vomiting at home and unable to take her medications. Family noted she was more confused. SpO2 70% on room air, CBG was 514 and BP 92/40. Started on ABx and IV fluids in ER for presumed sepsis with pneumonia. CXR showed recurrent pulmonary edema and pleural effusions. Hb 6.5, Creatinine 2.04, lactic acid > 9. PCCM consulted to admit to ICU.  While still in the ER she developed cardiac arrest.  She had multiple rounds of CPR with epinephrine, and she was intubated.  These efforts were ultimately unsuccessful and she was pronounced dead in the emergency room.   Final diagnoses: Shock with lactic acidosis. Septic shock from pneumonia. Severe anemia with hypovolemic shock. Cardiogenic shock. Acute on chronic HFpEF. Bradycardia. Hx of atrial fibrillation on eliquis, peripheral artery disease, HLD, HTN. Acute hypoxic respiratory failure from acute pulmonary edema, community acquired pneumonia, and pleural effusions. DM type 2 poorly controlled with hyperglycemia. AKI in setting of shock. Anion gap metabolic acidosis. Elevated LFTs from shock. Acute metabolic encephalopathy from sepsis, renal failure. PEA leading to asystolic cardiac arrest.  Coralyn Helling, MD [facility] Pulmonary/Critical Care Oct 10, 2022, 4:46 PM

## 2022-10-14 NOTE — Progress Notes (Signed)
Pharmacy Note   A consult was received from an ED physician for vancomycin per pharmacy dosing.    The patient's profile has been reviewed for ht/wt/allergies/indication/available labs.    A one time order has been placed for vancomycin 1000 mg IV x1 .    Further antibiotics/pharmacy consults should be ordered by admitting physician if indicated.                       Thank you,  Adalberto Cole, PharmD, BCPS 09/18/2022 2:17 PM

## 2022-10-14 NOTE — ED Notes (Signed)
Spoke with ICU intesivist about new orders he intended to place for pt.

## 2022-10-14 NOTE — H&P (Signed)
NAME:  [name], MRN:  [id number], DOB:  06-03-1932, LOS: 0 ADMISSION DATE:  09/26/2022, CONSULTATION DATE:  09/30/2022 REFERRING MD:  Dr. [name], ER, CHIEF COMPLAINT:  Hyperglycemia   History of Present Illness:  89 yo female brought to ER from home with hypotension and hyperglycemia.  She was vomiting at home and unable to take her medications.  Family noted she was more confused.  SpO2 70% on room air, CBG was 514 and BP 92/40.  Started on ABx and IV fluids in ER for presumed sepsis with pneumonia.  CXR showed recurrent pulmonary edema and pleural effusions.  Hb 6.5, Creatinine 2.04, lactic acid > 9.  PCCM consulted to admit to ICU.  Pertinent  Medical History  A fib on eliquis, DM, DVT, HLD, HTN, PAD, CVA, Arthritis, HFpEF, Pleural effusions, Nocturnal hypoxemia  Significant Hospital Events: Including procedures, antibiotic start and stop dates in addition to other pertinent events   6/15 Admit; start ABx, pressors, insulin gtt, transfuse PRBC   Interim History / Subjective:  Hx from chart, medical team, and pt's daughter.  Objective   Blood pressure (!) 96/57, pulse (!) 54, temperature (!) 94.8 F (34.9 C), temperature source Rectal, resp. rate 20, height 5' (1.524 m), weight 44 kg, SpO2 97 %.       No intake or output data in the 24 hours ending 10/06/2022 1512 Filed Weights   09/29/2022 1337  Weight: 44 kg    Examination:  General - obtunded, frail appearing, diaphoretic, warming blanket on Eyes - pupils dilated, weakly reactive ENT - dry mucosa Cardiac - bradycardic Chest - poor air movement, bilateral crackles Abdomen - soft, protuberant, decreased bowel sounds Extremities - decreased muscle bulk, lower extremities cool Skin - no rashes Neuro - no response to stimuli  Resolved Hospital Problem list     Assessment & Plan:   Shock with lactic acidosis. - likely from hypovolemia with severe anemia, septic from pneumonia, and cardiogenic - peripheral pressors  to keep MAP > 65 - follow up lactic acid  Septic shock from pneumonia. - day 1 of ABx - follow up blood culture, urine legionella/pneumococcal Ag - follow up chest xray  Severe anemia with hypovolemic shock. - unclear whether she has GI bleeding at this time - protonix q12h IV - transfuse 1 unit PRBC - follow up hemoglobin  Cardiogenic shock. Acute on chronic HFpEF. Bradycardia. Hx of atrial fibrillation on eliquis, peripheral artery disease, HLD, HTN. - start levophed - hold outpt amiodarone, norvasc, lipitor, coreg, eliquis, entresto, zetia, lasix - follow up cardiac enzymes  Acute hypoxic respiratory failure from acute pulmonary edema, community acquired pneumonia, and pleural effusions. - follow up ABG, CXR - oxygen to keep SpO2 > 90% - might need intubation  DM type 2 poorly controlled with hyperglycemia. - insulin gtt - follow up beta hydroxybutyrate  - hold outpt glucotrol, lantus, metformin  AKI in setting of shock. Anion gap metabolic acidosis. - baseline creatinine 1.07 from 09/27/22 - follow up BMET, monitor urine outpt  Elevated LFTs from shock. - follow up LFTs  Acute metabolic encephalopathy from sepsis, renal failure. - monitor mental status  Best Practice (right click and "Reselect all SmartList Selections" daily)   Diet/type: NPO DVT prophylaxis: SCD GI prophylaxis: PPI Lines: N/A Foley:  Yes, and it is still needed Code Status:  full code Last date of multidisciplinary goals of care discussion [updated pt's daughter by phone.  She requested all measures be done to save her mother's life]  Labs   CBC: Recent Labs  Lab 09/27/22 1032 09/30/2022 1400  WBC 6.8 6.0  NEUTROABS  --  5.0  HGB 7.9* 6.5*  HCT 25.7* 22.5*  MCV 98* 106.1*  PLT 226 167    Basic Metabolic Panel: Recent Labs  Lab 09/27/22 1032 09/25/2022 1400  NA 138 135  K 4.9 5.1  CL 100 103  CO2 20 12*  GLUCOSE 425* 461*  BUN 26 44*  CREATININE 1.07* 2.04*  CALCIUM 9.7  8.7*   GFR: Estimated Creatinine Clearance: 13.5 mL/min (A) (by C-G formula based on SCr of 2.04 mg/dL (H)). Recent Labs  Lab 09/27/22 1032 09/18/2022 1400  WBC 6.8 6.0  LATICACIDVEN  --  >9.0*    Liver Function Tests: Recent Labs  Lab 09/25/2022 1400  AST 267*  ALT 193*  ALKPHOS 110  BILITOT 1.5*  PROT 6.2*  ALBUMIN 3.2*   No results for input(s): "LIPASE", "AMYLASE" in the last 168 hours. Recent Labs  Lab 10/04/2022 1400  AMMONIA 25    ABG    Component Value Date/Time   HCO3 22.4 08/03/2022 1720   TCO2 30 07/11/2021 0716   ACIDBASEDEF 1.1 08/03/2022 1720   O2SAT 36.8 08/03/2022 1720     Coagulation Profile: Recent Labs  Lab 09/29/2022 1400  INR 1.9*    Cardiac Enzymes: No results for input(s): "CKTOTAL", "CKMB", "CKMBINDEX", "TROPONINI" in the last 168 hours.  HbA1C: Hgb A1c MFr Bld  Date/Time Value Ref Range Status  06/13/2022 04:48 AM 7.6 (H) 4.8 - 5.6 % Final    Comment:    (NOTE)         Prediabetes: 5.7 - 6.4         Diabetes: >6.4         Glycemic control for adults with diabetes: <7.0   07/24/2021 04:00 PM 7.3 (H) 4.8 - 5.6 % Final    Comment:    (NOTE) Pre diabetes:          5.7%-6.4%  Diabetes:              >6.4%  Glycemic control for   <7.0% adults with diabetes     CBG: No results for input(s): "GLUCAP" in the last 168 hours.  Review of Systems:   Unable to obtain  Past Medical History:  She,  has a past medical history of Arthritis, Atrial fibrillation (HCC), Diabetes mellitus, DVT (deep venous thrombosis) (HCC), Hyperlipidemia, Hypertension, Leg pain, Peripheral arterial disease (HCC), and Stroke (HCC) (1999).   Surgical History:   Past Surgical History:  Procedure Laterality Date   ABDOMINAL AORTOGRAM W/LOWER EXTREMITY N/A 07/11/2021   Procedure: ABDOMINAL AORTOGRAM W/LOWER EXTREMITY;  Surgeon: Victorino Sparrow, MD;  Location[facility] INVASIVE CV LAB;  Service: Cardiovascular;  Laterality: N/A;   BACK SURGERY  1996    CARDIOVERSION N/A 02/15/2020   Procedure: CARDIOVERSION;  Surgeon: Elease Hashimoto Deloris Ping, MD;  Location: [facility] ENDOSCOPY;  Service: Cardiovascular;  Laterality: N/A;   CARDIOVERSION N/A 08/07/2021   Procedure: CARDIOVERSION;  Surgeon: Maisie Fus, MD;  Location: [facility] ENDOSCOPY;  Service: Cardiovascular;  Laterality: N/A;   CATARACT EXTRACTION  2000   CESAREAN SECTION     ENDARTERECTOMY FEMORAL Left 07/26/2021   Procedure: LEFT COMMON FEMORAL ENDARTERECTOMY;  Surgeon: Victorino Sparrow, MD;  Location: [facility] OR;  Service: Vascular;  Laterality: Left;   EYE SURGERY     FEMORAL BYPASS Left July 23, 2010   LUMBAR DISC SURGERY     [facility] ANGIOPLASTY Left 07/26/2021  Procedure: PATCH ANGIOPLASTY WITH XENOSURE BIOLOGIC 1 cm x 6 cm PATCH;  Surgeon: Victorino Sparrow, MD;  Location: [facility] OR;  Service: Vascular;  Laterality: Left;   SPINE SURGERY     TEE WITHOUT CARDIOVERSION N/A 08/07/2021   Procedure: TRANSESOPHAGEAL ECHOCARDIOGRAM (TEE);  Surgeon: Maisie Fus, MD;  Location: [facility] ENDOSCOPY;  Service: Cardiovascular;  Laterality: N/A;     Social History:   reports that she has never smoked. She has never used smokeless tobacco. She reports that she does not drink alcohol and does not use drugs.   Family History:  Her family history includes Alzheimer's disease in her sister and sister; Dementia in her sister; Diabetes in her brother, brother, father, sister, and sister; Heart attack in her brother, mother, and sister; Heart disease in her brother and sister; Hypertension in her father and mother; Stroke in her father and sister.   Allergies No Known Allergies   Home Medications  Prior to Admission medications   Medication Sig Start Date End Date Taking? Authorizing Provider  acetaminophen (TYLENOL) 325 MG tablet Take 650 mg by mouth every 6 (six) hours as needed for mild pain or headache.   Yes [provider]  amiodarone (PACERONE) 200 MG tablet Take 1 tablet (200 mg total) by mouth daily. 08/07/22  11/05/22 Yes [place], Eric J, DO  amLODipine (NORVASC) 10 MG tablet Take 1 tablet (10 mg total) by mouth daily. 06/26/22  Yes Lewayne Bunting, MD  atorvastatin (LIPITOR) 40 MG tablet Take 1 tablet (40 mg total) by mouth daily. Patient taking differently: Take 40 mg by mouth at bedtime. 03/22/20 09/21/2022 Yes [name] Asters, NP  carvedilol (COREG) 6.25 MG tablet Take 1 tablet (6.25 mg total) by mouth 2 (two) times daily with a meal. 08/07/22 11/05/22 Yes [place], Alvira Philips, DO  diclofenac Sodium (VOLTAREN) 1 % GEL Apply 2 g topically 2 (two) times daily as needed (for pain- affected areas).   Yes [provider]  docusate sodium (COLACE) 100 MG capsule Take 100 mg by mouth 2 (two) times daily.   Yes [provider]  ELIQUIS 2.5 MG TABS tablet TAKE 1 TABLET BY MOUTH TWICE A DAY Patient taking differently: Take 2.5 mg by mouth in the morning and at bedtime. 08/26/22  Yes Crenshaw, Madolyn Frieze, MD  ENTRESTO 49-51 MG Take 1 tablet by mouth 2 (two) times daily. 09/21/22  Yes [provider]  ezetimibe (ZETIA) 10 MG tablet Take 1 tablet (10 mg total) by mouth daily. Patient taking differently: Take 10 mg by mouth daily with lunch. 05/13/22  Yes Crenshaw, Madolyn Frieze, MD  furosemide (LASIX) 40 MG tablet Take 1 tablet (40 mg total) by mouth daily. Patient taking differently: Take 40 mg by mouth in the morning. 08/07/22 11/05/22 Yes [place], Eric J, DO  GINKGO BILOBA PO Take 2 tablets by mouth daily with lunch.   Yes [provider]  glipiZIDE (GLUCOTROL) 10 MG tablet Take 10 mg by mouth See admin instructions. Take 10 mg by mouth before breakfast and lunch 11/13/21  Yes [provider]  LANTUS SOLOSTAR 100 UNIT/ML Solostar Pen Inject 10 Units into the skin at bedtime. 06/15/22  Yes Berton Mount I, MD  latanoprost (XALATAN) 0.005 % ophthalmic solution Place 1 drop into both eyes at bedtime.   Yes [provider]  metFORMIN (GLUCOPHAGE) 500 MG tablet Take 1 tablet (500 mg  total) by mouth 2 (two) times daily. 06/15/22 09/27/2022 Yes Barnetta Chapel, MD  Multiple Vitamins-Minerals (CENTRUM  SILVER ULTRA WOMENS) TABS Take 1 tablet by mouth at bedtime.   Yes [provider]  Omega-3 Fatty Acids (FISH OIL) 1200 MG CAPS Take 1,200 mg by mouth daily with lunch.   Yes [provider]  ondansetron (ZOFRAN) 4 MG tablet Take 4 mg by mouth every 8 (eight) hours as needed for nausea or vomiting.   Yes [provider]  pantoprazole (PROTONIX) 40 MG tablet Take 40 mg by mouth daily before breakfast. 05/07/22  Yes [provider]  ACCU-CHEK SOFTCLIX LANCETS lancets  01/16/18   [provider]  iron polysaccharides (NIFEREX) 150 MG capsule Take 150 mg by mouth daily.    07/04/11  [provider]  topiramate (TOPAMAX) 15 MG capsule Take 15 mg by mouth 2 (two) times daily.    07/04/11  [provider]     Critical care time: 46 minutes  Coralyn Helling, MD [facility] Pulmonary/Critical Care Pager - [phone] or [phone] 10/11/2022, 4:11 PM

## 2022-10-14 NOTE — ED Notes (Signed)
This writer went into the pt's room to check on pt. Pt noted to be on bair hugger. This writer came back out of the room and noticed that on the monitor station the pt's heart rate was noted to be 36. This Clinical research associate went to get the charge nurse to make her aware and make ED Provider Trifan aware too.

## 2022-10-14 NOTE — Code Documentation (Signed)
Patient time of death occurred at 1637

## 2022-10-14 NOTE — ED Provider Notes (Signed)
Following admission to the ICU, was called to the room as the patient became rapidly hypotensive, bradycardic and then went into cardiac arrest.  The patient was in PEA arrest.  We performed several rounds of chest compressions, as well as epinephrine, started peripheral Levophed, and were able to achieve temporary ROSC, but subsequently the patient's blood pressure again plummeted and went into cardiac arrest.  Patient was given 2 L of fluid as an aggressive fluid bolus.  She was intubated for airway control.  She was also given bicarb, as well as additional chest compressions and multiple rounds of epinephrine.  Resuscitation was ultimately unsuccessful, and patient remained PEA arrest, and was declared deceased at 4:37 pm.  Her daughter was present in the room for this declaration.   Terald Sleeper, MD 10/11/2022 [phone]

## 2022-10-14 NOTE — ED Notes (Signed)
Code start at 1543 officially after pt determined to be pulseless and apneic.

## 2022-10-14 NOTE — ED Triage Notes (Signed)
Patient brought in by EMS from home with c/o hypotension and hyperglycemia. Per EMS daughter states patient CBG has been fluctuating. EMS gave 350cc of NS in 20g in L AC. 92/40 56

## 2022-10-14 NOTE — ED Provider Notes (Signed)
[facility] AT [facility] Provider Note   CSN: [id number] Arrival date & time: 10/02/2022  1322     History  Chief Complaint  Patient presents with   Hyperglycemia   Hypotension    [name] is a 89 y.o. female.  89 yo female brought in by EMS from home. Daughter called for history, states patient with vomiting last night and didn't get meds down, did not take PM insulin. Daughter went back to check on pt today, noted she was off her O2 (not normally on O2, went to cardiology yesterday who gave her O2 in the office for shallow breathing after walking, was sent home with O2). Seemed confused and stated she didn't feel good, daughter checked vitals- BP low, O2 92%, CBG was high and daughter gave her 10U lantus. Patient spilled her coffee this morning onto her lap on a towel, not injured. Daughter rechecked later and patient was confused, weak, seemed "tongue tied," CBG was 514 and so daughter called 911.  On Eliquis, no known falls.   Hx DM, HTN, HLD, a fib, CVA, DVT.       Home Medications Prior to Admission medications   Medication Sig Start Date End Date Taking? Authorizing Provider  acetaminophen (TYLENOL) 325 MG tablet Take 650 mg by mouth every 6 (six) hours as needed for mild pain or headache.   Yes [provider]  amiodarone (PACERONE) 200 MG tablet Take 1 tablet (200 mg total) by mouth daily. 08/07/22 11/05/22 Yes [place], Eric J, DO  amLODipine (NORVASC) 10 MG tablet Take 1 tablet (10 mg total) by mouth daily. 06/26/22  Yes Lewayne Bunting, MD  atorvastatin (LIPITOR) 40 MG tablet Take 1 tablet (40 mg total) by mouth daily. Patient taking differently: Take 40 mg by mouth at bedtime. 03/22/20 09/27/2022 Yes [name] Asters, NP  carvedilol (COREG) 6.25 MG tablet Take 1 tablet (6.25 mg total) by mouth 2 (two) times daily with a meal. 08/07/22 11/05/22 Yes [place], Alvira Philips, DO  diclofenac Sodium (VOLTAREN) 1 % GEL Apply 2 g topically 2  (two) times daily as needed (for pain- affected areas).   Yes [provider]  docusate sodium (COLACE) 100 MG capsule Take 100 mg by mouth 2 (two) times daily.   Yes [provider]  ELIQUIS 2.5 MG TABS tablet TAKE 1 TABLET BY MOUTH TWICE A DAY Patient taking differently: Take 2.5 mg by mouth in the morning and at bedtime. 08/26/22  Yes Crenshaw, Madolyn Frieze, MD  ENTRESTO 49-51 MG Take 1 tablet by mouth 2 (two) times daily. 09/21/22  Yes [provider]  ezetimibe (ZETIA) 10 MG tablet Take 1 tablet (10 mg total) by mouth daily. Patient taking differently: Take 10 mg by mouth daily with lunch. 05/13/22  Yes Crenshaw, Madolyn Frieze, MD  furosemide (LASIX) 40 MG tablet Take 1 tablet (40 mg total) by mouth daily. Patient taking differently: Take 40 mg by mouth in the morning. 08/07/22 11/05/22 Yes [place], Eric J, DO  GINKGO BILOBA PO Take 2 tablets by mouth daily with lunch.   Yes [provider]  glipiZIDE (GLUCOTROL) 10 MG tablet Take 10 mg by mouth See admin instructions. Take 10 mg by mouth before breakfast and lunch 11/13/21  Yes [provider]  LANTUS SOLOSTAR 100 UNIT/ML Solostar Pen Inject 10 Units into the skin at bedtime. 06/15/22  Yes Berton Mount I, MD  latanoprost (XALATAN) 0.005 % ophthalmic solution Place 1 drop into both eyes at  bedtime.   Yes [provider]  metFORMIN (GLUCOPHAGE) 500 MG tablet Take 1 tablet (500 mg total) by mouth 2 (two) times daily. 06/15/22 10/06/2022 Yes Barnetta Chapel, MD  Multiple Vitamins-Minerals (CENTRUM SILVER ULTRA WOMENS) TABS Take 1 tablet by mouth at bedtime.   Yes [provider]  Omega-3 Fatty Acids (FISH OIL) 1200 MG CAPS Take 1,200 mg by mouth daily with lunch.   Yes [provider]  ondansetron (ZOFRAN) 4 MG tablet Take 4 mg by mouth every 8 (eight) hours as needed for nausea or vomiting.   Yes [provider]  pantoprazole (PROTONIX) 40 MG tablet Take 40 mg by mouth daily before  breakfast. 05/07/22  Yes [provider]  ACCU-CHEK SOFTCLIX LANCETS lancets  01/16/18   [provider]  iron polysaccharides (NIFEREX) 150 MG capsule Take 150 mg by mouth daily.    07/04/11  [provider]  topiramate (TOPAMAX) 15 MG capsule Take 15 mg by mouth 2 (two) times daily.    07/04/11  [provider]      Allergies    Patient has no known allergies.    Review of Systems   Review of Systems Level 5 caveat for change in mental status  Physical Exam Updated Vital Signs BP (!) 96/57   Pulse (!) 54   Temp (!) 94.8 F (34.9 C) (Rectal)   Resp 20   Ht 5' (1.524 m)   Wt 44 kg   SpO2 97%   BMI 18.94 kg/m  Physical Exam Vitals and nursing note reviewed.  Constitutional:      Interventions: Nasal cannula in place.  HENT:     Head: Normocephalic and atraumatic.     Nose: Nose normal.     Mouth/Throat:     Mouth: Mucous membranes are dry.  Eyes:     Conjunctiva/sclera: Conjunctivae normal.  Cardiovascular:     Rate and Rhythm: Regular rhythm. Bradycardia present.     Heart sounds: Normal heart sounds.  Pulmonary:     Effort: Tachypnea present.     Comments: Respirations shallow Abdominal:     Palpations: Abdomen is soft.     Tenderness: There is no abdominal tenderness.  Skin:    General: Skin is warm and dry.  Neurological:     Mental Status: She is lethargic.     GCS: GCS eye subscore is 3. GCS verbal subscore is 4. GCS motor subscore is 5.     ED Results / Procedures / Treatments   Labs (all labs ordered are listed, but only abnormal results are displayed) Labs Reviewed  LACTIC ACID, PLASMA - Abnormal; Notable for the following components:      Result Value   Lactic Acid, Venous >9.0 (*)    All other components within normal limits  COMPREHENSIVE METABOLIC PANEL - Abnormal; Notable for the following components:   CO2 12 (*)    Glucose, Bld 461 (*)    BUN 44 (*)    Creatinine, Ser 2.04 (*)    Calcium 8.7 (*)    Total  Protein 6.2 (*)    Albumin 3.2 (*)    AST 267 (*)    ALT 193 (*)    Total Bilirubin 1.5 (*)    GFR, Estimated 23 (*)    Anion gap 20 (*)    All other components within normal limits  CBC WITH DIFFERENTIAL/PLATELET - Abnormal; Notable for the following components:   RBC 2.12 (*)    Hemoglobin 6.5 (*)  HCT 22.5 (*)    MCV 106.1 (*)    MCHC 28.9 (*)    RDW 18.3 (*)    nRBC 0.3 (*)    Lymphs Abs 0.5 (*)    All other components within normal limits  PROTIME-INR - Abnormal; Notable for the following components:   Prothrombin Time 21.7 (*)    INR 1.9 (*)    All other components within normal limits  URINALYSIS, W/ REFLEX TO CULTURE (INFECTION SUSPECTED) - Abnormal; Notable for the following components:   Color, Urine AMBER (*)    APPearance HAZY (*)    Glucose, UA >=500 (*)    Protein, ur 100 (*)    Bacteria, UA RARE (*)    All other components within normal limits  CULTURE, BLOOD (ROUTINE X 2)  CULTURE, BLOOD (ROUTINE X 2)  APTT  AMMONIA  LACTIC ACID, PLASMA  BRAIN NATRIURETIC PEPTIDE  TYPE AND SCREEN  TROPONIN I (HIGH SENSITIVITY)    EKG EKG Interpretation  Date/Time:  Saturday September 28 2022 13:58:20 EDT Ventricular Rate:  124 PR Interval:    QRS Duration: 74 QT Interval:  434 QTC Calculation: 776 R Axis:   82 Text Interpretation: Atrial fibrillation with rapid V-rate Paired ventricular premature complexes Borderline right axis deviation RSR' in V1 or V2, probably normal variant Confirmed by Alvester Chou [phone]) on 09/20/2022 2:00:43 PM  Radiology DG Chest Port 1 View  Result Date: 10/05/2022 CLINICAL DATA:  Sepsis.  Hypotension.  Hyperglycemia. EXAM: PORTABLE CHEST 1 VIEW COMPARISON:  08/06/2022 FINDINGS: Stable mild cardiomegaly. Moderate right and small left pleural effusions are seen. Bilateral lower lung airspace disease is present, which may be due to edema or pneumonia. No evidence of pneumothorax. IMPRESSION: Moderate right and small left pleural effusions.  Bilateral lower lung airspace disease, which may be due to edema or pneumonia. Electronically Signed   By: Danae Orleans M.D.   On: 10/12/2022 14:00    Procedures .Critical Care  Performed by: Jeannie Fend, PA-C Authorized by: Jeannie Fend, PA-C   Critical care provider statement:    Critical care time (minutes):  30   Critical care was time spent personally by me on the following activities:  Development of treatment plan with patient or surrogate, discussions with consultants, evaluation of patient's response to treatment, examination of patient, ordering and review of laboratory studies, ordering and review of radiographic studies, ordering and performing treatments and interventions, pulse oximetry, re-evaluation of patient's condition and review of old charts     Medications Ordered in ED Medications  azithromycin (ZITHROMAX) 500 mg in sodium chloride 0.9 % 250 mL IVPB (500 mg Intravenous New Bag/Given 10/10/2022 1436)  vancomycin (VANCOCIN) IVPB 1000 mg/200 mL premix (has no administration in time range)  sodium chloride 0.9 % bolus 1,000 mL (1,000 mLs Intravenous New Bag/Given 10/01/2022 1402)  cefTRIAXone (ROCEPHIN) 1 g in sodium chloride 0.9 % 100 mL IVPB (1 g Intravenous New Bag/Given 10/04/2022 1429)  sodium chloride 0.9 % bolus 1,000 mL (1,000 mLs Intravenous New Bag/Given 09/27/2022 1436)    ED Course/ Medical Decision Making/ A&P Clinical Course as of 09/18/2022 1509  Sat Sep 28, 2022  1453 89-year female presenting from home with concern for hyperglycemia, hypotension.  Patient is frail, chronically ill-appearing, history of diabetes as well as congestive heart failure.  She was hypotensive on arrival, does respond appropriately to questions, reports that she is not lightheaded or dizzy.  She has rhonchi on pulmonary exam.  She was also hypothermic per rectal  temperature.  Initial lactate returns elevated above 9.  Extremely concerning for severe sepsis.  X-ray concerning for potential  multifocal pneumonia.  Patient has been started on antibiotics, fluid boluses ordered.  Overall her work of breathing is normal, she is not requiring intubation at the moment.  Will consult with ICU team.  BP improving with fluid boluses [MT]    Clinical Course User Index [MT] Trifan, Kermit Balo, MD                             Medical Decision Making Amount and/or Complexity of Data Reviewed Labs: ordered. Radiology: ordered. ECG/medicine tests: ordered.   This patient presents to the ED for concern of hypotension and hyperglycemia, this involves an extensive number of treatment options, and is a complaint that carries with it a high risk of complications and morbidity.  The differential diagnosis includes but not limited to sepsis, DKA, UTI, PNA, gastroenteritis, gastritis, PUD, PE, acute cholecystitis, CVA   Co morbidities that complicate the patient evaluation  DM, CHF, a fib (on Eliquis), CVA, PAD, DVT, HTN, HLD   Additional history obtained:  Additional history obtained from EMS who contributes to history as above.  Also case discussed with [name], patient's adopted daughter and medical power of attorney. External records from outside source obtained and reviewed including echo dated 06/14/22 with EF 50-55%   Lab Tests:  I Ordered, and personally interpreted labs.  The pertinent results include: Lactic acid greater than 9.  Ammonia within normal limits.  Remaining labs pending at time of consultation to critical care at change of shift.   Imaging Studies ordered:  I ordered imaging studies including chest x-ray, CT head I independently visualized and interpreted imaging which showed right pleural effusion.  CT head pending I agree with the radiologist interpretation   Cardiac Monitoring: / EKG:  The patient was maintained on a cardiac monitor.  I personally viewed and interpreted the cardiac monitored which showed an underlying rhythm of: A-fib   Consultations  Obtained:  I requested consultation with the ER attending, Dr. [name],  and discussed lab and imaging findings as well as pertinent plan - they recommend: code sepsis, abx for pna based on CXR. Consult critical care for lactic >9, hypothermic, hypotensive.  Case discussed with critical care who will come and see the patient.   Problem List / ED Course / Critical interventions / Medication management  89 year old female brought in by EMS from home.  Patient arrives altered, confused verbal responses.  Able to maintain airway with O2 sats in the 70s on arrival, improved with 2 L nasal cannula.  Hypotensive, improved with IV fluids.  Hypothermic, placed under warmer.  Chest x-ray concerning for pleural effusion, consider pneumonia and placed on antibiotics. Call to patient's daughter and power of attorney who states that patient had vomiting last night, did not get her usual insulin and vomited her normal nighttime medications.  Daughter returned to check on patient this morning, noted that she was off of her oxygen (O2 was started after her visit to cardiology yesterday where she had dyspnea with exertion and was hypoxic).  Patient seemed confused and reported that she did not feel well.  Daughter checked her vitals, reported her blood pressure was low and her oxygen saturation was 92% with an elevated glucose.  Daughter gave patient 10 units of her Lantus.  Daughter returned this afternoon to check on patient again, found that her blood sugar  was 514, patient's speech was garbled and she appeared to be feeling worse which prompted her to call EMS. I ordered medication including IVF, broad spectrum abx  for sepsis, )2 for hypoxia  Reevaluation of the patient after these medicines showed that the patient BP improved I have reviewed the patients home medicines and have made adjustments as needed   Social Determinants of Health:  Lives alone   Test / Admission -  Considered:  admit         Final Clinical Impression(s) / ED Diagnoses Final diagnoses:  Sepsis, due to unspecified organism, unspecified whether acute organ dysfunction present (HCC)  Hypothermia, initial encounter  Pleural effusion  Hypotension, unspecified hypotension type  Lactic acid acidosis  Hypoxia  Anemia, unspecified type    Rx / DC Orders ED Discharge Orders     None         Jeannie Fend, PA-C 09/26/2022 1509    Terald Sleeper, MD 09/14/2022 1902

## 2022-10-14 NOTE — ED Provider Notes (Signed)
.  Critical Care  Performed by: Terald Sleeper, MD Authorized by: Terald Sleeper, MD   Critical care provider statement:    Critical care time (minutes):  60   Critical care time was exclusive of:  Separately billable procedures and treating other patients   Critical care was necessary to treat or prevent imminent or life-threatening deterioration of the following conditions:  Sepsis and shock   Critical care was time spent personally by me on the following activities:  Ordering and performing treatments and interventions, ordering and review of laboratory studies, ordering and review of radiographic studies, pulse oximetry, review of old charts, examination of patient and evaluation of patient's response to treatment Procedure Name: Intubation Date/Time: 10/01/2022 7:03 PM  Performed by: Terald Sleeper, MDPre-anesthesia Checklist: Patient identified, Patient being monitored, Emergency Drugs available, Timeout performed and Suction available Oxygen Delivery Method: Non-rebreather mask Preoxygenation: Pre-oxygenation with 100% oxygen Induction Type: Rapid sequence Ventilation: Mask ventilation without difficulty Tube size: 7.5 mm Number of attempts: 1 Placement Confirmation: ETT inserted through vocal cords under direct vision, CO2 detector and Breath sounds checked- equal and bilateral        Terald Sleeper, MD 09/22/2022 [phone]

## 2022-10-14 NOTE — Code Documentation (Signed)
Family at beside. Family given emotional support. 

## 2022-10-14 DEATH — deceased

## 2022-10-28 ENCOUNTER — Ambulatory Visit: Payer: Medicare HMO | Admitting: Cardiology

## 2024-03-23 IMAGING — MR MR HEAD W/O CM
6 of 11 series · 24 of 48 positions shown · non-contrast
Comparison: Prior CT from earlier the same day.

CLINICAL DATA: Initial evaluation for neuro deficit, stroke
suspected.

EXAM:
MRI HEAD WITHOUT CONTRAST
TECHNIQUE: Multiplanar, multiecho pulse sequences of the brain and surrounding
structures were obtained without intravenous contrast.

[Series 2: DWI · axial · 3.0mm · 0.94mm/px · z∈[-89,+46]mm · 7 of 99 slices shown (1 of 2)]
[im 1/99]
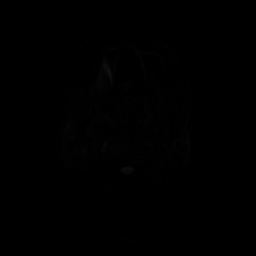
[im 17/99]
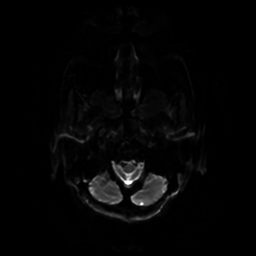
[im 33/99]
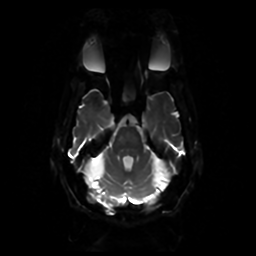
[im 50/99]
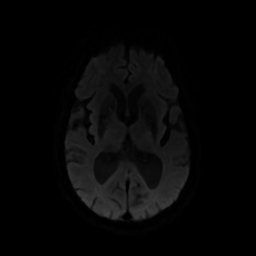
[im 66/99]
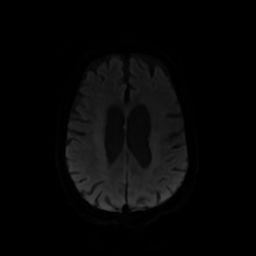
[im 82/99]
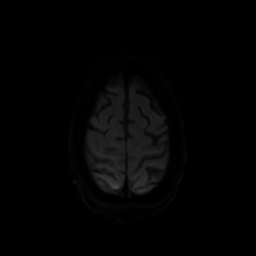
[im 99/99]
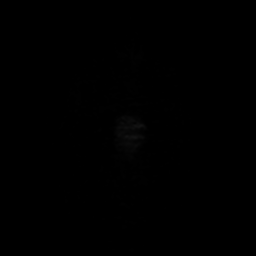

[Series 3: DWI · coronal · 4.0mm · 0.94mm/px · 5 of 74 slices shown (2 of 2)]
[im 1/74]
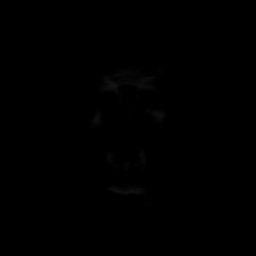
[im 19/74]
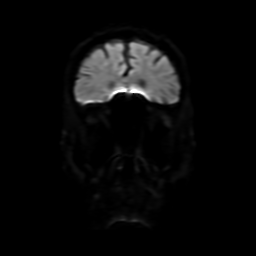
[im 37/74]
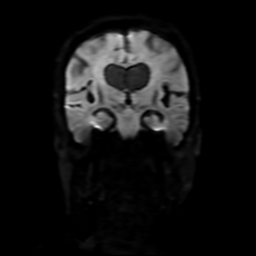
[im 55/74]
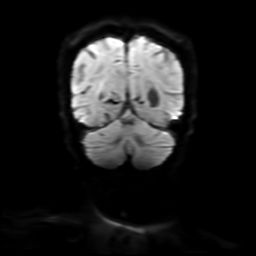
[im 74/74]
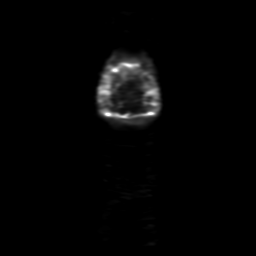

[Series 4: FLAIR · sagittal · 5.0mm · 0.23mm/px · 2 of 23 slices shown (1 of 2)]
[im 1/23]
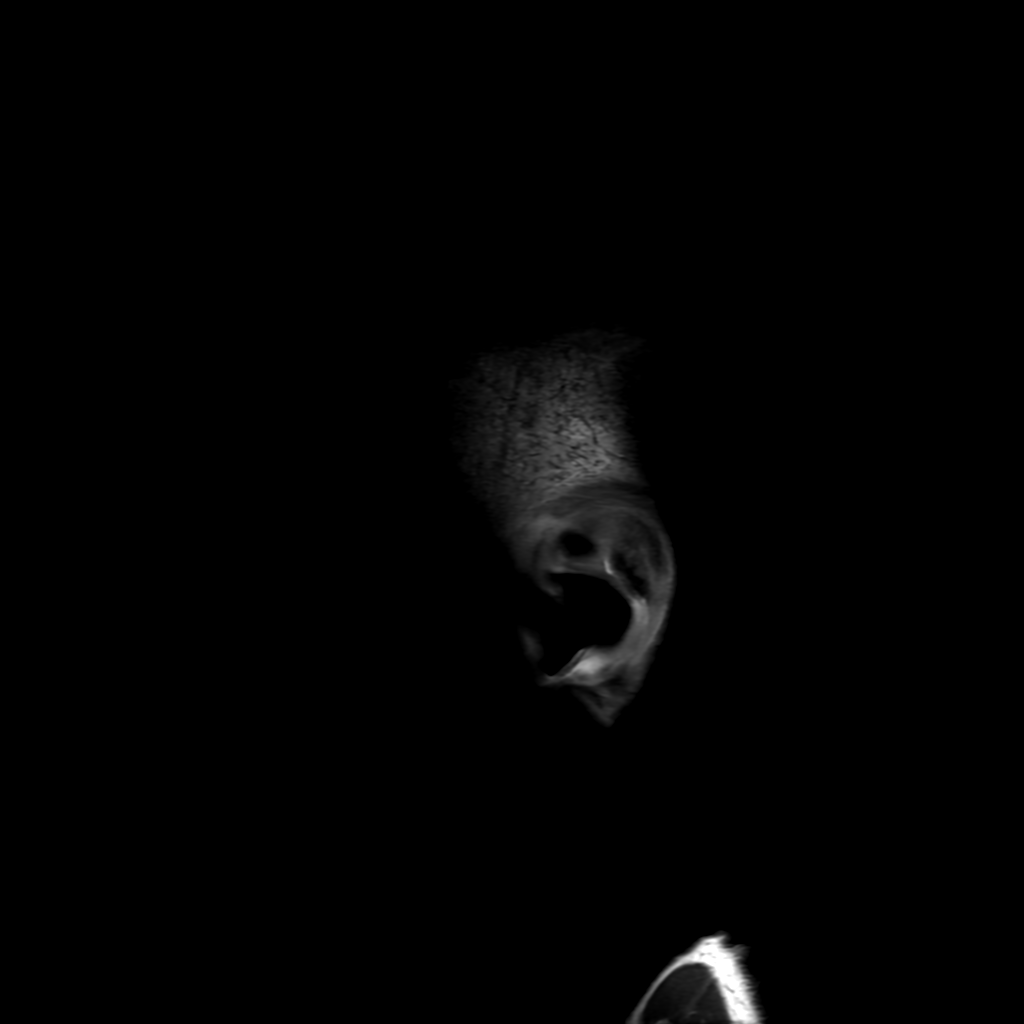
[im 23/23]
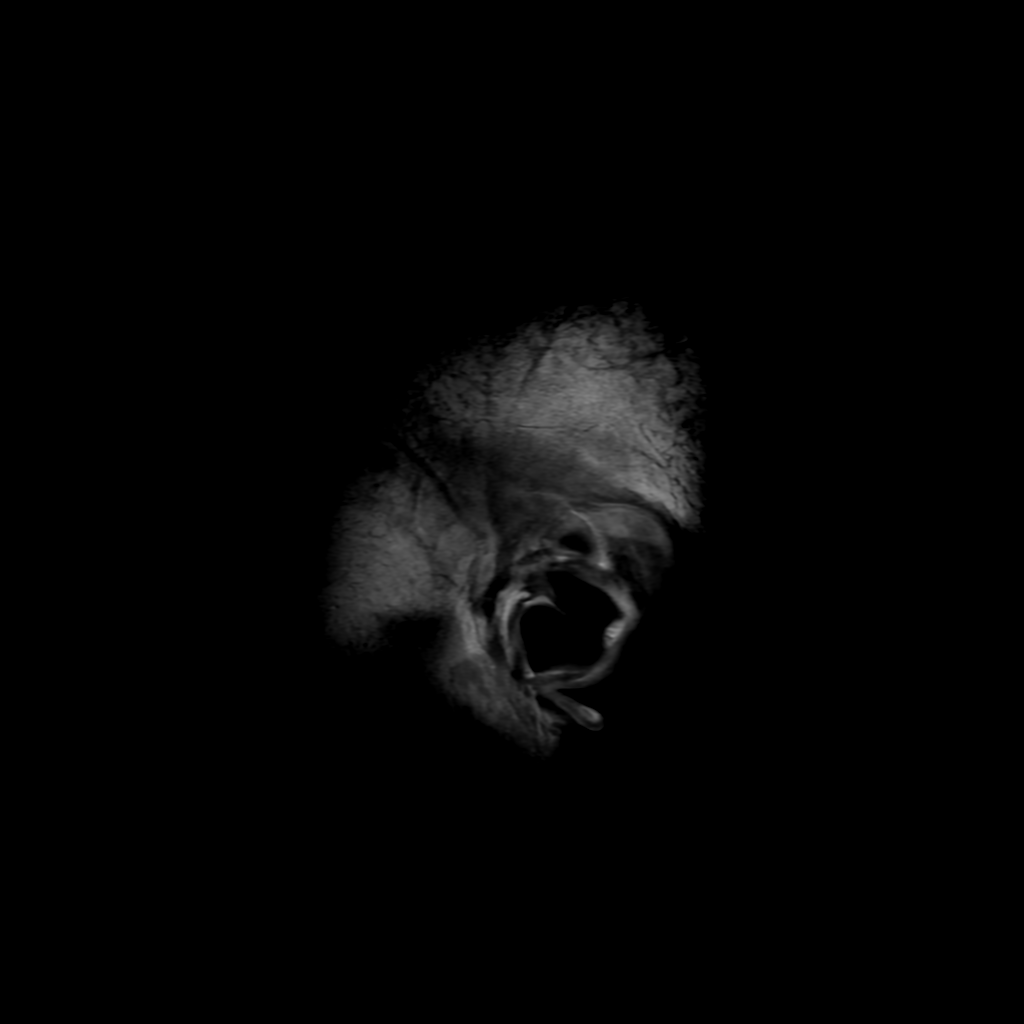

[Series 6: FLAIR · axial · 4.0mm · 0.45mm/px · z∈[-86,+53]mm · 3 of 35 slices shown (2 of 2)]
[im 1/35]
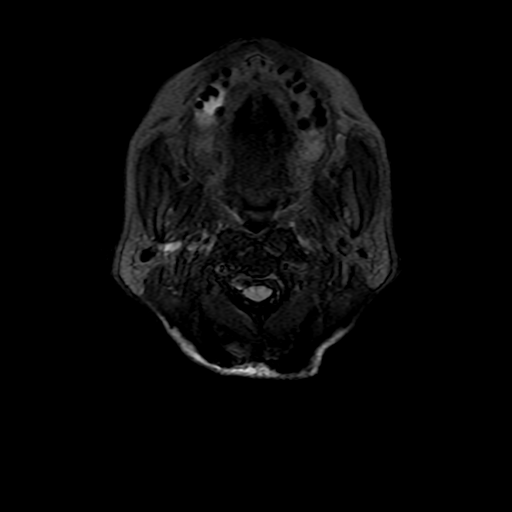
[im 18/35]
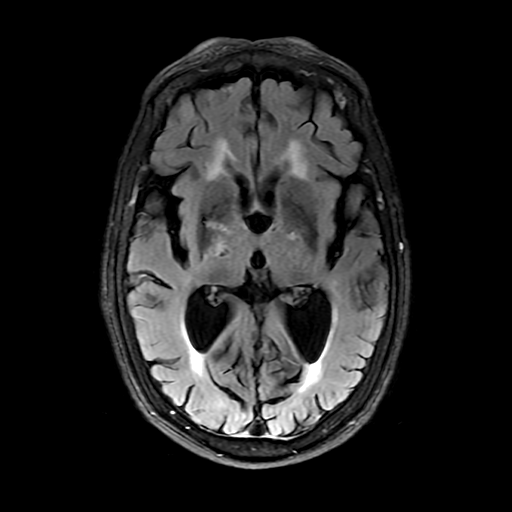
[im 35/35]
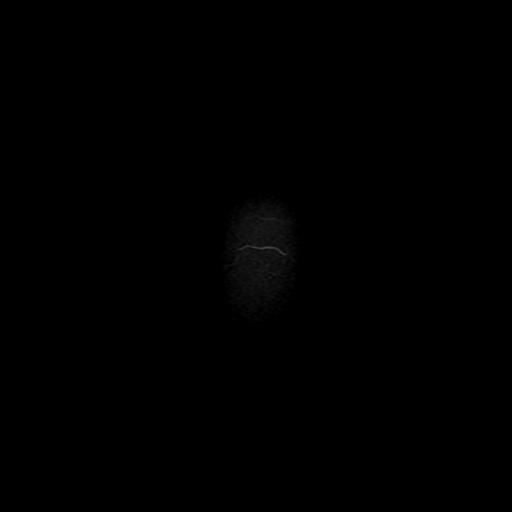

[Series 250: ADC · axial · 3.0mm · 0.94mm/px · z∈[-89,+46]mm · 4 of 50 slices shown (1 of 2)]
[im 1/50]
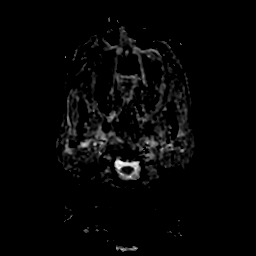
[im 17/50]
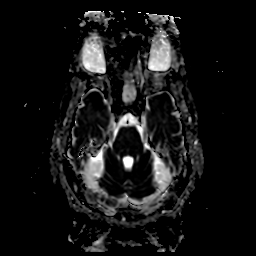
[im 33/50]
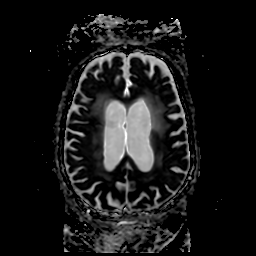
[im 50/50]
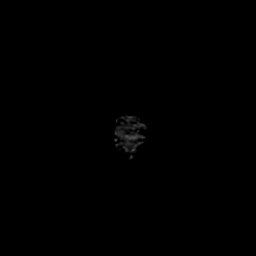

[Series 350: ADC · coronal · 4.0mm · 0.94mm/px · 3 of 36 slices shown (2 of 2)]
[im 1/36]
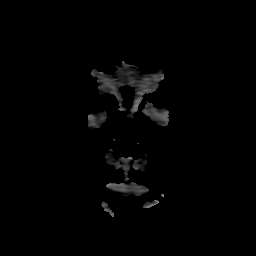
[im 18/36]
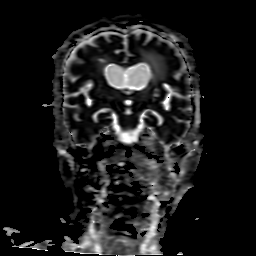
[im 36/36]
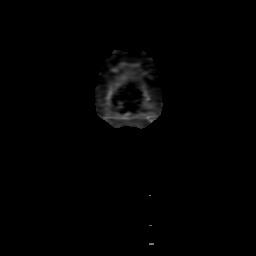

[24 of 48 positions shown; findings below may reference images not displayed]

FINDINGS: Brain: Cerebral volume within normal limits. Confluent T2/FLAIR
hyperintensity involving the periventricular and deep white matter
of both cerebral hemispheres as well as the pons, moderately
advanced in nature. Superimposed remote lacunar infarcts present
about the bilateral basal ganglia/corona radiata as well as the
thalami. Small remote left cerebellar infarct noted.

No abnormal foci of restricted diffusion to suggest acute or
subacute ischemia. Linear diffusion signal abnormality noted along
the midline of the posterior splenium noted, favored to be
artifactual. Gray-white matter differentiation otherwise maintained.
No other areas of chronic cortical infarction. No other acute or
chronic intracranial blood products.

No mass lesion, midline shift or mass effect. Ventricular prominence
related to global parenchymal volume loss of hydrocephalus. No
extra-axial fluid collection. Pituitary gland suprasellar region
within normal limits.

Vascular: Major intracranial vascular flow voids are maintained.

Skull and upper cervical spine: Degenerative changes noted about the
craniocervical junction. Bone marrow signal intensity normal. No
scalp soft tissue abnormality.

Sinuses/Orbits: Prior bilateral ocular lens replacement. Paranasal
sinuses are largely clear. Trace left mastoid effusion, of doubtful
significance.

Other: None.
IMPRESSION: 1. No acute intracranial abnormality.
2. Moderately advanced chronic microvascular ischemic disease with a
few scattered remote lacunar infarcts about the deep gray nuclei and
left cerebellum.
